# Patient Record
Sex: Male | Born: 1980 | Race: Asian | Hispanic: No | Marital: Married | Smoking: Current every day smoker
Health system: Southern US, Community
[De-identification: ages and names within clinical notes are randomized; demographics above are authoritative.]

## PROBLEM LIST (undated history)

## (undated) ENCOUNTER — Emergency Department (HOSPITAL_COMMUNITY): Admission: EM | Source: Home / Self Care

## (undated) VITALS — BP 114/66 | HR 59 | Temp 98.1°F | Resp 16

## (undated) DIAGNOSIS — F329 Major depressive disorder, single episode, unspecified: Secondary | ICD-10-CM

## (undated) DIAGNOSIS — F419 Anxiety disorder, unspecified: Secondary | ICD-10-CM

## (undated) DIAGNOSIS — F32A Depression, unspecified: Secondary | ICD-10-CM

## (undated) DIAGNOSIS — A159 Respiratory tuberculosis unspecified: Secondary | ICD-10-CM

## (undated) DIAGNOSIS — F99 Mental disorder, not otherwise specified: Secondary | ICD-10-CM

## (undated) DIAGNOSIS — Z5900 Homelessness unspecified: Secondary | ICD-10-CM

## (undated) DIAGNOSIS — F209 Schizophrenia, unspecified: Secondary | ICD-10-CM

---
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
## 2002-06-21 ENCOUNTER — Emergency Department (HOSPITAL_COMMUNITY): Admission: EM | Admit: 2002-06-21 | Discharge: 2002-06-21 | Payer: Self-pay | Admitting: Emergency Medicine

## 2010-08-20 ENCOUNTER — Emergency Department (HOSPITAL_COMMUNITY)
Admission: EM | Admit: 2010-08-20 | Discharge: 2010-08-20 | Disposition: A | Discharge status: Home / Self Care | Payer: Medicare Other | Attending: Emergency Medicine | Admitting: Emergency Medicine

## 2010-08-20 DIAGNOSIS — F329 Major depressive disorder, single episode, unspecified: Secondary | ICD-10-CM | POA: Insufficient documentation

## 2010-08-20 DIAGNOSIS — F209 Schizophrenia, unspecified: Secondary | ICD-10-CM | POA: Insufficient documentation

## 2010-08-20 DIAGNOSIS — F3289 Other specified depressive episodes: Secondary | ICD-10-CM | POA: Insufficient documentation

## 2010-08-20 DIAGNOSIS — F22 Delusional disorders: Secondary | ICD-10-CM | POA: Insufficient documentation

## 2010-08-20 DIAGNOSIS — F29 Unspecified psychosis not due to a substance or known physiological condition: Secondary | ICD-10-CM | POA: Insufficient documentation

## 2010-08-20 LAB — RAPID URINE DRUG SCREEN, HOSP PERFORMED
Amphetamines: NOT DETECTED
Benzodiazepines: NOT DETECTED
Tetrahydrocannabinol: NOT DETECTED

## 2010-08-20 LAB — COMPREHENSIVE METABOLIC PANEL
ALT: 59 U/L — ABNORMAL HIGH (ref 0–53)
AST: 34 U/L (ref 0–37)
Alkaline Phosphatase: 44 U/L (ref 39–117)
CO2: 26 mEq/L (ref 19–32)
Calcium: 9.4 mg/dL (ref 8.4–10.5)
GFR calc Af Amer: >60 mL/min (ref >60)
Glucose, Bld: 92 mg/dL (ref 70–99)
Potassium: 4 mEq/L (ref 3.5–5.1)
Sodium: 140 mEq/L (ref 135–145)
Total Protein: 7.6 g/dL (ref 6.0–8.3)

## 2010-08-20 LAB — CBC
HCT: 41.9 % (ref 39.0–52.0)
Hemoglobin: 14 g/dL (ref 13.0–17.0)
MCHC: 33.4 g/dL (ref 30.0–36.0)
RDW: 12.7 % (ref 11.5–15.5)
WBC: 5.5 10*3/uL (ref 4.0–10.5)

## 2010-08-20 LAB — DIFFERENTIAL
Basophils Absolute: 0 10*3/uL (ref 0.0–0.1)
Basophils Relative: 1 % (ref 0–1)
Lymphocytes Relative: 29 % (ref 12–46)
Monocytes Absolute: 0.5 10*3/uL (ref 0.1–1.0)
Neutro Abs: 3.2 10*3/uL (ref 1.7–7.7)

## 2012-09-11 ENCOUNTER — Encounter (HOSPITAL_COMMUNITY): Payer: Self-pay | Admitting: Emergency Medicine

## 2012-09-11 ENCOUNTER — Emergency Department (HOSPITAL_COMMUNITY)
Admission: EM | Admit: 2012-09-11 | Discharge: 2012-09-11 | Disposition: A | Discharge status: Home / Self Care | Payer: Medicare Other | Attending: Emergency Medicine | Admitting: Emergency Medicine

## 2012-09-11 DIAGNOSIS — F489 Nonpsychotic mental disorder, unspecified: Secondary | ICD-10-CM | POA: Insufficient documentation

## 2012-09-11 DIAGNOSIS — Z79899 Other long term (current) drug therapy: Secondary | ICD-10-CM | POA: Insufficient documentation

## 2012-09-11 DIAGNOSIS — N390 Urinary tract infection, site not specified: Secondary | ICD-10-CM | POA: Insufficient documentation

## 2012-09-11 DIAGNOSIS — R3915 Urgency of urination: Secondary | ICD-10-CM | POA: Insufficient documentation

## 2012-09-11 DIAGNOSIS — R300 Dysuria: Secondary | ICD-10-CM | POA: Insufficient documentation

## 2012-09-11 HISTORY — DX: Mental disorder, not otherwise specified: F99

## 2012-09-11 LAB — URINALYSIS, ROUTINE W REFLEX MICROSCOPIC
Bilirubin Urine: NEGATIVE
Ketones, ur: NEGATIVE mg/dL
Specific Gravity, Urine: 1.022 (ref 1.005–1.030)
pH: 6 (ref 5.0–8.0)

## 2012-09-11 LAB — BASIC METABOLIC PANEL
BUN: 15 mg/dL (ref 6–23)
Creatinine, Ser: 1 mg/dL (ref 0.50–1.35)
GFR calc non Af Amer: >90 mL/min (ref >90)
Glucose, Bld: 100 mg/dL — ABNORMAL HIGH (ref 70–99)
Potassium: 3.4 mEq/L — ABNORMAL LOW (ref 3.5–5.1)

## 2012-09-11 LAB — CBC WITH DIFFERENTIAL/PLATELET
Eosinophils Absolute: 0.1 10*3/uL (ref 0.0–0.7)
Eosinophils Relative: 1 % (ref 0–5)
HCT: 39.6 % (ref 39.0–52.0)
Hemoglobin: 13.1 g/dL (ref 13.0–17.0)
Lymphs Abs: 0.6 10*3/uL — ABNORMAL LOW (ref 0.7–4.0)
MCH: 26.6 pg (ref 26.0–34.0)
MCHC: 33.1 g/dL (ref 30.0–36.0)
MCV: 80.5 fL (ref 78.0–100.0)
Monocytes Absolute: 1.3 10*3/uL — ABNORMAL HIGH (ref 0.1–1.0)
Monocytes Relative: 10 % (ref 3–12)
Neutrophils Relative %: 85 % — ABNORMAL HIGH (ref 43–77)
RBC: 4.92 MIL/uL (ref 4.22–5.81)

## 2012-09-11 LAB — URINE MICROSCOPIC-ADD ON

## 2012-09-11 MED ORDER — CIPROFLOXACIN HCL 500 MG PO TABS: 500.0000 mg | ORAL_TABLET | Freq: Two times a day (BID) | ORAL | Status: DC

## 2012-09-11 MED ORDER — CIPROFLOXACIN HCL 500 MG PO TABS
500.0000 mg | ORAL_TABLET | Freq: Once | ORAL | Status: AC
  Administered 2012-09-11: 500 mg via ORAL
  Filled 2012-09-11: qty 1

## 2012-09-11 MED ORDER — PHENAZOPYRIDINE HCL 200 MG PO TABS: 200.0000 mg | ORAL_TABLET | Freq: Three times a day (TID) | ORAL | Status: DC

## 2012-09-11 NOTE — ED Provider Notes (Signed)
History     CSN: [id number]  Arrival date & time 09/11/12  0621   First MD Initiated Contact with Patient 09/11/12 [phone]      Chief Complaint  Patient presents with  . Abdominal Pain    (Consider location/radiation/quality/duration/timing/severity/associated sxs/prior treatment) Patient is a 32 y.o. male presenting with abdominal pain. The history is provided by the patient.  Abdominal Pain  patient here complaining of dysuria and suprapubic abdominal pain which has since resolved. Symptoms started 3 days ago. No flank pain or fever or vomiting. No prior history of same. Nothing makes her symptoms better. No associated back pain. Patient also notes some urinary urgency.  Past Medical History  Diagnosis Date  . Mental disorder     History reviewed. No pertinent past surgical history.  No family history on file.  History  Substance Use Topics  . Smoking status: Not on file  . Smokeless tobacco: Not on file  . Alcohol Use: No      Review of Systems  Gastrointestinal: Positive for abdominal pain.  All other systems reviewed and are negative.    Allergies  Review of patient's allergies indicates no known allergies.  Home Medications   Current Outpatient Rx  Name  Route  Sig  Dispense  Refill  . acetaminophen (TYLENOL) 500 MG tablet   Oral   Take 1,000 mg by mouth every 6 (six) hours as needed for pain.         . benztropine (COGENTIN) 2 MG tablet   Oral   Take 2 mg by mouth 2 (two) times daily.         Marland Kitchen gabapentin (NEURONTIN) 100 MG capsule   Oral   Take 100 mg by mouth 3 (three) times daily.         . ziprasidone (GEODON) 80 MG capsule   Oral   Take 80 mg by mouth 2 (two) times daily with a meal.           BP 111/57  Pulse 94  Temp(Src) 98.4 F (36.9 C) (Oral)  Resp 16  Wt 160 lb (72.576 kg)  SpO2 99%  Physical Exam  Nursing note and vitals reviewed. Constitutional: He is oriented to person, place, and time. He appears well-developed  and well-nourished.  Non-toxic appearance. No distress.  HENT:  Head: Normocephalic and atraumatic.  Eyes: Conjunctivae, EOM and lids are normal. Pupils are equal, round, and reactive to light.  Neck: Normal range of motion. Neck supple. No tracheal deviation present. No mass present.  Cardiovascular: Normal rate, regular rhythm and normal heart sounds.  Exam reveals no gallop.   No murmur heard. Pulmonary/Chest: Effort normal and breath sounds normal. No stridor. No respiratory distress. He has no decreased breath sounds. He has no wheezes. He has no rhonchi. He has no rales.  Abdominal: Soft. Normal appearance and bowel sounds are normal. He exhibits no distension. There is no tenderness. There is no rigidity, no rebound, no guarding and no CVA tenderness.  Musculoskeletal: Normal range of motion. He exhibits no edema and no tenderness.  Neurological: He is alert and oriented to person, place, and time. He has normal strength. No cranial nerve deficit or sensory deficit. GCS eye subscore is 4. GCS verbal subscore is 5. GCS motor subscore is 6.  Skin: Skin is warm and dry. No abrasion and no rash noted.  Psychiatric: His speech is normal and behavior is normal. His affect is blunt.    ED Course  Procedures (including critical care  time)  Labs Reviewed  URINALYSIS, ROUTINE W REFLEX MICROSCOPIC  CBC WITH DIFFERENTIAL  BASIC METABOLIC PANEL   No results found.   No diagnosis found.    MDM  Pt to be tx for his uti        Toy Baker, MD 09/11/12 [phone]

## 2012-09-11 NOTE — ED Notes (Signed)
Pt alert, arrives from home, c/o left lower abd, "stinky bowel movements", states pain occurs after bowel movement, onset the other day, resp even unlabored, skin pwd

## 2012-09-13 LAB — URINE CULTURE: Colony Count: >=100000

## 2012-09-15 NOTE — ED Notes (Signed)
+   Urine Patient treated with cipro-sensitive to same-chart appended per protocol MD. 

## 2013-12-29 ENCOUNTER — Emergency Department (HOSPITAL_COMMUNITY)
Admission: EM | Admit: 2013-12-29 | Discharge: 2013-12-29 | Disposition: A | Discharge status: Home / Self Care | Payer: Medicare Other

## 2013-12-29 NOTE — ED Notes (Signed)
Pt told registration that he is feeling better and is going to go home  Will return if needed

## 2014-07-30 DIAGNOSIS — F209 Schizophrenia, unspecified: Secondary | ICD-10-CM | POA: Diagnosis not present

## 2014-08-06 DIAGNOSIS — F209 Schizophrenia, unspecified: Secondary | ICD-10-CM | POA: Diagnosis not present

## 2014-10-16 DIAGNOSIS — E669 Obesity, unspecified: Secondary | ICD-10-CM | POA: Diagnosis not present

## 2014-10-16 DIAGNOSIS — E784 Other hyperlipidemia: Secondary | ICD-10-CM | POA: Diagnosis not present

## 2014-10-16 DIAGNOSIS — F209 Schizophrenia, unspecified: Secondary | ICD-10-CM | POA: Diagnosis not present

## 2014-10-16 DIAGNOSIS — E785 Hyperlipidemia, unspecified: Secondary | ICD-10-CM | POA: Diagnosis not present

## 2014-10-16 DIAGNOSIS — Z79899 Other long term (current) drug therapy: Secondary | ICD-10-CM | POA: Diagnosis not present

## 2014-10-16 DIAGNOSIS — R7302 Impaired glucose tolerance (oral): Secondary | ICD-10-CM | POA: Diagnosis not present

## 2014-11-19 DIAGNOSIS — F209 Schizophrenia, unspecified: Secondary | ICD-10-CM | POA: Diagnosis not present

## 2014-11-22 DIAGNOSIS — F209 Schizophrenia, unspecified: Secondary | ICD-10-CM | POA: Diagnosis not present

## 2014-12-14 DIAGNOSIS — F209 Schizophrenia, unspecified: Secondary | ICD-10-CM | POA: Diagnosis not present

## 2015-02-11 DIAGNOSIS — F209 Schizophrenia, unspecified: Secondary | ICD-10-CM | POA: Diagnosis not present

## 2015-02-25 DIAGNOSIS — Z79899 Other long term (current) drug therapy: Secondary | ICD-10-CM | POA: Diagnosis not present

## 2015-02-25 DIAGNOSIS — E785 Hyperlipidemia, unspecified: Secondary | ICD-10-CM | POA: Diagnosis not present

## 2015-02-25 DIAGNOSIS — F209 Schizophrenia, unspecified: Secondary | ICD-10-CM | POA: Diagnosis not present

## 2015-02-25 DIAGNOSIS — R7302 Impaired glucose tolerance (oral): Secondary | ICD-10-CM | POA: Diagnosis not present

## 2015-02-25 DIAGNOSIS — E669 Obesity, unspecified: Secondary | ICD-10-CM | POA: Diagnosis not present

## 2015-03-13 DIAGNOSIS — F209 Schizophrenia, unspecified: Secondary | ICD-10-CM | POA: Diagnosis not present

## 2015-03-26 ENCOUNTER — Encounter (HOSPITAL_COMMUNITY): Payer: Self-pay | Admitting: Emergency Medicine

## 2015-03-26 ENCOUNTER — Emergency Department (INDEPENDENT_AMBULATORY_CARE_PROVIDER_SITE_OTHER)
Admission: EM | Admit: 2015-03-26 | Discharge: 2015-03-26 | Disposition: A | Discharge status: Home / Self Care | Payer: Medicare Other | Source: Home / Self Care | Attending: Emergency Medicine | Admitting: Emergency Medicine

## 2015-03-26 DIAGNOSIS — M25561 Pain in right knee: Secondary | ICD-10-CM

## 2015-03-26 NOTE — Discharge Instructions (Signed)
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 This will likely continue to improve over the next 2 weeks. You can ice the knee or use ibuprofen as needed. If you are still having popping in your knee in 2-3 weeks, follow-up with sports medicine.

## 2015-03-26 NOTE — ED Notes (Signed)
C/o right knee pain onset 2 weeks... 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 No acute distress.

## 2015-03-26 NOTE — ED Provider Notes (Signed)
CSN: [id number]     Arrival date & time 03/26/15  1330 History   First MD Initiated Contact with Patient 03/26/15 1433     Chief Complaint  Patient presents with  . Knee Pain   (Consider location/radiation/quality/duration/timing/severity/associated sxs/prior Treatment) HPI  He is a 34 year old man here for evaluation of right knee pain. 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 He describes lifting a TV with all the pressure on his right leg. After this, he started having some discomfort in the right knee. This has gradually improved. Currently, he only describes discomfort first thing in the morning or if he has been sitting for a long time. He states in those situations when he flexes and extends his knee he will feel a pop in the front of the knee and have some brief discomfort. He denies any pain with walking. No swelling of the knee. He has been taking ibuprofen as needed with good improvement.  Past Medical History  Diagnosis Date  . Mental disorder    History reviewed. No pertinent past surgical history. No family history on file. Social History  Substance Use Topics  . Smoking status: Never Smoker   . Smokeless tobacco: None  . Alcohol Use: No    Review of Systems As in history of present illness Allergies  Review of patient's allergies indicates no known allergies.  Home Medications   Prior to Admission medications   Medication Sig Start Date End Date Taking? Authorizing Provider  benztropine (COGENTIN) 2 MG tablet Take 2 mg by mouth 2 (two) times daily.   Yes Historical Provider, MD  gabapentin (NEURONTIN) 100 MG capsule Take 100 mg by mouth 3 (three) times daily.   Yes Historical Provider, MD  ziprasidone (GEODON) 80 MG capsule Take 80 mg by mouth 2 (two) times daily with a meal.   Yes Historical Provider, MD  acetaminophen (TYLENOL) 500 MG tablet Take 1,000 mg by mouth every 6 (six) hours as needed for pain.     Historical Provider, MD   Meds Ordered and Administered this Visit  Medications - No data to display  BP 108/66 mmHg  Pulse 62  Temp(Src) 98 F (36.7 C) (Oral)  Resp 16  SpO2 97% No data found.   Physical Exam  Constitutional: He is oriented to person, place, and time. He appears well-developed and well-nourished. No distress.  Cardiovascular: Normal rate.   Pulmonary/Chest: Effort normal.  Musculoskeletal:  Right knee: No erythema or edema. No appreciable joint effusion. No tenderness of the patella, patellar tendon, quadriceps tendon, medial joint line, or lateral joint line. No joint laxity. Negative McMurray's. No pain with manipulation of the patella.  Neurological: He is alert and oriented to person, place, and time.    ED Course  Procedures (including critical care time)  Labs Review Labs Reviewed - No data to display  Imaging Review No results found.    MDM   1. Right knee pain    I suspect he either subluxed or otherwise injured the patellofemoral compartment. This has been improving. I suspect he will continue to improve over the next several weeks. Recommended ice or ibuprofen as needed. If things are not getting better in the next few weeks, he will follow-up with sports medicine.    Charm Rings, MD 03/26/15 [phone]

## 2015-04-02 ENCOUNTER — Inpatient Hospital Stay (HOSPITAL_COMMUNITY)
Admission: AD | Admit: 2015-04-02 | Discharge: 2015-04-08 | DRG: 885 | Disposition: A | Discharge status: Home / Self Care | Payer: Medicare Other | Attending: Psychiatry | Admitting: Psychiatry

## 2015-04-02 ENCOUNTER — Encounter (HOSPITAL_COMMUNITY): Payer: Self-pay | Admitting: *Deleted

## 2015-04-02 DIAGNOSIS — F1021 Alcohol dependence, in remission: Secondary | ICD-10-CM | POA: Diagnosis present

## 2015-04-02 DIAGNOSIS — F29 Unspecified psychosis not due to a substance or known physiological condition: Secondary | ICD-10-CM | POA: Diagnosis present

## 2015-04-02 DIAGNOSIS — F1221 Cannabis dependence, in remission: Secondary | ICD-10-CM | POA: Diagnosis present

## 2015-04-02 DIAGNOSIS — R45851 Suicidal ideations: Secondary | ICD-10-CM | POA: Diagnosis present

## 2015-04-02 DIAGNOSIS — F1290 Cannabis use, unspecified, uncomplicated: Secondary | ICD-10-CM | POA: Diagnosis not present

## 2015-04-02 DIAGNOSIS — F203 Undifferentiated schizophrenia: Secondary | ICD-10-CM | POA: Diagnosis not present

## 2015-04-02 DIAGNOSIS — F1099 Alcohol use, unspecified with unspecified alcohol-induced disorder: Secondary | ICD-10-CM | POA: Diagnosis not present

## 2015-04-02 HISTORY — DX: Major depressive disorder, single episode, unspecified: F32.9

## 2015-04-02 HISTORY — DX: Depression, unspecified: F32.A

## 2015-04-02 HISTORY — DX: Anxiety disorder, unspecified: F41.9

## 2015-04-02 LAB — CBC
HEMATOCRIT: 42 % (ref 39.0–52.0)
HEMOGLOBIN: 13.8 g/dL (ref 13.0–17.0)
MCH: 26.3 pg (ref 26.0–34.0)
MCHC: 32.9 g/dL (ref 30.0–36.0)
MCV: 80 fL (ref 78.0–100.0)
Platelets: 276 10*3/uL (ref 150–400)
RBC: 5.25 MIL/uL (ref 4.22–5.81)
RDW: 13.1 % (ref 11.5–15.5)
WBC: 8 10*3/uL (ref 4.0–10.5)

## 2015-04-02 LAB — COMPREHENSIVE METABOLIC PANEL
ALBUMIN: 5 g/dL (ref 3.5–5.0)
ALK PHOS: 52 U/L (ref 38–126)
ALT: 42 U/L (ref 17–63)
AST: 37 U/L (ref 15–41)
Anion gap: 9 (ref 5–15)
BILIRUBIN TOTAL: 0.6 mg/dL (ref 0.3–1.2)
BUN: 18 mg/dL (ref 6–20)
CALCIUM: 9.9 mg/dL (ref 8.9–10.3)
CO2: 28 mmol/L (ref 22–32)
Chloride: 102 mmol/L (ref 101–111)
Creatinine, Ser: 1.31 mg/dL — ABNORMAL HIGH (ref 0.61–1.24)
GFR calc Af Amer: >60 mL/min (ref >60)
GFR calc non Af Amer: >60 mL/min (ref >60)
Glucose, Bld: 88 mg/dL (ref 65–99)
Potassium: 4.4 mmol/L (ref 3.5–5.1)
Sodium: 139 mmol/L (ref 135–145)
TOTAL PROTEIN: 8.8 g/dL — AB (ref 6.5–8.1)

## 2015-04-02 LAB — RAPID URINE DRUG SCREEN, HOSP PERFORMED
Amphetamines: NOT DETECTED
BARBITURATES: NOT DETECTED
Benzodiazepines: NOT DETECTED
Cocaine: NOT DETECTED
Opiates: NOT DETECTED
Tetrahydrocannabinol: NOT DETECTED

## 2015-04-02 LAB — ETHANOL: Alcohol, Ethyl (B): <5 mg/dL (ref <5)

## 2015-04-02 MED ORDER — HYDROXYZINE HCL 25 MG PO TABS
25.0000 mg | ORAL_TABLET | Freq: Four times a day (QID) | ORAL | Status: DC | PRN
  Administered 2015-04-02 – 2015-04-03 (×2): 25 mg via ORAL
  Filled 2015-04-02 (×2): qty 1

## 2015-04-02 MED ORDER — ACETAMINOPHEN 325 MG PO TABS: 650.0000 mg | ORAL_TABLET | Freq: Four times a day (QID) | ORAL | Status: DC | PRN

## 2015-04-02 MED ORDER — INFLUENZA VAC SPLIT QUAD 0.5 ML IM SUSY
0.5000 mL | PREFILLED_SYRINGE | INTRAMUSCULAR | Status: AC
  Administered 2015-04-03: 0.5 mL via INTRAMUSCULAR
  Filled 2015-04-02: qty 0.5

## 2015-04-02 MED ORDER — OLANZAPINE 5 MG PO TBDP: 5.0000 mg | ORAL_TABLET | Freq: Three times a day (TID) | ORAL | Status: DC | PRN

## 2015-04-02 MED ORDER — MAGNESIUM HYDROXIDE 400 MG/5ML PO SUSP: 30.0000 mL | Freq: Every day | ORAL | Status: DC | PRN

## 2015-04-02 MED ORDER — TRAZODONE HCL 100 MG PO TABS
100.0000 mg | ORAL_TABLET | Freq: Every evening | ORAL | Status: DC | PRN
  Administered 2015-04-03 – 2015-04-04 (×2): 100 mg via ORAL
  Filled 2015-04-02 (×2): qty 1

## 2015-04-02 MED ORDER — ALUM & MAG HYDROXIDE-SIMETH 200-200-20 MG/5ML PO SUSP: 30.0000 mL | ORAL | Status: DC | PRN

## 2015-04-02 NOTE — Tx Team (Signed)
Initial Interdisciplinary Treatment Plan   PATIENT STRESSORS: Educational concerns Financial difficulties Marital or family conflict Medication change or noncompliance Occupational concerns Traumatic event   PATIENT STRENGTHS: Ability for insight Average or above average intelligence Capable of independent living Communication skills General fund of knowledge Motivation for treatment/growth Physical Health Religious Affiliation Supportive family/friends Work skills   PROBLEM LIST: Problem List/Patient Goals Date to be addressed Date deferred Reason deferred Estimated date of resolution  "suicidal thoughts" 04/02/2015   D/c  "panic" 04/02/2015   D/c  "depression" 04/02/2015   D/c  "anxiety" 04/02/2015   D/c                                 DISCHARGE CRITERIA:  Ability to meet basic life and health needs Improved stabilization in mood, thinking, and/or behavior Medical problems require only outpatient monitoring Motivation to continue treatment in a less acute level of care Need for constant or close observation no longer present Reduction of life-threatening or endangering symptoms to within safe limits Safe-care adequate arrangements made Verbal commitment to aftercare and medication compliance  PRELIMINARY DISCHARGE PLAN: Attend aftercare/continuing care group Attend PHP/IOP Outpatient therapy Participate in family therapy Return to previous living arrangement Return to previous work or school arrangements  PATIENT/FAMIILY INVOLVEMENT: This treatment plan has been presented to and reviewed with the patient, [name] [name].  The patient and family have been given the opportunity to ask questions and make suggestions.  Earline Mayotte 04/02/2015, 4:16 PM

## 2015-04-02 NOTE — Progress Notes (Signed)
Patient ID: [name] [name], male   DOB: [birth year], 34 y.o.   MRN: [id number] PER STATE REGULATIONS 482.30  THIS CHART WAS REVIEWED FOR MEDICAL NECESSITY WITH RESPECT TO THE PATIENT'S ADMISSION/DURATION OF STAY.  NEXT REVIEW DATE:04/06/15  Loura Halt, RN, BSN CASE MANAGER

## 2015-04-02 NOTE — Progress Notes (Signed)
Patient ID: [name] [name], male   DOB: 11-10-1980, 34 y.o.   MRN: [id number] Showering once on unit. He is anxious and states he is in a hurry and ready to get better. He states he has been feeling the way he does now for one year. He used to get help from outpatient services but hasnt had any in over one year. Reassured we were here to help him but getting better would take some time. He is pleasant and cooperative, and has an animated affect. Provided him items to shower with. He is able to contract for safety. Offered him Vistaril for anxiety and he took it with med ed.

## 2015-04-02 NOTE — Progress Notes (Addendum)
34 yr old male voluntary walk in.  Patient stated he was a patient at [facility] in 2001 and 2002.  Patient stated he drank alcohol once in awhile when he was 20-13 yrs old.  No alcohol since 2003, beer, cheap wine, liquor which was bought by friends.  Stated he was in jail a lot.  Stopped going to jail, reads his Bible, prays, started taking his medications.  Keeps to himself now.  THC use when he was 51-13 yrs old, no THC since age 54 yrs old.  Denied heroin use.  Cocaine long time ago, only taste cocaine.  Started smoking cigarettes at age [age] yrs until age of 70 yrs, no smoking for 11 yrs.  Denied all abuse.  Stated he had cholesterol problems in the past.  R knee pain occasionally, old work injury.  Works at [employer] since 2013, received disability check SSI.  Rated depression 3-4, anxiety 2, hopeless 5.  Hears voices occasionally, makes him feel so bad, think bad, wants to end his life, "wife may leave me, everybody against me, feel sad, angry, go off.  Want to take care of wife and family.  Go to church every Sunday, want people to pray for me, same thing over and over, normal things makes me feel suicidal, angry, sometimes want to hurt somebody, because I feel they are after me, sometimes hear things and that makes me mad.  Imagine kids cuss me, people laugh at me, try not to think about my depression.  Wife miscarried at 5 months pregnancy, about 5 months ago.  Wife sad and I was sad.  I did not hold child.  I walk away when angry.  Try to use coping skills.  Wanted to go to school, auto mechanics in 2011.  Had problems at school, low grades, problems studying, attention span.  Then took classes to manage my illness.  I'm not suicidal now but I do not have any money to buy a gun, talk to my friends that makes me feel better."  Denied HI during admission.  Denied A/V hallucinations during admission.  Denied pain.  Sometimes feels panicky, always thinks about past, would like to move to future, not be  stuck in past.  Sometimes voices in his head, voices are true.  Sees dreams and past.  Married no children.  Rated depression 3-4, anxiety 2, hopeless 5. Patient oriented to unit, given food and drink. Fall risk information given and discussed with patient, low fall risk. [employer] has black belt, blue case cell phone, keys, head phones, black wallet, gray cap, etc.

## 2015-04-02 NOTE — BH Assessment (Signed)
Assessment Note  [name] [name] is an 34 y.o. male that is a walk in at [facility].  Patient reports hearing and seeing things that scare him.  Patient reports that he was diagnosed with Schizophrenia and is compliant with taking his medication.  Patient reports that he receives medication management and therapy at [facility].  Patient reports having dreams that his sister broke his phone then he got mad at his sister and got into an argument with her.  Patient reports that he is not sure what he is dreaming is real     Patient reports feelings of paranoia.  Patient reports that he feels as if the police are after him,  Patient reports that since his wife miscarried 5 months ago he has begun to hear and see things more.  During the assessment the patient reports that he is afraid of what the voices are trying to tell him to do.  Patient declined to say what the voices are telling him to do.  Patient began tapping his head and stating that he just wants the voices to step.       Patient reports previous psychiatric hospitalizations at [facility], [facility] and [facility].  Patient denies HI and Substance Abuse.  Patient denies physical, sexual and emotional abuse.      Diagnosis:   Axis I: Paranoid Schizophrenia  Axis II: Deferred  Axis III Past Medical History:  Past Medical History  Diagnosis Date  . Mental disorder   Axis IV: economic problems, occupational problems, other psychosocial or environmental problems and problems with primary support group Axis V: GAF=30  No past surgical history on file.  Family History: No family history on file.  Social History:  reports that he has never smoked. He does not have any smokeless tobacco history on file. He reports that he does not drink alcohol or use illicit drugs.  Additional Social History:  Alcohol / Drug Use History of alcohol / drug use?: No history of alcohol / drug abuse  CIWA:   COWS:    Allergies: No Known Allergies  Home Medications:   (Not in a hospital admission)  OB/GYN Status:  No LMP for male patient.  General Assessment Data Location of Assessment:  (Walk In at [facility] ) TTS Assessment: In system Is this a Tele or Face-to-Face Assessment?: Face-to-Face Is this an Initial Assessment or a Re-assessment for this encounter?: Initial Assessment Marital status: Single Maiden name: NA Is patient pregnant?: No Pregnancy Status: No Living Arrangements: Spouse/significant other Can pt return to current living arrangement?: Yes Admission Status: Voluntary Is patient capable of signing voluntary admission?: Yes Referral Source: Self/Family/Friend Insurance type: Medicare  Medical Screening Exam [facility] Walk-in ONLY) Medical Exam completed: Yes  Crisis Care Plan Living Arrangements: Spouse/significant other Name of Psychiatrist: Vesta Mixer Name of Therapist: [facility]   Education Status Is patient currently in school?: No Current Grade: NA Highest grade of school patient has completed: NA Name of school: NA Contact person: NA  Risk to self with the past 6 months Suicidal Ideation: Yes-Currently Present Has patient been a risk to self within the past 6 months prior to admission? : Yes Suicidal Intent: No Has patient had any suicidal intent within the past 6 months prior to admission? : Yes Is patient at risk for suicide?: Yes Suicidal Plan?: No Has patient had any suicidal plan within the past 6 months prior to admission? : Yes Access to Means: No What has been your use of drugs/alcohol within the last 12  months?: None Reported Previous Attempts/Gestures: Yes How many times?: 3 Other Self Harm Risks: None Reported Triggers for Past Attempts: Other (Comment) (Command voices) Intentional Self Injurious Behavior: None Family Suicide History: No Recent stressful life event(s): Loss (Comment) (Wife lost his baby.) Persecutory voices/beliefs?: Yes Depression: Yes Depression Symptoms: Despondent, Tearfulness, Fatigue,  Guilt, Loss of interest in usual pleasures Substance abuse history and/or treatment for substance abuse?: No Suicide prevention information given to non-admitted patients: Yes  Risk to Others within the past 6 months Homicidal Ideation: No Does patient have any lifetime risk of violence toward others beyond the six months prior to admission? : No Thoughts of Harm to Others: No Current Homicidal Intent: No Current Homicidal Plan: No Access to Homicidal Means: No Identified Victim: NA History of harm to others?: No Assessment of Violence: None Noted Violent Behavior Description: NA Does patient have access to weapons?: No Criminal Charges Pending?: No Does patient have a court date: No Is patient on probation?: No  Psychosis Hallucinations: Auditory, Visual Delusions: Grandiose (Believes that people are following her. )  Mental Status Report Appearance/Hygiene: Disheveled Eye Contact: Fair Motor Activity: Freedom of movement Speech: Logical/coherent Level of Consciousness: Alert Mood: Depressed, Anxious, Suspicious, Fearful Affect: Frightened, Fearful, Blunted Anxiety Level: Moderate Thought Processes: Coherent, Relevant Judgement: Unimpaired Orientation: Place, Person, Time, Situation Obsessive Compulsive Thoughts/Behaviors: None  Cognitive Functioning Concentration: Decreased Memory: Remote Intact, Recent Intact IQ: Average Level of Function: NA Insight: Poor Impulse Control: Poor Appetite: Fair Weight Loss: 0 Weight Gain: 0 Sleep: Decreased Total Hours of Sleep: 4 Vegetative Symptoms: None  ADLScreening [facility] Assessment Services) Patient's cognitive ability adequate to safely complete daily activities?: Yes Patient able to express need for assistance with ADLs?: Yes Independently performs ADLs?: Yes (appropriate for developmental age)  Prior Inpatient Therapy Prior Inpatient Therapy: Yes Prior Therapy Dates: 2001, 2003, 2005 Prior Therapy  Facilty/Provider(s): [facility]; [facility]; [facility] Reason for Treatment: Psychosis; SI  Prior Outpatient Therapy Prior Outpatient Therapy: Yes Prior Therapy Dates: Ongoing  Prior Therapy Facilty/Provider(s): [facility] Reason for Treatment: Medication Management Does patient have an ACCT team?: No Does patient have Intensive In-House Services?  : No Does patient have [facility] services? : No Does patient have P4CC services?: No  ADL Screening (condition at time of admission) Patient's cognitive ability adequate to safely complete daily activities?: Yes Is the patient deaf or have difficulty hearing?: No Does the patient have difficulty seeing, even when wearing glasses/contacts?: No Does the patient have difficulty concentrating, remembering, or making decisions?: Yes Patient able to express need for assistance with ADLs?: Yes Does the patient have difficulty dressing or bathing?: No Independently performs ADLs?: Yes (appropriate for developmental age) Does the patient have difficulty walking or climbing stairs?: No Weakness of Legs: None Weakness of Arms/Hands: None  Home Assistive Devices/Equipment Home Assistive Devices/Equipment: None    Abuse/Neglect Assessment (Assessment to be complete while patient is alone) Physical Abuse: Denies Verbal Abuse: Denies Sexual Abuse: Denies Exploitation of patient/patient's resources: Denies Self-Neglect: Denies Values / Beliefs Cultural Requests During Hospitalization: None Spiritual Requests During Hospitalization: None Consults Spiritual Care Consult Needed: No Social Work Consult Needed: No Merchant navy officer (For Healthcare) Does patient have an advance directive?: No Would patient like information on creating an advanced directive?: No - patient declined information    Additional Information 1:1 In Past 12 Months?: No CIRT Risk: No Elopement Risk: No     Disposition:  Disposition Initial Assessment Completed for this  Encounter: Yes Disposition of Patient: Inpatient treatment program Type  of inpatient treatment program: Adult  On Site Evaluation by:   Reviewed with Physician:    Phillip Heal LaVerne 04/02/2015 1:40 PM

## 2015-04-02 NOTE — Progress Notes (Signed)
D   Pt is pleasant on approach and cooperative  Pt is very focused on his wife leaving him and about things in his past   He interacts minimally with others and did not attend group this evening A   Verbal support given   Medications administered and effectiveness monitored   Q 15 min checks R   Pt safe at present

## 2015-04-03 ENCOUNTER — Encounter (HOSPITAL_COMMUNITY): Payer: Self-pay | Admitting: Psychiatry

## 2015-04-03 DIAGNOSIS — F203 Undifferentiated schizophrenia: Secondary | ICD-10-CM | POA: Diagnosis not present

## 2015-04-03 DIAGNOSIS — F1221 Cannabis dependence, in remission: Secondary | ICD-10-CM | POA: Diagnosis present

## 2015-04-03 DIAGNOSIS — F1021 Alcohol dependence, in remission: Secondary | ICD-10-CM | POA: Diagnosis present

## 2015-04-03 DIAGNOSIS — F1099 Alcohol use, unspecified with unspecified alcohol-induced disorder: Secondary | ICD-10-CM

## 2015-04-03 MED ORDER — CITALOPRAM HYDROBROMIDE 10 MG PO TABS
10.0000 mg | ORAL_TABLET | Freq: Every day | ORAL | Status: DC
  Administered 2015-04-03 – 2015-04-05 (×3): 10 mg via ORAL
  Filled 2015-04-03 (×4): qty 1

## 2015-04-03 MED ORDER — BENZTROPINE MESYLATE 0.5 MG PO TABS
0.5000 mg | ORAL_TABLET | Freq: Every day | ORAL | Status: DC
  Administered 2015-04-04 – 2015-04-07 (×5): 0.5 mg via ORAL
  Filled 2015-04-03 (×7): qty 1

## 2015-04-03 MED ORDER — ARIPIPRAZOLE 5 MG PO TABS
2.5000 mg | ORAL_TABLET | Freq: Once | ORAL | Status: AC
  Administered 2015-04-03: 2.5 mg via ORAL
  Filled 2015-04-03: qty 1

## 2015-04-03 MED ORDER — ARIPIPRAZOLE 5 MG PO TABS
5.0000 mg | ORAL_TABLET | Freq: Every day | ORAL | Status: DC
  Administered 2015-04-04: 5 mg via ORAL
  Filled 2015-04-03 (×3): qty 1

## 2015-04-03 NOTE — [facility] Group Notes (Signed)
[facility] LCSW Group Therapy  04/03/2015 3:15 PM  Type of Therapy: Group Therapy  Participation Level: Active  Participation Quality: Attentive  Affect: Flat  Cognitive: Oriented  Insight: Limited  Engagement in Therapy: Engaged  Modes of Intervention: Discussion and Socialization  Summary of Progress/Problems: [name] [name] from [facility] [employer] was here to tell his story of recovery and play his guitar. Pt was alert and pleasant throughout the duration of the group.  Vito Backers. Beverely Pace 04/03/2015 3:15 PM

## 2015-04-03 NOTE — Progress Notes (Signed)
Adult Psychoeducational Group Note  Date:  04/03/2015 Time:  8:25 PM  Group Topic/Focus:  Wrap-Up Group:   The focus of this group is to help patients review their daily goal of treatment and discuss progress on daily workbooks.  Participation Level:  Active  Participation Quality:  Appropriate  Affect:  Appropriate  Cognitive:  Appropriate  Insight: Good  Engagement in Group:  Engaged  Modes of Intervention:  Discussion  Additional Comments:  Pt rated overall day a 7 out of 10. Pt reported that his family visiting him today was the highlight of his day. Pt mentioned that he did not eat very much today. Pt also reported that his goal for the day was to take his medication, which he feels that he achieved.   Cleotilde Neer 04/03/2015, 9:56 PM

## 2015-04-03 NOTE — Progress Notes (Signed)
D   Pt is pleasant on approach and cooperative  Pt is very focused on his wife leaving him and about things in his past   He interacts minimally with others and did not attend group this evening   Pt said he saw designs behind his eyelids when he closed them when he was in his room and complained of feeling a little dizzy A   Verbal support given   Medications administered and effectiveness monitored   Q 15 min checks  Provided gator aid for pt to help with dizziness  Reinforced fall precautions R   Pt safe at present   Pt verbalized understanding

## 2015-04-03 NOTE — [facility] Group Notes (Signed)
[facility] LCSW Aftercare Discharge Planning Group Note   04/03/2015 11:47 AM  Participation Quality:  Engaged  Mood/Affect:  Blunted  Depression Rating:  "high"  Anxiety Rating:  "high"  Thoughts of Suicide:  No Will you contract for safety?   NA  Current AVH:  Yes  Plan for Discharge/Comments:  Earon talked about how he was hoping to be healed at church, and as a result decided to stop taking his meds.  Rather difficult to follow.  He is married and employed at [employer].  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    Transportation Means: family  Supports: family  [place], Baldo Daub

## 2015-04-03 NOTE — H&P (Signed)
Psychiatric Admission Assessment Adult  Patient Identification: [name] [name] MRN:  [id number] Date of Evaluation:  04/03/2015 Chief Complaint: " I am thinking a lot.'   Principal Diagnosis: Undifferentiated schizophrenia Diagnosis:   Patient Active Problem List   Diagnosis Date Noted  . Undifferentiated schizophrenia [F20.3] 04/03/2015  . Cannabis use disorder, moderate, in sustained remission [F12.90] 04/03/2015  . Alcohol use disorder, moderate, in sustained remission [F10.99] 04/03/2015     History of Present Illness::[name] [name] is a 34 y.o.Asian male, married , on SSD , has a past hx of schizophrenia , presented as a walk in at [facility] for worsening sx. Patient per initial evaluation in EHR 'reported hearing and seeing things that scare him. Patient also reported that he was diagnosed with Schizophrenia and is compliant with taking his medication. Patient reported that he receives medication management and therapy at [facility]. Patient also reported feeling paranoid. Patient reported that he feels as if the police are after him. His wife miscarried few months ago and his symptoms worsened."  Patient seen and chart reviewed.Discussed patient with treatment team. Pt seen as withdrawn , but calm. Pt reports a lot of racing thoughts and that he hears these thoughts - they are not real voices , but thoughts , very hard to explain. Pt reports that he also feels more depressed, anxious as well as has sleep issues and appetite changes. His wife had a still born baby last Nov 2015. They buried the baby who had congenital defect , and ever since then his sx has been worsening.  Pt reports being diagnosed with schizophrenia in 2001. Pt had been tried on geodon, cogentin, trazodone. He does not feel his medications are working for him any more. Pt has had multiple hospitalizations - [facility], [facility] . Pt denies any suicide attempts.  Pt reports hx of abusing cannabis /alcohol - but has been sober since  2004.  Pt reports he is on SSD and also works at [employer] off and on.    Associated Signs/Symptoms: Depression Symptoms:  anhedonia, psychomotor retardation, difficulty concentrating, anxiety, disturbed sleep, decreased appetite, (Hypo) Manic Symptoms:  denies Anxiety Symptoms:  unspecified anxiety sx Psychotic Symptoms:  Hallucinations: Auditory Paranoia, PTSD Symptoms: Negative Total Time spent with patient: 45 minutes  Past Psychiatric History: Pt reports being diagnosed with schizophrenia in 2001. Pt had been tried on geodon, cogentin, trazodone. He does not feel his medications are working for him any more. Pt has had multiple hospitalizations - [facility], [facility] . Pt denies any suicide attempts.   Risk to Self: Suicidal Ideation: Yes-Currently Present Suicidal Intent: No Is patient at risk for suicide?: Yes Suicidal Plan?: No Access to Means: No What has been your use of drugs/alcohol within the last 12 months?: None Reported How many times?: 3 Other Self Harm Risks: None Reported Triggers for Past Attempts: Other (Comment) (Command voices) Intentional Self Injurious Behavior: None Risk to Others: Homicidal Ideation: No Thoughts of Harm to Others: No Current Homicidal Intent: No Current Homicidal Plan: No Access to Homicidal Means: No Identified Victim: NA History of harm to others?: No Assessment of Violence: None Noted Violent Behavior Description: NA Does patient have access to weapons?: No Criminal Charges Pending?: No Does patient have a court date: No Prior Inpatient Therapy: Prior Inpatient Therapy: Yes Prior Therapy Dates: 2001, 2003, 2005 Prior Therapy Facilty/Provider(s): [place]; Doretha Dicks; [facility] Reason for Treatment: Psychosis; SI Prior Outpatient Therapy: Prior Outpatient Therapy: Yes Prior Therapy Dates: Ongoing  Prior Therapy Facilty/Provider(s): [facility] Reason  for Treatment: Medication Management Does patient have an ACCT team?:  No Does patient have Intensive In-House Services?  : No Does patient have [facility] services? : No Does patient have [facility] services?: No  Alcohol Screening: 1. How often do you have a drink containing alcohol?: Never 9. Have you or someone else been injured as a result of your drinking?: No 10. Has a relative or friend or a doctor or another health worker been concerned about your drinking or suggested you cut down?: No Alcohol Use Disorder Identification Test Final Score (AUDIT): 0 Brief Intervention: AUDIT score less than 7 or less-screening does not suggest unhealthy drinking-brief intervention not indicated Substance Abuse History in the last 12 months:  No. Consequences of Substance Abuse: Negative Previous Psychotropic Medications: Yes - see above for details. Psychological Evaluations: No  Past Medical History:  Past Medical History  Diagnosis Date  . Mental disorder   . Anxiety   . Depression    History reviewed. No pertinent past surgical history. Family History:  Family History  Problem Relation Age of Onset  . Mental illness Neg Hx    Family Psychiatric  History: denies mental illness /drug abuse in family.  Social History:  History  Alcohol Use No    Comment: denied all drug/alcohol use at this time     History  Drug Use No    Comment: denied all drug /alcohol use at this time    Social History   Social History  . Marital Status: Single    Spouse Name: N/A  . Number of Children: N/A  . Years of Education: N/A   Social History Main Topics  . Smoking status: Never Smoker   . Smokeless tobacco: None  . Alcohol Use: No     Comment: denied all drug/alcohol use at this time  . Drug Use: No     Comment: denied all drug /alcohol use at this time  . Sexual Activity: Yes    Birth Control/ Protection: None   Other Topics Concern  . None   Social History Narrative   Additional Social History:    Pain Medications: tylenol  Prescriptions: cogentin   neurotin    geodon Over the Counter: tylenol History of alcohol / drug use?: Yes Longest period of sobriety (when/how long): 13 years Negative Consequences of Use: Financial Withdrawal Symptoms: Other (Comment) (denied withdrawals in past 13 yrs)          Patient was born in [place] . Pt came to [place] at the age of 60. Pt got a GED as well as did Estate agent course in college and did several christian courses. Pt is married. Has a good support system from family. Pt does report a hx of being in prison in the past. Denies pending charges.          Allergies:  No Known Allergies Lab Results:  Results for orders placed or performed during the hospital encounter of 04/02/15 (from the past 48 hour(s))  Urine rapid drug screen (hosp performed)not at [facility]     Status: None   Collection Time: 04/02/15  5:14 PM  Result Value Ref Range   Opiates NONE DETECTED NONE DETECTED   Cocaine NONE DETECTED NONE DETECTED   Benzodiazepines NONE DETECTED NONE DETECTED   Amphetamines NONE DETECTED NONE DETECTED   Tetrahydrocannabinol NONE DETECTED NONE DETECTED   Barbiturates NONE DETECTED NONE DETECTED    Comment:        DRUG SCREEN FOR MEDICAL PURPOSES ONLY.  IF  CONFIRMATION IS NEEDED FOR ANY PURPOSE, NOTIFY LAB WITHIN 5 DAYS.        LOWEST DETECTABLE LIMITS FOR URINE DRUG SCREEN Drug Class       Cutoff (ng/mL) Amphetamine      1000 Barbiturate      200 Benzodiazepine   213 Tricyclics       086 Opiates          300 Cocaine          300 THC              50 Performed at [facility]   CBC     Status: None   Collection Time: 04/02/15  7:42 PM  Result Value Ref Range   WBC 8.0 4.0 - 10.5 K/uL   RBC 5.25 4.22 - 5.81 MIL/uL   Hemoglobin 13.8 13.0 - 17.0 g/dL   HCT 42.0 39.0 - 52.0 %   MCV 80.0 78.0 - 100.0 fL   MCH 26.3 26.0 - 34.0 pg   MCHC 32.9 30.0 - 36.0 g/dL   RDW 13.1 11.5 - 15.5 %   Platelets 276 150 - 400 K/uL    Comment: Performed at [facility]  Comprehensive metabolic panel     Status: Abnormal   Collection Time: 04/02/15  7:42 PM  Result Value Ref Range   Sodium 139 135 - 145 mmol/L   Potassium 4.4 3.5 - 5.1 mmol/L   Chloride 102 101 - 111 mmol/L   CO2 28 22 - 32 mmol/L   Glucose, Bld 88 65 - 99 mg/dL   BUN 18 6 - 20 mg/dL   Creatinine, Ser 1.31 (H) 0.61 - 1.24 mg/dL   Calcium 9.9 8.9 - 10.3 mg/dL   Total Protein 8.8 (H) 6.5 - 8.1 g/dL   Albumin 5.0 3.5 - 5.0 g/dL   AST 37 15 - 41 U/L   ALT 42 17 - 63 U/L   Alkaline Phosphatase 52 38 - 126 U/L   Total Bilirubin 0.6 0.3 - 1.2 mg/dL   GFR calc non Af Amer >60 >60 mL/min   GFR calc Af Amer >60 >60 mL/min    Comment: (NOTE) The eGFR has been calculated using the CKD EPI equation. This calculation has not been validated in all clinical situations. eGFR's persistently <60 mL/min signify possible Chronic Kidney Disease.    Anion gap 9 5 - 15    Comment: Performed at [facility]  Ethanol     Status: None   Collection Time: 04/02/15  7:42 PM  Result Value Ref Range   Alcohol, Ethyl (B) <5 <5 mg/dL    Comment:        LOWEST DETECTABLE LIMIT FOR SERUM ALCOHOL IS 5 mg/dL FOR MEDICAL PURPOSES ONLY Performed at [facility]     Metabolic Disorder Labs:  No results found for: HGBA1C, MPG No results found for: PROLACTIN No results found for: CHOL, TRIG, HDL, CHOLHDL, VLDL, LDLCALC  Current Medications: Current Facility-Administered Medications  Medication Dose Route Frequency Provider Last Rate Last Dose  . acetaminophen (TYLENOL) tablet 650 mg  650 mg Oral Q6H PRN Niel Hummer, NP      . alum & mag hydroxide-simeth (MAALOX/MYLANTA) 200-200-20 MG/5ML suspension 30 mL  30 mL Oral Q4H PRN Niel Hummer, NP      . ARIPiprazole (ABILIFY) tablet 2.5 mg  2.5 mg Oral Once Nehan Flaum, MD      . ARIPiprazole (ABILIFY) tablet 5 mg  5 mg Oral QHS Maple Odaniel, MD      . benztropine (COGENTIN) tablet 0.5 mg  0.5 mg Oral QHS  Shahla Betsill, MD      . citalopram (CELEXA) tablet 10 mg  10 mg Oral Daily Jaythen Hamme, MD      . hydrOXYzine (ATARAX/VISTARIL) tablet 25 mg  25 mg Oral Q6H PRN Niel Hummer, NP   25 mg at 04/03/15 1049  . magnesium hydroxide (MILK OF MAGNESIA) suspension 30 mL  30 mL Oral Daily PRN Niel Hummer, NP      . OLANZapine zydis (ZYPREXA) disintegrating tablet 5 mg  5 mg Oral Q8H PRN Niel Hummer, NP      . traZODone (DESYREL) tablet 100 mg  100 mg Oral QHS PRN Niel Hummer, NP   100 mg at 04/03/15 0048   PTA Medications: Prescriptions prior to admission  Medication Sig Dispense Refill Last Dose  . benztropine (COGENTIN) 1 MG tablet Take 1 mg by mouth at bedtime.   04/01/2015  . fenofibrate (TRICOR) 145 MG tablet Take 145 mg by mouth daily.   04/01/2015  . traZODone (DESYREL) 50 MG tablet Take 50-100 mg by mouth at bedtime as needed for sleep.    04/01/2015  . ziprasidone (GEODON) 40 MG capsule Take 40-80 mg by mouth 2 (two) times daily. He takes one capsule in the morning and two capsules at bedtime.   04/01/2015  . lamoTRIgine (LAMICTAL) 25 MG tablet        Musculoskeletal: Strength & Muscle Tone: within normal limits Gait & Station: normal Patient leans: N/A  Psychiatric Specialty Exam: Physical Exam  Constitutional:  I concur with PE done in ED.    Review of Systems  Psychiatric/Behavioral: Positive for depression. The patient is nervous/anxious and has insomnia.   All other systems reviewed and are negative.   Blood pressure 114/68, pulse 68, temperature 98 F (36.7 C), temperature source Oral, resp. rate 18, height _0  (1.6 m), weight 80.74 kg (178 lb), SpO2 100 %.Body mass index is 31.54 kg/(m^2).  General Appearance: Casual  Eye Contact::  Minimal  Speech:  Normal Rate  Volume:  Decreased  Mood:  Anxious, Depressed and Dysphoric  Affect:  Flat  Thought Process:  Irrelevant  Orientation:  Full (Time, Place, and Person)  Thought Content:  Hallucinations: Auditory and  Paranoid Ideation  Suicidal Thoughts:  No  Homicidal Thoughts:  No  Memory:  Immediate;   Fair Recent;   Fair Remote;   Fair  Judgement:  Impaired  Insight:  Shallow  Psychomotor Activity:  Normal  Concentration:  Poor  Recall:  [employer] of Knowledge:Fair  Language: Fair  Akathisia:  No    AIMS (if indicated):     Assets:  Communication Skills Desire for Improvement  ADL's:  Intact  Cognition: WNL  Sleep:  Number of Hours: 4.25     Treatment Plan Summary: Daily contact with patient to assess and evaluate symptoms and progress in treatment and Medication management   Patient will benefit from inpatient treatment and stabilization.  Estimated length of stay is 5-7 days.  Reviewed past medical records,treatment plan.  Will start a trial of Abilify 5 mg po qhs for psychosis, mood lability. Will add Cogentin 0.5 mg po qhs for EPS. Will add Celexa 10 mg po daily for affective sx. Will continue Trazodone 50 mg po qhs for sleep. Will continue to monitor vitals ,medication compliance and treatment side effects while patient is here.  Will monitor for medical issues as well as call consult as needed.  Reviewed labs CBC- WNL , CMP - Creatinine slightly elevated - will repeat it. UDS - Neg , Bal<5, ,will order TSH,lipid panel, Hba1c, pl, ekg since pt is on an antipsychotic medication. CSW will start working on disposition.  Patient to participate in therapeutic milieu .         Observation Level/Precautions:  15 minute checks    Psychotherapy:  Individual and group therapy     Consultations:  Social worker  Discharge Concerns:  Stability and safety       I certify that inpatient services furnished can reasonably be expected to improve the patient's condition.   Clint Strupp MD 9/28/201611:04 AM

## 2015-04-03 NOTE — [facility] Suicide Risk Assessment (Signed)
[facility] Admission Suicide Risk Assessment   Nursing information obtained from:  Patient Demographic factors:  Male, Low socioeconomic status Current Mental Status:  Suicidal ideation indicated by patient, Self-harm thoughts Loss Factors:  Decrease in vocational status, Financial problems / change in socioeconomic status Historical Factors:  NA Risk Reduction Factors:  Sense of responsibility to family, Religious beliefs about death, Employed Total Time spent with patient: 30 minutes Principal Problem: Undifferentiated schizophrenia Diagnosis:   Patient Active Problem List   Diagnosis Date Noted  . Undifferentiated schizophrenia [F20.3] 04/03/2015  . Cannabis use disorder, moderate, in sustained remission [F12.90] 04/03/2015  . Alcohol use disorder, moderate, in sustained remission [F10.99] 04/03/2015     Continued Clinical Symptoms:  Alcohol Use Disorder Identification Test Final Score (AUDIT): 0 The "Alcohol Use Disorders Identification Test", Guidelines for Use in Primary Care, Second Edition.  World Science writer [facility]). Score between 0-7:  no or low risk or alcohol related problems. Score between 8-15:  moderate risk of alcohol related problems. Score between 16-19:  high risk of alcohol related problems. Score 20 or above:  warrants further diagnostic evaluation for alcohol dependence and treatment.   CLINICAL FACTORS:   Schizophrenia:   Less than 14 years old   Psychiatric Specialty Exam: Physical Exam  ROS  Blood pressure 114/68, pulse 68, temperature 98 F (36.7 C), temperature source Oral, resp. rate 18, height  (1.6 m), weight 80.74 kg (178 lb), SpO2 100 %.Body mass index is 31.54 kg/(m^2).    Please see H&P for MSE.  COGNITIVE FEATURES THAT CONTRIBUTE TO RISK:  Closed-mindedness, Polarized thinking and Thought constriction (tunnel vision)    SUICIDE RISK:   Mild:  Suicidal ideation of limited frequency, intensity, duration, and specificity.  There are no  identifiable plans, no associated intent, mild dysphoria and related symptoms, good self-control (both objective and subjective assessment), few other risk factors, and identifiable protective factors, including available and accessible social support.  PLAN OF CARE: Please see H&P.   Medical Decision Making:  Review of Psycho-Social Stressors (1), Review or order clinical lab tests (1), Established Problem, Worsening (2), Review of Last Therapy Session (1), Review of Medication Regimen & Side Effects (2) and Review of New Medication or Change in Dosage (2)  I certify that inpatient services furnished can reasonably be expected to improve the patient's condition.   Eappen,Saramma md 04/03/2015, 11:03 AM

## 2015-04-04 LAB — BASIC METABOLIC PANEL
Anion gap: 5 (ref 5–15)
BUN: 15 mg/dL (ref 6–20)
CHLORIDE: 104 mmol/L (ref 101–111)
CO2: 25 mmol/L (ref 22–32)
Calcium: 9.2 mg/dL (ref 8.9–10.3)
Creatinine, Ser: 1.11 mg/dL (ref 0.61–1.24)
GFR calc Af Amer: >60 mL/min (ref >60)
GFR calc non Af Amer: >60 mL/min (ref >60)
GLUCOSE: 97 mg/dL (ref 65–99)
POTASSIUM: 3.6 mmol/L (ref 3.5–5.1)
Sodium: 134 mmol/L — ABNORMAL LOW (ref 135–145)

## 2015-04-04 LAB — LIPID PANEL
CHOL/HDL RATIO: 3.6 ratio
Cholesterol: 167 mg/dL (ref 0–200)
HDL: 47 mg/dL (ref >40)
LDL CALC: 96 mg/dL (ref 0–99)
Triglycerides: 120 mg/dL (ref <150)
VLDL: 24 mg/dL (ref 0–40)

## 2015-04-04 LAB — HEMOGLOBIN A1C
Hgb A1c MFr Bld: 6.1 % — ABNORMAL HIGH (ref 4.8–5.6)
Mean Plasma Glucose: 128 mg/dL

## 2015-04-04 LAB — TSH: TSH: 0.976 u[IU]/mL (ref 0.350–4.500)

## 2015-04-04 MED ORDER — ARIPIPRAZOLE 10 MG PO TABS
10.0000 mg | ORAL_TABLET | Freq: Every day | ORAL | Status: DC
  Administered 2015-04-04: 10 mg via ORAL
  Filled 2015-04-04 (×2): qty 1

## 2015-04-04 MED ORDER — TRAZODONE HCL 100 MG PO TABS
100.0000 mg | ORAL_TABLET | Freq: Every day | ORAL | Status: DC
  Administered 2015-04-04 – 2015-04-07 (×4): 100 mg via ORAL
  Filled 2015-04-04 (×6): qty 1

## 2015-04-04 NOTE — Progress Notes (Signed)
Patient ID: [name] [name], male   DOB: 03-18-1981, 34 y.o.   MRN: [id number]  Pt currently presents with a blunted affect and cooperative behavior. Per self inventory, pt rates depression, hopelessness and anxiety at a 0. Pt's daily goal is to "go to group, learn something" and they intend to do so by "wash up, prepare to listen feedback (ask)." Pt reports good sleep, a good appetite, normal energy and good concentration. Pt reports to writer "I keep having all these thoughts." Pt reports they are negative and positive, non frightening. Pt has been writing his thoughts down in a journal as a coping skill for anxiety. Pt attends groups and has pleasant interactions with staff and peers.   Pt provided with medications per providers orders. Pt's labs and vitals were monitored throughout the day. Pt supported emotionally and encouraged to express concerns and questions. Pt educated on medications.  Pt's safety ensured with 15 minute and environmental checks. Pt currently denies SI/HI and A/V hallucinations. Pt does endorse seeing "different color patterns through my eyelids when I was trying to sleep last night." Pt verbally agrees to seek staff if SI/HI or A/VH occurs and to consult with staff before acting on these thoughts. Pt still presents with mild confusion about the reason for his admission.Will continue POC.

## 2015-04-04 NOTE — Progress Notes (Signed)
[facility] MD Progress Note  04/04/2015 11:13 AM [name] [name]  MRN:  [id number] Subjective:  Pt states " I am OK with the medications that were started. I feel better.'  Objective:[name] [name] is a 34 y.o.Asian male, married , on SSD , has a past hx of schizophrenia , presented as a walk in at [facility] for worsening sx. Patient per initial evaluation in EHR 'reported hearing and seeing things that scare him. Patient also reported that he was diagnosed with Schizophrenia and is compliant with taking his medication. Patient reported that he receives medication management and therapy at [facility]. Patient also reported feeling paranoid. Patient reported that he feels as if the police are after him. His wife miscarried few months ago and his symptoms worsened."  Patient seen and chart reviewed.Discussed patient with treatment team. Pt today with continued paranoia, delusional thinking - but reports some improvement. Pt also with racing thoughts as well as affective sx, mostly depression. Per staff pt although reports sx as improving , is observed as internally preoccupied , paranoia. Pt is tolerating his medications well, no disruptive issues noted on the unit.      Principal Problem: Undifferentiated schizophrenia Diagnosis:   Patient Active Problem List   Diagnosis Date Noted  . Undifferentiated schizophrenia [F20.3] 04/03/2015  . Cannabis use disorder, moderate, in sustained remission [F12.90] 04/03/2015  . Alcohol use disorder, moderate, in sustained remission [F10.99] 04/03/2015   Total Time spent with patient: 30 minutes  Past Psychiatric History: Pt reports being diagnosed with schizophrenia in 2001. Pt had been tried on geodon, cogentin, trazodone. He does not feel his medications are working for him any more. Pt has had multiple hospitalizations - [facility], [facility] . Pt denies any suicide attempts.   Past Medical History:  Past Medical History  Diagnosis Date  . Mental disorder   . Anxiety    . Depression    History reviewed. No pertinent past surgical history. Family History:  Family History  Problem Relation Age of Onset  . Mental illness Neg Hx    Family Psychiatric  History: Pt reports hx of abusing cannabis /alcohol - but has been sober since 2004 Social History:  History  Alcohol Use No    Comment: denied all drug/alcohol use at this time     History  Drug Use No    Comment: denied all drug /alcohol use at this time    Social History   Social History  . Marital Status: Single    Spouse Name: N/A  . Number of Children: N/A  . Years of Education: N/A   Social History Main Topics  . Smoking status: Never Smoker   . Smokeless tobacco: None  . Alcohol Use: No     Comment: denied all drug/alcohol use at this time  . Drug Use: No     Comment: denied all drug /alcohol use at this time  . Sexual Activity: Yes    Birth Control/ Protection: None   Other Topics Concern  . None   Social History Narrative   Additional Social History:    Pain Medications: tylenol  Prescriptions: cogentin   neurotin   geodon Over the Counter: tylenol History of alcohol / drug use?: Yes Longest period of sobriety (when/how long): 13 years Negative Consequences of Use: Financial Withdrawal Symptoms: Other (Comment) (denied withdrawals in past 13 yrs)               Patient was born in [place] . Pt came to [place] at the  age of 61. Pt got a GED as well as did Estate agent course in college and did several christian courses. Pt is married. Has a good support system from family. Pt does report a hx of being in prison in the past. Denies pending charges.               Sleep: Fair  Appetite:  Fair  Current Medications: Current Facility-Administered Medications  Medication Dose Route Frequency Provider Last Rate Last Dose  . acetaminophen (TYLENOL) tablet 650 mg  650 mg Oral Q6H PRN Niel Hummer, NP      . alum & mag hydroxide-simeth (MAALOX/MYLANTA) 200-200-20  MG/5ML suspension 30 mL  30 mL Oral Q4H PRN Niel Hummer, NP      . ARIPiprazole (ABILIFY) tablet 10 mg  10 mg Oral QHS Saramma Eappen, MD      . benztropine (COGENTIN) tablet 0.5 mg  0.5 mg Oral QHS Ursula Alert, MD   0.5 mg at 04/04/15 0052  . citalopram (CELEXA) tablet 10 mg  10 mg Oral Daily Ursula Alert, MD   10 mg at 04/04/15 0807  . hydrOXYzine (ATARAX/VISTARIL) tablet 25 mg  25 mg Oral Q6H PRN Niel Hummer, NP   25 mg at 04/03/15 1049  . magnesium hydroxide (MILK OF MAGNESIA) suspension 30 mL  30 mL Oral Daily PRN Niel Hummer, NP      . OLANZapine zydis (ZYPREXA) disintegrating tablet 5 mg  5 mg Oral Q8H PRN Niel Hummer, NP      . traZODone (DESYREL) tablet 100 mg  100 mg Oral QHS Ursula Alert, MD        Lab Results:  Results for orders placed or performed during the hospital encounter of 04/02/15 (from the past 48 hour(s))  Urine rapid drug screen (hosp performed)not at [facility]     Status: None   Collection Time: 04/02/15  5:14 PM  Result Value Ref Range   Opiates NONE DETECTED NONE DETECTED   Cocaine NONE DETECTED NONE DETECTED   Benzodiazepines NONE DETECTED NONE DETECTED   Amphetamines NONE DETECTED NONE DETECTED   Tetrahydrocannabinol NONE DETECTED NONE DETECTED   Barbiturates NONE DETECTED NONE DETECTED    Comment:        DRUG SCREEN FOR MEDICAL PURPOSES ONLY.  IF CONFIRMATION IS NEEDED FOR ANY PURPOSE, NOTIFY LAB WITHIN 5 DAYS.        LOWEST DETECTABLE LIMITS FOR URINE DRUG SCREEN Drug Class       Cutoff (ng/mL) Amphetamine      1000 Barbiturate      200 Benzodiazepine   696 Tricyclics       295 Opiates          300 Cocaine          300 THC              50 Performed at [facility]   CBC     Status: None   Collection Time: 04/02/15  7:42 PM  Result Value Ref Range   WBC 8.0 4.0 - 10.5 K/uL   RBC 5.25 4.22 - 5.81 MIL/uL   Hemoglobin 13.8 13.0 - 17.0 g/dL   HCT 42.0 39.0 - 52.0 %   MCV 80.0 78.0 - 100.0 fL   MCH 26.3 26.0 - 34.0  pg   MCHC 32.9 30.0 - 36.0 g/dL   RDW 13.1 11.5 - 15.5 %   Platelets 276 150 - 400 K/uL    Comment: Performed at  [facility]  Comprehensive metabolic panel     Status: Abnormal   Collection Time: 04/02/15  7:42 PM  Result Value Ref Range   Sodium 139 135 - 145 mmol/L   Potassium 4.4 3.5 - 5.1 mmol/L   Chloride 102 101 - 111 mmol/L   CO2 28 22 - 32 mmol/L   Glucose, Bld 88 65 - 99 mg/dL   BUN 18 6 - 20 mg/dL   Creatinine, Ser 1.31 (H) 0.61 - 1.24 mg/dL   Calcium 9.9 8.9 - 10.3 mg/dL   Total Protein 8.8 (H) 6.5 - 8.1 g/dL   Albumin 5.0 3.5 - 5.0 g/dL   AST 37 15 - 41 U/L   ALT 42 17 - 63 U/L   Alkaline Phosphatase 52 38 - 126 U/L   Total Bilirubin 0.6 0.3 - 1.2 mg/dL   GFR calc non Af Amer >60 >60 mL/min   GFR calc Af Amer >60 >60 mL/min    Comment: (NOTE) The eGFR has been calculated using the CKD EPI equation. This calculation has not been validated in all clinical situations. eGFR's persistently <60 mL/min signify possible Chronic Kidney Disease.    Anion gap 9 5 - 15    Comment: Performed at [facility]  Hemoglobin A1c     Status: Abnormal   Collection Time: 04/02/15  7:42 PM  Result Value Ref Range   Hgb A1c MFr Bld 6.1 (H) 4.8 - 5.6 %    Comment: (NOTE)         Pre-diabetes: 5.7 - 6.4         Diabetes: >6.4         Glycemic control for adults with diabetes: <7.0    Mean Plasma Glucose 128 mg/dL    Comment: (NOTE) Performed At: [facility] [place], [place] [id number] Lindon Romp MD AF:[id number] Performed at [facility]   Ethanol     Status: None   Collection Time: 04/02/15  7:42 PM  Result Value Ref Range   Alcohol, Ethyl (B) <5 <5 mg/dL    Comment:        LOWEST DETECTABLE LIMIT FOR SERUM ALCOHOL IS 5 mg/dL FOR MEDICAL PURPOSES ONLY Performed at [facility]   TSH     Status: None   Collection Time: 04/04/15  6:40 AM  Result Value Ref Range   TSH  0.976 0.350 - 4.500 uIU/mL    Comment: Performed at [facility]    Physical Findings: AIMS: Facial and Oral Movements Muscles of Facial Expression: None, normal Lips and Perioral Area: None, normal Jaw: None, normal Tongue: None, normal,Extremity Movements Upper (arms, wrists, hands, fingers): None, normal Lower (legs, knees, ankles, toes): None, normal, Trunk Movements Neck, shoulders, hips: None, normal, Overall Severity Severity of abnormal movements (highest score from questions above): None, normal Incapacitation due to abnormal movements: None, normal Patient's awareness of abnormal movements (rate only patient's report): No Awareness, Dental Status Current problems with teeth and/or dentures?: No Does patient usually wear dentures?: No  CIWA:  CIWA-Ar Total: 1 COWS:  COWS Total Score: 1  Musculoskeletal: Strength & Muscle Tone: within normal limits Gait & Station: normal Patient leans: N/A  Psychiatric Specialty Exam: Review of Systems  Psychiatric/Behavioral: Positive for depression and hallucinations. The patient is nervous/anxious.   All other systems reviewed and are negative.   Blood pressure 134/79, pulse 80, temperature 98 F (36.7 C), temperature source Oral, resp. rate 16, height  5' 3"  (1.6 m), weight 80.74 kg (178 lb), SpO2 100 %.Body mass index is 31.54 kg/(m^2).  General Appearance: Fairly Groomed and Guarded  Engineer, water::  Fair  Speech:  Normal Rate  Volume:  Decreased  Mood:  Anxious and Depressed  Affect:  Congruent  Thought Process:  Linear  Orientation:  Full (Time, Place, and Person)  Thought Content:  Delusions, Hallucinations: reports hearing his thoughts- unknown if true AH, Paranoid Ideation and Rumination  Suicidal Thoughts:  No  Homicidal Thoughts:  No  Memory:  Immediate;   Fair Recent;   Fair Remote;   Fair  Judgement:  Fair  Insight:  Fair  Psychomotor Activity:  Decreased  Concentration:  Poor  Recall:  [employer] of Knowledge:Fair  Language: Fair  Akathisia:  No  Handed:  Right  AIMS (if indicated):     Assets:  Communication Skills Desire for Improvement  ADL's:  Intact  Cognition: WNL  Sleep:  Number of Hours: 7   Treatment Plan Summary: Daily contact with patient to assess and evaluate symptoms and progress in treatment and Medication management   Will increase Abilify to 10 mg po qhs for psychosis, mood lability. Will continue  Cogentin 0.5 mg po qhs for EPS. Will continue Celexa 10 mg po daily for affective sx. Will continue Trazodone 50 mg po qhs for sleep. Will continue to monitor vitals ,medication compliance and treatment side effects while patient is here.  Will monitor for medical issues as well as call consult as needed.  Reviewed labs  TSH - wnl ,lipid panel- pending , Hba1c- wnl , pl- pending  ekg -pending since pt is on an antipsychotic medication. CSW will start working on disposition.  Patient to participate in therapeutic milieu .    Eappen,Saramma md 04/04/2015, 11:13 AM

## 2015-04-04 NOTE — Progress Notes (Signed)
NUTRITION NOTE  Pt seen for pt who was recently started on new medications and has HcgbA1c: 6.1%. Pt reports that prior to admission he was not eating well at home due to depression and home situation. He states appetite has been improving here and that he was able to eat lunch today. He states that MD advised him to drink water throughout the day. Pt states that if he drinks too much water he "thinks too much" and this make depression worse. If he drinks 1 soda or a cup of juice he states that this feeling does not occur.   Provided pt with General Healthy Nutrition handout from the Academy of Nutrition and Dietetics and reviewed it with him, reminding him of the importance of eating healthy foods to feel better emotionally. Pt states he also exercises to help with emotions; encouraged this behavior.  Expect fair compliance.      Trenton Gammon, RD, LDN Inpatient Clinical Dietitian Pager # [phone] After hours/weekend pager # [phone]

## 2015-04-04 NOTE — [facility] Group Notes (Signed)
[facility] LCSW Group Therapy  04/04/2015 1:39 PM  Type of Therapy: Process Group Therapy  Participation Level: Active  Participation Quality: Appropriate  Affect: Flat  Cognitive: Oriented  Insight: Improving  Engagement in Group: Engaged  Engagement in Therapy: Limited  Modes of Intervention: Activity, Clarification, Education, Problem-solving and Support  Summary of Progress/Problems: Today's group addressed the topic of transitions. Pt was alert and pleasant throughout the group."Instead of losing control, I want to gain control" Pt spoke about having a "weak mind and body" and wanting to transistion to a stronger mind and body. Pt explained he could strengthen his body with exercise and he could strengthen his mind by continuing to go to group so he can continue to learn. "I try to keep away from the negative."  Lynn B. Beverely Pace, MSW Intern 04/04/2015 1:39 PM

## 2015-04-04 NOTE — Progress Notes (Signed)
Patient ID: [name] [name], male   DOB: 1981/03/27, 34 y.o.   MRN: [id number]  Adult Psychoeducational Group Note  Date:  04/04/2015 Time:  09:35  Group Topic/Focus:  Building Self Esteem:   The Focus of this group is helping patients become aware of the effects of self-esteem on their lives, the things they and others do that enhance or undermine their self-esteem, seeing the relationship between their level of self-esteem and the choices they make and learning ways to enhance self-esteem.  Participation Level:  Active  Participation Quality:  Appropriate  Affect:  Flat  Cognitive:  Appropriate  Insight: Improving  Engagement in Group:  Engaged  Modes of Intervention:  Activity, Education and Orientation  Additional Comments:  Pt able to identify one daily goal to accomplish.    Aurora Mask 04/04/2015, 11:39 AM

## 2015-04-04 NOTE — Progress Notes (Signed)
D   Pt is pleasant on approach and cooperative   He has some concentration problems and appears to respond to internal stimuli when he is by himself in his room   He needs prompting several times to come get his medications   He interacts minimally but appropriately with others A   Verbal support and encouragement given    Medications administered and effectiveness monitored    Q 15 min checks R   Pt safe at present

## 2015-04-04 NOTE — [facility] Counselor (Signed)
Adult Comprehensive Assessment  Patient ID: [name] [name], male   DOB: Sep 29, 1980, 34 y.o.   MRN: [id number]  Information Source:    Current Stressors:  Educational / Learning stressors: None reported  Employment / Job issues: None reported  Family Relationships: None reported  Surveyor, quantity / Lack of resources (include bankruptcy): Limited income Housing / Lack of housing: None reported Physical health (include injuries & life threatening diseases): None reported Social relationships: None reported Substance abuse: Hx of marijauana use  Bereavement / Loss: Infant daughter passed away recently  Living/Environment/Situation:  Living Arrangements: Spouse/significant other (Pt lives in a home with his wife, both his parents, and several other family members from both his side of the family and his wife's side of the family) Living conditions (as described by patient or guardian): Pt enjoys living with his family How long has patient lived in current situation?: 2012 What is atmosphere in current home: Comfortable  Family History:  Marital status: Married Number of Years Married: 4 What types of issues is patient dealing with in the relationship?: Pt expresses that his mental illness has taken a toll on his marriage because he often becomes angry and irritated with his family Additional relationship information: "I only want to be married once. I want to stay with her forever". Does patient have children?: No (Pt's wife was pregnant but the baby died in the womb at 5 mo. Daughter had to be delivered by induced labor. This has been very difficult for pt. Pt states he and his wife are interested in trying again for kids.)  Childhood History:  By whom was/is the patient raised?: Both parents Additional childhood history information: Came to [place] from [place] with both of his parents when he was 65 yo Description of patient's relationship with caregiver when they were a child: "It was good,  nothing was wrong". Patient's description of current relationship with people who raised him/her: "Good now too but my dad has cancer and i'm supporting him so it's tough at times' Does patient have siblings?: Yes Number of Siblings: 4 Description of patient's current relationship with siblings: "Good" Did patient suffer any verbal/emotional/physical/sexual abuse as a child?: No Did patient suffer from severe childhood neglect?: No Has patient ever been sexually abused/assaulted/raped as an adolescent or adult?: No Was the patient ever a victim of a crime or a disaster?: No Witnessed domestic violence?: No Has patient been effected by domestic violence as an adult?: No  Education:  Highest grade of school patient has completed: GED and some college Currently a student?: No Name of school: NA Contact person: NA Learning disability?: No  Employment/Work Situation:   Employment situation: Employed 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 How long has patient been employed?: Since 2013 Patient's job has been impacted by current illness: No What is the longest time patient has a held a job?: Current job is the longest pt has ever been employed  Where was the patient employed at that time?: [place] Has patient ever been in the [employer]?: No Has patient ever served in Buyer, retail?: No  Financial Resources:   Financial resources: Income from employment, Receives SSI  Alcohol/Substance Abuse:   What has been your use of drugs/alcohol within the last 12 months?: Pt denies current history but admits that a few years ago he had a problem with abusing marijauna and ecstasy pills. pt reported that when he was using he would use every single day and would not eat. If attempted suicide, did drugs/alcohol play a role  in this?: No Alcohol/Substance Abuse Treatment Hx: Past Tx, Outpatient If yes, describe treatment: Pt atteneded AA meetings  Has alcohol/substance abuse ever caused legal problems?:  No  Social Support System:   Patient's Community Support System: Good Describe Community Support System: "Faith and love" Type of faith/religion: "I believe that [name] is the savior and I believe that we go to heaven" How does patient's faith help to cope with current illness?: "I just look at what he goes through, read the bible and pray. God is always there to protect you".  Leisure/Recreation:   Leisure and Hobbies: Go out to eat  Strengths/Needs:   What things does the patient do well?: Drawing In what areas does patient struggle / problems for patient: Anger, voices, thoughts   Discharge Plan:   Does patient have access to transportation?: Yes (Dad will pick pt up) Will patient be returning to same living situation after discharge?: Yes Currently receiving community mental health services: Yes (From Whom) [facility]) If no, would patient like referral for services when discharged?: Yes (What county?) Psychiatric nurse, interested in support groups) Does patient have financial barriers related to discharge medications?: No (Pt has insurance )  Summary/Recommendations:   Summary and Recommendations (to be completed by the evaluator): [name] [name] is a 34 yo male with a diagnosis of Undifferentiated Schizophrenia. Pt decided to come to the hospital due to angry outbursts, and unclear thinking. "I didn't want to be angry anymore, I was depressed, I just felt bad and I said I need to go to the hospital". Pt lives in the home with several family memebers and states that this can sometimes cause stress for him because he feels pressure to provide for his family. Pt is also greiving the death of his infant daughter and has a difficult time coping with it. Other than greiving, pt seems mostly concerned with decreasing his anger, irritability, and endorses AH. 'I get mad for no reason because of all of the things I'm hearing. I'm trying to decide from what is good and what is bad, what is real and what is not".Pt  has strong supports with his family and his wife. He attributes his ability to persevere to his spirituality as well.  [facility] and is agreeable to continuing services with them. Pt would benefit from crisis stabilization, medication evaluation, group therapy, and psychoeducation, in addition to, discharge planning to case management.   [name] [name]. 04/04/2015

## 2015-04-04 NOTE — Progress Notes (Signed)
The patient attended this evening's group but did not participate. He sat and wrote in his bible.

## 2015-04-04 NOTE — Tx Team (Signed)
Interdisciplinary Treatment Plan Update (Adult)  Date:  04/04/2015 Time Reviewed:  12:37 PM  Progress in Treatment: Attending groups: Yes. Participating in groups: Yes. Taking medication as prescribed:  Yes. Tolerating medication:  Yes. Family/Significant othe contact made:  No, will contact:  [name] (sister) [phone] Patient understands diagnosis:  Yes, as evidenced by seeking help with paranoia and "seeing things that scare me" Discussing patient identified problems/goals with staff:  Yes, see initial care plan. Medical problems stabilized or resolved:  Yes Denies suicidal/homicidal ideation: Yes. Issues/concerns per patient self-inventory: No. Other:   Discharge Plan or Barriers:   Pt will return home and receive services from [facility].  Reason for Continuation of Hospitalization: Depression Hallucinations  Comments: [name] [name] is a 34 y.o.Asian male, married , on SSD , has a past hx of schizophrenia , presented as a walk in at [facility] for worsening sx. Patient per initial evaluation in EHR 'reported hearing and seeing things that scare him. Patient also reported that he was diagnosed with Schizophrenia and is compliant with taking his medication. Patient reported that he receives medication management and therapy at [facility]. Patient also reported feeling paranoid. Patient reported that he feels as if the police are after him. His wife miscarried few months ago and his symptoms worsened." Abilify, Celexa, Desyrel trial  Estimated length of stay: 4-5 days  New goal(s):  Review of initial/current patient goals per problem list:  1. Goal(s): Patient will participate in aftercare plan  Met:Yes  Target date: at discharge  As evidenced by: Patient will participate within aftercare plan AEB aftercare provider and housing plan at discharge being identified. 04/04/15: Pt is agreeable to services with [facility] and signed a release.   2. Goal (s): Patient will exhibit  decreased depressive symptoms and suicidal ideations.  Met:No   Target date: at discharge  As evidenced by: Patient will utilize self rating of depression at 3 or below and demonstrate decreased signs of depression or be deemed stable for discharge by MD. 04/04/15: Pt denies SI but still endorses depressive symptoms. Will continue to monitor.   4. Goal(s): Patient will demonstrate decreased signs of psychosis.  Met: No  Target date:at discharge  As evidenced by: Patient will demonstrate decreased signs of psychosis as evidenced by a reduction in AVH, paranoia, and/or delusions.   04/04/15: Pt still endorses AH.     Attendees: Patient:  04/04/2015 12:37 PM  Family:   04/04/2015 12:37 PM  Physician:  Dr. Ursula Alert, MD 04/04/2015 12:37 PM  Nursing:  Marcella Dubs, RN 04/04/2015 12:37 PM  Case Manager:  Roque Lias, [place] 04/04/2015 12:37 PM  Counselor:  Matthew Saras, MSW Intern 04/04/2015 12:37 PM  Other:   04/04/2015 12:37 PM  Other:   04/04/2015 12:37 PM  Other:   04/04/2015 12:37 PM  Other:  04/04/2015 12:37 PM  Other:    Other:    Other:    Other:    Other:    Other:      Scribe for Treatment Team:   Georga Kaufmann, MSW Intern 04/04/2015 12:37 PM

## 2015-04-05 LAB — PROLACTIN: Prolactin: 7 ng/mL (ref 4.0–15.2)

## 2015-04-05 MED ORDER — HYDROXYZINE HCL 25 MG PO TABS: 25.0000 mg | ORAL_TABLET | Freq: Three times a day (TID) | ORAL | Status: DC | PRN

## 2015-04-05 MED ORDER — ARIPIPRAZOLE 15 MG PO TABS
15.0000 mg | ORAL_TABLET | Freq: Every day | ORAL | Status: DC
  Administered 2015-04-05 – 2015-04-07 (×3): 15 mg via ORAL
  Filled 2015-04-05 (×5): qty 1

## 2015-04-05 MED ORDER — CITALOPRAM HYDROBROMIDE 20 MG PO TABS
20.0000 mg | ORAL_TABLET | Freq: Every day | ORAL | Status: DC
  Administered 2015-04-06 – 2015-04-08 (×3): 20 mg via ORAL
  Filled 2015-04-05 (×4): qty 1

## 2015-04-05 NOTE — Progress Notes (Signed)
[facility] Group Notes:  (Nursing/MHT/Case Management/Adjunct)  Date:  04/05/2015  Time:  8:48 PM  Type of Therapy:  Psychoeducational Skills  Participation Level:  Active  Participation Quality:  Appropriate  Affect:  Appropriate  Cognitive:  Appropriate  Insight:  Appropriate  Engagement in Group:  Developing/Improving  Modes of Intervention:  Education  Summary of Progress/Problems: Patient states that he had a good day overall. Patient explained to the group that he had wanted to do more today, but felt tired due to exercising in his room. As a theme for the day, his relapse prevention will be to pray more and take better care of his hygiene.   Hazle Coca S 04/05/2015, 8:48 PM

## 2015-04-05 NOTE — [facility] Group Notes (Signed)
[facility] LCSW Aftercare Discharge Planning Group Note   04/05/2015  9:30 AM  Participation Quality: Engaged  Mood/Affect: Appropriate  Depression Rating: denies  Anxiety Rating: rates at 1  Thoughts of Suicide: No Will you contract for safety? NA  Current AVH: No  Plan for Discharge/Comments: Will follow up at [facility].  Family contact made w sister.  Transportation Means: family will pick up when ready to discharge  Supports: family members  Santa Genera, LCSW Clinical Social Worker

## 2015-04-05 NOTE — Progress Notes (Addendum)
[facility] MD Progress Note  04/05/2015 11:02 AM [name] [name]  MRN:  [id number] Subjective:  Pt states " I am very anxious , I need help with that . I also have memory problems , I have trouble memorizing stuff.'   Objective:[name] [name] is a 34 y.o.Asian male, married , on SSD , has a past hx of schizophrenia , presented as a walk in at [facility] for worsening sx. Patient per initial evaluation in EHR 'reported hearing and seeing things that scare him. Patient also reported that he was diagnosed with Schizophrenia and is compliant with taking his medication. Patient reported that he receives medication management and therapy at [facility]. Patient also reported feeling paranoid. Patient reported that he feels as if the police are after him. His wife miscarried few months ago and his symptoms worsened."  Patient seen and chart reviewed.Discussed patient with treatment team. Pt today appears anxious , he reports that he has been dealing with anxiety at home too. Pt also with trouble concentrating - reports that he easily forgets things and needs reminders to help remember things. Pt provided with reassurance, discussed keeping checklists and reminders.  Pt with continued paranoia , racing thoughts as well as depression and anxiety sx. Pt tolerating his medications well. Discussed increasing his dose today . Per nursing - pt appears paranoid , withdrawn , continues to need a lot of support and constant encouragement to take his medications at the right time. Will continue to monitor.      Principal Problem: Undifferentiated schizophrenia Diagnosis:   Patient Active Problem List   Diagnosis Date Noted  . Undifferentiated schizophrenia [F20.3] 04/03/2015  . Cannabis use disorder, moderate, in sustained remission [F12.90] 04/03/2015  . Alcohol use disorder, moderate, in sustained remission [F10.99] 04/03/2015   Total Time spent with patient: 30 minutes  Past Psychiatric History: Pt reports being diagnosed  with schizophrenia in 2001. Pt had been tried on geodon, cogentin, trazodone. He does not feel his medications are working for him any more. Pt has had multiple hospitalizations - [facility], [facility] . Pt denies any suicide attempts.   Past Medical History:  Past Medical History  Diagnosis Date  . Mental disorder   . Anxiety   . Depression    History reviewed. No pertinent past surgical history. Family History:  Family History  Problem Relation Age of Onset  . Mental illness Neg Hx    Family Psychiatric  History: Pt reports hx of abusing cannabis /alcohol - but has been sober since 2004 Social History:  History  Alcohol Use No    Comment: denied all drug/alcohol use at this time     History  Drug Use No    Comment: denied all drug /alcohol use at this time    Social History   Social History  . Marital Status: Single    Spouse Name: N/A  . Number of Children: N/A  . Years of Education: N/A   Social History Main Topics  . Smoking status: Never Smoker   . Smokeless tobacco: None  . Alcohol Use: No     Comment: denied all drug/alcohol use at this time  . Drug Use: No     Comment: denied all drug /alcohol use at this time  . Sexual Activity: Yes    Birth Control/ Protection: None   Other Topics Concern  . None   Social History Narrative   Additional Social History:    Pain Medications: tylenol  Prescriptions: cogentin   neurotin   geodon  Over the Counter: tylenol History of alcohol / drug use?: Yes Longest period of sobriety (when/how long): 13 years Negative Consequences of Use: Financial Withdrawal Symptoms: Other (Comment) (denied withdrawals in past 13 yrs)               Patient was born in [place] . Pt came to [place] at the age of 11. Pt got a GED as well as did Estate agent course in college and did several christian courses. Pt is married. Has a good support system from family. Pt does report a hx of being in prison in the past. Denies pending  charges.               Sleep: Fair  Appetite:  Fair  Current Medications: Current Facility-Administered Medications  Medication Dose Route Frequency Provider Last Rate Last Dose  . acetaminophen (TYLENOL) tablet 650 mg  650 mg Oral Q6H PRN Niel Hummer, NP      . alum & mag hydroxide-simeth (MAALOX/MYLANTA) 200-200-20 MG/5ML suspension 30 mL  30 mL Oral Q4H PRN Niel Hummer, NP      . ARIPiprazole (ABILIFY) tablet 15 mg  15 mg Oral QHS Saramma Eappen, MD      . benztropine (COGENTIN) tablet 0.5 mg  0.5 mg Oral QHS Ursula Alert, MD   0.5 mg at 04/04/15 2225  . citalopram (CELEXA) tablet 10 mg  10 mg Oral Daily Ursula Alert, MD   10 mg at 04/05/15 0805  . hydrOXYzine (ATARAX/VISTARIL) tablet 25 mg  25 mg Oral Q6H PRN Niel Hummer, NP   25 mg at 04/03/15 1049  . magnesium hydroxide (MILK OF MAGNESIA) suspension 30 mL  30 mL Oral Daily PRN Niel Hummer, NP      . OLANZapine zydis (ZYPREXA) disintegrating tablet 5 mg  5 mg Oral Q8H PRN Niel Hummer, NP      . traZODone (DESYREL) tablet 100 mg  100 mg Oral QHS Ursula Alert, MD   100 mg at 04/04/15 2225    Lab Results:  Results for orders placed or performed during the hospital encounter of 04/02/15 (from the past 48 hour(s))  Basic metabolic panel     Status: Abnormal   Collection Time: 04/04/15  6:40 AM  Result Value Ref Range   Sodium 134 (L) 135 - 145 mmol/L   Potassium 3.6 3.5 - 5.1 mmol/L    Comment: REPEATED TO VERIFY DELTA CHECK NOTED    Chloride 104 101 - 111 mmol/L   CO2 25 22 - 32 mmol/L   Glucose, Bld 97 65 - 99 mg/dL   BUN 15 6 - 20 mg/dL   Creatinine, Ser 1.11 0.61 - 1.24 mg/dL   Calcium 9.2 8.9 - 10.3 mg/dL   GFR calc non Af Amer >60 >60 mL/min   GFR calc Af Amer >60 >60 mL/min    Comment: (NOTE) The eGFR has been calculated using the CKD EPI equation. This calculation has not been validated in all clinical situations. eGFR's persistently <60 mL/min signify possible Chronic Kidney Disease.     Anion gap 5 5 - 15    Comment: Performed at [facility]  Lipid panel     Status: None   Collection Time: 04/04/15  6:40 AM  Result Value Ref Range   Cholesterol 167 0 - 200 mg/dL   Triglycerides 120 <150 mg/dL   HDL 47 >40 mg/dL   Total CHOL/HDL Ratio 3.6 RATIO   VLDL 24 0 - 40 mg/dL  LDL Cholesterol 96 0 - 99 mg/dL    Comment:        Total Cholesterol/HDL:CHD Risk Coronary Heart Disease Risk Table                     Men   Women  1/2 Average Risk   3.4   3.3  Average Risk       5.0   4.4  2 X Average Risk   9.6   7.1  3 X Average Risk  23.4   11.0        Use the calculated Patient Ratio above and the CHD Risk Table to determine the patient's CHD Risk.        ATP III CLASSIFICATION (LDL):  <100     mg/dL   Optimal  100-129  mg/dL   Near or Above                    Optimal  130-159  mg/dL   Borderline  160-189  mg/dL   High  >190     mg/dL   Very High Performed at [facility]   TSH     Status: None   Collection Time: 04/04/15  6:40 AM  Result Value Ref Range   TSH 0.976 0.350 - 4.500 uIU/mL    Comment: Performed at [facility]  Prolactin     Status: None   Collection Time: 04/04/15  6:40 AM  Result Value Ref Range   Prolactin 7.0 4.0 - 15.2 ng/mL    Comment: (NOTE) Performed At: [facility] [place], [place] [id number] Lindon Romp MD [id number] Performed at [facility]     Physical Findings: AIMS: Facial and Oral Movements Muscles of Facial Expression: None, normal Lips and Perioral Area: None, normal Jaw: None, normal Tongue: None, normal,Extremity Movements Upper (arms, wrists, hands, fingers): None, normal Lower (legs, knees, ankles, toes): None, normal, Trunk Movements Neck, shoulders, hips: None, normal, Overall Severity Severity of abnormal movements (highest score from questions above): None, normal Incapacitation due to abnormal movements: None,  normal Patient's awareness of abnormal movements (rate only patient's report): No Awareness, Dental Status Current problems with teeth and/or dentures?: No Does patient usually wear dentures?: No  CIWA:  CIWA-Ar Total: 1 COWS:  COWS Total Score: 1  Musculoskeletal: Strength & Muscle Tone: within normal limits Gait & Station: normal Patient leans: N/A  Psychiatric Specialty Exam: Review of Systems  Psychiatric/Behavioral: Positive for depression and hallucinations. The patient is nervous/anxious.   All other systems reviewed and are negative.   Blood pressure 139/70, pulse 87, temperature 98.3 F (36.8 C), temperature source Oral, resp. rate 16, height _0  (1.6 m), weight 80.74 kg (178 lb), SpO2 100 %.Body mass index is 31.54 kg/(m^2).  General Appearance: Fairly Groomed and Guarded  Engineer, water::  Fair  Speech:  Normal Rate  Volume:  Decreased  Mood:  Anxious and Depressed  Affect:  Congruent  Thought Process:  Linear  Orientation:  Full (Time, Place, and Person)  Thought Content:  Delusions, Hallucinations: Auditory, Paranoid Ideation and Rumination  Suicidal Thoughts:  No  Homicidal Thoughts:  No  Memory:  Immediate;   Fair Recent;   Fair Remote;   Fair  Judgement:  Fair  Insight:  Fair  Psychomotor Activity:  Decreased  Concentration:  Poor  Recall:  Moroni  Language: Fair  Akathisia:  No  Handed:  Right  AIMS (if indicated):     Assets:  Communication Skills Desire for Improvement  ADL's:  Intact  Cognition: WNL  Sleep:  Number of Hours: 6.75   Treatment Plan Summary: Daily contact with patient to assess and evaluate symptoms and progress in treatment and Medication management   Will increase Abilify to 15 mg po qhs for psychosis, mood lability. Will continue  Cogentin 0.5 mg po qhs for EPS. Will increase Celexa to 20 mg po daily for affective sx. Will also provide Vistaril 25 mg po tid prn for extreme anxiety sx. Also discussed  relaxation techniques , deep breathing , doing activities that he enjoy to help cope with anxiety sx. Will continue Trazodone 50 mg po qhs for sleep. Will continue to monitor vitals ,medication compliance and treatment side effects while patient is here.  Will monitor for medical issues as well as call consult as needed.  Reviewed labs  TSH - wnl ,lipid panel- pending , Hba1c- wnl , pl- 7,  ekg -pending since pt is on an antipsychotic medication. BMP - shows Na low- will repeat tomorrow AM. CSW will start working on disposition.  Patient to participate in therapeutic milieu .    Eappen,Saramma md 04/05/2015, 11:02 AM

## 2015-04-05 NOTE — [facility] Suicide Risk Assessment (Signed)
[facility] INPATIENT:  Family/Significant Other Suicide Prevention Education  Suicide Prevention Education:  Education Completed; Hoby Kawai (sister) [phone] has been identified by the patient as the family member/significant other with whom the patient will be residing, and identified as the person(s) who will aid the patient in the event of a mental health crisis (suicidal ideations/suicide attempt).  With written consent from the patient, the family member/significant other has been provided the following suicide prevention education, prior to the and/or following the discharge of the patient.  The suicide prevention education provided includes the following:  Suicide risk factors  Suicide prevention and interventions  National Suicide Hotline telephone number  [facility] assessment telephone number  [facility] Emergency Assistance 911  [facility] and/or Residential Mobile Crisis Unit telephone number  Request made of family/significant other to:  Remove weapons (e.g., guns, rifles, knives), all items previously/currently identified as safety concern.    Remove drugs/medications (over-the-counter, prescriptions, illicit drugs), all items previously/currently identified as a safety concern.  The family member/significant other verbalizes understanding of the suicide prevention education information provided.  The family member/significant other agrees to remove the items of safety concern listed above.  Jonathon Jordan 04/05/2015, 11:39 AM

## 2015-04-05 NOTE — Plan of Care (Signed)
Problem: Alteration in thought process Goal: STG-Patient does not respond to command hallucinations Outcome: Not Progressing Pt appeared to be responding to internal stimuli but tries to minimize it.

## 2015-04-05 NOTE — [facility] Group Notes (Signed)
[facility] LCSW Group Therapy   04/05/2015 1:38 PM  Type of Therapy: Group Therapy  Participation Level:  Active  Participation Quality:  Attentive  Affect:  Flat  Cognitive:  Oriented  Insight:  Limited  Engagement in Therapy:  Engaged  Modes of Intervention:  Discussion and Socialization  Summary of Progress/Problems: Chaplain was here to lead a group on themes of hope and/or love and courage.  Pt was alert and pleasant throughout the group. Pt spoke about love being something that he has struggled with throughout his life. "Life is like a book...but you can be blind and still feel love, you can lose all your senses but still show and feel love. Animals show love and they don't even have a spirit or a soul". Pt also spoke about his wife being a big part of his support system. "She loves me unconditionally, even though I lost my ring...she still loves me". Pt spoke about how the love between him and his wife helped them get through the loss of their daughter.  Jonathon Jordan 04/05/2015 1:38 PM

## 2015-04-05 NOTE — Progress Notes (Signed)
Patient ID: [name] [name], male   DOB: [birth year], 34 y.o.   MRN: [id number]  Pt currently presents with a flat affect and guarded behavior. Pt reports some confusion today, found lying in bed staring at celing. Pt states "I'm tired but I can't sleep, I think I'm just going to lie here and rest." Per check sheet, pt slept about 6 hours last night. Per self inventory, pt rates depression at a 0, hopelessness 0 and anxiety 2. Pt's daily goal is to "sleep, go to group" and they intend to do so by "rest, relax." Pt reports good sleep, a good appetite, normal energy and good concentration.   Pt provided with medications per providers orders. Pt's labs and vitals were monitored throughout the day. Pt supported emotionally and encouraged to express concerns and questions. Pt educated on medications and alternative stress management techniques like deep breathing.  Pt's safety ensured with 15 minute and environmental checks. Pt currently denies SI/HI and A/V hallucinations. Pt verbally agrees to seek staff if SI/HI or A/VH occurs and to consult with staff before acting on these thoughts. Pt still complaining only of "thoughts, negative thoughts I keep having, I tell myself they aren't right." Pt states "I've been trying to write them down, I don't know if its helping." Will continue POC.

## 2015-04-05 NOTE — Progress Notes (Signed)
D: "I need to loss about 40 lbs to feel better. Do you think I should drink only water?". Patient mood and affect is guarded. Pt reports his mother asked him to share some information with treatment team. Pt reports he is writing his thoughts in a journal to share with treatment team. Pt appeared to be minimizing symptoms. Pt denies SI/HI/AVH and pain. Cooperative with assessment.    A: Met with pt 1:1. Medications administered as prescribed. Support and encouragement provided to attend groups and engage in milieu. Pt encouraged to discuss feelings and come to staff with any question or concerns.   R: Patient remains safe and complaint with medications.

## 2015-04-06 DIAGNOSIS — F203 Undifferentiated schizophrenia: Principal | ICD-10-CM

## 2015-04-06 DIAGNOSIS — F1290 Cannabis use, unspecified, uncomplicated: Secondary | ICD-10-CM

## 2015-04-06 DIAGNOSIS — F1021 Alcohol dependence, in remission: Secondary | ICD-10-CM

## 2015-04-06 LAB — BASIC METABOLIC PANEL
Anion gap: 5 (ref 5–15)
BUN: 15 mg/dL (ref 6–20)
CALCIUM: 9.3 mg/dL (ref 8.9–10.3)
CO2: 28 mmol/L (ref 22–32)
Chloride: 105 mmol/L (ref 101–111)
Creatinine, Ser: 1.09 mg/dL (ref 0.61–1.24)
GFR calc Af Amer: >60 mL/min (ref >60)
GLUCOSE: 99 mg/dL (ref 65–99)
POTASSIUM: 3.8 mmol/L (ref 3.5–5.1)
Sodium: 138 mmol/L (ref 135–145)

## 2015-04-06 NOTE — Progress Notes (Signed)
D:Per patient self inventory form pt reports he slept good last night with the use of sleep medication. He reports a good appetite, normal energy level, good concentration. He rates depression 0/10, hopelessness 0/10, anxiety 1/10- all on 0-10 scale, 10 being the worse. He denies physical problems. He reports his goal for the day is to "plan my relapse prevention." He reports he will "talk about it" to help help meet his goal today. Pt attended nursing group this morning on the unit. Active participation.  "I want to change my negative thoughts to positive thoughts."  A:special checks q 15 mins in place for safety. Medication administered per MD order (see eMAr). Encouragement and support provided.  R:Pt compliant with medication regimen. Safety maintained. Will continue to monitor.

## 2015-04-06 NOTE — Progress Notes (Signed)
[facility] MD Progress Note  04/06/2015 9:38 AM [name] [name]  MRN:  [id number] Subjective:  Pt states " I feel tired today. I ate good today, and I normally don't eat if I am upset. I went to group this morning and I learning to stay positive and watch what comes out of my mouth. I try to think about funny things that come to mind that make me laugh. When I am at home there are things that I don't want to do but I have to do it because Im forced to do it, such as drive my wife when I am sleepy. When I am manic I rush to do things and It is ok, but I do not like disorganized things.' Denies SI/HI/AVH, and is able to contract for safety.    Objective:[name] [name] is a 34 y.o.Asian male, married , on SSD , has a past hx of schizophrenia , presented as a walk in at [facility] for worsening sx. Patient per initial evaluation in EHR 'reported hearing and seeing things that scare him. Patient also reported that he was diagnosed with Schizophrenia and is compliant with taking his medication. Patient reported that he receives medication management and therapy at [facility]. Patient also reported feeling paranoid. Patient reported that he feels as if the police are after him. His wife miscarried few months ago and his symptoms worsened."  Patient seen and chart reviewed.Discussed patient with treatment team. Pt today appears labile, he reports that he feels tired but better. He suspects that it was from the medication that he is fatigue. Marland Kitchen Pt provided with reassurance, discussed keeping checklists and reminders.  Pt with continued paranoia, rumination, racing thoughts as well as depression and anxiety sx. Pt tolerating his medications well.   Per nursing - pt appears paranoid , withdrawn , continues to need a lot of support and constant encouragement to take his medications at the right time. Will continue to monitor.      Principal Problem: Undifferentiated schizophrenia ([place]) Diagnosis:   Patient Active Problem List    Diagnosis Date Noted  . Undifferentiated schizophrenia [F20.3] 04/03/2015  . Cannabis use disorder, moderate, in sustained remission [F12.90] 04/03/2015  . Alcohol use disorder, moderate, in sustained remission [F10.21] 04/03/2015   Total Time spent with patient: 30 minutes  Past Psychiatric History: Pt reports being diagnosed with schizophrenia in 2001. Pt had been tried on geodon, cogentin, trazodone. He does not feel his medications are working for him any more. Pt has had multiple hospitalizations - [facility], [facility] . Pt denies any suicide attempts.   Past Medical History:  Past Medical History  Diagnosis Date  . Mental disorder   . Anxiety   . Depression    History reviewed. No pertinent past surgical history. Family History:  Family History  Problem Relation Age of Onset  . Mental illness Neg Hx    Family Psychiatric  History: Pt reports hx of abusing cannabis /alcohol - but has been sober since 2004 Social History:  History  Alcohol Use No    Comment: denied all drug/alcohol use at this time     History  Drug Use No    Comment: denied all drug /alcohol use at this time    Social History   Social History  . Marital Status: Single    Spouse Name: N/A  . Number of Children: N/A  . Years of Education: N/A   Social History Main Topics  . Smoking status: Never Smoker   . Smokeless tobacco:  None  . Alcohol Use: No     Comment: denied all drug/alcohol use at this time  . Drug Use: No     Comment: denied all drug /alcohol use at this time  . Sexual Activity: Yes    Birth Control/ Protection: None   Other Topics Concern  . None   Social History Narrative   Additional Social History:    Pain Medications: tylenol  Prescriptions: cogentin   neurotin   geodon Over the Counter: tylenol History of alcohol / drug use?: Yes Longest period of sobriety (when/how long): 13 years Negative Consequences of Use: Financial Withdrawal Symptoms: Other (Comment) (denied  withdrawals in past 13 yrs)     Patient was born in [place] . Pt came to [place] at the age of 54. Pt got a GED as well as did Estate agent course in college and did several christian courses. Pt is married. Has a good support system from family. Pt does report a hx of being in prison in the past. Denies pending charges.    Sleep: Good  Appetite:  Good  Current Medications: Current Facility-Administered Medications  Medication Dose Route Frequency Provider Last Rate Last Dose  . acetaminophen (TYLENOL) tablet 650 mg  650 mg Oral Q6H PRN Niel Hummer, NP      . alum & mag hydroxide-simeth (MAALOX/MYLANTA) 200-200-20 MG/5ML suspension 30 mL  30 mL Oral Q4H PRN Niel Hummer, NP      . ARIPiprazole (ABILIFY) tablet 15 mg  15 mg Oral QHS Ursula Alert, MD   15 mg at 04/05/15 2117  . benztropine (COGENTIN) tablet 0.5 mg  0.5 mg Oral QHS Ursula Alert, MD   0.5 mg at 04/05/15 2117  . citalopram (CELEXA) tablet 20 mg  20 mg Oral Daily Ursula Alert, MD   20 mg at 04/06/15 0813  . hydrOXYzine (ATARAX/VISTARIL) tablet 25 mg  25 mg Oral TID PRN Ursula Alert, MD      . magnesium hydroxide (MILK OF MAGNESIA) suspension 30 mL  30 mL Oral Daily PRN Niel Hummer, NP      . OLANZapine zydis (ZYPREXA) disintegrating tablet 5 mg  5 mg Oral Q8H PRN Niel Hummer, NP      . traZODone (DESYREL) tablet 100 mg  100 mg Oral QHS Ursula Alert, MD   100 mg at 04/05/15 2117    Lab Results:  Results for orders placed or performed during the hospital encounter of 04/02/15 (from the past 48 hour(s))  Basic metabolic panel     Status: None   Collection Time: 04/06/15  6:23 AM  Result Value Ref Range   Sodium 138 135 - 145 mmol/L   Potassium 3.8 3.5 - 5.1 mmol/L   Chloride 105 101 - 111 mmol/L   CO2 28 22 - 32 mmol/L   Glucose, Bld 99 65 - 99 mg/dL   BUN 15 6 - 20 mg/dL   Creatinine, Ser 1.09 0.61 - 1.24 mg/dL   Calcium 9.3 8.9 - 10.3 mg/dL   GFR calc non Af Amer >60 >60 mL/min   GFR calc Af Amer  >60 >60 mL/min    Comment: (NOTE) The eGFR has been calculated using the CKD EPI equation. This calculation has not been validated in all clinical situations. eGFR's persistently <60 mL/min signify possible Chronic Kidney Disease.    Anion gap 5 5 - 15    Comment: Performed at [facility]    Physical Findings: AIMS: Facial and Oral Movements  Muscles of Facial Expression: None, normal Lips and Perioral Area: None, normal Jaw: None, normal Tongue: None, normal,Extremity Movements Upper (arms, wrists, hands, fingers): None, normal Lower (legs, knees, ankles, toes): None, normal, Trunk Movements Neck, shoulders, hips: None, normal, Overall Severity Severity of abnormal movements (highest score from questions above): None, normal Incapacitation due to abnormal movements: None, normal Patient's awareness of abnormal movements (rate only patient's report): No Awareness, Dental Status Current problems with teeth and/or dentures?: No Does patient usually wear dentures?: No  CIWA:  CIWA-Ar Total: 1 COWS:  COWS Total Score: 1  Musculoskeletal: Strength & Muscle Tone: within normal limits Gait & Station: normal Patient leans: N/A  Psychiatric Specialty Exam: Review of Systems  Psychiatric/Behavioral: Positive for depression and hallucinations. The patient is nervous/anxious.   All other systems reviewed and are negative.   Blood pressure 139/70, pulse 87, temperature 98.3 F (36.8 C), temperature source Oral, resp. rate 16, height 5' 3" (1.6 m), weight 80.74 kg (178 lb), SpO2 100 %.Body mass index is 31.54 kg/(m^2).  General Appearance: Fairly Groomed and Guarded  Engineer, water::  Fair  Speech:  Normal Rate  Volume:  Decreased  Mood:  Anxious and Depressed  Affect:  Congruent  Thought Process:  Linear  Orientation:  Full (Time, Place, and Person)  Thought Content:  Delusions, Hallucinations: Auditory, Paranoid Ideation and Rumination  Suicidal Thoughts:  No   Homicidal Thoughts:  No  Memory:  Immediate;   Fair Recent;   Fair Remote;   Fair  Judgement:  Fair  Insight:  Fair  Psychomotor Activity:  Decreased  Concentration:  Poor  Recall:  [employer] of Knowledge:Fair  Language: Fair  Akathisia:  No  Handed:  Right  AIMS (if indicated):     Assets:  Communication Skills Desire for Improvement  ADL's:  Intact  Cognition: WNL  Sleep:  Number of Hours: 6.5   Treatment Plan Summary: Daily contact with patient to assess and evaluate symptoms and progress in treatment and Medication management   Will increase Abilify to 15 mg po qhs for psychosis, mood lability. Will continue  Cogentin 0.5 mg po qhs for EPS. Will increase Celexa to 20 mg po daily for affective sx. Will also provide Vistaril 25 mg po tid prn for extreme anxiety sx. Also discussed relaxation techniques , deep breathing , doing activities that he enjoy to help cope with anxiety sx. Will continue Trazodone 50 mg po qhs for sleep. Will continue to monitor vitals ,medication compliance and treatment side effects while patient is here.  Will monitor for medical issues as well as call consult as needed.  Reviewed labs  TSH - wnl ,lipid panel- pending , Hba1c- wnl , pl- 7,  ekg -pending since pt is on an antipsychotic medication. BMP - shows Na low- will repeat tomorrow AM. CSW will start working on disposition.  Patient to participate in therapeutic milieu .    Priscille Loveless S FNP-BC  04/06/2015, 9:38 AM Agree with Progress Note as above  Neita Garnet, MD

## 2015-04-06 NOTE — [facility] Group Notes (Signed)
[facility] Group Notes:  (Nursing/MHT/Case Management/Adjunct)  Date:  04/06/2015  Time:0900 Type of Therapy:  Nurse Education  Participation Level:  Active  Participation Quality:  Appropriate  Affect:  Appropriate  Cognitive:  Oriented  Insight:  Good  Engagement in Group:  Engaged  Modes of Intervention:  Discussion, Orientation, Socialization and Support  Summary of Progress/Problems:  Dara Hoyer 04/06/2015, 9:36 AM

## 2015-04-06 NOTE — Progress Notes (Signed)
Adult Psychoeducational Group Note  Date:  04/06/2015 Time:  8:49 PM  Group Topic/Focus:  Wrap-Up Group:   The focus of this group is to help patients review their daily goal of treatment and discuss progress on daily workbooks.  Participation Level:  Active  Participation Quality:  Appropriate  Affect:  Appropriate  Cognitive:  Appropriate  Insight: Appropriate  Engagement in Group:  Engaged  Modes of Intervention:  Discussion  Additional Comments:The patient expressed  That he rates his day a 9.The patient also said that he enjoyed recreation therapy.  Octavio Manns 04/06/2015, 8:49 PM

## 2015-04-06 NOTE — Progress Notes (Signed)
D:Pt seemed confused at times not sure what he needs to do. When asked to get snack he responded "do you think I need to eat'. Pt appeared preoccupied about his weight. Patient mood and affect is guarded. Pt in room witting and drawing. Pt appeared to be minimizing symptoms. Pt denies SI/HI/AVH and pain. Cooperative with assessment.    A: Met with pt 1:1. Medications administered as prescribed. Support and encouragement provided to attend groups and engage in milieu. Pt encouraged to discuss feelings and come to staff with any question or concerns.   R: Patient remains safe and complaint with medications.

## 2015-04-06 NOTE — [facility] Group Notes (Signed)
[facility] Group Notes:  (Clinical Social Work)  04/06/2015  11:15-12:00PM  Summary of Progress/Problems:   The main focus of today's process group was to discuss patients' feelings related to being hospitalized, as well as the difference between "being" and "having" a mental health diagnosis.  It was agreed in general by the group that it would be preferable to avoid future hospitalizations, and we discussed means of doing that.  As a follow-up, problems with adhering to medication recommendations were discussed.  The patient expressed their primary feeling about being hospitalized is "it's like being at home or on vacation, except you're away from your family and you don't get to do what you want."  He did say he is spending the time here to learn as much as possible about how to make a better life that will help him to get along better with others and help him be happier.  He talked several times about figuring out what kind of sacrifices he has to make  Type of Therapy:  Group Therapy - Process  Participation Level:  Active  Participation Quality:  Attentive and Sharing  Affect:  Blunted  Cognitive:  Disorganized  Insight:  Developing/Improving  Engagement in Therapy:  Developing/Improving  Modes of Intervention:  Exploration, Discussion  Ambrose Mantle, LCSW 04/06/2015, 12:01 PM

## 2015-04-06 NOTE — Plan of Care (Signed)
Problem: Diagnosis: Increased Risk For Suicide Attempt Goal: STG-Patient Will Comply With Medication Regime Outcome: Progressing Pt compliant with medication regime     

## 2015-04-07 NOTE — Plan of Care (Signed)
Problem: Diagnosis: Increased Risk For Suicide Attempt Goal: LTG-Patient Will Report Improved Mood and Deny Suicidal LTG (by discharge) Patient will report improved mood and deny suicidal ideation.  Outcome: Progressing Pt denies suicidal ideation     

## 2015-04-07 NOTE — Progress Notes (Signed)
D:Per patient self inventory form pt reports he slept good last night with the use of sleep medication. He reports a good appetite, normal energy level, good concentration. He rates depression 0/10, hopelessness 0/10, anxiety 0/10- all on 0-10 scale, 10 being the worse. He denies physical problems. Denies SI/HI. Denies AVH. He reports his goal for the day is "go to groups have a good day relax spiritual food." Pt reports he will "get involved" to help meet his goal. Pt attending nurse group on the unit. Disorganized, concrete thinking noted.  A:Special checks q 15 mins in place for safety. Medication administered per MD order(see eMAR). Encouragement and support provided.   R: Safety maintained. Compliant with medication regimen. Will continue to monitor.

## 2015-04-07 NOTE — Progress Notes (Signed)
Adult Psychoeducational Group Note  Date:  04/07/2015 Time:  10:27 PM  Group Topic/Focus:  Wrap-Up Group:   The focus of this group is to help patients review their daily goal of treatment and discuss progress on daily workbooks.  Participation Level:  Active  Participation Quality:  Appropriate  Affect:  Appropriate  Cognitive:  Alert  Insight: Appropriate  Engagement in Group:  Engaged  Modes of Intervention:  Discussion  Additional Comments:  Patient stated having a good day. Patient stated he didn't have any bad thoughts. Patient stated he had a great time with his visitors. Patient also want to thank the staff at [facility] for finding the help that he needed. Patient stated he was discharging tomorrow.   [name] [name] 04/07/2015, 10:27 PM

## 2015-04-07 NOTE — Progress Notes (Signed)
[facility] MD Progress Note  04/07/2015 12:00 PM [name] [name]  MRN:  [id number] Subjective:  Pt states " I heard a song that I like, I used to sing it all the time when I was younger. It made me feel good. I feel good today, I want some sunlight and I want to workout. ' Denies SI/HI/AVH, and is able to contract for safety. He continues to endorse paranoia " I dont like staring at people for too long but not in a bad way."    Objective:[name] [name] is a 34 y.o.Asian male, married , on SSD , has a past hx of schizophrenia , presented as a walk in at [facility] for worsening sx. Patient per initial evaluation in EHR 'reported hearing and seeing things that scare him. Patient also reported that he was diagnosed with Schizophrenia and is compliant with taking his medication. Patient reported that he receives medication management and therapy at [facility].  Patient seen and chart reviewed.Discussed patient with treatment team. Pt today appears labile, he reports that he feels tired but better. He suspects that it was from the medication that he is fatigue. Marland Kitchen Pt provided with reassurance, discussed keeping checklists and reminders. Pt with continued paranoia, rumination, racing thoughts as well as depression and anxiety sx. Pt tolerating his medications well.   Per nursing - pt appears paranoid , withdrawn , continues to need a lot of support and constant encouragement to take his medications at the right time. Will continue to monitor.  Principal Problem: Undifferentiated schizophrenia ([place]) Diagnosis:   Patient Active Problem List   Diagnosis Date Noted  . Undifferentiated schizophrenia ([place]) [F20.3] 04/03/2015  . Cannabis use disorder, moderate, in sustained remission [F12.90] 04/03/2015  . Alcohol use disorder, moderate, in sustained remission ([place]) [F10.21] 04/03/2015   Total Time spent with patient: 30 minutes  Past Psychiatric History: Pt reports being diagnosed with schizophrenia in 2001. Pt had been tried on  geodon, cogentin, trazodone. He does not feel his medications are working for him any more. Pt has had multiple hospitalizations - [facility], [facility] . Pt denies any suicide attempts.   Past Medical History:  Past Medical History  Diagnosis Date  . Mental disorder   . Anxiety   . Depression    History reviewed. No pertinent past surgical history. Family History:  Family History  Problem Relation Age of Onset  . Mental illness Neg Hx    Family Psychiatric  History: Pt reports hx of abusing cannabis /alcohol - but has been sober since 2004 Social History:  History  Alcohol Use No    Comment: denied all drug/alcohol use at this time     History  Drug Use No    Comment: denied all drug /alcohol use at this time    Social History   Social History  . Marital Status: Single    Spouse Name: N/A  . Number of Children: N/A  . Years of Education: N/A   Social History Main Topics  . Smoking status: Never Smoker   . Smokeless tobacco: None  . Alcohol Use: No     Comment: denied all drug/alcohol use at this time  . Drug Use: No     Comment: denied all drug /alcohol use at this time  . Sexual Activity: Yes    Birth Control/ Protection: None   Other Topics Concern  . None   Social History Narrative   Additional Social History:    Pain Medications: tylenol  Prescriptions: cogentin   neurotin  geodon Over the Counter: tylenol History of alcohol / drug use?: Yes Longest period of sobriety (when/how long): 13 years Negative Consequences of Use: Financial Withdrawal Symptoms: Other (Comment) (denied withdrawals in past 13 yrs)     Patient was born in [place] . Pt came to [place] at the age of 69. Pt got a GED as well as did Estate agent course in college and did several christian courses. Pt is married. Has a good support system from family. Pt does report a hx of being in prison in the past. Denies pending charges.    Sleep: Good  Appetite:  Good  Current  Medications: Current Facility-Administered Medications  Medication Dose Route Frequency Provider Last Rate Last Dose  . acetaminophen (TYLENOL) tablet 650 mg  650 mg Oral Q6H PRN Niel Hummer, NP      . alum & mag hydroxide-simeth (MAALOX/MYLANTA) 200-200-20 MG/5ML suspension 30 mL  30 mL Oral Q4H PRN Niel Hummer, NP      . ARIPiprazole (ABILIFY) tablet 15 mg  15 mg Oral QHS Ursula Alert, MD   15 mg at 04/06/15 2113  . benztropine (COGENTIN) tablet 0.5 mg  0.5 mg Oral QHS Ursula Alert, MD   0.5 mg at 04/06/15 2114  . citalopram (CELEXA) tablet 20 mg  20 mg Oral Daily Ursula Alert, MD   20 mg at 04/07/15 0737  . hydrOXYzine (ATARAX/VISTARIL) tablet 25 mg  25 mg Oral TID PRN Ursula Alert, MD      . magnesium hydroxide (MILK OF MAGNESIA) suspension 30 mL  30 mL Oral Daily PRN Niel Hummer, NP      . OLANZapine zydis (ZYPREXA) disintegrating tablet 5 mg  5 mg Oral Q8H PRN Niel Hummer, NP      . traZODone (DESYREL) tablet 100 mg  100 mg Oral QHS Ursula Alert, MD   100 mg at 04/06/15 2114    Lab Results:  Results for orders placed or performed during the hospital encounter of 04/02/15 (from the past 48 hour(s))  Basic metabolic panel     Status: None   Collection Time: 04/06/15  6:23 AM  Result Value Ref Range   Sodium 138 135 - 145 mmol/L   Potassium 3.8 3.5 - 5.1 mmol/L   Chloride 105 101 - 111 mmol/L   CO2 28 22 - 32 mmol/L   Glucose, Bld 99 65 - 99 mg/dL   BUN 15 6 - 20 mg/dL   Creatinine, Ser 1.09 0.61 - 1.24 mg/dL   Calcium 9.3 8.9 - 10.3 mg/dL   GFR calc non Af Amer >60 >60 mL/min   GFR calc Af Amer >60 >60 mL/min    Comment: (NOTE) The eGFR has been calculated using the CKD EPI equation. This calculation has not been validated in all clinical situations. eGFR's persistently <60 mL/min signify possible Chronic Kidney Disease.    Anion gap 5 5 - 15    Comment: Performed at [facility]    Physical Findings: AIMS: Facial and Oral  Movements Muscles of Facial Expression: None, normal Lips and Perioral Area: None, normal Jaw: None, normal Tongue: None, normal,Extremity Movements Upper (arms, wrists, hands, fingers): None, normal Lower (legs, knees, ankles, toes): None, normal, Trunk Movements Neck, shoulders, hips: None, normal, Overall Severity Severity of abnormal movements (highest score from questions above): None, normal Incapacitation due to abnormal movements: None, normal Patient's awareness of abnormal movements (rate only patient's report): No Awareness, Dental Status Current problems with teeth and/or dentures?: No  Does patient usually wear dentures?: No  CIWA:  CIWA-Ar Total: 1 COWS:  COWS Total Score: 1  Musculoskeletal: Strength & Muscle Tone: within normal limits Gait & Station: normal Patient leans: N/A  Psychiatric Specialty Exam: Review of Systems  Psychiatric/Behavioral: Positive for depression and hallucinations. The patient is nervous/anxious.   All other systems reviewed and are negative.   Blood pressure 129/84, pulse 75, temperature 97.9 F (36.6 C), temperature source Oral, resp. rate 20, height 5' 3"  (1.6 m), weight 80.74 kg (178 lb), SpO2 100 %.Body mass index is 31.54 kg/(m^2).  General Appearance: Fairly Groomed and Guarded  Engineer, water::  Fair  Speech:  Normal Rate  Volume:  Normal  Mood:  Anxious and Depressed  Affect:  Appropriate and Congruent  Thought Process:  Goal Directed, Intact and Linear  Orientation:  Full (Time, Place, and Person)  Thought Content:  Rumination  Suicidal Thoughts:  No  Homicidal Thoughts:  No  Memory:  Immediate;   Fair Recent;   Fair Remote;   Fair  Judgement:  Fair  Insight:  Fair  Psychomotor Activity:  Normal  Concentration:  Poor  Recall:  [employer] of Knowledge:Fair  Language: Fair  Akathisia:  No  Handed:  Right  AIMS (if indicated):     Assets:  Communication Skills Desire for Improvement  ADL's:  Intact  Cognition: WNL   Sleep:  Number of Hours: 6   Treatment Plan Summary: Daily contact with patient to assess and evaluate symptoms and progress in treatment and Medication management   Will increase Abilify to 15 mg po qhs for psychosis, mood lability. Will continue  Cogentin 0.5 mg po qhs for EPS. Will increase Celexa to 20 mg po daily for affective sx. Will also provide Vistaril 25 mg po tid prn for extreme anxiety sx. Also discussed relaxation techniques , deep breathing , doing activities that he enjoy to help cope with anxiety sx. Will continue Trazodone 50 mg po qhs for sleep. Will continue to monitor vitals ,medication compliance and treatment side effects while patient is here.  Will monitor for medical issues as well as call consult as needed.  Reviewed labs  TSH - wnl ,lipid panel- pending , Hba1c- wnl , pl- 7,  ekg -pending since pt is on an antipsychotic medication. BMP - shows Na low- will repeat tomorrow AM. CSW will start working on disposition.  Patient to participate in therapeutic milieu .    Priscille Loveless S FNP-BC  04/07/2015, 12:00 PM Agree with Progress Note as above  Neita Garnet, MD

## 2015-04-07 NOTE — B[facility] Group Notes (Signed)
B[facility] Group Notes:  (Clinical Social Work)  04/07/2015  B[facility] Group Notes:  (Clinical Social Work)  04/07/2015  11:00AM-12:00PM  Summary of Progress/Problems:  The main focus of today's process group was to listen to a variety of genres of music and to identify that different types of music provoke different responses.  The patient then was able to identify personally what was soothing for them, as well as energizing.  Handouts were used to record feelings evoked, as well as how patient can personally use this knowledge in sleep habits, with depression, and with other symptoms.  The patient expressed understanding of concepts, as well as knowledge of how each type of music affected him/her and how this can be used at home as a wellness/recovery tool.  He particularly enjoyed the [place] music and [place] music played.  Type of Therapy:  Music Therapy   Participation Level:  Active  Participation Quality:  Attentive and Sharing  Affect:  Blunted  Cognitive:  Oriented  Insight:  Engaged  Engagement in Therapy:  Engaged  Modes of Intervention:   Activity, Exploration  Ambrose Mantle, LCSW 04/07/2015

## 2015-04-07 NOTE — B[facility] Group Notes (Signed)
[facility] Group Notes:  (Nursing/MHT/Case Management/Adjunct)  Date:  04/07/2015  Time: 0900  Type of Therapy:  Nurse Education  Participation Level:  Active  Participation Quality:  Attentive  Affect:  Flat  Cognitive:  Alert and Disorganized  Insight:  Lacking  Engagement in Group:  Improving  Modes of Intervention:  Activity, Discussion, Education, Orientation, Socialization and Support  Summary of Progress/Problems:  Dara Hoyer 04/07/2015, 10:11 AM

## 2015-04-07 NOTE — Plan of Care (Signed)
Problem: Diagnosis: Increased Risk For Suicide Attempt Goal: STG-Patient Will Attend All Groups On The Unit Outcome: Progressing Pt attending groups on the unit.      

## 2015-04-08 MED ORDER — TRAZODONE HCL 100 MG PO TABS: 100.0000 mg | ORAL_TABLET | Freq: Every day | ORAL | Status: DC

## 2015-04-08 MED ORDER — ARIPIPRAZOLE 15 MG PO TABS: 15.0000 mg | ORAL_TABLET | Freq: Every day | ORAL | Status: DC

## 2015-04-08 MED ORDER — BENZTROPINE MESYLATE 0.5 MG PO TABS: 0.5000 mg | ORAL_TABLET | Freq: Every day | ORAL | Status: DC

## 2015-04-08 MED ORDER — HYDROXYZINE HCL 25 MG PO TABS: 25.0000 mg | ORAL_TABLET | Freq: Three times a day (TID) | ORAL | Status: DC | PRN

## 2015-04-08 MED ORDER — CITALOPRAM HYDROBROMIDE 20 MG PO TABS: 20.0000 mg | ORAL_TABLET | Freq: Every day | ORAL | Status: DC

## 2015-04-08 NOTE — Progress Notes (Signed)
  [facility] Adult Case Management Discharge Plan :  Will you be returning to the same living situation after discharge:  Yes,  home At discharge, do you have transportation home?: Yes,  family Do you have the ability to pay for your medications: Yes,  MCD  Release of information consent forms completed and in the chart;  Patient's signature needed at discharge.  Patient to Follow up at: Follow-up Information    Follow up with [facility].   Why:  Someone from the [facility] Team will contact you re: an appointment there.  If you have not heard from anyone by the end of the day on Tuesday, call [phone] on Wednesday to request help with your appointment   Contact information:   [address] [place]  [address] Phone:  [phone] Fax:  [phone]       Patient denies SI/HI: Yes,  yes    Safety Planning and Suicide Prevention discussed: Yes,  Yes  Have you used any form of tobacco in the last 30 days? (Cigarettes, Smokeless Tobacco, Cigars, and/or Pipes): No  Has patient been referred to the Quitline?: N/A patient is not a smoker  [place], Thereasa Distance B 04/08/2015, 10:10 AM

## 2015-04-08 NOTE — Discharge Summary (Signed)
Physician Discharge Summary Note  Patient:  [name] [name] is an 34 y.o., male MRN:  [id number] DOB:  05-15-1981 Patient phone:  [phone] (home)  Patient address:   [address] [place] [address],  Total Time spent with patient: 45 minutes  Date of Admission:  04/02/2015 Date of Discharge: 04/08/2015  Reason for Admission:  History of Present Illness::Natan Lieb is a 34 y.o.Asian male, married , on SSD , has a past hx of schizophrenia , presented as a walk in at [facility] for worsening sx. Patient per initial evaluation in EHR 'reported hearing and seeing things that scare him. Patient also reported that he was diagnosed with Schizophrenia and is compliant with taking his medication. Patient reported that he receives medication management and therapy at [facility]. Patient also reported feeling paranoid. Patient reported that he feels as if the police are after him. His wife miscarried few months ago and his symptoms worsened."  Patient seen and chart reviewed.Discussed patient with treatment team. Pt seen as withdrawn , but calm. Pt reports a lot of racing thoughts and that he hears these thoughts - they are not real voices , but thoughts , very hard to explain. Pt reports that he also feels more depressed, anxious as well as has sleep issues and appetite changes. His wife had a still born baby last Nov 2015. They buried the baby who had congenital defect , and ever since then his sx has been worsening.  Pt reports being diagnosed with schizophrenia in 2001. Pt had been tried on geodon, cogentin, trazodone. He does not feel his medications are working for him any more. Pt has had multiple hospitalizations - [facility], [facility] . Pt denies any suicide attempts. Pt reports hx of abusing cannabis /alcohol - but has been sober since 2004.Pt reports he is on SSD and also works at [employer] off and on.  Principal Problem: Undifferentiated schizophrenia [facility]) Discharge Diagnoses: Patient Active  Problem List   Diagnosis Date Noted  . Undifferentiated schizophrenia ([place]) [F20.3] 04/03/2015  . Cannabis use disorder, moderate, in sustained remission [F12.90] 04/03/2015  . Alcohol use disorder, moderate, in sustained remission ([place]) [F10.21] 04/03/2015    Musculoskeletal: Strength & Muscle Tone: within normal limits Gait & Station: normal Patient leans: N/A  Psychiatric Specialty Exam: Physical Exam  Review of Systems  Psychiatric/Behavioral: Positive for depression. Negative for suicidal ideas, hallucinations and substance abuse. The patient is nervous/anxious and has insomnia.   All other systems reviewed and are negative.   Blood pressure 149/87, pulse 89, temperature 97.5 F (36.4 C), temperature source Oral, resp. rate 18, height 5' 3"  (1.6 m), weight 80.74 kg (178 lb), SpO2 100 %.Body mass index is 31.54 kg/(m^2).  SEE PSE within the MD SRA   Have you used any form of tobacco in the last 30 days? (Cigarettes, Smokeless Tobacco, Cigars, and/or Pipes): No  Has this patient used any form of tobacco in the last 30 days? (Cigarettes, Smokeless Tobacco, Cigars, and/or Pipes) No  Past Medical History:  Past Medical History  Diagnosis Date  . Mental disorder   . Anxiety   . Depression    History reviewed. No pertinent past surgical history. Family History:  Family History  Problem Relation Age of Onset  . Mental illness Neg Hx    Social History:  History  Alcohol Use No    Comment: denied all drug/alcohol use at this time     History  Drug Use No    Comment: denied all drug /alcohol  use at this time    Social History   Social History  . Marital Status: Single    Spouse Name: N/A  . Number of Children: N/A  . Years of Education: N/A   Social History Main Topics  . Smoking status: Never Smoker   . Smokeless tobacco: None  . Alcohol Use: No     Comment: denied all drug/alcohol use at this time  . Drug Use: No     Comment: denied all drug /alcohol use at  this time  . Sexual Activity: Yes    Birth Control/ Protection: None   Other Topics Concern  . None   Social History Narrative    Risk to Self: Suicidal Ideation: Yes-Currently Present Suicidal Intent: No Is patient at risk for suicide?: Yes Suicidal Plan?: No Access to Means: No What has been your use of drugs/alcohol within the last 12 months?: Pt denies current history but admits that a few years ago he had a problem with abusing marijauna and ecstasy pills. pt reported that when he was using he would use every single day and would not eat. How many times?: 3 Other Self Harm Risks: None Reported Triggers for Past Attempts: Other (Comment) (Command voices) Intentional Self Injurious Behavior: None Risk to Others: Homicidal Ideation: No Thoughts of Harm to Others: No Current Homicidal Intent: No Current Homicidal Plan: No Access to Homicidal Means: No Identified Victim: NA History of harm to others?: No Assessment of Violence: None Noted Violent Behavior Description: NA Does patient have access to weapons?: No Criminal Charges Pending?: No Does patient have a court date: No Prior Inpatient Therapy: Prior Inpatient Therapy: Yes Prior Therapy Dates: 2001, 2003, 2005 Prior Therapy Facilty/Provider(s): [facility]; Doretha Dicks; Ernest[facility]na Reason for Treatment: Psychosis; SI Prior Outpatient Therapy: Prior Outpatient Therapy: Yes Prior Therapy Dates: Ongoing  Prior Therapy Facilty/Provider(s): [facility] Reason for Treatment: Medication Management Does patient have an ACCT team?: No Does patient have Intensive In-House Services?  : No Does patient have [facility] services? : No Does patient have P4CC services?: No  Level of Care:  OP  Hospital Course:   Silvia Markuson was admitted for Undifferentiated schizophrenia ([place]) , with psychosis and crisis management.  Pt was treated discharged with the medications listed below under Medication List.  Medical problems were identified and  treated as needed.  Home medications were restarted as appropriate.  Improvement was monitored by observation and [name] 's daily report of symptom reduction.  Emotional and mental status was monitored by daily self-inventory reports completed by [name] and clinical staff.         [name] was evaluated by the treatment team for stability and plans for continued recovery upon discharge. [name] 's motivation was an integral factor for scheduling further treatment. Employment, transportation, bed availability, health status, family support, and any pending legal issues were also considered during hospital stay. Pt was offered further treatment options upon discharge including but not limited to Residential, Intensive Outpatient, and Outpatient treatment.  [name] will follow up with the services as listed below under Follow Up Information.     Upon completion of this admission the patient was both mentally and medically stable for discharge denying suicidal/homicidal ideation, auditory/visual/tactile hallucinations, delusional thoughts and paranoia.    Consults:  None  Significant Diagnostic Studies:  UDS, negative. A1C 6.1  Discharge Vitals:   Blood pressure 149/87, pulse 89, temperature 97.5 F (36.4 C), temperature source Oral, resp. rate 18, height 5' 3"  (1.6 m),  weight 80.74 kg (178 lb), SpO2 100 %. Body mass index is 31.54 kg/(m^2). Lab Results:   Results for orders placed or performed during the hospital encounter of 04/02/15 (from the past 72 hour(s))  Basic metabolic panel     Status: None   Collection Time: 04/06/15  6:23 AM  Result Value Ref Range   Sodium 138 135 - 145 mmol/L   Potassium 3.8 3.5 - 5.1 mmol/L   Chloride 105 101 - 111 mmol/L   CO2 28 22 - 32 mmol/L   Glucose, Bld 99 65 - 99 mg/dL   BUN 15 6 - 20 mg/dL   Creatinine, Ser 1.09 0.61 - 1.24 mg/dL   Calcium 9.3 8.9 - 10.3 mg/dL   GFR calc non Af Amer >60 >60 mL/min   GFR calc Af Amer >60 >60 mL/min     Comment: (NOTE) The eGFR has been calculated using the CKD EPI equation. This calculation has not been validated in all clinical situations. eGFR's persistently <60 mL/min signify possible Chronic Kidney Disease.    Anion gap 5 5 - 15    Comment: Performed at [facility]    Physical Findings: AIMS: Facial and Oral Movements Muscles of Facial Expression: None, normal Lips and Perioral Area: None, normal Jaw: None, normal Tongue: None, normal,Extremity Movements Upper (arms, wrists, hands, fingers): None, normal Lower (legs, knees, ankles, toes): None, normal, Trunk Movements Neck, shoulders, hips: None, normal, Overall Severity Severity of abnormal movements (highest score from questions above): None, normal Incapacitation due to abnormal movements: None, normal Patient's awareness of abnormal movements (rate only patient's report): No Awareness, Dental Status Current problems with teeth and/or dentures?: No Does patient usually wear dentures?: No  CIWA:  CIWA-Ar Total: 1 COWS:  COWS Total Score: 1   See Psychiatric Specialty Exam and Suicide Risk Assessment completed by Attending Physician prior to discharge.  Discharge destination:  Home  Is patient on multiple antipsychotic therapies at discharge:  No   Has Patient had three or more failed trials of antipsychotic monotherapy by history:  No    Recommended Plan for Multiple Antipsychotic Therapies: NA     Medication List    STOP taking these medications        fenofibrate 145 MG tablet  Commonly known as:  TRICOR     lamoTRIgine 25 MG tablet  Commonly known as:  LAMICTAL     ziprasidone 40 MG capsule  Commonly known as:  GEODON      TAKE these medications      Indication   ARIPiprazole 15 MG tablet  Commonly known as:  ABILIFY  Take 1 tablet (15 mg total) by mouth at bedtime.   Indication:  psychosis     benztropine 0.5 MG tablet  Commonly known as:  COGENTIN  Take 1 tablet (0.5 mg  total) by mouth at bedtime.   Indication:  Extrapyramidal Reaction caused by Medications     citalopram 20 MG tablet  Commonly known as:  CELEXA  Take 1 tablet (20 mg total) by mouth daily.   Indication:  depression     hydrOXYzine 25 MG tablet  Commonly known as:  ATARAX/VISTARIL  Take 1 tablet (25 mg total) by mouth 3 (three) times daily as needed for anxiety.   Indication:  Anxiety Neurosis     traZODone 100 MG tablet  Commonly known as:  DESYREL  Take 1 tablet (100 mg total) by mouth at bedtime.   Indication:  Trouble Sleeping  Follow-up Information    Follow up with [facility].   Why:  Walk in clinic from 8 AM - 3 PM.  Please arrive between 8 - 9 AM. You will see several providers on your initial visit.    Contact information:   [address] [place]  [address] Phone:  [phone] Fax:  [phone]      Follow-up recommendations:  Activity:  As tolerated Diet:  Heart healthy with low sodium  Comments:   Take all medications as prescribed. Keep all follow-up appointments as scheduled.  Do not consume alcohol or use illegal drugs while on prescription medications. Report any adverse effects from your medications to your primary care provider promptly.  In the event of recurrent symptoms or worsening symptoms, call 911, a crisis hotline, or go to the nearest emergency department for evaluation.   Total Discharge Time:   Greater than 30 minutes  Signed: Benjamine Mola, FNP-BC 04/08/2015, 9:38 AM

## 2015-04-08 NOTE — BHH Suicide Risk Assessment (Signed)
[facility] Discharge Suicide Risk Assessment   Demographic Factors:  Male  Total Time spent with patient: 30 minutes  Musculoskeletal: Strength & Muscle Tone: within normal limits Gait & Station: normal Patient leans: N/A  Psychiatric Specialty Exam: Physical Exam  Review of Systems  All other systems reviewed and are negative.   Blood pressure 149/87, pulse 89, temperature 97.5 F (36.4 C), temperature source Oral, resp. rate 18, height  (1.6 m), weight 80.74 kg (178 lb), SpO2 100 %.Body mass index is 31.54 kg/(m^2).  General Appearance: Casual  Eye Contact::  Good  Speech:  Clear and Coherent409  Volume:  Normal  Mood:  Euthymic  Affect:  Appropriate  Thought Process:  Coherent  Orientation:  Full (Time, Place, and Person)  Thought Content:  WDL  Suicidal Thoughts:  No  Homicidal Thoughts:  No  Memory:  Immediate;   Fair Recent;   Fair Remote;   Fair  Judgement:  Fair  Insight:  Fair  Psychomotor Activity:  Normal  Concentration:  Fair  Recall:  [employer] of Knowledge:Fair  Language: Fair  Akathisia:  No  Handed:  Right  AIMS (if indicated):     Assets:  Communication Skills Desire for Improvement  Sleep:  Number of Hours: 6  Cognition: WNL  ADL's:  Intact   Have you used any form of tobacco in the last 30 days? (Cigarettes, Smokeless Tobacco, Cigars, and/or Pipes): No  Has this patient used any form of tobacco in the last 30 days? (Cigarettes, Smokeless Tobacco, Cigars, and/or Pipes) No  Mental Status Per Nursing Assessment::   On Admission:  Suicidal ideation indicated by patient, Self-harm thoughts  Current Mental Status by Physician: Pt denies SI/HIA/H/VH  Loss Factors: NA  Historical Factors: Impulsivity  Risk Reduction Factors:   Living with another person, especially a relative and Positive social support  Continued Clinical Symptoms:  Previous Psychiatric Diagnoses and Treatments  Cognitive Features That Contribute To Risk:  None     Suicide Risk:  Minimal: No identifiable suicidal ideation.  Patients presenting with no risk factors but with morbid ruminations; may be classified as minimal risk based on the severity of the depressive symptoms  Principal Problem: Undifferentiated schizophrenia [facility]) Discharge Diagnoses:  Patient Active Problem List   Diagnosis Date Noted  . Undifferentiated schizophrenia ([facility]) [F20.3] 04/03/2015  . Cannabis use disorder, moderate, in sustained remission [F12.90] 04/03/2015  . Alcohol use disorder, moderate, in sustained remission ([facility]) [F10.21] 04/03/2015    Follow-up Information    Follow up with [facility].   Why:  Walk in clinic from 8 AM - 3 PM.  Please arrive between 8 - 9 AM. You will see several providers on your initial visit.    Contact information:   [address] [place]  [address] Phone:  [phone] Fax:  [phone]       Plan Of Care/Follow-up recommendations:  Activity:  No restrictions Diet:  regular Tests:  as needed Other:  follow up with after care as needed  Is patient on multiple antipsychotic therapies at discharge:  No   Has Patient had three or more failed trials of antipsychotic monotherapy by history:  No  Recommended Plan for Multiple Antipsychotic Therapies: NA    Solara Goodchild MD 04/08/2015, 8:32 AM

## 2015-04-08 NOTE — Progress Notes (Signed)
Pt. D/C from the unit to lobby accompanied by family.  He was pleasant and cooperative. He voiced no SI/HI or A/V halluciations.  Pt. Denies any pain or discomfort.  D/C instructions and medications reviewed and he verbalized understanding of medications/d/c instructions.   All belongings (from locker # 40) returned to pt. Q 15 min checks maintained until discharge.  Pt. Left the unit in no apparent distress.

## 2015-04-08 NOTE — Tx Team (Signed)
Interdisciplinary Treatment Plan Update (Adult)  Date:  04/08/2015 Time Reviewed:  10:04 AM  Progress in Treatment: Attending groups: Yes. Participating in groups: Yes. Taking medication as prescribed:  Yes. Tolerating medication:  Yes. Family/Significant othe contact made:  Yes Patient understands diagnosis:  Yes, as evidenced by seeking help with paranoia and "seeing things that scare me" Discussing patient identified problems/goals with staff:  Yes, see initial care plan. Medical problems stabilized or resolved:  Yes Denies suicidal/homicidal ideation: Yes. Issues/concerns per patient self-inventory: No. Other:   Discharge Plan or Barriers:  See below  Pt will return home and receive services from [facility].  Reason for Continuation of Hospitalization:   Comments: [name] [name] is a 34 y.o.Asian male, married , on SSD , has a past hx of schizophrenia , presented as a walk in at [facility] for worsening sx. Patient per initial evaluation in EHR 'reported hearing and seeing things that scare him. Patient also reported that he was diagnosed with Schizophrenia and is compliant with taking his medication. Patient reported that he receives medication management and therapy at [facility]. Patient also reported feeling paranoid. Patient reported that he feels as if the police are after him. His wife miscarried few months ago and his symptoms worsened." Abilify, Celexa, Desyrel trial  Estimated length of stay: D/C today  New goal(s):  Review of initial/current patient goals per problem list:  1. Goal(s): Patient will participate in aftercare plan  Met:Yes  Target date: at discharge  As evidenced by: Patient will participate within aftercare plan AEB aftercare provider and housing plan at discharge being identified. 04/04/15: Pt is agreeable to services with [facility] and signed a release.   2. Goal (s): Patient will exhibit decreased depressive symptoms and suicidal  ideations.  Met:Yes  Target date: at discharge  As evidenced by: Patient will utilize self rating of depression at 3 or below and demonstrate decreased signs of depression or be deemed stable for discharge by MD. 04/04/15: Pt denies SI but still endorses depressive symptoms. Will continue to monitor.  04/08/2015 [name] [name]  Denies depression today  4. Goal(s): Patient will demonstrate decreased signs of psychosis.  Met: Yes  Target date:at discharge  As evidenced by: Patient will demonstrate decreased signs of psychosis as evidenced by a reduction in AVH, paranoia, and/or delusions.   04/04/15: Pt still endorses AH. 04/08/2015 No signs nor symptoms of psychosis    Attendees: Patient:  04/08/2015 10:04 AM  Family:   04/08/2015 10:04 AM  Physician:  Dr. Ursula Alert, MD 04/08/2015 10:04 AM  Nursing:  Manuella Ghazi, RN 04/08/2015 10:04 AM  Case Manager:  Roque Lias, LCSW 04/08/2015 10:04 AM  Counselor:  04/08/2015 10:04 AM  Other:   04/08/2015 10:04 AM  Other:   04/08/2015 10:04 AM  Other:   04/08/2015 10:04 AM  Other:  04/08/2015 10:04 AM  Other:    Other:    Other:    Other:    Other:    Other:      Scribe for Treatment Team:   Georga Kaufmann, MSW Intern 04/08/2015 10:04 AM

## 2015-04-08 NOTE — Progress Notes (Signed)
Patient ID: [name] [name], male   DOB: 1981/03/28, 34 y.o.   MRN: [id number] PER STATE REGULATIONS 482.30  THIS CHART WAS REVIEWED FOR MEDICAL NECESSITY WITH RESPECT TO THE PATIENT'S ADMISSION/ DURATION OF STAY.  NEXT REVIEW DATE: 04/10/2015  Willa Rough, RN, BSN CASE MANAGER

## 2015-04-08 NOTE — Plan of Care (Signed)
Problem: Alteration in mood; excessive anxiety as evidenced by: Goal: STG-Pt will report an absence of self-harm thoughts/actions (Patient will report an absence of self-harm thoughts or actions)  Outcome: Progressing Pt denies SI, intent or plan.

## 2015-04-08 NOTE — Progress Notes (Signed)
D: Pt had family visiting this evening. Mood and affect is appropriate to situation but seemed anxious about possible discharge tomorrow. Pt denies SI/HI/AVH and pain. Cooperative with assessment.    A: Met with pt 1:1. Medications administered as prescribed. Support and encouragement provided. Pt encouraged to continue medication management after discharge.   R: Patient remains safe and complaint with medications.

## 2015-04-16 DIAGNOSIS — F209 Schizophrenia, unspecified: Secondary | ICD-10-CM | POA: Diagnosis not present

## 2015-05-03 ENCOUNTER — Other Ambulatory Visit (INDEPENDENT_AMBULATORY_CARE_PROVIDER_SITE_OTHER): Payer: Self-pay | Admitting: Family

## 2015-06-20 DIAGNOSIS — F209 Schizophrenia, unspecified: Secondary | ICD-10-CM | POA: Diagnosis not present

## 2015-08-03 DIAGNOSIS — F209 Schizophrenia, unspecified: Secondary | ICD-10-CM | POA: Diagnosis not present

## 2015-08-13 DIAGNOSIS — F209 Schizophrenia, unspecified: Secondary | ICD-10-CM | POA: Diagnosis not present

## 2015-09-27 DIAGNOSIS — F209 Schizophrenia, unspecified: Secondary | ICD-10-CM | POA: Diagnosis not present

## 2015-10-18 DIAGNOSIS — F209 Schizophrenia, unspecified: Secondary | ICD-10-CM | POA: Diagnosis not present

## 2015-11-18 DIAGNOSIS — F209 Schizophrenia, unspecified: Secondary | ICD-10-CM | POA: Diagnosis not present

## 2015-11-23 ENCOUNTER — Emergency Department (HOSPITAL_COMMUNITY)
Admission: EM | Admit: 2015-11-23 | Discharge: 2015-11-24 | Disposition: A | Discharge status: Home / Self Care | Payer: Medicare Other | Source: Home / Self Care

## 2015-11-23 ENCOUNTER — Encounter (HOSPITAL_COMMUNITY): Payer: Self-pay | Admitting: Emergency Medicine

## 2015-11-23 DIAGNOSIS — R05 Cough: Secondary | ICD-10-CM

## 2015-11-23 DIAGNOSIS — F329 Major depressive disorder, single episode, unspecified: Secondary | ICD-10-CM

## 2015-11-23 DIAGNOSIS — F17210 Nicotine dependence, cigarettes, uncomplicated: Secondary | ICD-10-CM | POA: Insufficient documentation

## 2015-11-23 DIAGNOSIS — Z5321 Procedure and treatment not carried out due to patient leaving prior to being seen by health care provider: Secondary | ICD-10-CM

## 2015-11-23 DIAGNOSIS — Z79899 Other long term (current) drug therapy: Secondary | ICD-10-CM | POA: Diagnosis not present

## 2015-11-23 LAB — RAPID STREP SCREEN (MED CTR MEBANE ONLY): Streptococcus, Group A Screen (Direct): NEGATIVE

## 2015-11-23 NOTE — ED Notes (Signed)
Pt states he has had a cough x 2-4 days. Pt states he also started smoking again in Dec. Pt states he would like information about quitting smoking. Pt states he coughs up sputum first thing in the morning, and today it was blood tinged. Pt states that he also coughs so hard he experiences emesis.

## 2015-11-24 ENCOUNTER — Encounter (HOSPITAL_COMMUNITY): Payer: Self-pay

## 2015-11-24 ENCOUNTER — Emergency Department (HOSPITAL_COMMUNITY): Payer: Medicare Other

## 2015-11-24 ENCOUNTER — Emergency Department (HOSPITAL_COMMUNITY)
Admission: EM | Admit: 2015-11-24 | Discharge: 2015-11-24 | Disposition: A | Discharge status: Home / Self Care | Payer: Medicare Other | Attending: Emergency Medicine | Admitting: Emergency Medicine

## 2015-11-24 DIAGNOSIS — R05 Cough: Secondary | ICD-10-CM | POA: Diagnosis not present

## 2015-11-24 DIAGNOSIS — R059 Cough, unspecified: Secondary | ICD-10-CM

## 2015-11-24 MED ORDER — FLUTICASONE PROPIONATE 50 MCG/ACT NA SUSP: 2.0000 | Freq: Every day | NASAL | Status: DC

## 2015-11-24 MED ORDER — CETIRIZINE HCL 10 MG PO TABS: 10.0000 mg | ORAL_TABLET | Freq: Every day | ORAL | Status: DC

## 2015-11-24 NOTE — ED Notes (Addendum)
Patient states has been coughing and runny nose x3 days.  Patient states that cough is non-productive.  Denies Asthma, Denies SOB.  Patient advised that began smoking again this past December.  Patient states that throat is sore when he coughs.  Patient states pain is 4-10.  Breathing even and unlabored.  NAD at this time.

## 2015-11-24 NOTE — Discharge Instructions (Signed)
You were seen and evaluated today for your cough. This appears likely secondary to a runny nose and mucus going down the back of your throat. This may be from allergies. Please take the medications prescribed. Follow-up outpatient with the primary care physician in about 1 week to see if your symptoms are improving. Please attempt to quit smoking.  Cough, Adult A cough helps to clear your throat and lungs. A cough may last only 2-3 weeks (acute), or it may last longer than 8 weeks (chronic). Many different things can cause a cough. A cough may be a sign of an illness or another medical condition. HOME CARE  Pay attention to any changes in your cough.  Take medicines only as told by your doctor.  If you were prescribed an antibiotic medicine, take it as told by your doctor. Do not stop taking it even if you start to feel better.  Talk with your doctor before you try using a cough medicine.  Drink enough fluid to keep your pee (urine) clear or pale yellow.  If the air is dry, use a cold steam vaporizer or humidifier in your home.  Stay away from things that make you cough at work or at home.  If your cough is worse at night, try using extra pillows to raise your head up higher while you sleep.  Do not smoke, and try not to be around smoke. If you need help quitting, ask your doctor.  Do not have caffeine.  Do not drink alcohol.  Rest as needed. GET HELP IF:  You have new problems (symptoms).  You cough up yellow fluid (pus).  Your cough does not get better after 2-3 weeks, or your cough gets worse.  Medicine does not help your cough and you are not sleeping well.  You have pain that gets worse or pain that is not helped with medicine.  You have a fever.  You are losing weight and you do not know why.  You have night sweats. GET HELP RIGHT AWAY IF:  You cough up blood.  You have trouble breathing.  Your heartbeat is very fast.   This information is not intended to  replace advice given to you by your health care provider. Make sure you discuss any questions you have with your health care provider.   Document Released: 03/05/2011 Document Revised: 03/13/2015 Document Reviewed: 08/29/2014 Elsevier Interactive Patient Education 2016 [employer].  Smoking Cessation, Tips for Success If you are ready to quit smoking, congratulations! You have chosen to help yourself be healthier. Cigarettes bring nicotine, tar, carbon monoxide, and other irritants into your body. Your lungs, heart, and blood vessels will be able to work better without these poisons. There are many different ways to quit smoking. Nicotine gum, nicotine patches, a nicotine inhaler, or nicotine nasal spray can help with physical craving. Hypnosis, support groups, and medicines help break the habit of smoking. WHAT THINGS CAN I DO TO MAKE QUITTING EASIER?  Here are some tips to help you quit for good:  Pick a date when you will quit smoking completely. Tell all of your friends and family about your plan to quit on that date.  Do not try to slowly cut down on the number of cigarettes you are smoking. Pick a quit date and quit smoking completely starting on that day.  Throw away all cigarettes.   Clean and remove all ashtrays from your home, work, and car.  On a card, write down your reasons for quitting.  Carry the card with you and read it when you get the urge to smoke.  Cleanse your body of nicotine. Drink enough water and fluids to keep your urine clear or pale yellow. Do this after quitting to flush the nicotine from your body.  Learn to predict your moods. Do not let a bad situation be your excuse to have a cigarette. Some situations in your life might tempt you into wanting a cigarette.  Never have "just one" cigarette. It leads to wanting another and another. Remind yourself of your decision to quit.  Change habits associated with smoking. If you smoked while driving or when feeling  stressed, try other activities to replace smoking. Stand up when drinking your coffee. Brush your teeth after eating. Sit in a different chair when you read the paper. Avoid alcohol while trying to quit, and try to drink fewer caffeinated beverages. Alcohol and caffeine may urge you to smoke.  Avoid foods and drinks that can trigger a desire to smoke, such as sugary or spicy foods and alcohol.  Ask people who smoke not to smoke around you.  Have something planned to do right after eating or having a cup of coffee. For example, plan to take a walk or exercise.  Try a relaxation exercise to calm you down and decrease your stress. Remember, you may be tense and nervous for the first 2 weeks after you quit, but this will pass.  Find new activities to keep your hands busy. Play with a pen, coin, or rubber band. Doodle or draw things on paper.  Brush your teeth right after eating. This will help cut down on the craving for the taste of tobacco after meals. You can also try mouthwash.   Use oral substitutes in place of cigarettes. Try using lemon drops, carrots, cinnamon sticks, or chewing gum. Keep them handy so they are available when you have the urge to smoke.  When you have the urge to smoke, try deep breathing.  Designate your home as a nonsmoking area.  If you are a heavy smoker, ask your health care provider about a prescription for nicotine chewing gum. It can ease your withdrawal from nicotine.  Reward yourself. Set aside the cigarette money you save and buy yourself something nice.  Look for support from others. Join a support group or smoking cessation program. Ask someone at home or at work to help you with your plan to quit smoking.  Always ask yourself, "Do I need this cigarette or is this just a reflex?" Tell yourself, "Today, I choose not to smoke," or "I do not want to smoke." You are reminding yourself of your decision to quit.  Do not replace cigarette smoking with  electronic cigarettes (commonly called e-cigarettes). The safety of e-cigarettes is unknown, and some may contain harmful chemicals.  If you relapse, do not give up! Plan ahead and think about what you will do the next time you get the urge to smoke. HOW WILL I FEEL WHEN I QUIT SMOKING? You may have symptoms of withdrawal because your body is used to nicotine (the addictive substance in cigarettes). You may crave cigarettes, be irritable, feel very hungry, cough often, get headaches, or have difficulty concentrating. The withdrawal symptoms are only temporary. They are strongest when you first quit but will go away within 10-14 days. When withdrawal symptoms occur, stay in control. Think about your reasons for quitting. Remind yourself that these are signs that your body is healing and getting used to  being without cigarettes. Remember that withdrawal symptoms are easier to treat than the major diseases that smoking can cause.  Even after the withdrawal is over, expect periodic urges to smoke. However, these cravings are generally short lived and will go away whether you smoke or not. Do not smoke! WHAT RESOURCES ARE AVAILABLE TO HELP ME QUIT SMOKING? Your health care provider can direct you to community resources or hospitals for support, which may include:  Group support.  Education.  Hypnosis.  Therapy.   This information is not intended to replace advice given to you by your health care provider. Make sure you discuss any questions you have with your health care provider.   Document Released: 03/20/2004 Document Revised: 07/13/2014 Document Reviewed: 12/08/2012 Elsevier Interactive Patient Education [employer]

## 2015-11-24 NOTE — ED Provider Notes (Signed)
CSN: [id number]     Arrival date & time 11/24/15  4098 History   First MD Initiated Contact with Patient 11/24/15 [phone]     Chief Complaint  Patient presents with  . Cough     (Consider location/radiation/quality/duration/timing/severity/associated sxs/prior Treatment) HPI Comments: 35 year old male with history of schizophrenia presents for cough. The patient reports that since December when he started smoking again he has had a runny nose. This got worse over over the last few weeks. He says over the last 2-4 days he has had a cough with sputum production. He said today he had a very tiny amount of blood in the sputum when he coughed. That only happened once in his cough since without any bleeding. He said when he gets into really bad coughing fit he will sometimes vomit but that the vomit is always mucus. Denies any chest pain. No fevers or chills. He reports a history of seasonal allergies that usually occur around this time of year.   Past Medical History  Diagnosis Date  . Mental disorder   . Anxiety   . Depression    History reviewed. No pertinent past surgical history. Family History  Problem Relation Age of Onset  . Mental illness Neg Hx    Social History  Substance Use Topics  . Smoking status: Current Every Day Smoker    Types: Cigarettes  . Smokeless tobacco: None  . Alcohol Use: No     Comment: denied all drug/alcohol use at this time    Review of Systems  Constitutional: Negative for fever, chills and fatigue.  HENT: Positive for congestion, postnasal drip, rhinorrhea, sneezing and sore throat (only when coughin). Negative for trouble swallowing and voice change.   Eyes: Negative for visual disturbance.  Respiratory: Positive for cough. Negative for chest tightness, shortness of breath and wheezing.   Cardiovascular: Negative for chest pain, palpitations and leg swelling.  Gastrointestinal: Positive for vomiting (after coughing). Negative for nausea and abdominal  pain.  Genitourinary: Negative for dysuria, urgency and hematuria.  Musculoskeletal: Negative for myalgias and back pain.  Skin: Negative for rash.  Neurological: Negative for dizziness, weakness and headaches.  Hematological: Does not bruise/bleed easily.      Allergies  Review of patient's allergies indicates no known allergies.  Home Medications   Prior to Admission medications   Medication Sig Start Date End Date Taking? Authorizing Provider  benztropine (COGENTIN) 0.5 MG tablet Take 1 tablet (0.5 mg total) by mouth at bedtime. 04/08/15  Yes Beau Fanny, FNP  citalopram (CELEXA) 20 MG tablet Take 1 tablet (20 mg total) by mouth daily. 04/08/15  Yes Beau Fanny, FNP  fenofibrate (TRICOR) 145 MG tablet Take 145 mg by mouth every evening. 09/02/15  Yes Historical Provider, MD  hydrOXYzine (ATARAX/VISTARIL) 25 MG tablet Take 1 tablet (25 mg total) by mouth 3 (three) times daily as needed for anxiety. 04/08/15  Yes Beau Fanny, FNP  Multiple Vitamins-Minerals (MULTI ADULT GUMMIES PO) Take 2 tablets by mouth daily.   Yes Historical Provider, MD  traZODone (DESYREL) 100 MG tablet Take 1 tablet (100 mg total) by mouth at bedtime. 04/08/15  Yes Beau Fanny, FNP  ziprasidone (GEODON) 40 MG capsule Take 40 mg by mouth 2 (two) times daily. 11/18/15  Yes Historical Provider, MD  ARIPiprazole (ABILIFY) 15 MG tablet Take 1 tablet (15 mg total) by mouth at bedtime. Patient not taking: Reported on 11/23/2015 04/08/15   Beau Fanny, FNP  cetirizine (ZYRTEC ALLERGY) 10 MG  tablet Take 1 tablet (10 mg total) by mouth daily. 11/24/15   Leta BaptistEmily Roe Hildagarde Holleran, MD  fluticasone (FLONASE) 50 MCG/ACT nasal spray Place 2 sprays into both nostrils daily. 11/24/15   Leta BaptistEmily Roe Deisi Salonga, MD   BP 106/66 mmHg  Pulse 80  Temp(Src) 98.3 F (36.8 C) (Oral)  Resp 18  Ht 5\' 3"  (1.6 m)  Wt 180 lb (81.647 kg)  BMI 31.89 kg/m2  SpO2 97% Physical Exam  Constitutional: He is oriented to person, place, and time. He  appears well-developed and well-nourished. No distress.  HENT:  Head: Normocephalic and atraumatic.  Right Ear: Tympanic membrane and external ear normal.  Left Ear: Tympanic membrane and external ear normal.  Nose: Mucosal edema and rhinorrhea present. No septal deviation or nasal septal hematoma. No epistaxis. Right sinus exhibits no maxillary sinus tenderness and no frontal sinus tenderness. Left sinus exhibits no maxillary sinus tenderness and no frontal sinus tenderness.  Mouth/Throat: Oropharynx is clear and moist and mucous membranes are normal. No oropharyngeal exudate, posterior oropharyngeal edema, posterior oropharyngeal erythema or tonsillar abscesses.  Postnasal drip noted over the posterior pharynx.  Eyes: EOM are normal. Pupils are equal, round, and reactive to light.  Neck: Normal range of motion. Neck supple.  Cardiovascular: Normal rate, regular rhythm, normal heart sounds and intact distal pulses.   No murmur heard. Pulmonary/Chest: Effort normal. No respiratory distress. He has no wheezes. He has no rales.  Abdominal: Soft. He exhibits no distension. There is no tenderness.  Musculoskeletal: He exhibits no edema.  Neurological: He is alert and oriented to person, place, and time.  Skin: Skin is warm and dry. No rash noted. He is not diaphoretic.  Vitals reviewed.   ED Course  Procedures (including critical care time) Labs Review Labs Reviewed - No data to display  Imaging Review Dg Chest 2 View  11/24/2015  CLINICAL DATA:  Cough EXAM: CHEST  2 VIEW COMPARISON:  None. FINDINGS: Normal heart size. Normal mediastinal contour. No pneumothorax. No pleural effusion. Lungs appear clear, with no acute consolidative airspace disease and no pulmonary edema. IMPRESSION: No active cardiopulmonary disease. Electronically Signed   By: Delbert PhenixJason A Poff M.D.   On: 11/24/2015 09:14   I have personally reviewed and evaluated these images and lab results as part of my medical  decision-making.   EKG Interpretation None      MDM  Patient seen and evaluated in stable condition. Benign examination. Chest x-ray unremarkable. Stable vitals. Presentation with postnasal drip as likely cause of the cough at this time. This appears to likely be secondary to allergies. We'll trial the patient on Zyrtec and Flonase. Instructed the patient to follow up outpatient to see if he is improving. Strict return precautions given. Final diagnoses:  Cough    1. Cough     Leta BaptistEmily Roe Emelio Schneller, MD 11/24/15 [phone]

## 2015-11-26 LAB — CULTURE, GROUP A STREP (THRC)

## 2015-12-24 DIAGNOSIS — F209 Schizophrenia, unspecified: Secondary | ICD-10-CM | POA: Diagnosis not present

## 2016-02-17 DIAGNOSIS — F209 Schizophrenia, unspecified: Secondary | ICD-10-CM | POA: Diagnosis not present

## 2016-02-21 ENCOUNTER — Encounter (HOSPITAL_COMMUNITY): Payer: Self-pay | Admitting: Emergency Medicine

## 2016-02-21 ENCOUNTER — Emergency Department (HOSPITAL_COMMUNITY)
Admission: EM | Admit: 2016-02-21 | Discharge: 2016-02-24 | Disposition: A | Discharge status: Other Acute Inpatient Hospital | Payer: Medicare Other | Attending: Emergency Medicine | Admitting: Emergency Medicine

## 2016-02-21 DIAGNOSIS — Z79899 Other long term (current) drug therapy: Secondary | ICD-10-CM | POA: Diagnosis not present

## 2016-02-21 DIAGNOSIS — R440 Auditory hallucinations: Secondary | ICD-10-CM | POA: Diagnosis not present

## 2016-02-21 DIAGNOSIS — F17210 Nicotine dependence, cigarettes, uncomplicated: Secondary | ICD-10-CM | POA: Insufficient documentation

## 2016-02-21 DIAGNOSIS — F209 Schizophrenia, unspecified: Secondary | ICD-10-CM | POA: Diagnosis present

## 2016-02-21 DIAGNOSIS — Z046 Encounter for general psychiatric examination, requested by authority: Secondary | ICD-10-CM | POA: Diagnosis not present

## 2016-02-21 DIAGNOSIS — Z008 Encounter for other general examination: Secondary | ICD-10-CM

## 2016-02-21 HISTORY — DX: Schizophrenia, unspecified: F20.9

## 2016-02-21 LAB — CBC WITH DIFFERENTIAL/PLATELET
BASOS ABS: 0.1 10*3/uL (ref 0.0–0.1)
BASOS PCT: 1 %
EOS ABS: 0.2 10*3/uL (ref 0.0–0.7)
EOS PCT: 3 %
HCT: 44.8 % (ref 39.0–52.0)
HEMOGLOBIN: 14.7 g/dL (ref 13.0–17.0)
LYMPHS ABS: 1.2 10*3/uL (ref 0.7–4.0)
Lymphocytes Relative: 18 %
MCH: 26.2 pg (ref 26.0–34.0)
MCHC: 32.8 g/dL (ref 30.0–36.0)
MCV: 79.9 fL (ref 78.0–100.0)
Monocytes Absolute: 0.4 10*3/uL (ref 0.1–1.0)
Monocytes Relative: 7 %
NEUTROS PCT: 71 %
Neutro Abs: 4.8 10*3/uL (ref 1.7–7.7)
PLATELETS: 237 10*3/uL (ref 150–400)
RBC: 5.61 MIL/uL (ref 4.22–5.81)
RDW: 13.1 % (ref 11.5–15.5)
WBC: 6.6 10*3/uL (ref 4.0–10.5)

## 2016-02-21 LAB — COMPREHENSIVE METABOLIC PANEL
ALBUMIN: 4.6 g/dL (ref 3.5–5.0)
ALK PHOS: 56 U/L (ref 38–126)
ALT: 62 U/L (ref 17–63)
AST: 48 U/L — AB (ref 15–41)
Anion gap: 7 (ref 5–15)
BUN: 11 mg/dL (ref 6–20)
CALCIUM: 9.9 mg/dL (ref 8.9–10.3)
CHLORIDE: 107 mmol/L (ref 101–111)
CO2: 23 mmol/L (ref 22–32)
CREATININE: 1.17 mg/dL (ref 0.61–1.24)
GFR calc non Af Amer: >60 mL/min (ref >60)
GLUCOSE: 99 mg/dL (ref 65–99)
Potassium: 3.8 mmol/L (ref 3.5–5.1)
SODIUM: 137 mmol/L (ref 135–145)
Total Bilirubin: 0.5 mg/dL (ref 0.3–1.2)
Total Protein: 7.9 g/dL (ref 6.5–8.1)

## 2016-02-21 LAB — RAPID URINE DRUG SCREEN, HOSP PERFORMED
AMPHETAMINES: NOT DETECTED
BARBITURATES: NOT DETECTED
BENZODIAZEPINES: NOT DETECTED
Cocaine: NOT DETECTED
Opiates: NOT DETECTED
TETRAHYDROCANNABINOL: NOT DETECTED

## 2016-02-21 LAB — ETHANOL

## 2016-02-21 MED ORDER — IBUPROFEN 400 MG PO TABS
600.0000 mg | ORAL_TABLET | Freq: Three times a day (TID) | ORAL | Status: DC | PRN
  Administered 2016-02-23: 600 mg via ORAL
  Filled 2016-02-21: qty 1

## 2016-02-21 MED ORDER — THIAMINE HCL 100 MG/ML IJ SOLN: 100.0000 mg | Freq: Every day | INTRAMUSCULAR | Status: DC

## 2016-02-21 MED ORDER — ZIPRASIDONE HCL 20 MG PO CAPS
40.0000 mg | ORAL_CAPSULE | Freq: Two times a day (BID) | ORAL | Status: DC
  Administered 2016-02-22 – 2016-02-24 (×6): 40 mg via ORAL
  Filled 2016-02-21 (×6): qty 2

## 2016-02-21 MED ORDER — LORAZEPAM 1 MG PO TABS: 0.0000 mg | ORAL_TABLET | Freq: Two times a day (BID) | ORAL | Status: DC

## 2016-02-21 MED ORDER — FENOFIBRATE 160 MG PO TABS
160.0000 mg | ORAL_TABLET | Freq: Every evening | ORAL | Status: DC
  Administered 2016-02-22: 160 mg via ORAL
  Filled 2016-02-21 (×2): qty 1

## 2016-02-21 MED ORDER — CITALOPRAM HYDROBROMIDE 10 MG PO TABS
20.0000 mg | ORAL_TABLET | Freq: Every day | ORAL | Status: DC
  Administered 2016-02-22 – 2016-02-24 (×3): 20 mg via ORAL
  Filled 2016-02-21 (×3): qty 2

## 2016-02-21 MED ORDER — ARIPIPRAZOLE 5 MG PO TABS
15.0000 mg | ORAL_TABLET | Freq: Every day | ORAL | Status: DC
  Administered 2016-02-22 – 2016-02-23 (×3): 15 mg via ORAL
  Filled 2016-02-21 (×3): qty 1

## 2016-02-21 MED ORDER — LORATADINE 10 MG PO TABS
10.0000 mg | ORAL_TABLET | Freq: Every day | ORAL | Status: DC
  Administered 2016-02-22 – 2016-02-24 (×3): 10 mg via ORAL
  Filled 2016-02-21 (×3): qty 1

## 2016-02-21 MED ORDER — BENZTROPINE MESYLATE 1 MG PO TABS
0.5000 mg | ORAL_TABLET | Freq: Every day | ORAL | Status: DC
  Administered 2016-02-22 – 2016-02-23 (×3): 0.5 mg via ORAL
  Filled 2016-02-21 (×3): qty 1

## 2016-02-21 MED ORDER — FLUTICASONE PROPIONATE 50 MCG/ACT NA SUSP
2.0000 | Freq: Every evening | NASAL | Status: DC
  Administered 2016-02-22 – 2016-02-23 (×3): 2 via NASAL
  Filled 2016-02-21: qty 16

## 2016-02-21 MED ORDER — HYDROXYZINE HCL 25 MG PO TABS: 25.0000 mg | ORAL_TABLET | Freq: Three times a day (TID) | ORAL | Status: DC | PRN

## 2016-02-21 MED ORDER — ALUM & MAG HYDROXIDE-SIMETH 200-200-20 MG/5ML PO SUSP: 30.0000 mL | ORAL | Status: DC | PRN

## 2016-02-21 MED ORDER — TRAZODONE HCL 100 MG PO TABS
100.0000 mg | ORAL_TABLET | Freq: Every day | ORAL | Status: DC
  Administered 2016-02-22 – 2016-02-23 (×3): 100 mg via ORAL
  Filled 2016-02-21 (×3): qty 1

## 2016-02-21 MED ORDER — LORAZEPAM 1 MG PO TABS
1.0000 mg | ORAL_TABLET | Freq: Three times a day (TID) | ORAL | Status: DC | PRN
  Administered 2016-02-23: 1 mg via ORAL
  Filled 2016-02-21: qty 1

## 2016-02-21 MED ORDER — NICOTINE 21 MG/24HR TD PT24
21.0000 mg | MEDICATED_PATCH | Freq: Every day | TRANSDERMAL | Status: DC
  Administered 2016-02-22 – 2016-02-23 (×3): 21 mg via TRANSDERMAL
  Filled 2016-02-21 (×4): qty 1

## 2016-02-21 MED ORDER — VITAMIN B-1 100 MG PO TABS
100.0000 mg | ORAL_TABLET | Freq: Every day | ORAL | Status: DC
Start: 2016-02-22 — End: 2016-02-24
  Administered 2016-02-22 – 2016-02-24 (×3): 100 mg via ORAL
  Filled 2016-02-21 (×3): qty 1

## 2016-02-21 MED ORDER — LORAZEPAM 1 MG PO TABS
0.0000 mg | ORAL_TABLET | Freq: Four times a day (QID) | ORAL | Status: DC
  Administered 2016-02-22: 1 mg via ORAL
  Filled 2016-02-21: qty 1

## 2016-02-21 MED ORDER — ONDANSETRON HCL 4 MG PO TABS
4.0000 mg | ORAL_TABLET | Freq: Three times a day (TID) | ORAL | Status: DC | PRN
  Administered 2016-02-23: 4 mg via ORAL
  Filled 2016-02-21: qty 1

## 2016-02-21 NOTE — ED Notes (Signed)
Staffing office notified /charge nurse notified for pt.'s sitter ,  purple scrubs given to pt. , security notified to wand pt.  

## 2016-02-21 NOTE — ED Provider Notes (Signed)
[facility]-EMERGENCY DEPT Provider Note   CSN: [id number] Arrival date & time: 02/21/16  1947     History   Chief Complaint Chief Complaint  Patient presents with  . Medical Clearance  . Schizophrenia    HPI [name] [name] is a 35 y.o. male.  [name] [name] is a 35 y.o. male  with a hx of schizophrenia taking Geodon and Cogentin presents to the Emergency Department under IVC with [employer]. He reports persistent episodes of "hearing voices."  He states at times he gets angry and grabs a weapon. He thinks that his happened tonight. He denies suicidal or homicidal ideation. He is unsure if he has visual hallucinations.  He reports regularly drinking alcohol and smoking marijuana. His last drink was this morning.  Level 5 Caveat for Psychiatric disorder.  History is primarily provided by [employer] and the IVC paperwork:  Per [employer] officer who accompanied patient here, pt's sister called 911 after patient was running around his house with a knife. Upon [employer] arrival, patient had a Therapist, artbutcher cleaver in his hand. [employer] talked to him through a window - pt stated that he keeps hearing voices in his head and he is trying to not listen to them. Patient dropped the cleaver and said he was "just having a schizophrenic episode."   Per the IVC paperwork:  "[name] has been diagnosed with schizophrenia and was prescribed medications that he does not take. [name] is hostile and aggressive and excited the home today we will bring a butcher knife. [name] is extremely paranoid and believes that his 34 year old nephew is trying to take his wife. [name] states that he will kill himself. [name] is using alcohol and reports hearing voices that are telling him to hurt himself and others. [name] is threatening family members with knives and gasoline. [name] has a [facility] and is a danger to himself and others."     The history is provided by the patient, medical  records and the police. No language interpreter was used.    Past Medical History:  Diagnosis Date  . Anxiety   . Depression   . Mental disorder   . Schizophrenia [facility](HCC)     Patient Active Problem List   Diagnosis Date Noted  . Undifferentiated schizophrenia (HCC) 04/03/2015  . Cannabis use disorder, moderate, in sustained remission 04/03/2015  . Alcohol use disorder, moderate, in sustained remission (HCC) 04/03/2015    History reviewed. No pertinent surgical history.     Home Medications    Prior to Admission medications   Medication Sig Start Date End Date Taking? Authorizing Provider  ARIPiprazole (ABILIFY) 15 MG tablet Take 1 tablet (15 mg total) by mouth at bedtime. Patient not taking: Reported on 11/23/2015 04/08/15   [name], FNP  benztropine (COGENTIN) 0.5 MG tablet Take 1 tablet (0.5 mg total) by mouth at bedtime. 04/08/15   [name], FNP  cetirizine (ZYRTEC ALLERGY) 10 MG tablet Take 1 tablet (10 mg total) by mouth daily. 11/24/15   [name], MD  citalopram (CELEXA) 20 MG tablet Take 1 tablet (20 mg total) by mouth daily. 04/08/15   [name], FNP  fenofibrate (TRICOR) 145 MG tablet Take 145 mg by mouth every evening. 09/02/15   Historical Provider, MD  fluticasone (FLONASE) 50 MCG/ACT nasal spray Place 2 sprays into both nostrils daily. 11/24/15   [name], MD  hydrOXYzine (ATARAX/VISTARIL) 25 MG tablet Take 1 tablet (25 mg total)  by mouth 3 (three) times daily as needed for anxiety. 04/08/15   Beau Fanny, FNP  Multiple Vitamins-Minerals (MULTI ADULT GUMMIES PO) Take 2 tablets by mouth daily.    Historical Provider, MD  traZODone (DESYREL) 100 MG tablet Take 1 tablet (100 mg total) by mouth at bedtime. 04/08/15   Beau Fanny, FNP  ziprasidone (GEODON) 40 MG capsule Take 40 mg by mouth 2 (two) times daily. 11/18/15   Historical Provider, MD    Family History Family History  Problem Relation Age of Onset  . Mental illness Neg Hx      Social History Social History  Substance Use Topics  . Smoking status: Current Every Day Smoker    Types: Cigarettes  . Smokeless tobacco: Not on file  . Alcohol use No     Allergies   Review of patient's allergies indicates no known allergies.   Review of Systems Review of Systems  Unable to perform ROS: Psychiatric disorder  Constitutional: Negative for fever.  Respiratory: Negative for chest tightness and shortness of breath.   Cardiovascular: Negative for chest pain.  Gastrointestinal: Negative for abdominal pain, nausea and vomiting.  Genitourinary: Negative for difficulty urinating.  Musculoskeletal: Negative for back pain.  Skin: Negative for rash.  Neurological: Negative for headaches.  Psychiatric/Behavioral: Positive for agitation and hallucinations.     Physical Exam Updated Vital Signs BP 145/96 (BP Location: Left Arm)   Pulse 97   Temp 98.1 F (36.7 C) (Oral)   Resp 18   SpO2 98%   Physical Exam  Constitutional: He appears well-developed and well-nourished. No distress.  Awake, alert, nontoxic appearance  HENT:  Head: Normocephalic and atraumatic.  Mouth/Throat: Oropharynx is clear and moist. No oropharyngeal exudate.  Eyes: Conjunctivae are normal. No scleral icterus.  Neck: Normal range of motion. Neck supple.  Cardiovascular: Normal rate, regular rhythm and intact distal pulses.   Pulmonary/Chest: Effort normal and breath sounds normal. No respiratory distress. He has no wheezes.  Equal chest expansion  Abdominal: Soft. Bowel sounds are normal. He exhibits no mass. There is no tenderness. There is no rebound and no guarding.  Fully reducible umbilical hernia  Musculoskeletal: Normal range of motion. He exhibits no edema.  Neurological: He is alert.  Speech is clear and goal oriented Moves extremities without ataxia  Skin: Skin is warm and dry. He is not diaphoretic.  Psychiatric: His mood appears anxious. His speech is rapid and/or  pressured. Thought content is paranoid. He expresses no homicidal and no suicidal ideation. He expresses no suicidal plans and no homicidal plans.  Nursing note and vitals reviewed.    ED Treatments / Results  Labs (all labs ordered are listed, but only abnormal results are displayed) Labs Reviewed  COMPREHENSIVE METABOLIC PANEL - Abnormal; Notable for the following:       Result Value   AST 48 (*)    All other components within normal limits  ETHANOL  URINE RAPID DRUG SCREEN, HOSP PERFORMED  CBC WITH DIFFERENTIAL/PLATELET   Procedures Procedures (including critical care time)  Medications Ordered in ED Medications  LORazepam (ATIVAN) tablet 1 mg (not administered)  ibuprofen (ADVIL,MOTRIN) tablet 600 mg (not administered)  nicotine (NICODERM CQ - dosed in mg/24 hours) patch 21 mg (not administered)  ondansetron (ZOFRAN) tablet 4 mg (not administered)  alum & mag hydroxide-simeth (MAALOX/MYLANTA) 200-200-20 MG/5ML suspension 30 mL (not administered)  thiamine (VITAMIN B-1) tablet 100 mg (not administered)    Or  thiamine (B-1) injection 100 mg (not administered)  LORazepam (ATIVAN) tablet 0-4 mg (not administered)    Followed by  LORazepam (ATIVAN) tablet 0-4 mg (not administered)     Initial Impression / Assessment and Plan / ED Course  I have reviewed the triage vital signs and the nursing notes.  Pertinent labs & imaging results that were available during my care of the patient were reviewed by me and considered in my medical decision making (see chart for details).  Clinical Course  Value Comment By Time  Tetrahydrocannabinol: NONE DETECTED Neg Dierdre ForthHannah Lopez Dentinger, PA-C 08/18 2219  Alcohol, Ethyl (B): <5 neg Dierdre ForthHannah Arayla Kruschke, PA-C 08/18 2220  WBC: 6.6 No leukocytosis and otherwise reassuring. Dahlia ClientHannah Harel Repetto, PA-C 08/18 2220    Pt Here with IVC paperwork. He is clearly paranoid and hallucinating. Unable to give a clear history. Denies somatic symptoms.  Physical exam and lab work reassuring. Patient is medically cleared for TTS evaluation.  Final Clinical Impressions(s) / ED Diagnoses   Final diagnoses:  Medical clearance for psychiatric admission  Auditory hallucinations    New Prescriptions New Prescriptions   No medications on file     Dierdre ForthHannah Glenisha Gundry, PA-C 02/21/16 2223    Vanetta MuldersScott Zackowski, MD 02/21/16 2239

## 2016-02-21 NOTE — ED Notes (Signed)
Patient ambulated to Pod C room with steady gait.

## 2016-02-21 NOTE — BH Assessment (Addendum)
Tele Assessment Note   [name] [name] is an 35 y.o. male presenting involuntarily for assessment. Pt petitioned for IVC by sister. Per IVC:   [name] has been diagnosed with schizophrenia and was prescribed medications that he does not take. [name] is hostile and aggressive and exited the home today wielding a butcher knife. [name] is extremely paranoid and believes that his 76thirteen year old nephew is trying to take his wife. [name] states that he will kill himself. [name] is using alcohol and reports hearing voices that are telling him to hurt himself and others. [name] is threatening family members with knives and gasoline. [name] has a history of commitment to [facility] and is a danger to himself and others. ----- Pt presents as a poor historian with poor insight and judgement. Pt appeared to be expecting difficulty with concentrating.   Pt states "I got mad. I don't know what happened to me". Pt denies suicidal ideation, homicidal ideation and thoughts of harm. Pt reports history of depression. Pt reports current compliance with medications but, also references consumption of alcohol with medications.  Pt reports paranoia and auditory hallucinations. Pt denies command. Pt states "I can't control myself. I can't take care of myself". Pt reports that he has been behaving out of character lately. Pt states "I don't do that knives, and gasoline and chain smoking". Pt reports THC use "like a couple days ago".   Pt BAL <5, UDS negative for all substances   Diagnosis: F25.1 Schizoaffective disorder, depressive type  Past Medical History:  Past Medical History:  Diagnosis Date  . Anxiety   . Depression   . Mental disorder   . Schizophrenia (HCC)     History reviewed. No pertinent surgical history.  Family History:  Family History  Problem Relation Age of Onset  . Mental illness Neg Hx     Social History:  reports that he has been  smoking Cigarettes.  He does not have any smokeless tobacco history on file. He reports that he uses drugs, including Marijuana. He reports that he does not drink alcohol.  Additional Social History:  Alcohol / Drug Use Pain Medications: No abuse reported Prescriptions: No abuse reported Over the Counter: No abuse reported History of alcohol / drug use?: Yes Longest period of sobriety (when/how long): Not Reported Negative Consequences of Use: Personal relationships Withdrawal Symptoms:  (No withdrawal symptoms reported) Substance #1 Name of Substance 1: THC 1 - Age of First Use: Not Reported 1 - Amount (size/oz): Not Reported 1 - Frequency: Not Reported 1 - Duration: Ongoing 1 - Last Use / Amount: "like a couple of days ago" Substance #2 Name of Substance 2: Beer 2 - Age of First Use: Not Reported 2 - Amount (size/oz): Not Reported 2 - Frequency: "once in a while just to calm down or stop me from being angry" 2 - Duration: Ongoing 2 - Last Use / Amount: Not Reported  CIWA: CIWA-Ar BP: 131/78 Pulse Rate: 86 COWS:    PATIENT STRENGTHS: (choose at least two) Average or above average intelligence Supportive family/friends  Allergies: No Known Allergies  Home Medications:  (Not in a hospital admission)  OB/GYN Status:  No LMP for male patient.  General Assessment Data [facility] Assessment: In system Is this a Tele or Face-to-Face Assessment?: Tele Assessment Is this an Initial Assessment or a Re-assessment for this encounter?: Initial Assessment Marital status: Married BairoilMaiden name: NA Is patient pregnant?: No  Pregnancy Status: No Living Arrangements: Parent Can pt return to current living arrangement?: Yes Admission Status: Involuntary Is patient capable of signing voluntary admission?: No Referral Source: Self/Family/Friend Insurance type: Medicare     Crisis Care Plan Living Arrangements: Parent Name of Psychiatrist: None Name of  Therapist: None  Education Status Is patient currently in school?: No Highest grade of school patient has completed: Some College  Risk to self with the past 6 months Suicidal Ideation: No Has patient been a risk to self within the past 6 months prior to admission? : No Suicidal Intent: No Has patient had any suicidal intent within the past 6 months prior to admission? : No Is patient at risk for suicide?: No Suicidal Plan?: No Access to Means: No What has been your use of drugs/alcohol within the last 12 months?: Pt reports use of alcohol and THC Previous Attempts/Gestures: No Other Self Harm Risks: Non-compliance with medications, psychosis Intentional Self Injurious Behavior:  (Not Reported) Family Suicide History: Unable to assess Recent stressful life event(s):  (Pt refercences multiple stressful events but, no details) Persecutory voices/beliefs?: Yes Depression: Yes Depression Symptoms: Feeling angry/irritable, Guilt Substance abuse history and/or treatment for substance abuse?: Yes Suicide prevention information given to non-admitted patients: Not applicable  Risk to Others within the past 6 months Homicidal Ideation: No Does patient have any lifetime risk of violence toward others beyond the six months prior to admission? : No Thoughts of Harm to Others: No Current Homicidal Intent: No Current Homicidal Plan: No Access to Homicidal Means: No History of harm to others?: No Assessment of Violence: On admission Violent Behavior Description: threatenin to harm others with knvies and gasoline per IVC Does patient have access to weapons?: Yes (Comment) (access to knives) Criminal Charges Pending?: No Does patient have a court date: No Is patient on probation?: No  Psychosis Hallucinations: Auditory (pt denies command) Delusions: Persecutory  Mental Status Report Appearance/Hygiene: In scrubs Eye Contact: Good Motor Activity: Unremarkable Speech: Other (Comment)  (coherent) Level of Consciousness: Alert Mood: Anxious Affect: Anxious, Appropriate to circumstance Anxiety Level: Moderate Thought Processes: Relevant, Coherent Judgement: Impaired Orientation: Person, Place, Time, Situation Obsessive Compulsive Thoughts/Behaviors: Minimal  Cognitive Functioning Concentration: Decreased Memory: Remote Intact, Recent Intact IQ: Average Insight: Poor Impulse Control: Poor Appetite: Fair Weight Loss: 15 (since daughter's death in Nov 30, 2013) Weight Gain: 0 Sleep:  (some change in sleep patterns) Total Hours of Sleep:  (unable to quantify) Vegetative Symptoms: None  ADLScreening [facility] Assessment Services) Patient's cognitive ability adequate to safely complete daily activities?: Yes Patient able to express need for assistance with ADLs?: Yes Independently performs ADLs?: Yes (appropriate for developmental age)  Prior Inpatient Therapy Prior Inpatient Therapy: Yes Prior Therapy Dates: Multiple admissions, last admission 9/16 Prior Therapy Facilty/Provider(s): [facility] Reason for Treatment: Schizophrenia, Hallucinations  Prior Outpatient Therapy Prior Outpatient Therapy: Yes Prior Therapy Dates: last attended 2.2017 Prior Therapy Facilty/Provider(s): Not Reported Reason for Treatment: Schizophrenia Does patient have an ACCT team?: No Does patient have Intensive In-House Services?  : No Does patient have [facility] services? : Unknown Does patient have P4CC services?: No  ADL Screening (condition at time of admission) Patient's cognitive ability adequate to safely complete daily activities?: Yes Is the patient deaf or have difficulty hearing?: No Does the patient have difficulty seeing, even when wearing glasses/contacts?: No Does the patient have difficulty concentrating, remembering, or making decisions?: Yes Patient able to express need for assistance with ADLs?: Yes Does the patient have difficulty dressing or bathing?: No Independently performs  ADLs?: Yes (appropriate for developmental age) Does the patient have difficulty walking or climbing stairs?: No Weakness of Legs: None Weakness of Arms/Hands: None  Home Assistive Devices/Equipment Home Assistive Devices/Equipment: None  Therapy Consults (therapy consults require a physician order) PT Evaluation Needed: No OT Evalulation Needed: No SLP Evaluation Needed: No Abuse/Neglect Assessment (Assessment to be complete while patient is alone) Physical Abuse: Denies Verbal Abuse: Denies Sexual Abuse: Denies Exploitation of patient/patient's resources: Denies Self-Neglect: Denies Values / Beliefs Cultural Requests During Hospitalization: None Spiritual Requests During Hospitalization: None Consults Spiritual Care Consult Needed: No Social Work Consult Needed: No Merchant navy officerAdvance Directives (For Healthcare) Does patient have an advance directive?: No Would patient like information on creating an advanced directive?: No - patient declined information    Additional Information 1:1 In Past 12 Months?: Yes CIRT Risk: Yes Elopement Risk: No Does patient have medical clearance?: No     Disposition: Per Maryjean Mornharles Kober, PA pt meets criteria for inpatient admission. Clinician [facility] T[facility] to seek placement. Nica, RN informed of pt disposition. Disposition Initial Assessment Completed for this Encounter: Yes Disposition of Patient: Other dispositions Other disposition(s): Other (Comment) (Pending Psychiatric Recommendation)  Oluwatosin Higginson J [place] 02/21/2016 11:33 PM

## 2016-02-21 NOTE — ED Notes (Signed)
Patient at the nursing station to call wife. Discussed 5 minute limit. Received call from behavioral health, patient meets inpatient criteria, seeking bed placement at this time.

## 2016-02-21 NOTE — ED Notes (Signed)
TTS in process 

## 2016-02-21 NOTE — ED Triage Notes (Addendum)
Pt. arrived with [employer] officers , family reported change in pt.'s behavior this week , irritable / restless , pt. stated " having weird dreams/can't relax" , denies suicidal ideation or hallucinations . Pt. has IVC documents .

## 2016-02-21 NOTE — ED Notes (Signed)
Per [employer] officer who accompanied patient here, pt's sister called 911 after patient was running around his house with a knife. Upon [employer] arrival, patient had a Therapist, artbutcher cleaver in his hand. [employer] talked to him through a window - pt stated that he keeps hearing voices in his head and he is trying to not listen to them. Patient dropped the cleaver and said he was "just having a schizophrenic episode." Patient IVC'd, is calm and cooperative at this time.

## 2016-02-21 NOTE — ED Notes (Signed)
PA-C at bedside, talking with patient.

## 2016-02-21 NOTE — ED Notes (Signed)
Called hannah, pa-c, for night time medications.

## 2016-02-22 DIAGNOSIS — R440 Auditory hallucinations: Secondary | ICD-10-CM | POA: Diagnosis not present

## 2016-02-22 NOTE — ED Notes (Signed)
Called pharmacy to have meds verified and sent.

## 2016-02-22 NOTE — ED Notes (Signed)
Pt lying on bed watching tv.

## 2016-02-22 NOTE — ED Notes (Signed)
On phone at nurses' desk. Sitter w/pt.

## 2016-02-22 NOTE — ED Notes (Signed)
Pt states that he believes someone will come here and shoot him. He states that he does not understand why he is thinking this. Nurse reoriented pt and ensured that he was safe. Pt was able to calm down.

## 2016-02-22 NOTE — ED Notes (Signed)
1st exam and recommendation completed by Dr Estell HarpinZammit - copy of complete set of IVC papers sent to medical records, original 1st exam placed in folder for magistrate - other 3 copies on chart.

## 2016-02-22 NOTE — ED Notes (Signed)
Per Idalia NeedlePaige, [facility] - pt's information has been sent out to several psych facilities. Pt sitting on chair watching tv.

## 2016-02-22 NOTE — ED Notes (Signed)
Pt ambulating in hallway around nurses' desk w/sitter.

## 2016-02-22 NOTE — ED Notes (Signed)
Pt up to bathroom.

## 2016-02-22 NOTE — ED Notes (Signed)
Talking w/ sitter 

## 2016-02-22 NOTE — ED Notes (Signed)
Pt observed apologizing to sitter for cursing at her earlier.

## 2016-02-23 DIAGNOSIS — R440 Auditory hallucinations: Secondary | ICD-10-CM | POA: Diagnosis not present

## 2016-02-23 NOTE — ED Notes (Signed)
Pt requested to be able to take a shower. Gave him towels, washcloths, toiletries, and fresh scrubs.

## 2016-02-23 NOTE — ED Notes (Signed)
Pt ambulating in halls.  States he had a bad dream.  Pt calm and cooperative

## 2016-02-23 NOTE — ED Notes (Signed)
Pt ambulated around nurses' desk after asking if OK to do so. Pt then called his parents who advised they are coming to visit. Pt returned to room and lying on bed.

## 2016-02-23 NOTE — ED Notes (Signed)
Ambulating around nurses' desk - sitter w/pt.

## 2016-02-23 NOTE — ED Notes (Signed)
Per Fulton Reekrystal Williams, RN - [facility]. Pt [facility] - may arrive 8/21 (Mon) after 1000. Accepting Dr Lowella PettiesMazzaglia. Call report to [phone] when pt is being transported.

## 2016-02-23 NOTE — ED Notes (Signed)
Pt talking w/sitter.  

## 2016-02-23 NOTE — ED Notes (Signed)
Irving BurtonEmily, [facility], aware [facility] and may arrive tomorrow [facility](Mon - 8/21) after 1000.

## 2016-02-23 NOTE — ED Notes (Addendum)
Family visiting w/pt - brought pt 1 bag of clothing - placed at nurses' desk for inventory. Pt has glasses and Bible they brought for him at nurses' desk.

## 2016-02-23 NOTE — ED Notes (Signed)
Pt sitting on bed talking w/sitter. 

## 2016-02-23 NOTE — ED Notes (Addendum)
Pt woke up to us the restroom. Could hear a lot of coughing and retching sound at nurse's station. When asked, pt stated that he felt nauseous and thinks he "ate too much spinach".

## 2016-02-23 NOTE — ED Notes (Signed)
Pt given snack by NT.  

## 2016-02-23 NOTE — ED Notes (Signed)
Pt states his spouse is coming to visit at 1730 and will obtain his home meds at that time.

## 2016-02-23 NOTE — ED Notes (Signed)
Pt aware [facility] and may arrive tomorrow (8/21) after 1000. [facility] printed and given to pt. Pt called his parents to advise.

## 2016-02-24 DIAGNOSIS — R440 Auditory hallucinations: Secondary | ICD-10-CM | POA: Diagnosis not present

## 2016-02-24 NOTE — ED Notes (Signed)
Sheriff arrived to transport patient to [facility]. Pt ambulatory with steady gait. Belongings given to [employer] [place] Life Insurancesheriff O'Brien.

## 2016-02-24 NOTE — ED Notes (Signed)
Pt up to nurses station to use telephone.

## 2016-02-24 NOTE — ED Notes (Signed)
Pt eating breakfast. Made aware of [facility] by Kerr-McGeesheriff. Sheriff notified and is to arrive after 9 am. [facility].

## 2016-02-24 NOTE — ED Notes (Signed)
Pt requested to take morning shower.

## 2016-02-24 NOTE — ED Provider Notes (Signed)
  Physical Exam  BP 122/77 (BP Location: Left Arm)   Pulse 84   Temp 98.9 F (37.2 C) (Oral)   Resp 18   SpO2 97%   Physical Exam  ED Course  Procedures  MDM Transfer to  [facility], Dr Vivien RotaMazzagina       Evellyn Tuff, MD 02/24/16 [phone]

## 2016-02-24 NOTE — ED Notes (Signed)
Report given to RN Nicholos JohnsKathleen at [facility]. Pt to be transported upon GilmanSheriff arrival. Pt remains calm and cooperative.

## 2016-03-16 DIAGNOSIS — F209 Schizophrenia, unspecified: Secondary | ICD-10-CM | POA: Diagnosis not present

## 2016-03-17 DIAGNOSIS — H04123 Dry eye syndrome of bilateral lacrimal glands: Secondary | ICD-10-CM | POA: Diagnosis not present

## 2016-03-17 DIAGNOSIS — G44219 Episodic tension-type headache, not intractable: Secondary | ICD-10-CM | POA: Diagnosis not present

## 2016-04-08 ENCOUNTER — Ambulatory Visit (HOSPITAL_COMMUNITY)
Admission: EM | Admit: 2016-04-08 | Discharge: 2016-04-08 | Discharge status: Against Medical Advice | Payer: Medicare Other

## 2016-04-08 NOTE — ED Notes (Signed)
No answer x2 in lobby  

## 2016-04-20 DIAGNOSIS — F209 Schizophrenia, unspecified: Secondary | ICD-10-CM | POA: Diagnosis not present

## 2016-04-22 DIAGNOSIS — K625 Hemorrhage of anus and rectum: Secondary | ICD-10-CM | POA: Diagnosis not present

## 2016-04-22 DIAGNOSIS — Z13220 Encounter for screening for lipoid disorders: Secondary | ICD-10-CM | POA: Diagnosis not present

## 2016-04-22 DIAGNOSIS — F200 Paranoid schizophrenia: Secondary | ICD-10-CM | POA: Diagnosis not present

## 2016-04-22 DIAGNOSIS — F17200 Nicotine dependence, unspecified, uncomplicated: Secondary | ICD-10-CM | POA: Diagnosis not present

## 2016-04-22 DIAGNOSIS — Z79899 Other long term (current) drug therapy: Secondary | ICD-10-CM | POA: Diagnosis not present

## 2016-04-22 DIAGNOSIS — Z0000 Encounter for general adult medical examination without abnormal findings: Secondary | ICD-10-CM | POA: Diagnosis not present

## 2016-04-22 DIAGNOSIS — Z23 Encounter for immunization: Secondary | ICD-10-CM | POA: Diagnosis not present

## 2016-04-30 DIAGNOSIS — F209 Schizophrenia, unspecified: Secondary | ICD-10-CM | POA: Diagnosis not present

## 2016-05-07 ENCOUNTER — Ambulatory Visit (HOSPITAL_COMMUNITY): Admission: EM | Admit: 2016-05-07 | Payer: Medicare Other | Source: Intra-hospital | Admitting: Psychiatry

## 2016-05-07 ENCOUNTER — Emergency Department (HOSPITAL_COMMUNITY)
Admission: EM | Admit: 2016-05-07 | Discharge: 2016-05-07 | Disposition: A | Discharge status: Home / Self Care | Payer: Medicare Other | Attending: Emergency Medicine | Admitting: Emergency Medicine

## 2016-05-07 DIAGNOSIS — F332 Major depressive disorder, recurrent severe without psychotic features: Secondary | ICD-10-CM | POA: Insufficient documentation

## 2016-05-07 DIAGNOSIS — F209 Schizophrenia, unspecified: Secondary | ICD-10-CM | POA: Diagnosis not present

## 2016-05-07 DIAGNOSIS — F250 Schizoaffective disorder, bipolar type: Secondary | ICD-10-CM | POA: Diagnosis present

## 2016-05-07 DIAGNOSIS — F1410 Cocaine abuse, uncomplicated: Secondary | ICD-10-CM | POA: Diagnosis not present

## 2016-05-07 DIAGNOSIS — F1221 Cannabis dependence, in remission: Secondary | ICD-10-CM

## 2016-05-07 DIAGNOSIS — F1290 Cannabis use, unspecified, uncomplicated: Secondary | ICD-10-CM | POA: Diagnosis not present

## 2016-05-07 DIAGNOSIS — F17210 Nicotine dependence, cigarettes, uncomplicated: Secondary | ICD-10-CM | POA: Diagnosis not present

## 2016-05-07 DIAGNOSIS — F1021 Alcohol dependence, in remission: Secondary | ICD-10-CM

## 2016-05-07 DIAGNOSIS — R45851 Suicidal ideations: Secondary | ICD-10-CM | POA: Diagnosis present

## 2016-05-07 DIAGNOSIS — R45850 Homicidal ideations: Secondary | ICD-10-CM | POA: Diagnosis not present

## 2016-05-07 DIAGNOSIS — Z79899 Other long term (current) drug therapy: Secondary | ICD-10-CM | POA: Insufficient documentation

## 2016-05-07 LAB — CBC
HCT: 45.2 % (ref 39.0–52.0)
HEMOGLOBIN: 15.2 g/dL (ref 13.0–17.0)
MCH: 27 pg (ref 26.0–34.0)
MCHC: 33.6 g/dL (ref 30.0–36.0)
MCV: 80.1 fL (ref 78.0–100.0)
PLATELETS: 290 10*3/uL (ref 150–400)
RBC: 5.64 MIL/uL (ref 4.22–5.81)
RDW: 13.4 % (ref 11.5–15.5)
WBC: 10.1 10*3/uL (ref 4.0–10.5)

## 2016-05-07 LAB — RAPID URINE DRUG SCREEN, HOSP PERFORMED
AMPHETAMINES: NOT DETECTED
Barbiturates: NOT DETECTED
Benzodiazepines: NOT DETECTED
Cocaine: POSITIVE — AB
OPIATES: NOT DETECTED
TETRAHYDROCANNABINOL: NOT DETECTED

## 2016-05-07 LAB — COMPREHENSIVE METABOLIC PANEL
ALK PHOS: 54 U/L (ref 38–126)
ALT: 49 U/L (ref 17–63)
AST: 30 U/L (ref 15–41)
Albumin: 5 g/dL (ref 3.5–5.0)
Anion gap: 6 (ref 5–15)
BUN: 11 mg/dL (ref 6–20)
CALCIUM: 9.5 mg/dL (ref 8.9–10.3)
CO2: 26 mmol/L (ref 22–32)
CREATININE: 1.1 mg/dL (ref 0.61–1.24)
Chloride: 103 mmol/L (ref 101–111)
Glucose, Bld: 119 mg/dL — ABNORMAL HIGH (ref 65–99)
Potassium: 4 mmol/L (ref 3.5–5.1)
Sodium: 135 mmol/L (ref 135–145)
Total Bilirubin: 0.6 mg/dL (ref 0.3–1.2)
Total Protein: 8.8 g/dL — ABNORMAL HIGH (ref 6.5–8.1)

## 2016-05-07 LAB — ACETAMINOPHEN LEVEL: Acetaminophen (Tylenol), Serum: <10 ug/mL — ABNORMAL LOW (ref 10–30)

## 2016-05-07 LAB — ETHANOL

## 2016-05-07 LAB — SALICYLATE LEVEL

## 2016-05-07 MED ORDER — ALUM & MAG HYDROXIDE-SIMETH 200-200-20 MG/5ML PO SUSP: 30.0000 mL | ORAL | Status: DC | PRN

## 2016-05-07 MED ORDER — GABAPENTIN 100 MG PO CAPS: 200.0000 mg | ORAL_CAPSULE | Freq: Two times a day (BID) | ORAL | 0 refills | Status: DC

## 2016-05-07 MED ORDER — GABAPENTIN 100 MG PO CAPS
200.0000 mg | ORAL_CAPSULE | Freq: Two times a day (BID) | ORAL | Status: DC
  Administered 2016-05-07: 200 mg via ORAL
  Filled 2016-05-07: qty 2

## 2016-05-07 MED ORDER — ONDANSETRON HCL 4 MG PO TABS: 4.0000 mg | ORAL_TABLET | Freq: Three times a day (TID) | ORAL | Status: DC | PRN

## 2016-05-07 MED ORDER — GABAPENTIN 100 MG PO CAPS: 200.0000 mg | ORAL_CAPSULE | Freq: Two times a day (BID) | ORAL | 1 refills | Status: DC

## 2016-05-07 MED ORDER — LORAZEPAM 1 MG PO TABS
1.0000 mg | ORAL_TABLET | Freq: Three times a day (TID) | ORAL | Status: DC | PRN
  Administered 2016-05-07: 1 mg via ORAL
  Filled 2016-05-07: qty 1

## 2016-05-07 MED ORDER — ACETAMINOPHEN 325 MG PO TABS: 650.0000 mg | ORAL_TABLET | ORAL | Status: DC | PRN

## 2016-05-07 MED ORDER — IBUPROFEN 200 MG PO TABS: 600.0000 mg | ORAL_TABLET | Freq: Three times a day (TID) | ORAL | Status: DC | PRN

## 2016-05-07 MED ORDER — ZOLPIDEM TARTRATE 5 MG PO TABS: 5.0000 mg | ORAL_TABLET | Freq: Every evening | ORAL | Status: DC | PRN

## 2016-05-07 NOTE — Consult Note (Signed)
[facility] Consult   Reason for Consult:  Suicidal/homicidal ideations with cocaine abuse Referring Physician:  EDP Patient Identification: [name] [name] MRN:  [id number] Principal Diagnosis: Schizoaffective disorder, bipolar type [facility]) Diagnosis:   Patient Active Problem List   Diagnosis Date Noted  . Schizoaffective disorder, bipolar type ([place]) [F25.0] 05/07/2016    Priority: High  . Cocaine abuse [F14.10] 05/07/2016    Priority: High  . Cannabis use disorder, moderate, in sustained remission ([place]) [F12.21] 04/03/2015    Priority: High  . Alcohol use disorder, moderate, in sustained remission ([place]) [F10.21] 04/03/2015    Priority: High    Total Time spent with patient: 45 minutes  Subjective:   [name] [name] is a 35 y.o. male patient does not warrant admission.  HPI:  35 yo male who presented to the ED after using cocaine and having suicidal/homicidal ideations.  He was at a facility in [place] 2 months ago and follows up at [place], next appointment in January.  Today on assessment, he denies suicidal/homicidal ideations, hallucinations, and withdrawal symptoms.  Reports he uses drugs when he gets bored, cocaine and marijuana.  He works and he and his wife live with his parents.  Stable for discharge and follow up with [facility], substance abuse resources provided.  Mr. [name] has Rx for his psychiatric medications.  Past Psychiatric History: schizoaffective disorder  Risk to Self: Suicidal Ideation: Yes-Currently Present Suicidal Intent: No Is patient at risk for suicide?: Yes Suicidal Plan?: Yes-Currently Present Specify Current Suicidal Plan: Shoot Self Access to Means: Yes Specify Access to Suicidal Means: Access to weapons What has been your use of drugs/alcohol within the last 12 months?: Pt chart notes history of drug use Other Self Harm Risks: Paranoia Intentional Self Injurious Behavior: None (Pt denies) Risk to Others: Homicidal Ideation: No-Not  Currently/Within Last 6 Months Thoughts of Harm to Others: No-Not Currently Present/Within Last 6 Months Current Homicidal Intent: No Current Homicidal Plan: No (Pt denies) Access to Homicidal Means: Yes Describe Access to Homicidal Means: Pt reports access to weapons Identified Victim: None Identified History of harm to others?: Yes Assessment of Violence: On admission Violent Behavior Description: Pt reports h/o aggression to objects. IVC reports h/o of aggression. Does patient have access to weapons?: Yes (Comment) Criminal Charges Pending?:  (uta) Does patient have a court date:  [facility]) Prior Inpatient Therapy: Prior Inpatient Therapy: Yes Prior Therapy Dates: Multiple Prior Therapy Facilty/Provider(s): [facility], Not Reported Reason for Treatment: Schizophrenia, Substance Use Prior Outpatient Therapy: Prior Outpatient Therapy: Yes Prior Therapy Dates: Pt denies current OPT yet, states he last attended on last week Prior Therapy Facilty/Provider(s): [facility] (located off of [address]) Reason for Treatment: Schizophrenia Does patient have an ACCT team?: No Does patient have Intensive In-House Services?  : No Does patient have [facility] services? : Yes Does patient have [facility] services?: No  Past Medical History:  Past Medical History:  Diagnosis Date  . Anxiety   . Depression   . Mental disorder   . Schizophrenia ([place])    No past surgical history on file. Family History:  Family History  Problem Relation Age of Onset  . Mental illness Neg Hx    Family Psychiatric  History: none Social History:  History  Alcohol Use No     History  Drug Use  . Types: Marijuana    Social History   Social History  . Marital status: Single    Spouse name: N/A  . Number of children: N/A  . Years of  education: N/A   Social History Main Topics  . Smoking status: Current Every Day Smoker    Types: Cigarettes  . Smokeless tobacco: Not on file  . Alcohol use No  . Drug use:      Types: Marijuana  . Sexual activity: Not on file   Other Topics Concern  . Not on file   Social History Narrative  . No narrative on file   Additional Social History:    Allergies:  No Known Allergies  Labs:  Results for orders placed or performed during the hospital encounter of 05/07/16 (from the past 48 hour(s))  Comprehensive metabolic panel     Status: Abnormal   Collection Time: 05/07/16  1:28 AM  Result Value Ref Range   Sodium 135 135 - 145 mmol/L   Potassium 4.0 3.5 - 5.1 mmol/L   Chloride 103 101 - 111 mmol/L   CO2 26 22 - 32 mmol/L   Glucose, Bld 119 (H) 65 - 99 mg/dL   BUN 11 6 - 20 mg/dL   Creatinine, Ser 1.10 0.61 - 1.24 mg/dL   Calcium 9.5 8.9 - 10.3 mg/dL   Total Protein 8.8 (H) 6.5 - 8.1 g/dL   Albumin 5.0 3.5 - 5.0 g/dL   AST 30 15 - 41 U/L   ALT 49 17 - 63 U/L   Alkaline Phosphatase 54 38 - 126 U/L   Total Bilirubin 0.6 0.3 - 1.2 mg/dL   GFR calc non Af Amer >60 >60 mL/min   GFR calc Af Amer >60 >60 mL/min    Comment: (NOTE) The eGFR has been calculated using the CKD EPI equation. This calculation has not been validated in all clinical situations. eGFR's persistently <60 mL/min signify possible Chronic Kidney Disease.    Anion gap 6 5 - 15  Ethanol     Status: None   Collection Time: 05/07/16  1:28 AM  Result Value Ref Range   Alcohol, Ethyl (B) <5 <5 mg/dL    Comment:        LOWEST DETECTABLE LIMIT FOR SERUM ALCOHOL IS 5 mg/dL FOR MEDICAL PURPOSES ONLY   Salicylate level     Status: None   Collection Time: 05/07/16  1:28 AM  Result Value Ref Range   Salicylate Lvl <7.4 2.8 - 30.0 mg/dL  Acetaminophen level     Status: Abnormal   Collection Time: 05/07/16  1:28 AM  Result Value Ref Range   Acetaminophen (Tylenol), Serum <10 (L) 10 - 30 ug/mL    Comment:        THERAPEUTIC CONCENTRATIONS VARY SIGNIFICANTLY. A RANGE OF 10-30 ug/mL MAY BE AN EFFECTIVE CONCENTRATION FOR MANY PATIENTS. HOWEVER, SOME ARE BEST TREATED AT CONCENTRATIONS  OUTSIDE THIS RANGE. ACETAMINOPHEN CONCENTRATIONS >150 ug/mL AT 4 HOURS AFTER INGESTION AND >50 ug/mL AT 12 HOURS AFTER INGESTION ARE OFTEN ASSOCIATED WITH TOXIC REACTIONS.   cbc     Status: None   Collection Time: 05/07/16  1:28 AM  Result Value Ref Range   WBC 10.1 4.0 - 10.5 K/uL   RBC 5.64 4.22 - 5.81 MIL/uL   Hemoglobin 15.2 13.0 - 17.0 g/dL   HCT 45.2 39.0 - 52.0 %   MCV 80.1 78.0 - 100.0 fL   MCH 27.0 26.0 - 34.0 pg   MCHC 33.6 30.0 - 36.0 g/dL   RDW 13.4 11.5 - 15.5 %   Platelets 290 150 - 400 K/uL  Rapid urine drug screen (hospital performed)     Status: Abnormal   Collection Time:  05/07/16  2:21 AM  Result Value Ref Range   Opiates NONE DETECTED NONE DETECTED   Cocaine POSITIVE (A) NONE DETECTED   Benzodiazepines NONE DETECTED NONE DETECTED   Amphetamines NONE DETECTED NONE DETECTED   Tetrahydrocannabinol NONE DETECTED NONE DETECTED   Barbiturates NONE DETECTED NONE DETECTED    Comment:        DRUG SCREEN FOR MEDICAL PURPOSES ONLY.  IF CONFIRMATION IS NEEDED FOR ANY PURPOSE, NOTIFY LAB WITHIN 5 DAYS.        LOWEST DETECTABLE LIMITS FOR URINE DRUG SCREEN Drug Class       Cutoff (ng/mL) Amphetamine      1000 Barbiturate      200 Benzodiazepine   073 Tricyclics       710 Opiates          300 Cocaine          300 THC              50     Current Facility-Administered Medications  Medication Dose Route Frequency Provider Last Rate Last Dose  . acetaminophen (TYLENOL) tablet 650 mg  650 mg Oral Q4H PRN April Palumbo, MD      . alum & mag hydroxide-simeth (MAALOX/MYLANTA) 200-200-20 MG/5ML suspension 30 mL  30 mL Oral PRN April Palumbo, MD      . gabapentin (NEURONTIN) capsule 200 mg  200 mg Oral BID Corena Pilgrim, MD      . ibuprofen (ADVIL,MOTRIN) tablet 600 mg  600 mg Oral Q8H PRN April Palumbo, MD      . LORazepam (ATIVAN) tablet 1 mg  1 mg Oral Q8H PRN April Palumbo, MD   1 mg at 05/07/16 6269  . ondansetron (ZOFRAN) tablet 4 mg  4 mg Oral Q8H PRN April  Palumbo, MD       Current Outpatient Prescriptions  Medication Sig Dispense Refill  . divalproex (DEPAKOTE ER) 500 MG 24 hr tablet Take 1 tablet by mouth at bedtime.    . haloperidol (HALDOL) 5 MG tablet Take 5 mg by mouth daily.    . ziprasidone (GEODON) 80 MG capsule Take 1 capsule by mouth 3 (three) times daily.    . benztropine (COGENTIN) 0.5 MG tablet Take 1 tablet (0.5 mg total) by mouth at bedtime. (Patient not taking: Reported on 02/22/2016) 30 tablet 0  . cetirizine (ZYRTEC ALLERGY) 10 MG tablet Take 1 tablet (10 mg total) by mouth daily. (Patient not taking: Reported on 02/22/2016) 30 tablet 0  . citalopram (CELEXA) 20 MG tablet Take 1 tablet (20 mg total) by mouth daily. (Patient not taking: Reported on 02/22/2016) 30 tablet 0  . fluticasone (FLONASE) 50 MCG/ACT nasal spray Place 2 sprays into both nostrils daily. (Patient not taking: Reported on 05/07/2016) 15.8 g 0  . hydrOXYzine (ATARAX/VISTARIL) 25 MG tablet Take 1 tablet (25 mg total) by mouth 3 (three) times daily as needed for anxiety. (Patient not taking: Reported on 02/22/2016) 30 tablet 0  . traZODone (DESYREL) 100 MG tablet Take 1 tablet (100 mg total) by mouth at bedtime. (Patient not taking: Reported on 02/22/2016) 30 tablet 0    Musculoskeletal: Strength & Muscle Tone: within normal limits Gait & Station: normal Patient leans: N/A  Psychiatric Specialty Exam: Physical Exam  Constitutional: He is oriented to person, place, and time. He appears well-developed and well-nourished.  HENT:  Head: Normocephalic.  Neck: Normal range of motion.  Respiratory: Effort normal.  Musculoskeletal: Normal range of motion.  Neurological: He is alert and oriented  to person, place, and time.  Skin: Skin is warm and dry.  Psychiatric: He has a normal mood and affect. His speech is normal and behavior is normal. Judgment and thought content normal. Cognition and memory are normal.    Review of Systems  Constitutional: Negative.   HENT:  Negative.   Eyes: Negative.   Respiratory: Negative.   Cardiovascular: Negative.   Gastrointestinal: Negative.   Genitourinary: Negative.   Musculoskeletal: Negative.   Skin: Negative.   Neurological: Negative.   Endo/Heme/Allergies: Negative.   Psychiatric/Behavioral: Positive for substance abuse.    Blood pressure 148/93, pulse 94, temperature 98.8 F (37.1 C), temperature source Oral, resp. rate 18, SpO2 100 %.There is no height or weight on file to calculate BMI.  General Appearance: Casual  Eye Contact:  Good  Speech:  Normal Rate  Volume:  Normal  Mood:  Euthymic  Affect:  Blunt  Thought Process:  Coherent and Descriptions of Associations: Intact  Orientation:  Full (Time, Place, and Person)  Thought Content:  WDL  Suicidal Thoughts:  No  Homicidal Thoughts:  No  Memory:  Immediate;   Good Recent;   Good Remote;   Good  Judgement:  Fair  Insight:  Fair  Psychomotor Activity:  Normal  Concentration:  Concentration: Good and Attention Span: Good  Recall:  Good  Fund of Knowledge:  Fair  Language:  Good  Akathisia:  No  Handed:  Right  AIMS (if indicated):     Assets:  Housing Leisure Time Physical Health Resilience Social Support Vocational/Educational  ADL's:  Intact  Cognition:  WNL  Sleep:        Treatment Plan Summary: Daily contact with patient to assess and evaluate symptoms and progress in treatment, Medication management and Plan schizoaffective disorder, bipolar type:  -Crisis stabilization -Medication management:  Started gabapentin 200 mg BID for withdrawal from cocaine.  Did not restart home medications as he needed to clear the cocaine and was being discharged. -Individual and substance abuse counseling  Disposition: No evidence of imminent risk to self or others at present.    Waylan Boga, NP 05/07/2016 11:23 AM  Patient seen face-to-face for psychiatric evaluation, chart reviewed and case discussed with the physician extender and  developed treatment plan. Reviewed the information documented and agree with the treatment plan. Corena Pilgrim, MD

## 2016-05-07 NOTE — ED Notes (Signed)
Pt continues to sleep and is snoring but is easily aroused. During assessment pt stated that he is confused as to why he keeps getting himself in situations where he has blackout episodes. Pt stated that he does take psych meds but drinks beer with them. He also states that he uses cocaine when he is stressed. Lives with parents and wife. Has one child that passed away. Pt denies SI/HI/AVH at this time. Educated pt on the dangers of mixing alcohol and his medications. Also suggested drug counseling support systems. Pt verbalized understanding. Contracted for safety. Has not eaten but encouraged fluids and meals.

## 2016-05-07 NOTE — BH Assessment (Signed)
[facility] Assessment Progress Note  Per [name], MD, this pt does not require psychiatric hospitalization at this time.  [facility], and with [facility] or [facility] for outpatient substance abuse treatment.  This has been included in pt's discharge instructions.  Pt's nurse, [name], has been notified.  [name] Canninghomas Joylynn Defrancesco, MA Triage Specialist [phone]

## 2016-05-07 NOTE — Discharge Instructions (Signed)
For your ongoing mental health needs, you are advised to follow up with [facility].  New and returning patients are seen at their walk-in clinic.  Walk-in hours are Monday - Friday from 8:00 am - 3:00 pm.  Walk-in patients are seen on a first come, first served basis.  Try to arrive as early as possible for he best chance of being seen the same day:       [facility]      [address]      [place], [place] [address]      [phone]  To help you maintain a sober lifestyle, a substance abuse treatment program may be beneficial to you.  Contact one of the following providers at your earliest opportunity to ask about enrolling:       [facility]      [address]      [place], [place] [address]      [phone]      New patients are seen at the walk-in clinic every Tuesday from 9:00 am - 12:00 pm.       [facility]      [address]      [place], [place] [address]      [phone]

## 2016-05-07 NOTE — ED Notes (Signed)
Pt escorted by [employer], under IVC presents with complaint of feeling angry towards everyone he states, no one in particular.  Pt admits to history of Schizophrenia. Complaint of hearing voices also. A&O x 3, no distress noted, cooperative at present.  Monitoring for safety, Q 15 min checks in effect.  Pt being eval by TTS counselor Fatima at present.

## 2016-05-07 NOTE — BH Assessment (Signed)
[name]. T[facility] to seek placement.

## 2016-05-07 NOTE — ED Notes (Signed)
[place]

## 2016-05-07 NOTE — ED Notes (Signed)
Attempted to review hx with pt  Pt gave no verbal response to questions  Pt would only shake his head from side to side

## 2016-05-07 NOTE — BH Assessment (Addendum)
Tele Assessment Note   [name] ItoSimon [name] is an 35 y.o. male presenting involuntarily to ED for assessment. IVC was petitioned by pt's sister [name] [name](Hjiem Seth [phone]). Per Petition:  [name] has been diagnosed as schizophrenia, is abusing alcohol, marijuana and cocaine, was committed 2 months ago in [place] he is stating he wants to kill himself and other family members, he is hallucinating, thinking everyone is out to get him, accusing his 34 year old nephew of sleeping with his wife, pulling knives, hammers out on people to attack them and telling he is family he is going to find a gun to attack them.   ---- Pt presents as anxious and depressed with congruent affect. Pt's concentration is decreased and he is not fully oriented. Pt [facility] but, left and when home to attempt to sleep. Pt was then brought to ED by law enforcement.   Pt reports history of schizophrenia. Pt reports visual and auditory hallucinations with command to harm himself and others. Pt reports suicidal ideation and thoughts (plan) to shoot himself. When asked about access to weapons, pts responds "I had some weapons. I bought some". Pt reports recent homicidal ideation however, denies at this interview. Pt denies plan and identified victim. Pt reports jealous delusions ("jealous thoughts,") paranoia, and anger "all the time". Pt reports thinking that others wanted to kill him and he needed a gun to protect himself. Pt reports "always jealous thoughts" such as his wife is cheating on him and their child isn't his.   Pt provided contradictory answers regarding history of substance use. Pt stated that he used drugs. When asked substances of choice pt stated "just regular stuff". Pt also "I think I been doing drugs". Pt later stated he was unsure if he used substances however, reported his longest period of sobriety to be 10 years. Towards end of interview pt requested inpatient admission  stating "I need to go because I might be on some drugs now. I don't know".   Pt reports 0hrs of sleep x 1.5 days  UDS + THC  Diagnosis: F20.9 Schizophrenia F33.2 Major depressive disorder, recurrent severe  Past Medical History:  Past Medical History:  Diagnosis Date  . Anxiety   . Depression   . Mental disorder   . Schizophrenia (HCC)     No past surgical history on file.  Family History:  Family History  Problem Relation Age of Onset  . Mental illness Neg Hx     Social History:  reports that he has been smoking Cigarettes.  He does not have any smokeless tobacco history on file. He reports that he uses drugs, including Marijuana. He reports that he does not drink alcohol.  Additional Social History:  Alcohol / Drug Use Pain Medications: UTA abuse given pt responses Prescriptions: UTA abuse given pt responses Over the Counter: UTA abuse given pt responses History of alcohol / drug use?: Yes Longest period of sobriety (when/how long): 10 years Negative Consequences of Use:  (UTA) Withdrawal Symptoms:  (None endorsed)  CIWA: CIWA-Ar BP: 148/93 Pulse Rate: 94 COWS:    PATIENT STRENGTHS: (choose at least two) Average or above average intelligence Supportive family/friends  Allergies: No Known Allergies  Home Medications:  (Not in a hospital admission)  OB/GYN Status:  No LMP for male patient.  General Assessment Data Location of Assessment: [facility] TTS Assessment: In system Is this a Tele or Face-to-Face Assessment?: Face-to-Face Is this an Initial Assessment or a  Re-assessment for this encounter?: Initial Assessment Is patient pregnant?: No Pregnancy Status: No [name] Arrangements: Parent Can pt return to current [name] arrangement?:  (IVC) Admission Status: Involuntary Is patient capable of signing voluntary admission?: No Referral Source: Self/Family/Friend Insurance type: Medicare     Crisis Care Plan [name] Arrangements: Parent Name of  Psychiatrist: None Name of Therapist: None  Education Status Is patient currently in school?: No Highest grade of school patient has completed: Not Reported  Risk to self with the past 6 months Suicidal Ideation: Yes-Currently Present Has patient been a risk to self within the past 6 months prior to admission? : Yes Suicidal Intent: No Has patient had any suicidal intent within the past 6 months prior to admission? : Yes Is patient at risk for suicide?: Yes Suicidal Plan?: Yes-Currently Present Has patient had any suicidal plan within the past 6 months prior to admission? : Yes Specify Current Suicidal Plan: Shoot Self Access to Means: Yes Specify Access to Suicidal Means: Access to weapons What has been your use of drugs/alcohol within the last 12 months?: Pt chart notes history of drug use Previous Attempts/Gestures: No (Pt denies) Other Self Harm Risks: Paranoia Intentional Self Injurious Behavior: None (Pt denies) Family Suicide History: Unable to assess Recent stressful life event(s): Financial Problems (Persucutory beliefs) Persecutory voices/beliefs?: Yes Depression: Yes Depression Symptoms: Tearfulness, Guilt, Loss of interest in usual pleasures, Feeling angry/irritable, Insomnia Substance abuse history and/or treatment for substance abuse?: Yes Suicide prevention information given to non-admitted patients: Not applicable  Risk to Others within the past 6 months Homicidal Ideation: No-Not Currently/Within Last 6 Months Does patient have any lifetime risk of violence toward others beyond the six months prior to admission? : Yes (comment) Thoughts of Harm to Others: No-Not Currently Present/Within Last 6 Months Current Homicidal Intent: No Current Homicidal Plan: No (Pt denies) Access to Homicidal Means: Yes Describe Access to Homicidal Means: Pt reports access to weapons Identified Victim: None Identified History of harm to others?: Yes Assessment of Violence: On  admission Violent Behavior Description: Pt reports h/o aggression to objects. IVC reports h/o of aggression. Does patient have access to weapons?: Yes (Comment) Criminal Charges Pending?:  [place](uta) Does patient have a court date:  [place](uta) Is patient on probation?: Unknown  Psychosis Hallucinations: Auditory, With command, Visual Delusions: Persecutory, Jealous  Mental Status Report Appearance/Hygiene: In scrubs Eye Contact: Fair Motor Activity: Freedom of movement Speech: Soft Level of Consciousness: Alert Mood: Depressed, Anxious Affect: Appropriate to circumstance Anxiety Level: Moderate Thought Processes: Coherent, Flight of Ideas Judgement: Impaired Orientation: Person, Time, Situation Obsessive Compulsive Thoughts/Behaviors: None  Cognitive Functioning Concentration: Fair Memory: Remote Intact, Recent Impaired IQ: Average Insight: Fair Impulse Control: Fair Appetite: Fair Weight Loss:  (Not Reported) Weight Gain:  (Not Reported) Sleep: Decreased Total Hours of Sleep:  (0hrs x 1.5 days) Vegetative Symptoms: Unable to Assess  ADLScreening [facility] Assessment Services) Patient's cognitive ability adequate to safely complete daily activities?: Yes Patient able to express need for assistance with ADLs?: Yes Independently performs ADLs?: Yes (appropriate for developmental age)  Prior Inpatient Therapy Prior Inpatient Therapy: Yes Prior Therapy Dates: Multiple [facility], Not Reported Reason for Treatment: Schizophrenia, Substance Use  Prior Outpatient Therapy Prior Outpatient Therapy: Yes Prior Therapy Dates: Pt denies current OPT yet, states he last attended on last week Prior Therapy Facilty/Provider(s): [facility] (located off of [address]) Reason for Treatment: Schizophrenia Does patient have an ACCT team?: No Does patient have Intensive In-House Services?  : No Does  patient have [facility] services? : Yes Does patient have P4CC  services?: No  ADL Screening (condition at time of admission) Patient's cognitive ability adequate to safely complete daily activities?: Yes Is the patient deaf or have difficulty hearing?: No Does the patient have difficulty seeing, even when wearing glasses/contacts?: No Does the patient have difficulty concentrating, remembering, or making decisions?: Yes Patient able to express need for assistance with ADLs?: Yes Does the patient have difficulty dressing or bathing?: No Independently performs ADLs?: Yes (appropriate for developmental age) Does the patient have difficulty walking or climbing stairs?: No Weakness of Legs: None Weakness of Arms/Hands: None  Home Assistive Devices/Equipment Home Assistive Devices/Equipment: None  Therapy Consults (therapy consults require a physician order) PT Evaluation Needed: No OT Evalulation Needed: No SLP Evaluation Needed: No Abuse/Neglect Assessment (Assessment to be complete while patient is alone) Physical Abuse: Denies Verbal Abuse: Denies Sexual Abuse: Denies Exploitation of patient/patient's resources: Denies Self-Neglect: Denies Values / Beliefs Cultural Requests During Hospitalization: None Spiritual Requests During Hospitalization: None Consults Spiritual Care Consult Needed: No Social Work Consult Needed: No Merchant navy officer (For Healthcare) Does patient have an advance directive?: No Would patient like information on creating an advanced directive?: No - patient declined information    Additional Information 1:1 In Past 12 Months?: No CIRT Risk: Yes Elopement Risk: No Does patient have medical clearance?: Yes     Disposition: Clinician consulted with Donell Sievert, PA and pt meets criteria for inpatient admission. Pt chart currently under review by Clint Bolder, [facility] for possible [facility] placement. Joanie Coddington, RN informed of pt disposition. Disposition Initial Assessment Completed for this Encounter: Yes Disposition of  Patient: Other dispositions Other disposition(s): Other (Comment) (Pending Psychiatric Recommendation)  Aylah Yeary J [place] 05/07/2016 4:04 AM

## 2016-05-07 NOTE — [facility] Suicide Risk Assessment (Signed)
Suicide Risk Assessment  Discharge Assessment   [facility] Discharge Suicide Risk Assessment   Principal Problem: Schizoaffective disorder, bipolar type [facility](HCC) Discharge Diagnoses:  Patient Active Problem List   Diagnosis Date Noted  . Schizoaffective disorder, bipolar type (HCC) [F25.0] 05/07/2016    Priority: High  . Cocaine abuse [F14.10] 05/07/2016    Priority: High  . Cannabis use disorder, moderate, in sustained remission (HCC) [F12.21] 04/03/2015    Priority: High  . Alcohol use disorder, moderate, in sustained remission (HCC) [F10.21] 04/03/2015    Priority: High    Total Time spent with patient: 45 minutes  Musculoskeletal: Strength & Muscle Tone: within normal limits Gait & Station: normal Patient leans: N/A  Psychiatric Specialty Exam: Physical Exam  Constitutional: He is oriented to person, place, and time. He appears well-developed and well-nourished.  HENT:  Head: Normocephalic.  Neck: Normal range of motion.  Respiratory: Effort normal.  Musculoskeletal: Normal range of motion.  Neurological: He is alert and oriented to person, place, and time.  Skin: Skin is warm and dry.  Psychiatric: He has a normal mood and affect. His speech is normal and behavior is normal. Judgment and thought content normal. Cognition and memory are normal.    Review of Systems  Constitutional: Negative.   HENT: Negative.   Eyes: Negative.   Respiratory: Negative.   Cardiovascular: Negative.   Gastrointestinal: Negative.   Genitourinary: Negative.   Musculoskeletal: Negative.   Skin: Negative.   Neurological: Negative.   Endo/Heme/Allergies: Negative.   Psychiatric/Behavioral: Positive for substance abuse.    Blood pressure 148/93, pulse 94, temperature 98.8 F (37.1 C), temperature source Oral, resp. rate 18, SpO2 100 %.There is no height or weight on file to calculate BMI.  General Appearance: Casual  Eye Contact:  Good  Speech:  Normal Rate  Volume:  Normal  Mood:  Euthymic   Affect:  Blunt  Thought Process:  Coherent and Descriptions of Associations: Intact  Orientation:  Full (Time, Place, and Person)  Thought Content:  WDL  Suicidal Thoughts:  No  Homicidal Thoughts:  No  Memory:  Immediate;   Good Recent;   Good Remote;   Good  Judgement:  Fair  Insight:  Fair  Psychomotor Activity:  Normal  Concentration:  Concentration: Good and Attention Span: Good  Recall:  Good  Fund of Knowledge:  Fair  Language:  Good  Akathisia:  No  Handed:  Right  AIMS (if indicated):     Assets:  Housing Leisure Time Physical Health Resilience Social Support Vocational/Educational  ADL's:  Intact  Cognition:  WNL  Sleep:       Mental Status Per Nursing Assessment::   On Admission:   Suicidal/homicidal ideations with cocaine abuse  Demographic Factors:  Male  Loss Factors: NA  Historical Factors: NA  Risk Reduction Factors:   Sense of responsibility to family, Employed, Living with another person, especially a relative, Positive social support and Positive therapeutic relationship  Continued Clinical Symptoms:  None  Cognitive Features That Contribute To Risk:  None    Suicide Risk:  Minimal: No identifiable suicidal ideation.  Patients presenting with no risk factors but with morbid ruminations; may be classified as minimal risk based on the severity of the depressive symptoms    Plan Of Care/Follow-up recommendations:  Activity:  as tolerated Diet:  heart healthy diet  LORD, JAMISON, NP 05/07/2016, 5:02 PM

## 2016-05-07 NOTE — ED Triage Notes (Signed)
Pt brought in by [employer] under IVC  Paperwork states Pt has been diagnosed with schizophrenia, is abusing alcohol, marijuana, and cocaine  [place]  Pt is stating he wants to kill himself and other family members and threatening them

## 2016-05-07 NOTE — ED Provider Notes (Signed)
[facility]-EMERGENCY DEPT Provider Note   CSN: [id number] Arrival date & time: 05/07/16  0057 By signing my name below, I, [name], attest that this documentation has been prepared under the direction and in the presence of [name] Havens, MD. Electronically Signed: [name], ED Scribe. 05/07/16. 1:25 AM.   History   Chief Complaint Chief Complaint  Patient presents with  . Suicidal  . Homicidal  . IVC   HPI Comments: [name] [name] is a 35 y.o. male who presents to the Emergency Department via [employer] with a PMHx of schizophrenia complaining of suicidal and homicidal ideations. As per medical records pt has been stating that he wants to kill himself and others with a knife or a gun. Pt also reports auditory hallucinations. Medical records report that pt has been abusing alcohol, marijuana and cocaine and was committed 2 months ago [place]. No other complaints at this time.   The history is provided by the police, medical records and the patient (psychosis and uncooperativeness). No language interpreter was used.  Mental Health Problem  Presenting symptoms: hallucinations, homicidal ideas and suicidal thoughts   Presenting symptoms: no self-mutilation   Patient accompanied by:  Law enforcement Degree of incapacity (severity):  Mild Onset quality:  Unable to specify Timing:  Constant Progression:  Worsening Chronicity:  Recurrent Context: alcohol use and drug abuse   Treatment compliance:  Untreated Time since last dose of psychoactive medication: unknown. Relieved by:  Nothing Worsened by:  Nothing Ineffective treatments:  None tried Associated symptoms: no abdominal pain and no euphoric mood   Risk factors: hx of mental illness     Past Medical History:  Diagnosis Date  . Anxiety   . Depression   . Mental disorder   . Schizophrenia [facility]HCC)     Patient Active Problem List   Diagnosis Date Noted  . Undifferentiated schizophrenia (HCC) 04/03/2015  . Cannabis use  disorder, moderate, in sustained remission (HCC) 04/03/2015  . Alcohol use disorder, moderate, in sustained remission (HCC) 04/03/2015    No past surgical history on file.    Home Medications    Prior to Admission medications   Medication Sig Start Date End Date Taking? Authorizing Provider  benztropine (COGENTIN) 0.5 MG tablet Take 1 tablet (0.5 mg total) by mouth at bedtime. Patient not taking: Reported on 02/22/2016 04/08/15   [name], FNP  cetirizine (ZYRTEC ALLERGY) 10 MG tablet Take 1 tablet (10 mg total) by mouth daily. Patient not taking: Reported on 02/22/2016 11/24/15   [name], MD  citalopram (CELEXA) 20 MG tablet Take 1 tablet (20 mg total) by mouth daily. Patient not taking: Reported on 02/22/2016 04/08/15   [name], FNP  fluticasone (FLONASE) 50 MCG/ACT nasal spray Place 2 sprays into both nostrils daily. 11/24/15   [name], MD  hydrOXYzine (ATARAX/VISTARIL) 25 MG tablet Take 1 tablet (25 mg total) by mouth 3 (three) times daily as needed for anxiety. Patient not taking: Reported on 02/22/2016 04/08/15   [name], FNP  mirtazapine (REMERON) 15 MG tablet Take 1 tablet by mouth at bedtime. 02/17/16   Historical Provider, MD  Multiple Vitamins-Minerals (MULTI ADULT GUMMIES PO) Take 2 tablets by mouth daily.    Historical Provider, MD  traZODone (DESYREL) 100 MG tablet Take 1 tablet (100 mg total) by mouth at bedtime. Patient not taking: Reported on 02/22/2016 04/08/15   [name], FNP  ziprasidone (GEODON) 80 MG capsule Take 1 capsule by mouth 2 (two) times daily.  02/17/16   Historical Provider, MD    Family History Family History  Problem Relation Age of Onset  . Mental illness Neg Hx     Social History Social History  Substance Use Topics  . Smoking status: Current Every Day Smoker    Types: Cigarettes  . Smokeless tobacco: Not on file  . Alcohol use No     Allergies   Review of patient's allergies indicates no known  allergies.   Review of Systems Review of Systems  Unable to perform ROS: Psychiatric disorder  Gastrointestinal: Negative for abdominal pain.  Psychiatric/Behavioral: Positive for dysphoric mood, hallucinations, homicidal ideas and suicidal ideas. Negative for self-injury.    Physical Exam Updated Vital Signs BP 152/92 (BP Location: Left Arm)   Pulse 110   Temp 99.4 F (37.4 C) (Oral)   Resp 18   SpO2 100%   Physical Exam  Constitutional: He is oriented to person, place, and time. He appears well-developed and well-nourished. No distress.  HENT:  Head: Normocephalic and atraumatic.  Mouth/Throat: Oropharynx is clear and moist. No oropharyngeal exudate.  Moist mucous membranes   Eyes: Conjunctivae are normal. Pupils are equal, round, and reactive to light.  Neck: No JVD present.  Trachea midline No bruit  Cardiovascular: Normal rate, regular rhythm and normal heart sounds.   Pulmonary/Chest: Effort normal and breath sounds normal. No stridor. No respiratory distress.  Abdominal: Soft. Bowel sounds are normal. He exhibits no distension.  Neurological: He is alert and oriented to person, place, and time. He has normal reflexes.  Skin: Skin is warm and dry. Capillary refill takes less than 2 seconds.  Psychiatric: His affect is blunt. His speech is delayed. His speech is not rapid and/or pressured. He is agitated. He expresses homicidal and suicidal ideation. He expresses homicidal plans. He exhibits normal recent memory.  Nursing note and vitals reviewed.  ED Treatments / Results   Vitals:   05/07/16 0101  BP: 152/92  Pulse: 110  Resp: 18  Temp: 99.4 F (37.4 C)   Results for orders placed or performed during the hospital encounter of 05/07/16  Comprehensive metabolic panel  Result Value Ref Range   Sodium 135 135 - 145 mmol/L   Potassium 4.0 3.5 - 5.1 mmol/L   Chloride 103 101 - 111 mmol/L   CO2 26 22 - 32 mmol/L   Glucose, Bld 119 (H) 65 - 99 mg/dL   BUN 11 6 -  20 mg/dL   Creatinine, Ser 9.60 0.61 - 1.24 mg/dL   Calcium 9.5 8.9 - 45.4 mg/dL   Total Protein 8.8 (H) 6.5 - 8.1 g/dL   Albumin 5.0 3.5 - 5.0 g/dL   AST 30 15 - 41 U/L   ALT 49 17 - 63 U/L   Alkaline Phosphatase 54 38 - 126 U/L   Total Bilirubin 0.6 0.3 - 1.2 mg/dL   GFR calc non Af Amer >60 >60 mL/min   GFR calc Af Amer >60 >60 mL/min   Anion gap 6 5 - 15  Ethanol  Result Value Ref Range   Alcohol, Ethyl (B) <5 <5 mg/dL  Salicylate level  Result Value Ref Range   Salicylate Lvl <7.0 2.8 - 30.0 mg/dL  Acetaminophen level  Result Value Ref Range   Acetaminophen (Tylenol), Serum <10 (L) 10 - 30 ug/mL  cbc  Result Value Ref Range   WBC 10.1 4.0 - 10.5 K/uL   RBC 5.64 4.22 - 5.81 MIL/uL   Hemoglobin 15.2 13.0 - 17.0 g/dL  HCT 45.2 39.0 - 52.0 %   MCV 80.1 78.0 - 100.0 fL   MCH 27.0 26.0 - 34.0 pg   MCHC 33.6 30.0 - 36.0 g/dL   RDW 40.913.4 81.111.5 - 91.415.5 %   Platelets 290 150 - 400 K/uL  Rapid urine drug screen (hospital performed)  Result Value Ref Range   Opiates NONE DETECTED NONE DETECTED   Cocaine POSITIVE (A) NONE DETECTED   Benzodiazepines NONE DETECTED NONE DETECTED   Amphetamines NONE DETECTED NONE DETECTED   Tetrahydrocannabinol NONE DETECTED NONE DETECTED   Barbiturates NONE DETECTED NONE DETECTED   No results found. Medications - No data to display  Procedures Procedures (including critical care time)    EKG Interpretation  Date/Time:  Thursday May 07 2016 01:36:57 EDT Ventricular Rate:  105 PR Interval:    QRS Duration: 98 QT Interval:  359 QTC Calculation: 475 R Axis:   93 Text Interpretation:  Sinus tachycardia Borderline right axis deviation Confirmed by [facility]  MD, Louine Tenpenny ([id number]) on 05/07/2016 2:19:57 AM        Final Clinical Impressions(s) / ED Diagnoses  Under IVC for being homicidal towards family is medically clear but will need inpatient psychiatric admission   I personally performed the services described in this  documentation, which was scribed in my presence. The recorded information has been reviewed and is accurate.     Cy BlamerApril Jowell Bossi, MD 05/07/16 [phone]

## 2016-05-07 NOTE — ED Notes (Signed)
Pt ready for dc, follow up instructions provided, IVC has been rescinded per [name] CSW

## 2016-05-07 NOTE — ED Notes (Signed)
Pt has in belonging bag:  White button up shirt, blue jeans, black belt, white shoes, black socks, silver colored watch (placed in shoes), blue basketball shorts.

## 2016-07-13 DIAGNOSIS — F209 Schizophrenia, unspecified: Secondary | ICD-10-CM | POA: Diagnosis not present

## 2016-07-20 DIAGNOSIS — Z720 Tobacco use: Secondary | ICD-10-CM | POA: Diagnosis not present

## 2016-07-20 DIAGNOSIS — F17200 Nicotine dependence, unspecified, uncomplicated: Secondary | ICD-10-CM | POA: Diagnosis not present

## 2016-07-20 DIAGNOSIS — Z716 Tobacco abuse counseling: Secondary | ICD-10-CM | POA: Diagnosis not present

## 2016-09-16 ENCOUNTER — Encounter (HOSPITAL_COMMUNITY): Payer: Self-pay | Admitting: Family Medicine

## 2016-09-16 ENCOUNTER — Ambulatory Visit (HOSPITAL_COMMUNITY)
Admission: RE | Admit: 2016-09-16 | Discharge: 2016-09-16 | Disposition: A | Discharge status: Home / Self Care | Payer: Medicare Other | Attending: Psychiatry | Admitting: Psychiatry

## 2016-09-16 DIAGNOSIS — F322 Major depressive disorder, single episode, severe without psychotic features: Secondary | ICD-10-CM | POA: Insufficient documentation

## 2016-09-16 DIAGNOSIS — F1290 Cannabis use, unspecified, uncomplicated: Secondary | ICD-10-CM | POA: Diagnosis not present

## 2016-09-16 DIAGNOSIS — F17210 Nicotine dependence, cigarettes, uncomplicated: Secondary | ICD-10-CM | POA: Diagnosis not present

## 2016-09-16 DIAGNOSIS — Z79899 Other long term (current) drug therapy: Secondary | ICD-10-CM | POA: Diagnosis not present

## 2016-09-16 DIAGNOSIS — F250 Schizoaffective disorder, bipolar type: Secondary | ICD-10-CM | POA: Insufficient documentation

## 2016-09-16 DIAGNOSIS — F918 Other conduct disorders: Secondary | ICD-10-CM | POA: Diagnosis present

## 2016-09-16 DIAGNOSIS — F320 Major depressive disorder, single episode, mild: Secondary | ICD-10-CM | POA: Diagnosis not present

## 2016-09-16 DIAGNOSIS — F1590 Other stimulant use, unspecified, uncomplicated: Secondary | ICD-10-CM | POA: Diagnosis not present

## 2016-09-16 NOTE — ED Triage Notes (Signed)
[facility]. [facility], patient has been using drugs, has extreme bi-polar moments. Pt reports he becomes SI when gets in a rage. Denies HI and SI at this time. Currently, patient is calm and cooperative.

## 2016-09-16 NOTE — BH Assessment (Signed)
Tele Assessment Note   [name] [name] is an 36 y.o. male, Hispanic, Married, who presents to [facility] as a walk-in. Patient c/o Roberts Gaudy., dangerous behaviors, bipolar, depression, and AH that are getting worse. Patient wife and mother was present during assessment. Per family, pt. Has tendencies with bipolar/ schizophrenia like behaviors and has addiction which goes one extreme to other, spending money, gamble, drug use, and bouts of depression/ fits of anger. Patient states that he wants to seek help and was concerned because started having SI thoughts and AH had gotten worse. Patient resides with wife and mother,and has loss of sleep with as little as x 2 hours per night.  Patient acknowledges current SI no plan specified. Patient denies current HI. Patient acknowledges AH. Patient acknowledges S.A. With last use x 2 weeks ago for multiple substances [alcohol, meth, cannabis, cocaine]. Patient has been see inpatient last in 2017 at [facility] for similar issues. Patient is seen outpatient for med management at [facility], but states no therapy for mental helath or S.A.   Patient is dressed in normal attire, and is alert and oriented x4. Patient speech was within normal limits and motor behavior appeared normal. Patient thought process is coherent, but at times circular in nature with flight of ideas. Patient  does not appear to be responding to internal stimuli. Patient was cooperative throughout the assessment and states that he is agreeable to inpatient psychiatric treatment.   Diagnosis: Substance Induces Psychosis;  Bipolar Disorder  Past Medical History:  Past Medical History:  Diagnosis Date  . Anxiety   . Depression   . Mental disorder   . Schizophrenia (HCC)     No past surgical history on file.  Family History:  Family History  Problem Relation Age of Onset  . Mental illness Neg Hx     Social History:  reports that he has been smoking Cigarettes.  He does not have any smokeless tobacco  history on file. He reports that he uses drugs, including Marijuana. He reports that he does not drink alcohol.  Additional Social History:  Alcohol / Drug Use Pain Medications: SEE MAR Prescriptions: SEE MAR Over the Counter: SEE MAR History of alcohol / drug use?: Yes Longest period of sobriety (when/how long): none Negative Consequences of Use: Financial, Legal, Personal relationships, Work / School Withdrawal Symptoms: Patient aware of relationship between substance abuse and physical/medical complications Substance #1 Name of Substance 1: alcohol 1 - Age of First Use: unknown 1 - Amount (size/oz): varies 1 - Frequency: random 1 - Duration: years 1 - Last Use / Amount: 09/16/16 unspecified amount Substance #2 Name of Substance 2: meth 2 - Age of First Use: unspecified 2 - Amount (size/oz): unknown 2 - Frequency: random 2 - Duration: years 2 - Last Use / Amount: x 2 weeks ago unknown amount  CIWA:   COWS:    PATIENT STRENGTHS: (choose at least two) Ability for insight Active sense of humor Average or above average intelligence Capable of independent living  Allergies: No Known Allergies  Home Medications:  (Not in a hospital admission)  OB/GYN Status:  No LMP for male patient.  General Assessment Data Location of Assessment: [facility] Assessment Services TTS Assessment: In system Is this a Tele or Face-to-Face Assessment?: Face-to-Face Is this an Initial Assessment or a Re-assessment for this encounter?: Initial Assessment Marital status: Married [place] name: n/a Is patient pregnant?: No Pregnancy Status: No Living Arrangements: Spouse/significant other Can pt return to current living arrangement?: Yes Admission Status: Voluntary  Is patient capable of signing voluntary admission?: Yes Referral Source: Self/Family/Friend Insurance type: Medicare  Medical Screening Exam [facility] Walk-in ONLY) Medical Exam completed: Yes  Crisis Care Plan Living Arrangements:  Spouse/significant other Name of Psychiatrist: [facility] Name of Therapist: none  Education Status Is patient currently in school?: No Current Grade: n/a Highest grade of school patient has completed: GED Name of school: n/a Contact person: wife  Risk to self with the past 6 months Suicidal Ideation: Yes-Currently Present Has patient been a risk to self within the past 6 months prior to admission? : Yes Suicidal Intent: No Has patient had any suicidal intent within the past 6 months prior to admission? : No Is patient at risk for suicide?: Yes Suicidal Plan?: No Has patient had any suicidal plan within the past 6 months prior to admission? : No Access to Means: No What has been your use of drugs/alcohol within the last 12 months?: polysubstance abuse Previous Attempts/Gestures: No How many times?: 0 Other Self Harm Risks: drug use Triggers for Past Attempts: Unpredictable Intentional Self Injurious Behavior: None Family Suicide History: No Recent stressful life event(s): Turmoil (Comment) Persecutory voices/beliefs?: Yes Depression: Yes Depression Symptoms: Tearfulness, Isolating, Fatigue, Guilt, Loss of interest in usual pleasures, Feeling worthless/self pity Substance abuse history and/or treatment for substance abuse?: Yes Suicide prevention information given to non-admitted patients: Yes  Risk to Others within the past 6 months Homicidal Ideation: No Does patient have any lifetime risk of violence toward others beyond the six months prior to admission? : No Thoughts of Harm to Others: No Current Homicidal Intent: No Current Homicidal Plan: No Access to Homicidal Means: No Identified Victim: none History of harm to others?: No Assessment of Violence: In past 6-12 months Violent Behavior Description: when angry destroy property, no recent Does patient have access to weapons?: No Criminal Charges Pending?: No Does patient have a court date: Yes Court Date: 10/05/16  (assault/ property damage) Is patient on probation?: No  Psychosis Hallucinations: Auditory Delusions: None noted  Mental Status Report Appearance/Hygiene: Unremarkable Eye Contact: Good Motor Activity: Restlessness Speech: Logical/coherent Level of Consciousness: Alert Mood: Depressed Affect: Depressed Anxiety Level: Moderate Thought Processes: Relevant Judgement: Impaired Orientation: Person, Place, Time, Situation, Appropriate for developmental age Obsessive Compulsive Thoughts/Behaviors: Moderate  Cognitive Functioning Concentration: Decreased Memory: Recent Intact, Remote Intact IQ: Average Insight: Poor Impulse Control: Poor Appetite: Poor Weight Loss: 0 Weight Gain: 0 Sleep: Decreased Total Hours of Sleep: 2 Vegetative Symptoms: None  ADLScreening [facility] Assessment Services) Patient's cognitive ability adequate to safely complete daily activities?: Yes Patient able to express need for assistance with ADLs?: Yes Independently performs ADLs?: Yes (appropriate for developmental age)  Prior Inpatient Therapy Prior Inpatient Therapy: Yes Prior Therapy Dates: 2017 Prior Therapy Facilty/Provider(s): [facility] Reason for Treatment: [facility]. psychosis  Prior Outpatient Therapy Prior Outpatient Therapy: Yes Prior Therapy Dates: current Prior Therapy Facilty/Provider(s): [facility] Reason for Treatment: med mngmt. Does patient have an ACCT team?: No Does patient have Intensive In-House Services?  : No Does patient have [facility] services? : Yes Does patient have P4CC services?: No  ADL Screening (condition at time of admission) Patient's cognitive ability adequate to safely complete daily activities?: Yes Is the patient deaf or have difficulty hearing?: No Does the patient have difficulty seeing, even when wearing glasses/contacts?: No Does the patient have difficulty concentrating, remembering, or making decisions?: No Patient able to express need for assistance with  ADLs?: Yes Does the patient have difficulty dressing or bathing?: No Independently performs ADLs?: Yes (  appropriate for developmental age) Does the patient have difficulty walking or climbing stairs?: No Weakness of Legs: None Weakness of Arms/Hands: None       Abuse/Neglect Assessment (Assessment to be complete while patient is alone) Physical Abuse: Denies Verbal Abuse: Denies Sexual Abuse: Denies Exploitation of patient/patient's resources: Denies Values / Beliefs Cultural Requests During Hospitalization: None Spiritual Requests During Hospitalization: None Consults Spiritual Care Consult Needed: Yes (Comment) Advance Directives (For Healthcare) Does Patient Have a Medical Advance Directive?: No    Additional Information 1:1 In Past 12 Months?: No CIRT Risk: No Elopement Risk: No Does patient have medical clearance?: No (transport [facility])     Disposition: Per Donell SievertSpencer Ahmet, PA meets inpatient criteria. Will need medical clearance. Disposition Initial Assessment Completed for this Encounter: Yes Disposition of Patient: Inpatient treatment program Type of inpatient treatment program: Adult  Hipolito BayleyShean K Elray Dains 09/16/2016 10:08 PM

## 2016-09-16 NOTE — H&P (Signed)
Behavioral Health Medical Screening Exam  [name] ItoSimon [name] is an 36 y.o. male, presenting with his family seeking psychiatric evaluation due to worsening illicit drug use, paranoia and mood volatility. He is also endorsing depression with SI/plan. He is denying significant medical co-morbidities.  Total Time spent with patient: 20 minutes  Psychiatric Specialty Exam: Physical Exam  Constitutional: He is oriented to person, place, and time. He appears well-developed and well-nourished. No distress.  HENT:  Head: Normocephalic.  Eyes: Pupils are equal, round, and reactive to light.  Respiratory: Effort normal and breath sounds normal. No respiratory distress.  Neurological: He is alert and oriented to person, place, and time. No cranial nerve deficit.  Skin: Skin is warm and dry. He is not diaphoretic.  Psychiatric: His speech is normal. His mood appears anxious. His affect is angry. He is agitated, aggressive and combative. Thought content is paranoid. Cognition and memory are impaired. He expresses impulsivity. He exhibits a depressed mood. He expresses suicidal ideation.    Review of Systems  Psychiatric/Behavioral: Positive for depression, substance abuse and suicidal ideas. The patient is nervous/anxious.   All other systems reviewed and are negative.   There were no vitals taken for this visit.There is no height or weight on file to calculate BMI.  General Appearance: Disheveled  Eye Contact:  Good  Speech:  Clear and Coherent  Volume:  Normal  Mood:  Depressed and Irritable  Affect:  Full Range  Thought Process:  Goal Directed  Orientation:  Full (Time, Place, and Person)  Thought Content:  Paranoid Ideation  Suicidal Thoughts:  Yes.  with intent/plan  Homicidal Thoughts:  No  Memory:  Immediate;   Good  Judgement:  Impaired  Insight:  Lacking  Psychomotor Activity:  Negative  Concentration: Concentration: Fair  Recall:  Fair  Fund of Knowledge:Fair  Language: Negative   Akathisia:  Negative  Handed:  Right  AIMS (if indicated):     Assets:  Desire for Improvement  Sleep:       Musculoskeletal: Strength & Muscle Tone: within normal limits Gait & Station: normal Patient leans: N/A  There were no vitals taken for this visit.  Recommendations:  Based on my evaluation the patient appears to have an emergency medical condition for which I recommend the patient be transferred to the emergency department for further evaluation.  Tabor,Rajni Holsworth E, PA-C 09/16/2016, 9:48 PM

## 2016-09-17 ENCOUNTER — Emergency Department (HOSPITAL_COMMUNITY)
Admission: EM | Admit: 2016-09-17 | Discharge: 2016-09-17 | Disposition: A | Discharge status: Home / Self Care | Payer: Medicare Other | Attending: Psychiatry | Admitting: Psychiatry

## 2016-09-17 ENCOUNTER — Encounter (HOSPITAL_COMMUNITY): Payer: Self-pay | Admitting: Emergency Medicine

## 2016-09-17 DIAGNOSIS — F250 Schizoaffective disorder, bipolar type: Secondary | ICD-10-CM | POA: Diagnosis present

## 2016-09-17 DIAGNOSIS — F17210 Nicotine dependence, cigarettes, uncomplicated: Secondary | ICD-10-CM | POA: Diagnosis not present

## 2016-09-17 DIAGNOSIS — F1590 Other stimulant use, unspecified, uncomplicated: Secondary | ICD-10-CM

## 2016-09-17 DIAGNOSIS — Z79899 Other long term (current) drug therapy: Secondary | ICD-10-CM

## 2016-09-17 DIAGNOSIS — F1290 Cannabis use, unspecified, uncomplicated: Secondary | ICD-10-CM | POA: Diagnosis not present

## 2016-09-17 DIAGNOSIS — F320 Major depressive disorder, single episode, mild: Secondary | ICD-10-CM

## 2016-09-17 LAB — COMPREHENSIVE METABOLIC PANEL
ALBUMIN: 4.3 g/dL (ref 3.5–5.0)
ALT: 60 U/L (ref 17–63)
ANION GAP: 9 (ref 5–15)
AST: 40 U/L (ref 15–41)
Alkaline Phosphatase: 48 U/L (ref 38–126)
BILIRUBIN TOTAL: 0.4 mg/dL (ref 0.3–1.2)
BUN: 9 mg/dL (ref 6–20)
CHLORIDE: 106 mmol/L (ref 101–111)
CO2: 21 mmol/L — ABNORMAL LOW (ref 22–32)
Calcium: 9.1 mg/dL (ref 8.9–10.3)
Creatinine, Ser: 0.93 mg/dL (ref 0.61–1.24)
GFR calc Af Amer: >60 mL/min (ref >60)
GFR calc non Af Amer: >60 mL/min (ref >60)
GLUCOSE: 159 mg/dL — AB (ref 65–99)
POTASSIUM: 3.7 mmol/L (ref 3.5–5.1)
Sodium: 136 mmol/L (ref 135–145)
TOTAL PROTEIN: 7.6 g/dL (ref 6.5–8.1)

## 2016-09-17 LAB — CBC
HEMATOCRIT: 44.6 % (ref 39.0–52.0)
Hemoglobin: 14.6 g/dL (ref 13.0–17.0)
MCH: 26.4 pg (ref 26.0–34.0)
MCHC: 32.7 g/dL (ref 30.0–36.0)
MCV: 80.7 fL (ref 78.0–100.0)
Platelets: 224 10*3/uL (ref 150–400)
RBC: 5.53 MIL/uL (ref 4.22–5.81)
RDW: 13.7 % (ref 11.5–15.5)
WBC: 6.5 10*3/uL (ref 4.0–10.5)

## 2016-09-17 LAB — RAPID URINE DRUG SCREEN, HOSP PERFORMED
AMPHETAMINES: NOT DETECTED
BENZODIAZEPINES: NOT DETECTED
Barbiturates: NOT DETECTED
COCAINE: NOT DETECTED
OPIATES: NOT DETECTED
Tetrahydrocannabinol: NOT DETECTED

## 2016-09-17 LAB — ETHANOL: Alcohol, Ethyl (B): <5 mg/dL (ref <5)

## 2016-09-17 LAB — SALICYLATE LEVEL: Salicylate Lvl: <7 mg/dL (ref 2.8–30.0)

## 2016-09-17 LAB — ACETAMINOPHEN LEVEL

## 2016-09-17 MED ORDER — ZOLPIDEM TARTRATE 5 MG PO TABS: 5.0000 mg | ORAL_TABLET | Freq: Every evening | ORAL | Status: DC | PRN

## 2016-09-17 MED ORDER — ALUM & MAG HYDROXIDE-SIMETH 200-200-20 MG/5ML PO SUSP: 30.0000 mL | ORAL | Status: DC | PRN

## 2016-09-17 MED ORDER — ONDANSETRON HCL 4 MG PO TABS: 4.0000 mg | ORAL_TABLET | Freq: Three times a day (TID) | ORAL | Status: DC | PRN

## 2016-09-17 MED ORDER — LORAZEPAM 1 MG PO TABS: 1.0000 mg | ORAL_TABLET | Freq: Three times a day (TID) | ORAL | Status: DC | PRN

## 2016-09-17 MED ORDER — ACETAMINOPHEN 325 MG PO TABS: 650.0000 mg | ORAL_TABLET | ORAL | Status: DC | PRN

## 2016-09-17 NOTE — BH Assessment (Signed)
[facility] Assessment Progress Note  Per Mojeed Akintayo, MD, this pt does not require psychiatric hospitalization at this time.  Pt is to be discharged from [facility] with recommendation to follow up with the [facility].  This has been included in pt's discharge instructions.  Pt's nurse, Edie, has been notified.  [name] Colpitts, MA Triage Specialist [phone]     

## 2016-09-17 NOTE — Consult Note (Signed)
[facility] Psychiatry Consult   Reason for Consult:  Suicidal ideations Referring Physician:  EDP Patient Identification: [name] [name] MRN:  [id number] Principal Diagnosis: Schizoaffective disorder, bipolar type [facility] Diagnosis:   Patient Active Problem List   Diagnosis Date Noted  . Schizoaffective disorder, bipolar type ([place]) [F25.0] 05/07/2016    Priority: High  . Cocaine abuse [F14.10] 05/07/2016    Priority: High  . Cannabis use disorder, moderate, in sustained remission ([place]) [F12.21] 04/03/2015    Priority: High  . Alcohol use disorder, moderate, in sustained remission ([place]) [F10.21] 04/03/2015    Priority: High    Total Time spent with patient: 45 minutes  Subjective:   [name] [name] is a 36 y.o. male patient does not warrant admission.  HPI:  36 yo male who came to the ED after having issues with his dad and wanting to get away from cigarettes and drugs.  Today, he denies ever saying he was suicidal, no homicidal ideations, hallucinations, or withdrawal symptoms.  He wants to get assistance with his addictive behaviors like porn, smoking, drugs, etc.  Stable for discharge with outpatient resources.  Receives medications via [facility]  Past Psychiatric History: schizoaffective disorder, substance abuse  Risk to Self: Is patient at risk for suicide?: No Risk to Others:  No Prior Inpatient Therapy:  [facility] Prior Outpatient Therapy:  yes   Past Medical History:  Past Medical History:  Diagnosis Date  . Anxiety   . Depression   . Mental disorder   . Schizophrenia ([place])    History reviewed. No pertinent surgical history. Family History:  Family History  Problem Relation Age of Onset  . Mental illness Neg Hx    Family Psychiatric  History: none Social History:  History  Alcohol Use  . Yes    Comment: Once a week. "When I am around my friends"      History  Drug Use  . Types: Marijuana, Methamphetamines    Comment: Last used: 2 weeks ago    Social History    Social History  . Marital status: Single    Spouse name: N/A  . Number of children: N/A  . Years of education: N/A   Social History Main Topics  . Smoking status: Current Every Day Smoker    Packs/day: 1.00    Types: Cigarettes  . Smokeless tobacco: Former Systems developer  . Alcohol use Yes     Comment: Once a week. "When I am around my friends"   . Drug use: Yes    Types: Marijuana, Methamphetamines     Comment: Last used: 2 weeks ago  . Sexual activity: Not Asked   Other Topics Concern  . None   Social History Narrative  . None   Additional Social History:    Allergies:  No Known Allergies  Labs:  Results for orders placed or performed during the hospital encounter of 09/17/16 (from the past 48 hour(s))  Comprehensive metabolic panel     Status: Abnormal   Collection Time: 09/16/16 11:58 PM  Result Value Ref Range   Sodium 136 135 - 145 mmol/L   Potassium 3.7 3.5 - 5.1 mmol/L   Chloride 106 101 - 111 mmol/L   CO2 21 (L) 22 - 32 mmol/L   Glucose, Bld 159 (H) 65 - 99 mg/dL   BUN 9 6 - 20 mg/dL   Creatinine, Ser 0.93 0.61 - 1.24 mg/dL   Calcium 9.1 8.9 - 10.3 mg/dL   Total Protein 7.6 6.5 - 8.1 g/dL   Albumin  4.3 3.5 - 5.0 g/dL   AST 40 15 - 41 U/L   ALT 60 17 - 63 U/L   Alkaline Phosphatase 48 38 - 126 U/L   Total Bilirubin 0.4 0.3 - 1.2 mg/dL   GFR calc non Af Amer >60 >60 mL/min   GFR calc Af Amer >60 >60 mL/min    Comment: (NOTE) The eGFR has been calculated using the CKD EPI equation. This calculation has not been validated in all clinical situations. eGFR's persistently <60 mL/min signify possible Chronic Kidney Disease.    Anion gap 9 5 - 15  Ethanol     Status: None   Collection Time: 09/16/16 11:58 PM  Result Value Ref Range   Alcohol, Ethyl (B) <5 <5 mg/dL    Comment:        LOWEST DETECTABLE LIMIT FOR SERUM ALCOHOL IS 5 mg/dL FOR MEDICAL PURPOSES ONLY   Salicylate level     Status: None   Collection Time: 09/16/16 11:58 PM  Result Value Ref Range    Salicylate Lvl <9.6 2.8 - 30.0 mg/dL  Acetaminophen level     Status: Abnormal   Collection Time: 09/16/16 11:58 PM  Result Value Ref Range   Acetaminophen (Tylenol), Serum <10 (L) 10 - 30 ug/mL    Comment:        THERAPEUTIC CONCENTRATIONS VARY SIGNIFICANTLY. A RANGE OF 10-30 ug/mL MAY BE AN EFFECTIVE CONCENTRATION FOR MANY PATIENTS. HOWEVER, SOME ARE BEST TREATED AT CONCENTRATIONS OUTSIDE THIS RANGE. ACETAMINOPHEN CONCENTRATIONS >150 ug/mL AT 4 HOURS AFTER INGESTION AND >50 ug/mL AT 12 HOURS AFTER INGESTION ARE OFTEN ASSOCIATED WITH TOXIC REACTIONS.   cbc     Status: None   Collection Time: 09/16/16 11:58 PM  Result Value Ref Range   WBC 6.5 4.0 - 10.5 K/uL   RBC 5.53 4.22 - 5.81 MIL/uL   Hemoglobin 14.6 13.0 - 17.0 g/dL   HCT 44.6 39.0 - 52.0 %   MCV 80.7 78.0 - 100.0 fL   MCH 26.4 26.0 - 34.0 pg   MCHC 32.7 30.0 - 36.0 g/dL   RDW 13.7 11.5 - 15.5 %   Platelets 224 150 - 400 K/uL  Rapid urine drug screen (hospital performed)     Status: None   Collection Time: 09/17/16  1:25 AM  Result Value Ref Range   Opiates NONE DETECTED NONE DETECTED   Cocaine NONE DETECTED NONE DETECTED   Benzodiazepines NONE DETECTED NONE DETECTED   Amphetamines NONE DETECTED NONE DETECTED   Tetrahydrocannabinol NONE DETECTED NONE DETECTED   Barbiturates NONE DETECTED NONE DETECTED    Comment:        DRUG SCREEN FOR MEDICAL PURPOSES ONLY.  IF CONFIRMATION IS NEEDED FOR ANY PURPOSE, NOTIFY LAB WITHIN 5 DAYS.        LOWEST DETECTABLE LIMITS FOR URINE DRUG SCREEN Drug Class       Cutoff (ng/mL) Amphetamine      1000 Barbiturate      200 Benzodiazepine   789 Tricyclics       381 Opiates          300 Cocaine          300 THC              50     Current Facility-Administered Medications  Medication Dose Route Frequency Provider Last Rate Last Dose  . acetaminophen (TYLENOL) tablet 650 mg  650 mg Oral Q4H PRN April Palumbo, MD      . alum & mag hydroxide-simeth (MAALOX/MYLANTA)  200-200-20 MG/5ML suspension 30 mL  30 mL Oral PRN April Palumbo, MD      . LORazepam (ATIVAN) tablet 1 mg  1 mg Oral Q8H PRN April Palumbo, MD      . ondansetron [facility]) tablet 4 mg  4 mg Oral Q8H PRN April Palumbo, MD      . zolpidem [facility]) tablet 5 mg  5 mg Oral QHS PRN April Palumbo, MD       Current Outpatient Prescriptions  Medication Sig Dispense Refill  . benztropine (COGENTIN) 0.5 MG tablet Take 1 tablet (0.5 mg total) by mouth at bedtime. 30 tablet 0  . divalproex (DEPAKOTE ER) 500 MG 24 hr tablet Take 500 mg by mouth at bedtime.     . fluticasone (FLONASE) 50 MCG/ACT nasal spray Place 2 sprays into both nostrils daily as needed for rhinitis.    Marland Kitchen gabapentin (NEURONTIN) 100 MG capsule Take 2 capsules (200 mg total) by mouth 2 (two) times daily. 120 capsule 1  . haloperidol (HALDOL) 5 MG tablet Take 5 mg by mouth at bedtime.     . ziprasidone (GEODON) 80 MG capsule Take 80 mg by mouth 3 (three) times daily.       Musculoskeletal: Strength & Muscle Tone: within normal limits Gait & Station: normal Patient leans: N/A  Psychiatric Specialty Exam: Physical Exam  Constitutional: He is oriented to person, place, and time. He appears well-developed and well-nourished.  HENT:  Head: Normocephalic.  Neck: Normal range of motion.  Respiratory: Effort normal.  Musculoskeletal: Normal range of motion.  Neurological: He is alert and oriented to person, place, and time.  Psychiatric: He has a normal mood and affect. His speech is normal and behavior is normal. Judgment and thought content normal. Cognition and memory are normal.    Review of Systems  Psychiatric/Behavioral: Positive for substance abuse.  All other systems reviewed and are negative.   Blood pressure 110/57, pulse 70, temperature 98.5 F (36.9 C), temperature source Oral, resp. rate 18, SpO2 98 %.There is no height or weight on file to calculate BMI.  General Appearance: Casual  Eye Contact:  Good  Speech:  Normal  Rate  Volume:  Normal  Mood:  Euthymic  Affect:  Congruent  Thought Process:  Coherent and Descriptions of Associations: Intact  Orientation:  Full (Time, Place, and Person)  Thought Content:  WDL and Logical  Suicidal Thoughts:  No  Homicidal Thoughts:  No  Memory:  Immediate;   Good Recent;   Good Remote;   Good  Judgement:  Fair  Insight:  Fair  Psychomotor Activity:  Normal  Concentration:  Concentration: Good and Attention Span: Good  Recall:  Good  Fund of Knowledge:  Fair  Language:  Good  Akathisia:  No  Handed:  Right  AIMS (if indicated):     Assets:  Housing Intimacy Leisure Time Physical Health Resilience Social Support  ADL's:  Intact  Cognition:  WNL  Sleep:        Treatment Plan Summary: Daily contact with patient to assess and evaluate symptoms and progress in treatment, Medication management and Plan schizoaffective disorder, bipolar type:  -Crisis stabilization -Medication management:  Restarted Depakote 500 mg at bedtime for mood stabilization, Haldol 5 mg at bedtime for psychosis, Cogentin 0.5 mg daily for EPS, Gabapentin 200 mg BID for mood stabilization, Geodon 80 mg TID for psychosis -Individual counseling  Disposition: No evidence of imminent risk to self or others at present.    Waylan Boga, NP 09/17/2016  10:18 AM  Patient seen face-to-face for psychiatric evaluation, chart reviewed and case discussed with the physician extender and developed treatment plan. Reviewed the information documented and agree with the treatment plan. Corena Pilgrim, MD

## 2016-09-17 NOTE — ED Provider Notes (Signed)
[facility]-EMERGENCY DEPT Provider Note   CSN: [id number] Arrival date & time: 09/16/16  2316     History   Chief Complaint Chief Complaint  Patient presents with  . Psychiatric Evaluation    HPI [name] [name] is a 36 y.o. male.  The history is provided by the patient.  Mental Health Problem  Presenting symptoms: aggressive behavior and agitation   Presenting symptoms: no suicide attempt   Degree of incapacity (severity):  Moderate Onset quality:  Gradual Timing:  Constant Progression:  Unchanged Chronicity:  Chronic Context: drug abuse   Treatment compliance:  Most of the time Relieved by:  Nothing Worsened by:  Nothing Ineffective treatments:  None tried Associated symptoms: no hyperventilation and no insomnia   Risk factors: hx of mental illness     Past Medical History:  Diagnosis Date  . Anxiety   . Depression   . Mental disorder   . Schizophrenia [facility](HCC)     Patient Active Problem List   Diagnosis Date Noted  . Schizoaffective disorder, bipolar type (HCC) 05/07/2016  . Cocaine abuse 05/07/2016  . Cannabis use disorder, moderate, in sustained remission (HCC) 04/03/2015  . Alcohol use disorder, moderate, in sustained remission (HCC) 04/03/2015    History reviewed. No pertinent surgical history.     Home Medications    Prior to Admission medications   Medication Sig Start Date End Date Taking? Authorizing Provider  benztropine (COGENTIN) 0.5 MG tablet Take 1 tablet (0.5 mg total) by mouth at bedtime. Patient not taking: Reported on 02/22/2016 04/08/15   [name], FNP  cetirizine (ZYRTEC ALLERGY) 10 MG tablet Take 1 tablet (10 mg total) by mouth daily. Patient not taking: Reported on 02/22/2016 11/24/15   [name], MD  citalopram (CELEXA) 20 MG tablet Take 1 tablet (20 mg total) by mouth daily. Patient not taking: Reported on 02/22/2016 04/08/15   [name], FNP  divalproex (DEPAKOTE ER) 500 MG 24 hr tablet Take 1 tablet by mouth at bedtime.  04/20/16   Historical Provider, MD  fluticasone (FLONASE) 50 MCG/ACT nasal spray Place 2 sprays into both nostrils daily. Patient not taking: Reported on 05/07/2016 11/24/15   [name], MD  gabapentin (NEURONTIN) 100 MG capsule Take 2 capsules (200 mg total) by mouth 2 (two) times daily. 05/07/16   Charm RingsJamison Y Lord, NP  haloperidol (HALDOL) 5 MG tablet Take 5 mg by mouth daily.    Historical Provider, MD  hydrOXYzine (ATARAX/VISTARIL) 25 MG tablet Take 1 tablet (25 mg total) by mouth 3 (three) times daily as needed for anxiety. Patient not taking: Reported on 02/22/2016 04/08/15   [name], FNP  traZODone (DESYREL) 100 MG tablet Take 1 tablet (100 mg total) by mouth at bedtime. Patient not taking: Reported on 02/22/2016 04/08/15   [name], FNP  ziprasidone (GEODON) 80 MG capsule Take 1 capsule by mouth 3 (three) times daily. 02/17/16   Historical Provider, MD    Family History Family History  Problem Relation Age of Onset  . Mental illness Neg Hx     Social History Social History  Substance Use Topics  . Smoking status: Current Every Day Smoker    Packs/day: 1.00    Types: Cigarettes  . Smokeless tobacco: Former NeurosurgeonUser  . Alcohol use Yes     Comment: Once a week. "When I am around my friends"      Allergies   Patient has no known allergies.   Review of Systems Review of Systems  Constitutional: Negative for fever.  Psychiatric/Behavioral: Positive for agitation. Negative for confusion. The patient does not have insomnia.   All other systems reviewed and are negative.    Physical Exam Updated Vital Signs BP 165/90 (BP Location: Left Arm)   Pulse 82   Temp 97.5 F (36.4 C) (Oral)   Resp 20   SpO2 97%   Physical Exam  Constitutional: He is oriented to person, place, and time. He appears well-developed and well-nourished. No distress.  HENT:  Head: Normocephalic and atraumatic.  Mouth/Throat: No oropharyngeal exudate.  Eyes: EOM are normal. Pupils are  equal, round, and reactive to light.  Neck: Normal range of motion. Neck supple. No JVD present.  Cardiovascular: Normal rate, regular rhythm, normal heart sounds and intact distal pulses.   Pulmonary/Chest: Effort normal and breath sounds normal. No stridor. No respiratory distress. He has no wheezes. He has no rales.  Abdominal: Soft. Bowel sounds are normal. He exhibits no mass. There is no tenderness. There is no rebound and no guarding.  Musculoskeletal: Normal range of motion. He exhibits no edema or tenderness.  Neurological: He is alert and oriented to person, place, and time.  Skin: Skin is warm and dry. Capillary refill takes less than 2 seconds.  Psychiatric: Thought content normal.     ED Treatments / Results   Vitals:   09/16/16 2329  BP: 165/90  Pulse: 82  Resp: 20  Temp: 97.5 F (36.4 C)    Labs (all labs ordered are listed, but only abnormal results are displayed)  Results for orders placed or performed during the hospital encounter of 09/17/16  Comprehensive metabolic panel  Result Value Ref Range   Sodium 136 135 - 145 mmol/L   Potassium 3.7 3.5 - 5.1 mmol/L   Chloride 106 101 - 111 mmol/L   CO2 21 (L) 22 - 32 mmol/L   Glucose, Bld 159 (H) 65 - 99 mg/dL   BUN 9 6 - 20 mg/dL   Creatinine, Ser 6.57 0.61 - 1.24 mg/dL   Calcium 9.1 8.9 - 84.6 mg/dL   Total Protein 7.6 6.5 - 8.1 g/dL   Albumin 4.3 3.5 - 5.0 g/dL   AST 40 15 - 41 U/L   ALT 60 17 - 63 U/L   Alkaline Phosphatase 48 38 - 126 U/L   Total Bilirubin 0.4 0.3 - 1.2 mg/dL   GFR calc non Af Amer >60 >60 mL/min   GFR calc Af Amer >60 >60 mL/min   Anion gap 9 5 - 15  Ethanol  Result Value Ref Range   Alcohol, Ethyl (B) <5 <5 mg/dL  Salicylate level  Result Value Ref Range   Salicylate Lvl <7.0 2.8 - 30.0 mg/dL  Acetaminophen level  Result Value Ref Range   Acetaminophen (Tylenol), Serum <10 (L) 10 - 30 ug/mL  cbc  Result Value Ref Range   WBC 6.5 4.0 - 10.5 K/uL   RBC 5.53 4.22 - 5.81 MIL/uL     Hemoglobin 14.6 13.0 - 17.0 g/dL   HCT 96.2 95.2 - 84.1 %   MCV 80.7 78.0 - 100.0 fL   MCH 26.4 26.0 - 34.0 pg   MCHC 32.7 30.0 - 36.0 g/dL   RDW 32.4 40.1 - 02.7 %   Platelets 224 150 - 400 K/uL   No results found.   Procedures Procedures (including critical care time)       Final Clinical Impressions(s) / ED Diagnoses  Depression: medically cleared for psychiatry.  Disposition per them.  New Prescriptions New Prescriptions   No medications on file     Sheera Illingworth, MD 09/17/16 0126

## 2016-09-17 NOTE — [facility] Suicide Risk Assessment (Signed)
Suicide Risk Assessment  Discharge Assessment   [facility]   Principal Problem: Schizoaffective disorder, bipolar type [name]HCC) Discharge Diagnoses:  Patient Active Problem List   Diagnosis Date Noted  . Schizoaffective disorder, bipolar type (HCC) [F25.0] 05/07/2016    Priority: High  . Cocaine abuse [F14.10] 05/07/2016    Priority: High  . Cannabis use disorder, moderate, in sustained remission (HCC) [F12.21] 04/03/2015    Priority: High  . Alcohol use disorder, moderate, in sustained remission (HCC) [F10.21] 04/03/2015    Priority: High    Total Time spent with patient: 45 minutes   Musculoskeletal: Strength & Muscle Tone: within normal limits Gait & Station: normal Patient leans: N/A  Psychiatric Specialty Exam: Physical Exam  Constitutional: He is oriented to person, place, and time. He appears well-developed and well-nourished.  HENT:  Head: Normocephalic.  Neck: Normal range of motion.  Respiratory: Effort normal.  Musculoskeletal: Normal range of motion.  Neurological: He is alert and oriented to person, place, and time.  Psychiatric: He has a normal mood and affect. His speech is normal and behavior is normal. Judgment and thought content normal. Cognition and memory are normal.    Review of Systems  Psychiatric/Behavioral: Positive for substance abuse.  All other systems reviewed and are negative.   Blood pressure 110/57, pulse 70, temperature 98.5 F (36.9 C), temperature source Oral, resp. rate 18, SpO2 98 %.There is no height or weight on file to calculate BMI.  General Appearance: Casual  Eye Contact:  Good  Speech:  Normal Rate  Volume:  Normal  Mood:  Euthymic  Affect:  Congruent  Thought Process:  Coherent and Descriptions of Associations: Intact  Orientation:  Full (Time, Place, and Person)  Thought Content:  WDL and Logical  Suicidal Thoughts:  No  Homicidal Thoughts:  No  Memory:  Immediate;   Good Recent;    Good Remote;   Good  Judgement:  Fair  Insight:  Fair  Psychomotor Activity:  Normal  Concentration:  Concentration: Good and Attention Span: Good  Recall:  Good  Fund of Knowledge:  Fair  Language:  Good  Akathisia:  No  Handed:  Right  AIMS (if indicated):     Assets:  Housing Intimacy Leisure Time Physical Health Resilience Social Support  ADL's:  Intact  Cognition:  WNL  Sleep:      Mental Status Per Nursing Assessment::   On Admission:   suicidal ideations  Demographic Factors:  Male  Loss Factors: NA  Historical Factors: NA  Risk Reduction Factors:   Sense of responsibility to family, Living with another person, especially a relative, Positive social support and Positive therapeutic relationship  Continued Clinical Symptoms:  None  Cognitive Features That Contribute To Risk:  None    Suicide Risk:  Minimal: No identifiable suicidal ideation.  Patients presenting with no risk factors but with morbid ruminations; may be classified as minimal risk based on the severity of the depressive symptoms    Plan Of Care/Follow-up recommendations:  Activity:  as tolerated Diet:  heart healthy diet  Roth Ress, NP 09/17/2016, 10:25 AM

## 2016-09-17 NOTE — Discharge Instructions (Signed)
For your ongoing behavioral health needs you are advised to follow up with [facility].  Call them at your earliest opportunity to ask about scheduling an intake appointment:       [facility]      [address]      , [place] [id number]      [phone]

## 2016-09-17 NOTE — ED Notes (Signed)
Pt discharged ambulatory with resources and discharge instructions reviewed.  All belongings were returned to patient.  Pt was in no distress at discharge.

## 2016-09-21 DIAGNOSIS — F209 Schizophrenia, unspecified: Secondary | ICD-10-CM | POA: Diagnosis not present

## 2016-11-21 ENCOUNTER — Emergency Department (HOSPITAL_COMMUNITY)
Admission: EM | Admit: 2016-11-21 | Discharge: 2016-11-21 | Discharge status: Against Medical Advice | Payer: Medicare Other | Attending: Emergency Medicine | Admitting: Emergency Medicine

## 2016-11-21 ENCOUNTER — Encounter (HOSPITAL_COMMUNITY): Payer: Self-pay | Admitting: Oncology

## 2016-11-21 DIAGNOSIS — R0602 Shortness of breath: Secondary | ICD-10-CM | POA: Diagnosis not present

## 2016-11-21 DIAGNOSIS — R079 Chest pain, unspecified: Secondary | ICD-10-CM | POA: Insufficient documentation

## 2016-11-21 DIAGNOSIS — Z5321 Procedure and treatment not carried out due to patient leaving prior to being seen by health care provider: Secondary | ICD-10-CM | POA: Insufficient documentation

## 2016-11-21 DIAGNOSIS — F209 Schizophrenia, unspecified: Secondary | ICD-10-CM | POA: Insufficient documentation

## 2016-11-21 LAB — CBC
HCT: 46.1 % (ref 39.0–52.0)
Hemoglobin: 15.3 g/dL (ref 13.0–17.0)
MCH: 27.1 pg (ref 26.0–34.0)
MCHC: 33.2 g/dL (ref 30.0–36.0)
MCV: 81.6 fL (ref 78.0–100.0)
PLATELETS: 271 10*3/uL (ref 150–400)
RBC: 5.65 MIL/uL (ref 4.22–5.81)
RDW: 12.9 % (ref 11.5–15.5)
WBC: 7.8 10*3/uL (ref 4.0–10.5)

## 2016-11-21 NOTE — ED Triage Notes (Signed)
Pt states that he has schizophrenia and has been using Meth and THC, "A lot."  Pt reports hallucinating, "I don't know if it's reality."  +AVH.  Pt keeps stating he can't breath however is speaking in full sentences.  Pt reports taking his nighttime medication tonight however is non compliant w/ medications stating, "I take it when I can."  Will not elaborate as to if this is d/t financial concern.

## 2016-11-21 NOTE — ED Notes (Signed)
Per Susy FrizzleMatt, RN, pt left stating, "I'm too angry to be here."  Pt had denied SI/HI.

## 2016-11-22 LAB — COMPREHENSIVE METABOLIC PANEL
ALK PHOS: 53 U/L (ref 38–126)
ALT: 50 U/L (ref 17–63)
ANION GAP: 9 (ref 5–15)
AST: 29 U/L (ref 15–41)
Albumin: 4.2 g/dL (ref 3.5–5.0)
BILIRUBIN TOTAL: 0.2 mg/dL — AB (ref 0.3–1.2)
BUN: 13 mg/dL (ref 6–20)
CALCIUM: 9.2 mg/dL (ref 8.9–10.3)
CO2: 23 mmol/L (ref 22–32)
CREATININE: 1.04 mg/dL (ref 0.61–1.24)
Chloride: 108 mmol/L (ref 101–111)
GFR calc Af Amer: >60 mL/min (ref >60)
Glucose, Bld: 121 mg/dL — ABNORMAL HIGH (ref 65–99)
Potassium: 3.7 mmol/L (ref 3.5–5.1)
Sodium: 140 mmol/L (ref 135–145)
TOTAL PROTEIN: 7.2 g/dL (ref 6.5–8.1)

## 2016-11-22 LAB — ETHANOL

## 2016-11-22 LAB — SALICYLATE LEVEL

## 2016-11-22 LAB — ACETAMINOPHEN LEVEL: Acetaminophen (Tylenol), Serum: <10 ug/mL — ABNORMAL LOW (ref 10–30)

## 2016-12-07 ENCOUNTER — Ambulatory Visit (HOSPITAL_COMMUNITY): Payer: Self-pay | Admitting: Licensed Clinical Social Worker

## 2017-01-14 DIAGNOSIS — F209 Schizophrenia, unspecified: Secondary | ICD-10-CM | POA: Diagnosis not present

## 2017-01-15 DIAGNOSIS — F209 Schizophrenia, unspecified: Secondary | ICD-10-CM | POA: Diagnosis not present

## 2017-03-19 DIAGNOSIS — Z0000 Encounter for general adult medical examination without abnormal findings: Secondary | ICD-10-CM | POA: Diagnosis not present

## 2017-04-01 DIAGNOSIS — F209 Schizophrenia, unspecified: Secondary | ICD-10-CM | POA: Diagnosis not present

## 2017-05-07 DIAGNOSIS — H40033 Anatomical narrow angle, bilateral: Secondary | ICD-10-CM | POA: Diagnosis not present

## 2017-05-07 DIAGNOSIS — H04123 Dry eye syndrome of bilateral lacrimal glands: Secondary | ICD-10-CM | POA: Diagnosis not present

## 2017-08-09 DIAGNOSIS — F209 Schizophrenia, unspecified: Secondary | ICD-10-CM | POA: Diagnosis not present

## 2017-08-18 ENCOUNTER — Other Ambulatory Visit: Payer: Self-pay

## 2017-08-18 ENCOUNTER — Encounter (HOSPITAL_COMMUNITY): Payer: Self-pay

## 2017-08-18 ENCOUNTER — Emergency Department (HOSPITAL_COMMUNITY)
Admission: EM | Admit: 2017-08-18 | Discharge: 2017-08-20 | Disposition: A | Discharge status: Other Acute Inpatient Hospital | Payer: Medicare Other | Attending: Emergency Medicine | Admitting: Emergency Medicine

## 2017-08-18 DIAGNOSIS — Z720 Tobacco use: Secondary | ICD-10-CM

## 2017-08-18 DIAGNOSIS — F600 Paranoid personality disorder: Secondary | ICD-10-CM | POA: Diagnosis not present

## 2017-08-18 DIAGNOSIS — F250 Schizoaffective disorder, bipolar type: Secondary | ICD-10-CM | POA: Diagnosis present

## 2017-08-18 DIAGNOSIS — F419 Anxiety disorder, unspecified: Secondary | ICD-10-CM | POA: Insufficient documentation

## 2017-08-18 DIAGNOSIS — R45850 Homicidal ideations: Secondary | ICD-10-CM | POA: Diagnosis not present

## 2017-08-18 DIAGNOSIS — Z79899 Other long term (current) drug therapy: Secondary | ICD-10-CM | POA: Diagnosis not present

## 2017-08-18 DIAGNOSIS — R440 Auditory hallucinations: Secondary | ICD-10-CM | POA: Diagnosis not present

## 2017-08-18 DIAGNOSIS — R456 Violent behavior: Secondary | ICD-10-CM | POA: Diagnosis not present

## 2017-08-18 DIAGNOSIS — Z046 Encounter for general psychiatric examination, requested by authority: Secondary | ICD-10-CM | POA: Diagnosis present

## 2017-08-18 DIAGNOSIS — F22 Delusional disorders: Secondary | ICD-10-CM | POA: Diagnosis not present

## 2017-08-18 DIAGNOSIS — F17210 Nicotine dependence, cigarettes, uncomplicated: Secondary | ICD-10-CM | POA: Insufficient documentation

## 2017-08-18 DIAGNOSIS — Z008 Encounter for other general examination: Secondary | ICD-10-CM

## 2017-08-18 DIAGNOSIS — R4689 Other symptoms and signs involving appearance and behavior: Secondary | ICD-10-CM

## 2017-08-18 LAB — COMPREHENSIVE METABOLIC PANEL
ALBUMIN: 4.6 g/dL (ref 3.5–5.0)
ALK PHOS: 43 U/L (ref 38–126)
ALT: 41 U/L (ref 17–63)
AST: 29 U/L (ref 15–41)
Anion gap: 11 (ref 5–15)
BUN: 17 mg/dL (ref 6–20)
CALCIUM: 9.2 mg/dL (ref 8.9–10.3)
CO2: 23 mmol/L (ref 22–32)
CREATININE: 1.16 mg/dL (ref 0.61–1.24)
Chloride: 103 mmol/L (ref 101–111)
GFR calc Af Amer: >60 mL/min (ref >60)
GFR calc non Af Amer: >60 mL/min (ref >60)
GLUCOSE: 100 mg/dL — AB (ref 65–99)
Potassium: 3.6 mmol/L (ref 3.5–5.1)
SODIUM: 137 mmol/L (ref 135–145)
Total Bilirubin: 0.5 mg/dL (ref 0.3–1.2)
Total Protein: 8.2 g/dL — ABNORMAL HIGH (ref 6.5–8.1)

## 2017-08-18 LAB — CBC
HEMATOCRIT: 41.9 % (ref 39.0–52.0)
HEMOGLOBIN: 14.2 g/dL (ref 13.0–17.0)
MCH: 27.3 pg (ref 26.0–34.0)
MCHC: 33.9 g/dL (ref 30.0–36.0)
MCV: 80.4 fL (ref 78.0–100.0)
Platelets: 227 10*3/uL (ref 150–400)
RBC: 5.21 MIL/uL (ref 4.22–5.81)
RDW: 13.7 % (ref 11.5–15.5)
WBC: 8.4 10*3/uL (ref 4.0–10.5)

## 2017-08-18 LAB — RAPID URINE DRUG SCREEN, HOSP PERFORMED
AMPHETAMINES: NOT DETECTED
BARBITURATES: NOT DETECTED
BENZODIAZEPINES: NOT DETECTED
COCAINE: NOT DETECTED
Opiates: NOT DETECTED
TETRAHYDROCANNABINOL: NOT DETECTED

## 2017-08-18 LAB — ACETAMINOPHEN LEVEL: Acetaminophen (Tylenol), Serum: <10 ug/mL — ABNORMAL LOW (ref 10–30)

## 2017-08-18 LAB — SALICYLATE LEVEL: Salicylate Lvl: <7 mg/dL (ref 2.8–30.0)

## 2017-08-18 LAB — ETHANOL: Alcohol, Ethyl (B): <10 mg/dL (ref <10)

## 2017-08-18 MED ORDER — FLUTICASONE PROPIONATE 50 MCG/ACT NA SUSP
2.0000 | Freq: Every day | NASAL | Status: DC | PRN
  Filled 2017-08-18: qty 16

## 2017-08-18 MED ORDER — ZIPRASIDONE HCL 20 MG PO CAPS
80.0000 mg | ORAL_CAPSULE | Freq: Three times a day (TID) | ORAL | Status: DC
  Administered 2017-08-19 – 2017-08-20 (×5): 80 mg via ORAL
  Filled 2017-08-18 (×5): qty 4

## 2017-08-18 MED ORDER — DIVALPROEX SODIUM ER 500 MG PO TB24
500.0000 mg | ORAL_TABLET | Freq: Every day | ORAL | Status: DC
  Administered 2017-08-19 (×2): 500 mg via ORAL
  Filled 2017-08-18 (×2): qty 1

## 2017-08-18 MED ORDER — ZOLPIDEM TARTRATE 5 MG PO TABS: 5.0000 mg | ORAL_TABLET | Freq: Every evening | ORAL | Status: DC | PRN

## 2017-08-18 MED ORDER — ALUM & MAG HYDROXIDE-SIMETH 200-200-20 MG/5ML PO SUSP: 30.0000 mL | Freq: Four times a day (QID) | ORAL | Status: DC | PRN

## 2017-08-18 MED ORDER — HALOPERIDOL 5 MG PO TABS
5.0000 mg | ORAL_TABLET | Freq: Every day | ORAL | Status: DC
  Administered 2017-08-19 (×2): 5 mg via ORAL
  Filled 2017-08-18 (×2): qty 1

## 2017-08-18 MED ORDER — ACETAMINOPHEN 325 MG PO TABS
650.0000 mg | ORAL_TABLET | ORAL | Status: DC | PRN
Start: 2017-08-18 — End: 2017-08-20

## 2017-08-18 MED ORDER — ONDANSETRON HCL 4 MG PO TABS: 4.0000 mg | ORAL_TABLET | Freq: Three times a day (TID) | ORAL | Status: DC | PRN

## 2017-08-18 MED ORDER — NICOTINE 21 MG/24HR TD PT24: 21.0000 mg | MEDICATED_PATCH | Freq: Every day | TRANSDERMAL | Status: DC

## 2017-08-18 MED ORDER — BENZTROPINE MESYLATE 0.5 MG PO TABS
0.5000 mg | ORAL_TABLET | Freq: Every day | ORAL | Status: DC
  Administered 2017-08-19 (×2): 0.5 mg via ORAL
  Filled 2017-08-18 (×2): qty 1

## 2017-08-18 NOTE — ED Triage Notes (Signed)
Patient arrives by [employer]-awaiting IVC papers-patient states he threatened his family. Patient is currently in forensic restraints.

## 2017-08-18 NOTE — ED Provider Notes (Signed)
[facility]-EMERGENCY DEPT Provider Note   CSN: [id number] Arrival date & time: 08/18/17  2155     History   Chief Complaint Chief Complaint  Patient presents with  . IVC  . Homicidal    HPI [name] [name] is a 37 y.o. male with a PMHx of schizophrenia, anxiety, and other conditions listed below, who presents to the ED via [employer] apparently for homicidal threats.  Per [employer], sister-in-law and brother called 911 stating that the patient was threatening to kill them and had a butcher's knife in his hand.  They are in the process of getting IVC paperwork out.  [employer] states that he came out from the house willingly and he was quickly handcuffed and brought in.  Patient states that his medications are not helping and he thinks that his schizophrenia is causing to have racing thoughts, paranoid thoughts, anxiety, auditory hallucinations, and agitation stating that he gets "mad easily for no reason".  He reports that sometimes he will just get mad looking at his dad or his family members.  He reports that he is taking ziprasidone, benztropine, hydroxyzine, Haldol, and Depakote compliantly, last took his doses at 4 PM.  He cannot recall the dosages on most of these.  He denies SI, HI, visual hallucinations, drug use, or alcohol use.  He reports being a cigarette smoker however he wants to quit.  He denies any other medical complaints and is here currently voluntarily however IVC paperwork is on its way.   The history is provided by the patient, medical records and the police. No language interpreter was used.  Mental Health Problem  Presenting symptoms: aggressive behavior and hallucinations   Presenting symptoms: no homicidal ideas (pt denies) and no suicidal thoughts   Patient accompanied by:  Law enforcement Onset quality:  Unable to specify Timing:  Intermittent Progression:  Waxing and waning Chronicity:  Recurrent Treatment compliance:  All of the time Time since last  psychoactive medication taken:  7 hours Relieved by:  Nothing Worsened by:  Family interactions Ineffective treatments:  None tried Associated symptoms: anxiety   Associated symptoms: no abdominal pain and no chest pain   Risk factors: hx of mental illness     Past Medical History:  Diagnosis Date  . Anxiety   . Depression   . Mental disorder   . Schizophrenia [facility])     Patient Active Problem List   Diagnosis Date Noted  . Schizoaffective disorder, bipolar type (HCC) 05/07/2016  . Cocaine abuse (HCC) 05/07/2016  . Cannabis use disorder, moderate, in sustained remission (HCC) 04/03/2015  . Alcohol use disorder, moderate, in sustained remission (HCC) 04/03/2015    History reviewed. No pertinent surgical history.     Home Medications    Prior to Admission medications   Medication Sig Start Date End Date Taking? Authorizing Provider  benztropine (COGENTIN) 0.5 MG tablet Take 1 tablet (0.5 mg total) by mouth at bedtime. 04/08/15   Withrow, Everardo All, FNP  divalproex (DEPAKOTE ER) 500 MG 24 hr tablet Take 500 mg by mouth at bedtime.     [provider]  fluticasone (FLONASE) 50 MCG/ACT nasal spray Place 2 sprays into both nostrils daily as needed for rhinitis.    [provider]  gabapentin (NEURONTIN) 100 MG capsule Take 2 capsules (200 mg total) by mouth 2 (two) times daily. 05/07/16   Charm Rings, NP  haloperidol (HALDOL) 5 MG tablet Take 5 mg by mouth at bedtime.     [provider]  ziprasidone (GEODON) 80 MG capsule Take 80 mg by mouth 3 (three) times daily.     [provider]    Family History Family History  Problem Relation Age of Onset  . Mental illness Neg Hx     Social History Social History   Tobacco Use  . Smoking status: Current Every Day Smoker    Packs/day: 1.00    Types: Cigarettes  . Smokeless tobacco: Former Engineer, water Use Topics  . Alcohol use: Yes    Comment: Once a week. "When I am around my friends"     . Drug use: Yes    Types: Marijuana, Methamphetamines    Comment: Last used: 2 weeks ago     Allergies   Patient has no known allergies.   Review of Systems Review of Systems  Constitutional: Negative for chills and fever.  Respiratory: Negative for shortness of breath.   Cardiovascular: Negative for chest pain.  Gastrointestinal: Negative for abdominal pain, constipation, diarrhea, nausea and vomiting.  Genitourinary: Negative for dysuria and hematuria.  Musculoskeletal: Negative for arthralgias and myalgias.  Skin: Negative for color change.  Allergic/Immunologic: Negative for immunocompromised state.  Neurological: Negative for weakness and numbness.  Psychiatric/Behavioral: Positive for hallucinations. Negative for confusion, homicidal ideas (pt denies) and suicidal ideas. The patient is nervous/anxious.    All other systems reviewed and are negative for acute change except as noted in the HPI.    Physical Exam Updated Vital Signs BP 126/83 (BP Location: Right Arm)   Pulse 88   Temp 98.6 F (37 C) (Oral)   Resp 16   SpO2 97%   Physical Exam  Constitutional: He is oriented to person, place, and time. Vital signs are normal. He appears well-developed and well-nourished.  Non-toxic appearance. No distress.  Afebrile, nontoxic, NAD  HENT:  Head: Normocephalic and atraumatic.  Mouth/Throat: Oropharynx is clear and moist and mucous membranes are normal.  Eyes: Conjunctivae and EOM are normal. Right eye exhibits no discharge. Left eye exhibits no discharge.  Neck: Normal range of motion. Neck supple.  Cardiovascular: Normal rate, regular rhythm, normal heart sounds and intact distal pulses. Exam reveals no gallop and no friction rub.  No murmur heard. Pulmonary/Chest: Effort normal and breath sounds normal. No respiratory distress. He has no decreased breath sounds. He has no wheezes. He has no rhonchi. He has no rales.  Abdominal: Soft. Normal appearance and bowel sounds  are normal. He exhibits no distension. There is no tenderness. There is no rigidity, no rebound, no guarding, no CVA tenderness, no tenderness at McBurney's point and negative Murphy's sign.  Musculoskeletal: Normal range of motion.  Neurological: He is alert and oriented to person, place, and time. He has normal strength. No sensory deficit.  Skin: Skin is warm, dry and intact. No rash noted.  Psychiatric: His mood appears anxious. He is actively hallucinating. He expresses no homicidal and no suicidal ideation. He expresses no suicidal plans and no homicidal plans.  Anxious affect, but pleasant and cooperative. Denies SI or HI, reports auditory hallucinations without visual hallucinations, doesn't seem to be responding to internal stimuli.   Nursing note and vitals reviewed.    ED Treatments / Results  Labs (all labs ordered are listed, but only abnormal results are displayed) Labs Reviewed  COMPREHENSIVE METABOLIC PANEL - Abnormal; Notable for the following components:      Result Value   Glucose, Bld 100 (*)    Total Protein 8.2 (*)    All  other components within normal limits  ACETAMINOPHEN LEVEL - Abnormal; Notable for the following components:   Acetaminophen (Tylenol), Serum <10 (*)    All other components within normal limits  ETHANOL  SALICYLATE LEVEL  CBC  RAPID URINE DRUG SCREEN, HOSP PERFORMED    EKG  EKG Interpretation None       Radiology No results found.  Procedures Procedures (including critical care time)  Medications Ordered in ED Medications  benztropine (COGENTIN) tablet 0.5 mg (not administered)  divalproex (DEPAKOTE ER) 24 hr tablet 500 mg (not administered)  fluticasone (FLONASE) 50 MCG/ACT nasal spray 2 spray (not administered)  haloperidol (HALDOL) tablet 5 mg (not administered)  ziprasidone (GEODON) capsule 80 mg (not administered)  acetaminophen (TYLENOL) tablet 650 mg (not administered)  zolpidem (AMBIEN) tablet 5 mg (not administered)    ondansetron (ZOFRAN) tablet 4 mg (not administered)  alum & mag hydroxide-simeth (MAALOX/MYLANTA) 200-200-20 MG/5ML suspension 30 mL (not administered)  nicotine (NICODERM CQ - dosed in mg/24 hours) patch 21 mg (not administered)     Initial Impression / Assessment and Plan / ED Course  I have reviewed the triage vital signs and the nursing notes.  Pertinent labs & imaging results that were available during my care of the patient were reviewed by me and considered in my medical decision making (see chart for details).     37 y.o. male here after an issue at home involving his sister-in-law and brother where he apparently threatened to kill them and had a butcher's knife in his hand.  They are in the process of getting IVC paperwork out.  Patient states that he is been having racing thoughts, gets mad easily for no reason, and having paranoid thoughts.  He occasionally hears voices talking to him, and he is having anxiety.  He denies SI, HI, VH, drug use, or alcohol use.  He endorses tobacco use but states that he wants to quit, cessation advised.  He has no medical complaints at this time and is here currently voluntarily however IVC paperwork is being taken out.  On exam, patient is very anxious however otherwise is very cooperative and pleasant.  We will get psych labs and reassess.  11:12 PM CBC WNL. CMP WNL. EtOH level undetectable. Salicylate and acetaminophen levels WNL. UDS negative. Pt medically cleared at this time. Psych hold orders and home med orders placed. Please see TTS notes for further documentation of care/dispo. Pt stable at time of med clearance.     Final Clinical Impressions(s) / ED Diagnoses   Final diagnoses:  Homicidal ideation  Anxiety  Aggressive behavior  Paranoid ideation (HCC)  Involuntary commitment  Medical clearance for psychiatric admission  Tobacco user    ED Discharge Orders    [address]       Marvens Hollars, SpiveyMercedes, [place] JerseyPA-C 08/18/17 2312    Bethann BerkshireZammit,  Joseph, MD 08/19/17 1702

## 2017-08-18 NOTE — ED Notes (Signed)
613.00$ locked up in security. Patient has 1 bag belongings at the nurses desk

## 2017-08-19 NOTE — ED Notes (Signed)
Pt taking a shower 

## 2017-08-19 NOTE — ED Notes (Signed)
Message left for sheriff for transport, awaiting call back.  

## 2017-08-19 NOTE — ED Notes (Signed)
Sheriff called back, informed they can not transport pt till tomorrow morning. Will notify oncoming shift.

## 2017-08-19 NOTE — ED Notes (Signed)
Pt guarded, forwards little with this nurse,Blunted affect. Pt compliant with medication regimen. encouragement and support provided. Special checks q 15 mins in place for safety, Video monitoring in place. Will continue to monitor.

## 2017-08-19 NOTE — Progress Notes (Addendum)
CSW [facility]. CSW provided phone number for nurse caring for pt.   5:05 Sh[facility]. Informed CSW that they have a bed available for pt. [facility] [place] [address]   Accepting Doctor: Dr. Wendall StadeKohl Report number: [phone]: [phone].  [facility] can go to [facility] now. CSW notified pt's nurse.   Montine CircleKelsy Alayna Mabe, Silverio LayLCSWA [employer]  [phone]
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
## 2017-08-19 NOTE — ED Notes (Signed)
This nurse spoke with Levon HedgerKathy,[facility], informed her that sheriff will not be able to transport pt till tomorrow morning.

## 2017-08-19 NOTE — BH Assessment (Signed)
[facility] Assessment Progress Note  Per Juanetta BeetsJacqueline Norman, DO, this pt requires psychiatric hospitalization at this time.  Pt presents under IVC initiated by pt's sister, and upheld by Dr Sharma CovertNorman.  The following facilities have been contacted to seek placement for this pt, with results as noted:  Beds available, information sent, decision pending:  [facility]   At capacity:  [facility]   Montee Tallman, [place] Behavioral Health Coordinator [phone]

## 2017-08-19 NOTE — ED Notes (Signed)
SBAR Report received from previous nurse. Pt received calm and visible on unit. Pt denies current SI/ HI, A/V H, depression, anxiety, or pain at this time, and appears otherwise stable and free of distress. Pt reminded of camera surveillance, q 15 min rounds, and rules of the milieu. Will continue to assess. 

## 2017-08-20 DIAGNOSIS — Z9114 Patient's other noncompliance with medication regimen: Secondary | ICD-10-CM | POA: Diagnosis not present

## 2017-08-20 DIAGNOSIS — F22 Delusional disorders: Secondary | ICD-10-CM | POA: Diagnosis not present

## 2017-08-20 DIAGNOSIS — R45850 Homicidal ideations: Secondary | ICD-10-CM | POA: Diagnosis not present

## 2017-08-20 DIAGNOSIS — F419 Anxiety disorder, unspecified: Secondary | ICD-10-CM | POA: Diagnosis not present

## 2017-08-20 DIAGNOSIS — F17210 Nicotine dependence, cigarettes, uncomplicated: Secondary | ICD-10-CM | POA: Diagnosis present

## 2017-08-20 DIAGNOSIS — R440 Auditory hallucinations: Secondary | ICD-10-CM | POA: Diagnosis not present

## 2017-08-20 DIAGNOSIS — F250 Schizoaffective disorder, bipolar type: Secondary | ICD-10-CM | POA: Diagnosis present

## 2017-08-20 NOTE — ED Notes (Signed)
[facility]iff on unit to [facility] per MD order. Personal property given to sheriff for transport. Pt ambulatory off unit in sheriff custody.

## 2017-09-21 DIAGNOSIS — F209 Schizophrenia, unspecified: Secondary | ICD-10-CM | POA: Diagnosis not present

## 2017-10-19 DIAGNOSIS — F209 Schizophrenia, unspecified: Secondary | ICD-10-CM | POA: Diagnosis not present

## 2017-11-09 DIAGNOSIS — F209 Schizophrenia, unspecified: Secondary | ICD-10-CM | POA: Diagnosis not present

## 2017-11-16 DIAGNOSIS — F209 Schizophrenia, unspecified: Secondary | ICD-10-CM | POA: Diagnosis not present

## 2017-11-18 DIAGNOSIS — M7661 Achilles tendinitis, right leg: Secondary | ICD-10-CM | POA: Diagnosis not present

## 2017-11-18 DIAGNOSIS — F200 Paranoid schizophrenia: Secondary | ICD-10-CM | POA: Diagnosis not present

## 2017-12-16 ENCOUNTER — Other Ambulatory Visit: Payer: Self-pay

## 2017-12-16 ENCOUNTER — Emergency Department (HOSPITAL_COMMUNITY)
Admission: EM | Admit: 2017-12-16 | Discharge: 2017-12-17 | Disposition: A | Discharge status: Other Acute Inpatient Hospital | Payer: Medicare Other | Attending: Emergency Medicine | Admitting: Emergency Medicine

## 2017-12-16 ENCOUNTER — Encounter (HOSPITAL_COMMUNITY): Payer: Self-pay

## 2017-12-16 DIAGNOSIS — F250 Schizoaffective disorder, bipolar type: Secondary | ICD-10-CM | POA: Insufficient documentation

## 2017-12-16 DIAGNOSIS — R45850 Homicidal ideations: Secondary | ICD-10-CM | POA: Diagnosis not present

## 2017-12-16 DIAGNOSIS — R45851 Suicidal ideations: Secondary | ICD-10-CM | POA: Diagnosis not present

## 2017-12-16 DIAGNOSIS — F200 Paranoid schizophrenia: Secondary | ICD-10-CM | POA: Diagnosis not present

## 2017-12-16 DIAGNOSIS — F17210 Nicotine dependence, cigarettes, uncomplicated: Secondary | ICD-10-CM | POA: Insufficient documentation

## 2017-12-16 DIAGNOSIS — Z79899 Other long term (current) drug therapy: Secondary | ICD-10-CM | POA: Insufficient documentation

## 2017-12-16 DIAGNOSIS — F259 Schizoaffective disorder, unspecified: Secondary | ICD-10-CM | POA: Diagnosis not present

## 2017-12-16 LAB — CBC
HCT: 42.8 % (ref 39.0–52.0)
HEMOGLOBIN: 14.2 g/dL (ref 13.0–17.0)
MCH: 26.9 pg (ref 26.0–34.0)
MCHC: 33.2 g/dL (ref 30.0–36.0)
MCV: 81.1 fL (ref 78.0–100.0)
Platelets: 219 10*3/uL (ref 150–400)
RBC: 5.28 MIL/uL (ref 4.22–5.81)
RDW: 13.7 % (ref 11.5–15.5)
WBC: 7.6 10*3/uL (ref 4.0–10.5)

## 2017-12-16 LAB — COMPREHENSIVE METABOLIC PANEL
ALT: 51 U/L (ref 17–63)
ANION GAP: 10 (ref 5–15)
AST: 35 U/L (ref 15–41)
Albumin: 4.8 g/dL (ref 3.5–5.0)
Alkaline Phosphatase: 44 U/L (ref 38–126)
BUN: 10 mg/dL (ref 6–20)
CHLORIDE: 100 mmol/L — AB (ref 101–111)
CO2: 25 mmol/L (ref 22–32)
Calcium: 9.5 mg/dL (ref 8.9–10.3)
Creatinine, Ser: 1.03 mg/dL (ref 0.61–1.24)
GFR calc Af Amer: >60 mL/min (ref >60)
GFR calc non Af Amer: >60 mL/min (ref >60)
Glucose, Bld: 110 mg/dL — ABNORMAL HIGH (ref 65–99)
Potassium: 3.6 mmol/L (ref 3.5–5.1)
SODIUM: 135 mmol/L (ref 135–145)
Total Bilirubin: 0.7 mg/dL (ref 0.3–1.2)
Total Protein: 8.4 g/dL — ABNORMAL HIGH (ref 6.5–8.1)

## 2017-12-16 LAB — RAPID URINE DRUG SCREEN, HOSP PERFORMED
AMPHETAMINES: POSITIVE — AB
Benzodiazepines: NOT DETECTED
COCAINE: NOT DETECTED
Opiates: NOT DETECTED
TETRAHYDROCANNABINOL: NOT DETECTED

## 2017-12-16 LAB — ACETAMINOPHEN LEVEL

## 2017-12-16 LAB — SALICYLATE LEVEL: Salicylate Lvl: <7 mg/dL (ref 2.8–30.0)

## 2017-12-16 LAB — ETHANOL: Alcohol, Ethyl (B): <10 mg/dL (ref <10)

## 2017-12-16 MED ORDER — LORAZEPAM 2 MG/ML IJ SOLN: 2.0000 mg | INTRAMUSCULAR | Status: DC | PRN

## 2017-12-16 MED ORDER — DIPHENHYDRAMINE HCL 50 MG/ML IJ SOLN
50.0000 mg | Freq: Once | INTRAMUSCULAR | Status: AC
  Administered 2017-12-16: 50 mg via INTRAMUSCULAR
  Filled 2017-12-16: qty 1

## 2017-12-16 MED ORDER — TRAZODONE HCL 50 MG PO TABS: 50.0000 mg | ORAL_TABLET | Freq: Every evening | ORAL | Status: DC | PRN

## 2017-12-16 MED ORDER — LORAZEPAM 2 MG/ML IJ SOLN
2.0000 mg | Freq: Once | INTRAMUSCULAR | Status: AC
  Administered 2017-12-16: 2 mg via INTRAMUSCULAR
  Filled 2017-12-16: qty 1

## 2017-12-16 MED ORDER — IBUPROFEN 200 MG PO TABS: 600.0000 mg | ORAL_TABLET | Freq: Three times a day (TID) | ORAL | Status: DC | PRN

## 2017-12-16 MED ORDER — HALOPERIDOL LACTATE 5 MG/ML IJ SOLN
5.0000 mg | Freq: Once | INTRAMUSCULAR | Status: AC
  Administered 2017-12-16: 5 mg via INTRAMUSCULAR
  Filled 2017-12-16: qty 1

## 2017-12-16 MED ORDER — ZIPRASIDONE MESYLATE 20 MG IM SOLR: 20.0000 mg | Freq: Once | INTRAMUSCULAR | Status: DC

## 2017-12-16 MED ORDER — ONDANSETRON HCL 4 MG PO TABS: 4.0000 mg | ORAL_TABLET | Freq: Three times a day (TID) | ORAL | Status: DC | PRN

## 2017-12-16 MED ORDER — DIPHENHYDRAMINE HCL 50 MG/ML IJ SOLN: 50.0000 mg | Freq: Four times a day (QID) | INTRAMUSCULAR | Status: DC | PRN

## 2017-12-16 MED ORDER — HALOPERIDOL LACTATE 5 MG/ML IJ SOLN: 5.0000 mg | Freq: Four times a day (QID) | INTRAMUSCULAR | Status: DC | PRN

## 2017-12-16 NOTE — ED Notes (Signed)
Patient reported to unit hyper verbal, tangential and agitated. Plan of care discussed. Encouragement and support provided and safety maintain. Q 15 min safety checks in place and video monitoring.
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
## 2017-12-16 NOTE — ED Provider Notes (Addendum)
[facility]-EMERGENCY DEPT Provider Note  CSN: [id number] Arrival date & time: 12/16/17  1934  History   Chief Complaint Chief Complaint  Patient presents with  . Suicidal    IVC  . Homicidal    HPI [name] [name] is a 37 y.o. male with a psychiatric history of schizoaffective disorder bipolar type who presented to the ED under IVC for suicidal and homicidal ideation. Patient is paranoid and delusional. He talks about people being after him and others trying to take his wife and child from him. Patient's thought process is difficult to follow as he is tangential. Patient endorses AHs that instruct him to harm himself. He states that he has difficulty distinguishing reality from fiction. At one point, patient states that he wants help with AHs. He is suspicious towards law enforcement present stating they want to kill him.   Patient admits to alcohol, meth and tobacco use. He is currently not taking any psychiatric meds. Patient has no physical complaints today.  Past Medical History:  Diagnosis Date  . Anxiety   . Depression   . Mental disorder   . Schizophrenia [facility])     Patient Active Problem List   Diagnosis Date Noted  . Schizoaffective disorder, bipolar type (HCC) 05/07/2016  . Cocaine abuse (HCC) 05/07/2016  . Cannabis use disorder, moderate, in sustained remission (HCC) 04/03/2015  . Alcohol use disorder, moderate, in sustained remission (HCC) 04/03/2015    History reviewed. No pertinent surgical history.      Home Medications    Prior to Admission medications   Medication Sig Start Date End Date Taking? Authorizing Provider  divalproex (DEPAKOTE ER) 500 MG 24 hr tablet Take 1,000 mg by mouth at bedtime.    Yes [provider]  hydrOXYzine (ATARAX/VISTARIL) 25 MG tablet Take 25 mg by mouth 3 (three) times daily. 08/09/17  Yes [provider]  mirtazapine (REMERON) 15 MG tablet Take 15 mg by mouth at bedtime. 12/09/17  Yes [provider]  benztropine (COGENTIN) 1 MG tablet Take 1 mg by mouth 2 (two) times daily. 08/09/17   [provider]  fluticasone (FLONASE) 50 MCG/ACT nasal spray Place 2 sprays into both nostrils daily as needed for rhinitis.    [provider]  gabapentin (NEURONTIN) 100 MG capsule Take 2 capsules (200 mg total) by mouth 2 (two) times daily. Patient not taking: Reported on 08/19/2017 05/07/16   Charm Rings, NP  haloperidol (HALDOL) 5 MG tablet Take 5 mg by mouth at bedtime.     [provider]  ziprasidone (GEODON) 80 MG capsule Take 80 mg by mouth 3 (three) times daily.     [provider]    Family History Family History  Problem Relation Age of Onset  . Mental illness Neg Hx     Social History Social History   Tobacco Use  . Smoking status: Current Every Day Smoker    Packs/day: 1.00    Types: Cigarettes  . Smokeless tobacco: Former Engineer, water Use Topics  . Alcohol use: Yes    Comment: Once a week. "When I am around my friends"   . Drug use: Yes    Types: Marijuana, Methamphetamines    Comment: Last used: 2 weeks ago     Allergies   Patient has no known allergies.   Review of Systems Review of Systems  Constitutional: Negative.   Respiratory: Negative.   Cardiovascular: Negative.   Skin: Negative.   Neurological: Negative.  Psychiatric/Behavioral: Positive for agitation, hallucinations and suicidal ideas. The patient is nervous/anxious.      Physical Exam Updated Vital Signs BP (!) 136/110 (BP Location: Left Arm)   Pulse (!) 110   Temp 98 F (36.7 C) (Oral)   Resp 18   SpO2 97%   Physical Exam  Constitutional: He appears well-developed and well-nourished.  Eyes: Pupils are equal, round, and reactive to light. Conjunctivae, EOM and lids are normal.  Cardiovascular: Normal rate, regular rhythm and normal heart sounds.  Pulmonary/Chest: Effort normal and breath sounds normal.  Abdominal: Soft. Bowel sounds are  normal. He exhibits no distension. There is no tenderness.  Musculoskeletal: Normal range of motion.  Neurological: He is alert. He has normal strength. No cranial nerve deficit or sensory deficit. He exhibits normal muscle tone.  Skin: Skin is warm.  Psychiatric: His mood appears anxious. His affect is angry. His speech is rapid and/or pressured. He is agitated. Thought content is paranoid and delusional. He is inattentive.  Nursing note and vitals reviewed.    ED Treatments / Results  Labs (all labs ordered are listed, but only abnormal results are displayed) Labs Reviewed  COMPREHENSIVE METABOLIC PANEL - Abnormal; Notable for the following components:      Result Value   Chloride 100 (*)    Glucose, Bld 110 (*)    Total Protein 8.4 (*)    All other components within normal limits  ACETAMINOPHEN LEVEL - Abnormal; Notable for the following components:   Acetaminophen (Tylenol), Serum <10 (*)    All other components within normal limits  RAPID URINE DRUG SCREEN, HOSP PERFORMED - Abnormal; Notable for the following components:   Amphetamines POSITIVE (*)    Barbiturates   (*)    Value: Result not available. Reagent lot number recalled by manufacturer.   All other components within normal limits  ETHANOL  SALICYLATE LEVEL  CBC    EKG None  Radiology No results found.  Procedures Procedures (including critical care time)  Medications Ordered in ED Medications  LORazepam (ATIVAN) injection 2 mg (2 mg Intramuscular Given 12/16/17 2026)  diphenhydrAMINE (BENADRYL) injection 50 mg (50 mg Intramuscular Given 12/16/17 2026)  haloperidol lactate (HALDOL) injection 5 mg (5 mg Intramuscular Given 12/16/17 2024)     Initial Impression / Assessment and Plan / ED Course  Triage vital signs and the nursing notes have been reviewed.  Pertinent labs & imaging results that were available during care of the patient were reviewed and considered in medical decision making (see chart for  details).  Clinical Course as of Dec 17 2135  Thu Dec 16, 2017  2046 IM Haldol 5mg  + Benadryl 50mg  + Ativan 2mg  given for agitation.   [GM]    Clinical Course User Index [GM] Windy CarinaMortis, Gabrielle I, PA-C   Patient has no physical complaints. Patient presents acutely psychotic. While he is not responding to internal stimuli, his thought content is overtly paranoid and delusional, specifically about him and his wife's marriage and people who are out to get them. His speech is pressured and tangential and he is difficult to redirect. While in the ED, patient attempted to barracade himself in the triage room and made threats towards law enforcement. Patient received IM medications for agitation and responded appropriately. Patient meets criteria for IVC and inpatient psychiatric hospitalization. Consult has been placed to TTS for further evaluation.   Final Clinical Impressions(s) / ED Diagnoses  1. Schizoaffective Disorder, Bipolar Type. Defer treatment to psychiatric providers. Patient would benefit  from antipsychotic + mood stabilizer.  Dispo: Admit for inpatient psychiatric hospitalization. Consult placed to TTS.  Final diagnoses:  Schizoaffective disorder, bipolar type [facility])    ED Discharge Orders    None        Reva Bores 12/16/17 2135    Reva Bores 12/16/17 2138    Maia Plan, MD 12/17/17 [phone]

## 2017-12-16 NOTE — Progress Notes (Signed)
TTS attempted to assess pt who is currently sleeping and unable to be aroused. Per Rashell,RN, pt was medicated due to agitation. Pt to be assessed once he is alert and able to participate.  Princess BruinsAquicha Emmarose Klinke, MSW, LCSW Therapeutic Triage Specialist  [phone]

## 2017-12-16 NOTE — ED Notes (Signed)
Bed: [facility] Expected date:  Expected time:  Means of arrival:  Comments: 

## 2017-12-17 ENCOUNTER — Other Ambulatory Visit: Payer: Self-pay

## 2017-12-17 ENCOUNTER — Inpatient Hospital Stay (HOSPITAL_COMMUNITY)
Admission: AD | Admit: 2017-12-17 | Discharge: 2017-12-23 | DRG: 885 | Disposition: A | Discharge status: Home / Self Care | Payer: Medicare Other | Source: Intra-hospital | Attending: Psychiatry | Admitting: Psychiatry

## 2017-12-17 ENCOUNTER — Encounter (HOSPITAL_COMMUNITY): Payer: Self-pay | Admitting: *Deleted

## 2017-12-17 DIAGNOSIS — R45851 Suicidal ideations: Secondary | ICD-10-CM | POA: Diagnosis present

## 2017-12-17 DIAGNOSIS — F200 Paranoid schizophrenia: Principal | ICD-10-CM | POA: Diagnosis present

## 2017-12-17 DIAGNOSIS — Z79899 Other long term (current) drug therapy: Secondary | ICD-10-CM | POA: Diagnosis not present

## 2017-12-17 DIAGNOSIS — G4700 Insomnia, unspecified: Secondary | ICD-10-CM | POA: Diagnosis not present

## 2017-12-17 DIAGNOSIS — F1510 Other stimulant abuse, uncomplicated: Secondary | ICD-10-CM | POA: Diagnosis not present

## 2017-12-17 DIAGNOSIS — F17210 Nicotine dependence, cigarettes, uncomplicated: Secondary | ICD-10-CM | POA: Diagnosis present

## 2017-12-17 DIAGNOSIS — F15250 Other stimulant dependence with stimulant-induced psychotic disorder with delusions: Secondary | ICD-10-CM | POA: Diagnosis not present

## 2017-12-17 MED ORDER — HALOPERIDOL LACTATE 5 MG/ML IJ SOLN: 5.0000 mg | Freq: Four times a day (QID) | INTRAMUSCULAR | Status: DC | PRN

## 2017-12-17 MED ORDER — OLANZAPINE 5 MG PO TBDP
5.0000 mg | ORAL_TABLET | Freq: Two times a day (BID) | ORAL | Status: DC
  Administered 2017-12-17 – 2017-12-19 (×4): 5 mg via ORAL
  Filled 2017-12-17 (×7): qty 1

## 2017-12-17 MED ORDER — HYDROXYZINE HCL 25 MG PO TABS
25.0000 mg | ORAL_TABLET | Freq: Three times a day (TID) | ORAL | Status: DC | PRN
  Administered 2017-12-18 – 2017-12-19 (×3): 25 mg via ORAL
  Filled 2017-12-17 (×3): qty 1

## 2017-12-17 MED ORDER — OLANZAPINE 5 MG PO TBDP
5.0000 mg | ORAL_TABLET | Freq: Two times a day (BID) | ORAL | Status: DC
  Administered 2017-12-17: 5 mg via ORAL
  Filled 2017-12-17: qty 1

## 2017-12-17 MED ORDER — TRAZODONE HCL 50 MG PO TABS: 50.0000 mg | ORAL_TABLET | Freq: Every evening | ORAL | Status: DC | PRN

## 2017-12-17 MED ORDER — ACETAMINOPHEN 325 MG PO TABS
650.0000 mg | ORAL_TABLET | Freq: Four times a day (QID) | ORAL | Status: DC | PRN
  Administered 2017-12-17: 650 mg via ORAL
  Filled 2017-12-17: qty 2

## 2017-12-17 MED ORDER — MAGNESIUM HYDROXIDE 400 MG/5ML PO SUSP: 30.0000 mL | Freq: Every day | ORAL | Status: DC | PRN

## 2017-12-17 MED ORDER — DIPHENHYDRAMINE HCL 50 MG/ML IJ SOLN: 50.0000 mg | Freq: Four times a day (QID) | INTRAMUSCULAR | Status: DC | PRN

## 2017-12-17 MED ORDER — ALUM & MAG HYDROXIDE-SIMETH 200-200-20 MG/5ML PO SUSP: 30.0000 mL | ORAL | Status: DC | PRN

## 2017-12-17 MED ORDER — LORAZEPAM 2 MG/ML IJ SOLN: 2.0000 mg | INTRAMUSCULAR | Status: DC | PRN

## 2017-12-17 MED ORDER — IBUPROFEN 600 MG PO TABS
600.0000 mg | ORAL_TABLET | Freq: Three times a day (TID) | ORAL | Status: DC | PRN
  Administered 2017-12-18 – 2017-12-20 (×4): 600 mg via ORAL
  Filled 2017-12-17 (×4): qty 1

## 2017-12-17 MED ORDER — ONDANSETRON HCL 4 MG PO TABS: 4.0000 mg | ORAL_TABLET | Freq: Three times a day (TID) | ORAL | Status: DC | PRN

## 2017-12-17 MED ORDER — TRAZODONE HCL 50 MG PO TABS
50.0000 mg | ORAL_TABLET | Freq: Every evening | ORAL | Status: DC | PRN
  Administered 2017-12-17 – 2017-12-22 (×6): 50 mg via ORAL
  Filled 2017-12-17 (×6): qty 1

## 2017-12-17 NOTE — Progress Notes (Signed)
Patient ID: [name] ItoSimon [name], male   DOB: 1981-05-06, 37 y.o.   MRN: [id number] PER STATE REGULATIONS 482.30  THIS CHART WAS REVIEWED FOR MEDICAL NECESSITY WITH RESPECT TO THE PATIENT'S ADMISSION/DURATION OF STAY.  NEXT REVIEW DATE: 12/21/17  Loura HaltBARBARA Tarshia Kot, RN, BSN CASE MANAGER

## 2017-12-17 NOTE — Progress Notes (Deleted)
Patient ID: [name] ItoSimon [name], male   DOB: [birth year], 37 y.o.   MRN: [id number] PER STATE REGULATIONS 482.30  THIS CHART WAS REVIEWED FOR MEDICAL NECESSITY WITH RESPECT TO THE PATIENT'S ADMISSION/DURATION OF STAY.  NEXT REVIEW DATE: 12/23/17  Loura HaltBARBARA Tynell Winchell, RN, BSN CASE MANAGER

## 2017-12-17 NOTE — ED Notes (Signed)
Pt discharged safely with [employer].  Pt was in no distress.  All belongings were sent with patient.

## 2017-12-17 NOTE — ED Notes (Signed)
Pt is calm and cooperative today.  He is up in shower.  He states he has AVH but could not elaborate.  Pt denies S/I and H/I.  15 minute checks and video monitoring continue.
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
## 2017-12-17 NOTE — BH Assessment (Signed)
[facility] Assessment Progress Note Case was staffed with Beatrix ShipperNorman DO, Parks FNP who recommended patient be admitted inpatient as appropriate bed placement is investigated.

## 2017-12-17 NOTE — Tx Team (Signed)
Initial Treatment Plan 12/17/2017 5:44 PM [name] [name] Domagalski ZOX:[id number]    PATIENT STRESSORS: Marital or family conflict Substance abuse   PATIENT STRENGTHS: Average or above average intelligence General fund of knowledge Supportive family/friends   PATIENT IDENTIFIED PROBLEMS: Substance Abuse  " When I used Meth I get violent "  Poor Impulse control                 DISCHARGE CRITERIA:  Ability to meet basic life and health needs Adequate post-discharge living arrangements Improved stabilization in mood, thinking, and/or behavior  PRELIMINARY DISCHARGE PLAN: Attend aftercare/continuing care group Outpatient therapy  PATIENT/FAMILY INVOLVEMENT: This treatment plan has been presented to and reviewed with the patient, [name] [name] Stanwood, and/or family member,wife.  The patient and family have been given the opportunity to ask questions and make suggestions.  Jimmey RalphPerez, Enedina Pair M, RN 12/17/2017, 5:44 PM
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
## 2017-12-18 DIAGNOSIS — F15250 Other stimulant dependence with stimulant-induced psychotic disorder with delusions: Secondary | ICD-10-CM

## 2017-12-18 NOTE — BHH Group Notes (Signed)
  [facility]/BM[facility] LCSW Group Therapy Note  Date/Time:  12/18/2017 11:15AM-12:00PM  Type of Therapy and Topic:  Group Therapy:  Feelings About Hospitalization  Participation Level:  Active   Description of Group This process group involved patients discussing their feelings related to being hospitalized, as well as the benefits they see to being in the hospital.  These feelings and benefits were itemized.  The group then brainstormed specific ways in which they could seek those same benefits when they discharge and return home.  Therapeutic Goals 1. Patient will identify and describe positive and negative feelings related to hospitalization 2. Patient will verbalize benefits of hospitalization to themselves personally 3. Patients will brainstorm together ways they can obtain similar benefits in the outpatient setting, identify barriers to wellness and possible solutions  Summary of Patient Progress:  The patient expressed his primary feelings about being hospitalized are that he is glad to be here because "I had gotten so mad I was becoming destructive.  Something is wrong in my mind, I'm on drugs too much."  He talked at length about how he has used drugs to escape his problems and how it has made things worse.  He talked a great deal about his [name] faith as well.  He stated that while he was angry at the way his sisters will curse at him and say he needs to go to the hospital, he himself was not able to reach out for help, so he is glad that they cared enough about him to get him put here involuntarily.  Therapeutic Modalities Cognitive Behavioral Therapy Motivational Interviewing    Ambrose MantleMareida Grossman-Orr, LCSW 12/18/2017, 8:13 AM

## 2017-12-18 NOTE — Progress Notes (Signed)
Patient ID: [name] ItoSimon Valido, male   DOB: [birth year], 37 y.o.   MRN: [id number] DAR Note: Pt observed in room resting with eyes closed. Pt at the time of assessment endorsed moderate anxiety and depression, and AH. Pt also complained of paranoia; "they keep making me paranoid, telling me things they know I don't want to hear."-Pt denied command auditory hallucinations. Pt also complained of R. big toe pain-see MAR. Pt denied SI/HI. Pt was med compliant. All patient's questions and concerns addressed. Support, encouragement, and safe environment provided. 15-minute safety checks continue. Pt did not attend wrap-up group
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
## 2017-12-18 NOTE — H&P (Addendum)
Psychiatric Admission Assessment Adult  Patient Identification: [name] [name] MRN:  [id number] Date of Evaluation:  12/18/2017 Chief Complaint:  SCHIZOPHRENIA Principal Diagnosis: <principal problem not specified> Diagnosis:   Patient Active Problem List   Diagnosis Date Noted  . Schizophrenia, paranoid type ([place]) [F20.0] 12/17/2017  . Schizoaffective disorder, bipolar type ([place]) [F25.0] 05/07/2016  . Cocaine abuse ([place]) [F14.10] 05/07/2016  . Cannabis use disorder, moderate, in sustained remission ([place]) [F12.21] 04/03/2015  . Alcohol use disorder, moderate, in sustained remission ([place]) [F10.21] 04/03/2015   History of Present Illness: Per assessment note- [name] [name] is an 37 y.o. male that presents this date with IVC. Per IVC: "[name] diagnosed with Schizophrenia and Psychotic disorder. Currently drinking alcohol and abusing methamphetamine and at this moment has a knife in his hand and sending a threatening text to family. His wife is hiding in the home with the child from the respondent and is throwing all the clothing in the middle of the home. [name] stating he wants to kill himself and will attempt to kill police if contacted." Patient is observed to be very agitated and arguing with this writer who is attempting to conduct assessment. Patient does state he wants to harm himself but will not verbalize a plan or intent. Patient is difficult to redirect and refuses to participate in the assessment. Patient speaks in a loud threatening voice pointing at this Probation officer and is calling this Probation officer "a white cracker." Patient's behavior escalates and patient began to approach this Probation officer as patient continues to make verbal threats. This Probation officer terminated the interview and completed the assessment from admission notes and prior history. Per notes, patient has a history of  schizoaffective disorder bipolar typewho presented to the ED under IVCfor suicidal and homicidal ideation.Patient is paranoid  and delusional. He talks about people being after him and others trying to take his wife and child from him. Patient's thought processis difficult to follow as he is tangential. Patient endorses AH that instruct him to harm himself. He states that he has difficulty distinguishing reality from fiction. He is suspicious towards law enforcement present stating they want to kill him.Patient admits to alcohol, methamphetamine and tobacco use. He is currently not taking any psychiatric medication. Per record review patient has been receiving services from [facility].  Evaluation: Patient seen sitting in day room responding to internal stimuli.  Continues to present bizarre paranoid and delusional.  Ongoing reports about his wife cheating states she is going to kill everybody.  Throughout the treatment patient has lucidness patient is tends gentle however redirectable.  Reports auditory and visual hallucinations that are intermittent patient has concerns about left toe pain states he was hiding from the police and is unsure how his toe was injured.  Offered ibuprofen for pain patient was agreeable to treatment. support encouragement and reassurance was provided.    Associated Signs/Symptoms: Depression Symptoms:  feelings of worthlessness/guilt, difficulty concentrating, suicidal thoughts with specific plan, (Hypo) Manic Symptoms:  Distractibility, Irritable Mood, Labiality of Mood, Anxiety Symptoms:  Excessive Worry, Psychotic Symptoms:  Hallucinations: Auditory PTSD Symptoms: NA Total Time spent with patient: 20 minutes  Past Psychiatric History: Reports he is followed by mom received a psychiatrist and a therapist.  Reports previous inpatient admissions unable to recall the date.  Chart review prior medications listed Depakote Neurontin Remeron and Geodon  Is the patient at risk to self? Yes.    Has the patient been a risk to self in the past 6 months? Yes.    Has the  patient been a risk to self  within the distant past? No.  Is the patient a risk to others? No.  Has the patient been a risk to others in the past 6 months? No.  Has the patient been a risk to others within the distant past? No.   Prior Inpatient Therapy:   Prior Outpatient Therapy:    Alcohol Screening: 1. How often do you have a drink containing alcohol?: Never 2. How many drinks containing alcohol do you have on a typical day when you are drinking?: 1 or 2 3. How often do you have six or more drinks on one occasion?: Never AUDIT-C Score: 0 9. Have you or someone else been injured as a result of your drinking?: No 10. Has a relative or friend or a doctor or another health worker been concerned about your drinking or suggested you cut down?: No Alcohol Use Disorder Identification Test Final Score (AUDIT): 0 Intervention/Follow-up: AUDIT Score <7 follow-up not indicated Substance Abuse History in the last 12 months:  Yes.   Consequences of Substance Abuse: NA Previous Psychotropic Medications: Yes  Psychological Evaluations: Yes  Past Medical History:  Past Medical History:  Diagnosis Date  . Anxiety   . Depression   . Mental disorder   . Schizophrenia ([place])    History reviewed. No pertinent surgical history. Family History:  Family History  Problem Relation Age of Onset  . Mental illness Neg Hx    Family Psychiatric  History:  Tobacco Screening: Have you used any form of tobacco in the last 30 days? (Cigarettes, Smokeless Tobacco, Cigars, and/or Pipes): Yes Tobacco use, Select all that apply: 5 or more cigarettes per day Are you interested in Tobacco Cessation Medications?: Yes, will notify MD for an order Counseled patient on smoking cessation including recognizing danger situations, developing coping skills and basic information about quitting provided: Refused/Declined practical counseling Social History:  Social History   Substance and Sexual Activity  Alcohol Use Not Currently   Comment: haven't  drank in months"     Social History   Substance and Sexual Activity  Drug Use Yes  . Types: Marijuana, Methamphetamines   Comment: Last used: 2 weeks ago    Additional Social History:                           Allergies:  No Known Allergies Lab Results:  Results for orders placed or performed during the hospital encounter of 12/16/17 (from the past 48 hour(s))  Comprehensive metabolic panel     Status: Abnormal   Collection Time: 12/16/17  8:15 PM  Result Value Ref Range   Sodium 135 135 - 145 mmol/L   Potassium 3.6 3.5 - 5.1 mmol/L   Chloride 100 (L) 101 - 111 mmol/L   CO2 25 22 - 32 mmol/L   Glucose, Bld 110 (H) 65 - 99 mg/dL   BUN 10 6 - 20 mg/dL   Creatinine, Ser 1.03 0.61 - 1.24 mg/dL   Calcium 9.5 8.9 - 10.3 mg/dL   Total Protein 8.4 (H) 6.5 - 8.1 g/dL   Albumin 4.8 3.5 - 5.0 g/dL   AST 35 15 - 41 U/L   ALT 51 17 - 63 U/L   Alkaline Phosphatase 44 38 - 126 U/L   Total Bilirubin 0.7 0.3 - 1.2 mg/dL   GFR calc non Af Amer >60 >60 mL/min   GFR calc Af Amer >60 >60 mL/min    Comment: (NOTE)  The eGFR has been calculated using the CKD EPI equation. This calculation has not been validated in all clinical situations. eGFR's persistently <60 mL/min signify possible Chronic Kidney Disease.    Anion gap 10 5 - 15    Comment: Performed at [facility], [place] [address] [place], [place] [address]  Ethanol     Status: None   Collection Time: 12/16/17  8:15 PM  Result Value Ref Range   Alcohol, Ethyl (B) <10 <10 mg/dL    Comment: (NOTE) Lowest detectable limit for serum alcohol is 10 mg/dL. For medical purposes only. Performed at [facility], [place] [address] [place], [place] [address]   Salicylate level     Status: None   Collection Time: 12/16/17  8:15 PM  Result Value Ref Range   Salicylate Lvl <3.5 2.8 - 30.0 mg/dL    Comment: Performed at [facility], [place] [address], [place], [place] [address]   Acetaminophen level     Status: Abnormal   Collection Time: 12/16/17  8:15 PM  Result Value Ref Range   Acetaminophen (Tylenol), Serum <10 (L) 10 - 30 ug/mL    Comment: (NOTE) Therapeutic concentrations vary significantly. A range of 10-30 ug/mL  may be an effective concentration for many patients. However, some  are best treated at concentrations outside of this range. Acetaminophen concentrations >150 ug/mL at 4 hours after ingestion  and >50 ug/mL at 12 hours after ingestion are often associated with  toxic reactions. Performed at [facility], [place] [address], [place], [place] [address]   cbc     Status: None   Collection Time: 12/16/17  8:15 PM  Result Value Ref Range   WBC 7.6 4.0 - 10.5 K/uL   RBC 5.28 4.22 - 5.81 MIL/uL   Hemoglobin 14.2 13.0 - 17.0 g/dL   HCT 42.8 39.0 - 52.0 %   MCV 81.1 78.0 - 100.0 fL   MCH 26.9 26.0 - 34.0 pg   MCHC 33.2 30.0 - 36.0 g/dL   RDW 13.7 11.5 - 15.5 %   Platelets 219 150 - 400 K/uL    Comment: Performed at [facility], [place] [address] [place], [place] [address]  Rapid urine drug screen (hospital performed)     Status: Abnormal   Collection Time: 12/16/17  8:29 PM  Result Value Ref Range   Opiates NONE DETECTED NONE DETECTED   Cocaine NONE DETECTED NONE DETECTED   Benzodiazepines NONE DETECTED NONE DETECTED   Amphetamines POSITIVE (A) NONE DETECTED   Tetrahydrocannabinol NONE DETECTED NONE DETECTED   Barbiturates (A) NONE DETECTED    Result not available. Reagent lot number recalled by manufacturer.    Comment: Performed at [facility], [place] [address], [place], [place] [address]    Blood Alcohol level:  Lab Results  Component Value Date   [facility] <10 12/16/2017   ETH <10 89/[birth year]1941    Metabolic Disorder Labs:  Lab Results  Component Value Date   HGBA1C 6.1 (H) 04/02/2015   MPG 128 04/02/2015   Lab Results  Component Value Date   PROLACTIN 7.0 04/04/2015   Lab  Results  Component Value Date   CHOL 167 04/04/2015   TRIG 120 04/04/2015   HDL 47 04/04/2015   CHOLHDL 3.6 04/04/2015   VLDL 24 04/04/2015   LDLCALC 96 04/04/2015    Current Medications: Current Facility-Administered Medications  Medication Dose Route Frequency Provider Last Rate Last Dose  . acetaminophen (TYLENOL) tablet  650 mg  650 mg Oral Q6H PRN Ethelene Hal, NP   650 mg at 12/17/17 2117  . alum & mag hydroxide-simeth (MAALOX/MYLANTA) 200-200-20 MG/5ML suspension 30 mL  30 mL Oral Q4H PRN Ethelene Hal, NP      . diphenhydrAMINE (BENADRYL) injection 50 mg  50 mg Intramuscular Q6H PRN Ethelene Hal, NP      . haloperidol lactate (HALDOL) injection 5 mg  5 mg Intramuscular Q6H PRN Ethelene Hal, NP      . hydrOXYzine (ATARAX/VISTARIL) tablet 25 mg  25 mg Oral TID PRN Ethelene Hal, NP      . ibuprofen (ADVIL,MOTRIN) tablet 600 mg  600 mg Oral Q8H PRN Ethelene Hal, NP   600 mg at 12/18/17 0931  . LORazepam (ATIVAN) injection 2 mg  2 mg Intramuscular Q4H PRN Ethelene Hal, NP      . magnesium hydroxide (MILK OF MAGNESIA) suspension 30 mL  30 mL Oral Daily PRN Ethelene Hal, NP      . OLANZapine zydis (ZYPREXA) disintegrating tablet 5 mg  5 mg Oral BID Ethelene Hal, NP   5 mg at 12/18/17 0817  . ondansetron (ZOFRAN) tablet 4 mg  4 mg Oral Q8H PRN Ethelene Hal, NP      . traZODone (DESYREL) tablet 50 mg  50 mg Oral QHS PRN Ethelene Hal, NP   50 mg at 12/17/17 2117   PTA Medications: Medications Prior to Admission  Medication Sig Dispense Refill Last Dose  . divalproex (DEPAKOTE ER) 500 MG 24 hr tablet Take 1,000 mg by mouth at bedtime.    Past Week at Unknown time  . gabapentin (NEURONTIN) 100 MG capsule Take 2 capsules (200 mg total) by mouth 2 (two) times daily. (Patient not taking: Reported on 08/19/2017) 120 capsule 1 Not Taking at Unknown time  . hydrOXYzine (ATARAX/VISTARIL) 25 MG tablet  Take 25 mg by mouth 3 (three) times daily.  0 Past Week at Unknown time  . mirtazapine (REMERON) 15 MG tablet Take 15 mg by mouth at bedtime.   Past Week at Unknown time  . ziprasidone (GEODON) 80 MG capsule Take 80 mg by mouth 2 (two) times daily with a meal.    Past Week at Unknown time    Musculoskeletal: Strength & Muscle Tone: within normal limits Gait & Station: normal Patient leans: N/A  Psychiatric Specialty Exam: Physical Exam  Vitals reviewed. Constitutional: He appears well-developed.  Cardiovascular: Normal rate.  Neurological: He is alert.  Psychiatric: He has a normal mood and affect. His behavior is normal.    Review of Systems  Musculoskeletal:       Toe pain-rightside  Psychiatric/Behavioral: Positive for hallucinations.  All other systems reviewed and are negative.   Blood pressure 124/77, pulse 82, temperature 97.7 F (36.5 C), temperature source Oral, resp. rate 18, height 5' 3" (1.6 m), weight 77.1 kg (169 lb 15.6 oz), SpO2 98 %.Body mass index is 30.11 kg/m.  General Appearance: Casual and Guarded  Eye Contact:  Good  Speech:  Clear and Coherent  Volume:  Normal  Mood:  Anxious, Depressed and Dysphoric  Affect:  Congruent  Thought Process:  Disorganized and Descriptions of Associations: Tangential  Orientation:  Full (Time, Place, and Person)  Thought Content:  Hallucinations: Auditory  Suicidal Thoughts:  No  Homicidal Thoughts:  Yes.  without intent/plan  Memory:  Immediate;   Good Recent;   Fair Remote;   Fair  Judgement:  Fair  Insight:  Lacking  Psychomotor Activity:  Normal  Concentration:  Concentration: Fair  Recall:  [employer] of Knowledge:  Fair  Language:  Fair  Akathisia:  No  Handed:  Right  AIMS (if indicated):     Assets:  Communication Skills Desire for Improvement Physical Health Social Support  ADL's:  Intact  Cognition:  WNL  Sleep:  Number of Hours: 6.75    Treatment Plan Summary: Daily contact with patient to  assess and evaluate symptoms and progress in treatment and Medication management   - see sra by attending psychiatrist notes for medications adjustment   Observation Level/Precautions:  15 minute checks  Laboratory:  Chemistry Profile  Psychotherapy:  Individual and group session  Medications:  See SRA by MD  Consultations:  csw and psychiatry   Discharge Concerns:  Safety, stabilization, and risk of access to medication and medication stabilization   Estimated LOS: 5-7 Days   Other:     Physician Treatment Plan for Primary Diagnosis: <principal problem not specified> Long Term Goal(s): Improvement in symptoms so as ready for discharge  Short Term Goals: Ability to identify changes in lifestyle to reduce recurrence of condition will improve, Ability to verbalize feelings will improve and Ability to identify and develop effective coping behaviors will improve  Physician Treatment Plan for Secondary Diagnosis: Active Problems:   Schizophrenia, paranoid type ([place])  Long Term Goal(s): Improvement in symptoms so as ready for discharge  Short Term Goals: Ability to disclose and discuss suicidal ideas, Ability to demonstrate self-control will improve, Ability to identify and develop effective coping behaviors will improve and Ability to identify triggers associated with substance abuse/mental health issues will improve  I certify that inpatient services furnished can reasonably be expected to improve the patient's condition.    Derrill Center, NP 6/15/201910:32 AM   I have discussed case with NP and have met with patient  Agree with NP note and assessment  Patient is a 37 year old male, married, has 1 child, reports she is employed at [employer]. He presented to the hospital on June 13 under IVC, reporting he has history of schizophrenia, has been abusing alcohol and stimulant drugs, and was making threats via text to family, reporting suicidal ideations, presenting with disorganized  behavior. In patient presented ED agitated, argumentative, verbally aggressive, making threats,  expressing paranoid ideations of people being after him and law enforcement wanting to kill.  Today presents calmer, superficially cooperative, but still presenting guarded, suspicious. He is a fair historian, states he is here because "the police hate I am using drugs".  Endorses methamphetamine abuse but states he was using irregularly.  Today denies recent alcohol consumption.  Of note June 13 admission BAL negative for alcohol, UDS positive for amphetamines. Patient has a history of prior psychiatric admission to [facility] in 2016, for symptoms of psychosis.  At the time was discharged on Abilify and Celexa.  He has been diagnosed with schizophrenia in the past. He states that more recently he has been prescribed Geodon, Depakote, Remeron.  States he takes them often, but it is not currently clear how compliant he has been.  Denies side effects. Denies medical illnesses, not allergic to any medications, smokes a pack of cigarettes per day *He is not currently presenting with symptoms of alcohol withdrawal, vitals are stable, June 13 blood alcohol level less than 10. 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 He has been started on Zyprexa 5  mg twice daily ( on 6/14)  no side effects thus far. States he feels Depakote has been helpful, will check serum level prior to restarting. Haldol, Ativan PRN's for agitation if needed

## 2017-12-18 NOTE — Progress Notes (Signed)
Adult Psychoeducational Group Note  Date:  12/18/2017 Time:  9:13 PM  Group Topic/Focus:  Wrap-Up Group:   The focus of this group is to help patients review their daily goal of treatment and discuss progress on daily workbooks.  Participation Level:  Active  Participation Quality:  Appropriate  Affect:  Appropriate  Cognitive:  Appropriate  Insight: Appropriate  Engagement in Group:  Engaged  Modes of Intervention:  Discussion  Additional Comments:  The patient expressed that he rates today a 7.The patient also said that he attended group.  Octavio Mannshigpen, Kamdyn Colborn Lee 12/18/2017, 9:13 PM

## 2017-12-18 NOTE — Progress Notes (Addendum)
Patient ID: [name] [name] Scadden, male   DOB: 06-14-1981, 37 y.o.   MRN: [id number]    D: Pt has been very disorganized on the unit today, and and required frequent redirection. Pt has been very focused on toe pain, hole in chest, and seeing his daughter. Pt did attend group with minimal participation. Pt took all medications as prescibed by the doctor. Pt reported that his depression was a 0, his hopelessness was a 0, and her anxiety was a . Pt reported that [facility] Pt reported being negative SI/HI, no AH/VH noted. A: 15 min checks continued for patient safety. R: Pt safety maintained.

## 2017-12-18 NOTE — [facility] Suicide Risk Assessment (Signed)
[facility]   Nursing information obtained from:  Patient Demographic factors:  Male, NA Current Mental Status:  Suicidal ideation indicated by others, Self-harm behaviors, Plan to harm others Loss Factors:  NA Historical Factors:  Impulsivity Risk Reduction Factors:  Responsible for children under 36 years of age, Sense of responsibility to family, Religious beliefs about death, Living with another person, especially a relative, Positive therapeutic relationship  Total Time spent with patient: 45 minutes Principal Problem: <principal problem not specified> Diagnosis:   Patient Active Problem List   Diagnosis Date Noted  . Schizophrenia, paranoid type (HCC) [F20.0] 12/17/2017  . Schizoaffective disorder, bipolar type (HCC) [F25.0] 05/07/2016  . Cocaine abuse (HCC) [F14.10] 05/07/2016  . Cannabis use disorder, moderate, in sustained remission (HCC) [F12.21] 04/03/2015  . Alcohol use disorder, moderate, in sustained remission (HCC) [F10.21] 04/03/2015   Subjective Data:   Continued Clinical Symptoms:  Alcohol Use Disorder Identification Test Final Score (AUDIT): 0 The "Alcohol Use Disorders Identification Test", Guidelines for Use in Primary Care, Second Edition.  World Science writerHealth Organization [facility](WHO). Score between 0-7:  no or low risk or alcohol related problems. Score between 8-15:  moderate risk of alcohol related problems. Score between 16-19:  high risk of alcohol related problems. Score 20 or above:  warrants further diagnostic evaluation for alcohol dependence and treatment.   CLINICAL FACTORS:  Patient is a 37 year old male, married, has 1 child, reports she is employed at [employer]. He presented to the hospital on June 13 under IVC, reporting he has history of schizophrenia, has been abusing alcohol and stimulant drugs, and was making threats via text to family, reporting suicidal ideations, presenting with disorganized behavior. In patient presented  ED agitated, argumentative, verbally aggressive, making threats,  expressing paranoid ideations of people being after him and law enforcement wanting to kill.  Today presents calmer, superficially cooperative, but still presenting guarded, suspicious. He is a fair historian, states he is here because "the police hate I am using drugs".  Endorses methamphetamine abuse but states he was using irregularly.  Today denies recent alcohol consumption.  Of note June 13 admission BAL negative for alcohol, UDS positive for amphetamines. Patient has a [facility], for symptoms of psychosis.  At the time was discharged on Abilify and Celexa.  He has been diagnosed with schizophrenia in the past. He states that more recently he has been prescribed Geodon, Depakote, Remeron.  States he takes them often, but it is not currently clear how compliant he has been.  Denies side effects. Denies medical illnesses, not allergic to any medications, smokes a pack of cigarettes per day *He is not currently presenting with symptoms of alcohol withdrawal, vitals are stable, June 13 blood alcohol level less than 10. 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 He has been started on Zyprexa 5 mg twice daily ( on 6/14)  no side effects thus far. States he feels Depakote has been helpful, will check serum level prior to restarting. Haldol, Ativan PRN's for agitation if needed         Musculoskeletal: Strength & Muscle Tone: within normal limits Gait & Station: normal Patient leans: N/A  Psychiatric Specialty Exam: Physical Exam  ROS denies chest pain, no shortness of breath, no vomiting  Blood pressure 124/77, pulse 82, temperature 97.7 F (36.5 C), temperature source Oral, resp. rate 18, height 5\' 3"  (1.6 m), weight 77.1 kg (169 lb 15.6  oz), SpO2 98 %.Body mass index is 30.11 kg/m.  General  Appearance: Fairly Groomed  Eye Contact:  Fair  Speech:  Normal Rate  Volume:  Decreased  Mood:  states mood is " ok"  Affect:  blunted   Thought Process:  Disorganized and Descriptions of Associations: Tangential, becomes tangential  Orientation:  Other:  fully alert and attentive   Thought Content:  denies hallucinations at this time, does not express delusions at this time, appears distracted, internally preoccupied at times during session  Suicidal Thoughts:  No currently denies suicidal or self injurious ideations, denies homicidal or violent ideations  Homicidal Thoughts:  No  Memory:  recent and remote fair   Judgement:  Impaired  Insight:  Lacking  Psychomotor Activity:  Normal- no tremors, no psychometer agitation at this time  Concentration:  Concentration: Fair and Attention Span: Fair  Recall:  [employer] of Knowledge:  Fair  Language:  Fair  Akathisia:  Negative  Handed:  Right  AIMS (if indicated):     Assets:  Desire for Improvement  ADL's:  Intact  Cognition:  WNL  Sleep:  Number of Hours: 6.75      COGNITIVE FEATURES THAT CONTRIBUTE TO RISK:  Closed-mindedness and Loss of executive function    SUICIDE RISK:   Moderate:  Frequent suicidal ideation with limited intensity, and duration, some specificity in terms of plans, no associated intent, good self-control, limited dysphoria/symptomatology, some risk factors present, and identifiable protective factors, including available and accessible social support.  PLAN OF CARE: Patient will be admitted to inpatient psychiatric unit for stabilization and safety. Will provide and encourage milieu participation. Provide medication management and maked adjustments as needed.  Will follow daily.    I certify that inpatient services furnished can reasonably be expected to improve the patient's condition.   Craige Cotta, MD 12/18/2017, 2:10 PM

## 2017-12-19 DIAGNOSIS — F200 Paranoid schizophrenia: Principal | ICD-10-CM

## 2017-12-19 DIAGNOSIS — F1510 Other stimulant abuse, uncomplicated: Secondary | ICD-10-CM

## 2017-12-19 DIAGNOSIS — Z79899 Other long term (current) drug therapy: Secondary | ICD-10-CM

## 2017-12-19 DIAGNOSIS — F17210 Nicotine dependence, cigarettes, uncomplicated: Secondary | ICD-10-CM

## 2017-12-19 DIAGNOSIS — G4700 Insomnia, unspecified: Secondary | ICD-10-CM

## 2017-12-19 LAB — LIPID PANEL
CHOL/HDL RATIO: 6.1 ratio
CHOLESTEROL: 182 mg/dL (ref 0–200)
HDL: 30 mg/dL — AB (ref >40)
LDL Cholesterol: 111 mg/dL — ABNORMAL HIGH (ref 0–99)
TRIGLYCERIDES: 204 mg/dL — AB (ref <150)
VLDL: 41 mg/dL — ABNORMAL HIGH (ref 0–40)

## 2017-12-19 LAB — HEMOGLOBIN A1C
Hgb A1c MFr Bld: 5.7 % — ABNORMAL HIGH (ref 4.8–5.6)
Mean Plasma Glucose: 116.89 mg/dL

## 2017-12-19 LAB — TSH: TSH: 2.177 u[IU]/mL (ref 0.350–4.500)

## 2017-12-19 MED ORDER — OLANZAPINE 5 MG PO TBDP: 5.0000 mg | ORAL_TABLET | Freq: Every day | ORAL | Status: DC

## 2017-12-19 MED ORDER — OLANZAPINE 10 MG PO TABS
10.0000 mg | ORAL_TABLET | Freq: Every day | ORAL | Status: DC
  Administered 2017-12-19: 10 mg via ORAL
  Filled 2017-12-19 (×4): qty 1

## 2017-12-19 MED ORDER — OLANZAPINE 10 MG PO TBDP: 10.0000 mg | ORAL_TABLET | Freq: Every day | ORAL | Status: DC

## 2017-12-19 MED ORDER — OLANZAPINE 5 MG PO TABS
5.0000 mg | ORAL_TABLET | Freq: Every day | ORAL | Status: DC
  Administered 2017-12-20: 5 mg via ORAL
  Filled 2017-12-19 (×4): qty 1

## 2017-12-19 MED ORDER — NICOTINE 21 MG/24HR TD PT24
21.0000 mg | MEDICATED_PATCH | Freq: Every day | TRANSDERMAL | Status: DC
  Administered 2017-12-19 – 2017-12-21 (×2): 21 mg via TRANSDERMAL
  Filled 2017-12-19 (×7): qty 1

## 2017-12-19 NOTE — Plan of Care (Signed)
Patient's mood is still labile, but he is participating in plan of care. He expresses strong interest in substance cessation and hopes to build healthier relationships. He is interested in returning home and wants to know what can help him get discharged sooner. RN provided education on such.

## 2017-12-19 NOTE — Progress Notes (Signed)
D: Patient presents hyper-religious, with pressured speech, anxious, paranoid, irritable at times. During medication pass, patient was assessed and became agitated when asked questions about how he was feeling. He later shared in group that he is still hearing voices telling him to do things. He did not divulge what those things were. He denies SI/HI and VH. Still complaining of pain to his right greater toe, which appears bruised. He states that prior to admission he was "hearing stuff and people were honking horns" outside his home, and this upset him. He stated he later got into an altercation with the police. He reported that his sleep was good last night, and he had no difficulty falling asleep. His main concern is substance abuse, and he wants to build healthier relationships outside of here.  A: Patient checked q15 min, and checks reviewed. Reviewed medication changes with patient and educated on side effects. Educated patient on importance of attending group therapy sessions and educated on several coping skills. Encouarged participation in milieu through recreation therapy and attending meals with peers.  R: Patient receptive to education on medications, and is medication compliant. Patient attending all group therapy sessions, recreation time and cafeteria with peers. Patient contracts for safety on the unit.

## 2017-12-19 NOTE — Plan of Care (Signed)
Pt continues to progress towards goals and d/c. RN will continue to monitor.  

## 2017-12-19 NOTE — [facility] Group Notes (Signed)
[facility] Group Notes:  (Nursing/MHT/Case Management/Adjunct)  Date:  12/19/2017  Time:  3:52 PM  Type of Therapy:  Psychoeducational Skills  Participation Level:  Active  Participation Quality:  Monopolizing and Sharing  Affect:  Excited  Cognitive:  Alert and Oriented  Insight:  Improving  Engagement in Group:  Monopolizing and Off Topic  Modes of Intervention:  Discussion and Education  Summary of Progress/Problems:  In this group we discussed health boundaries. We defined what porous, rigid, and healthy boundaries looked like when applied to our lives. We also discussed the different types of boundaries. RN encouraged them to complete the worksheet at the end of the lesson on their own. Patient was off topic and monopolized discussion. He was asking questions about how to be obedient to God.   Kirstie MirzaJonathan C Deanne [name] 12/19/2017, 3:52 PM

## 2017-12-19 NOTE — Progress Notes (Signed)
Pt on unit in room.  Pt sts that he needs to leave because he does not feel right being here.  Pt asks for meds but unable to articulate why he wants them  Pt attends group, has snack and returns to room to sleep Pt did deny SI HI and AVH  Pt contracts for safety. Pt denies any needs at this time.   Pt remains safe on unit.

## 2017-12-19 NOTE — [facility] Group Notes (Signed)
[facility]  Date/Time:  12/19/2017  11:00AM-12:00PM  Type of Therapy and Topic:  Group Therapy:  Music and Mood  Participation Level:  Active   Description of Group: In this process group, members listened to a variety of genres of music and identified that different types of music evoke different responses.  Patients were encouraged to identify music that was soothing for them and music that was energizing for them.  Patients discussed how this knowledge can help with wellness and recovery in various ways including managing depression and anxiety as well as encouraging healthy sleep habits.    Therapeutic Goals: 1. Patients will explore the impact of different varieties of music on mood 2. Patients will verbalize the thoughts they have when listening to different types of music 3. Patients will identify music that is soothing to them as well as music that is energizing to them 4. Patients will discuss how to use this knowledge to assist in maintaining wellness and recovery 5. Patients will explore the use of music as a coping skill  Summary of Patient Progress:  At the beginning of group, patient expressed that he felt depressed, rating this a 6 out of 10, and anxious, rating this a 7 out of 10.  He was in and out of the room numerous times.  He stated something was telling him to get up and leave the room.  He did respond to questions about how music selections made him feel.  At the end of group, he stated he had no depression or anxiety left, rating them both at a 0.  Therapeutic Modalities: Solution Focused Brief Therapy Activity   Ambrose MantleMareida Grossman-Orr, LCSW

## 2017-12-19 NOTE — [facility] Group Notes (Signed)
Adult Psychoeducational Group Note  Date:  12/19/2017 Time:  9:30 AM  Group Topic/Focus: Orientation/Goals Group Orientation:   The focus of this group is to educate the patient on the purpose and policies of crisis stabilization and provide a format to answer questions about their admission.  The group details unit policies and expectations of patients while admitted. Goals Group:   The focus of this group is to help patients establish daily goals to achieve during treatment and discuss how the patient can incorporate goal setting into their daily lives to aide in recovery.   Participation Level:  Poor  Participation Quality:  Minimal  Affect:  Inattentive  Cognitive:  Alert and Oriented  Insight: Limited/Lacking  Engagement in Group:  Developing/Improving  Modes of Intervention:  Discussion and Education  Additional Comments:  Patient did not contribute to discussion.   Marchelle Folksmanda A Imonie Tuch 12/19/2017, 10:00 AM

## 2017-12-19 NOTE — Progress Notes (Addendum)
[facility] MD Progress Note  12/19/2017 10:32 AM [name] [name]  MRN:  [id number]   Subjective:  Siomon awake alert oriented.  Presents guarded and disorganized.  Has ongoing paranoid ideations.  Continues to express concerns about right-sided toe pain however observed throughout the milieu walking with a steady gate. Consider xray.  Patient continues to express concerns about killing the person that is sleeping with his wife however he is unable to substantiate claims.  Patient continues to ramble about substance abuse and hiding from the police yesterday.  Reports taking medication and tolerating them well.  Patient positive for amphetamines UDS.  Per nursing staff patient is medication compliant tolerating medication well denies medication side effects.states a fair appetite. Support encouragement reassurance was provided.  History:Per assessment note- [name] [name] an 37 y.o.malethat presents this date with IVC. Per IVC: "[name] diagnosed with Schizophrenia and Psychotic disorder. Currently drinking alcohol and abusing methamphetamine and at this moment has a knife in his hand and sending a threatening text to family. His wife is hiding in the home with the child from the respondent and is throwing all the clothing in the middle of the home. [name] stating he wants to kill himself and will attempt to kill police if contacted." Patient is observed to be very agitated and arguing with this writer who is attempting to conduct assessment. Patient does state he wants to harm himself but will not verbalize a plan or intent.  Principal Problem: Schizophrenia, paranoid type (HCC) Diagnosis:   Patient Active Problem List   Diagnosis Date Noted  . 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 Schizophrenia, paranoid type (HCC) [F20.0] 12/17/2017  . Schizoaffective disorder, bipolar type (HCC) [F25.0] 05/07/2016  . Cocaine abuse (HCC) [F14.10]  05/07/2016  . Cannabis use disorder, moderate, in sustained remission (HCC) [F12.21] 04/03/2015  . Alcohol use disorder, moderate, in sustained remission (HCC) [F10.21] 04/03/2015   Total Time spent with patient: 20 minutes  Past Psychiatric History:   Past Medical History:  Past Medical History:  Diagnosis Date  . Anxiety   . Depression   . Mental disorder   . Schizophrenia (HCC)    History reviewed. No pertinent surgical history. Family History:  Family History  Problem Relation Age of Onset  . Mental illness Neg Hx    Family Psychiatric  History:  Social History:  Social History   Substance and Sexual Activity  Alcohol Use Not Currently   Comment: haven't drank in months"     Social History   Substance and Sexual Activity  Drug Use Yes  . Types: Marijuana, Methamphetamines   Comment: Last used: 2 weeks ago    Social History   Socioeconomic History  . Marital status: Single    Spouse name: Not on file  . Number of children: Not on file  . Years of education: Not on file  . Highest education level: Not on file  Occupational History  . Not on file  Social Needs  . Financial resource strain: Not on file  . Food insecurity:    Worry: Not on file    Inability: Not on file  . Transportation needs:    Medical: Not on file    Non-medical: Not on file  Tobacco Use  . Smoking status: Current Every Day Smoker    Packs/day: 1.00    Types: Cigarettes  . Smokeless tobacco: Former Engineer, water and Sexual Activity  . Alcohol use: Not Currently    Comment:  haven't drank in months"  . Drug use: Yes    Types: Marijuana, Methamphetamines    Comment: Last used: 2 weeks ago  . Sexual activity: Yes    Birth control/protection: None  Lifestyle  . Physical activity:    Days per week: Not on file    Minutes per session: Not on file  . Stress: Not on file  Relationships  . Social connections:    Talks on phone: Not on file    Gets together: Not on file    Attends  religious service: Not on file    Active member of club or organization: Not on file    Attends meetings of clubs or organizations: Not on file    Relationship status: Not on file  Other Topics Concern  . Not on file  Social History Narrative  . Not on file   Additional Social History:                         Sleep: Fair  Appetite:  Fair  Current Medications: Current Facility-Administered Medications  Medication Dose Route Frequency Provider Last Rate Last Dose  . acetaminophen (TYLENOL) tablet 650 mg  650 mg Oral Q6H PRN Laveda AbbeParks, Laurie Britton, NP   650 mg at 12/17/17 2117  . alum & mag hydroxide-simeth (MAALOX/MYLANTA) 200-200-20 MG/5ML suspension 30 mL  30 mL Oral Q4H PRN Laveda AbbeParks, Laurie Britton, NP      . diphenhydrAMINE (BENADRYL) injection 50 mg  50 mg Intramuscular Q6H PRN Laveda AbbeParks, Laurie Britton, NP      . hydrOXYzine (ATARAX/VISTARIL) tablet 25 mg  25 mg Oral TID PRN Laveda AbbeParks, Laurie Britton, NP   25 mg at 12/18/17 2013  . ibuprofen (ADVIL,MOTRIN) tablet 600 mg  600 mg Oral Q8H PRN Laveda AbbeParks, Laurie Britton, NP   600 mg at 12/19/17 0929  . LORazepam (ATIVAN) injection 2 mg  2 mg Intramuscular Q4H PRN Laveda AbbeParks, Laurie Britton, NP      . magnesium hydroxide (MILK OF MAGNESIA) suspension 30 mL  30 mL Oral Daily PRN Laveda AbbeParks, Laurie Britton, NP      . OLANZapine [facility](ZYPREXA) tablet 5 mg  5 mg Oral Daily Oneta RackLewis, Tanika N, NP       And  . OLANZapine (ZYPREXA) tablet 10 mg  10 mg Oral QHS Oneta RackLewis, Tanika N, NP      . ondansetron (ZOFRAN) tablet 4 mg  4 mg Oral Q8H PRN Laveda AbbeParks, Laurie Britton, NP      . traZODone (DESYREL) tablet 50 mg  50 mg Oral QHS PRN Laveda AbbeParks, Laurie Britton, NP   50 mg at 12/18/17 2154    Lab Results:  Results for orders placed or performed during the hospital encounter of 12/17/17 (from the past 48 hour(s))  TSH     Status: None   Collection Time: 12/19/17  6:46 AM  Result Value Ref Range   TSH 2.177 0.350 - 4.500 uIU/mL    Comment: Performed by a 3rd Generation assay  with a functional sensitivity of <=0.01 uIU/mL. Performed at [facility], [address] [place], [place] [address]   Lipid panel     Status: Abnormal   Collection Time: 12/19/17  6:46 AM  Result Value Ref Range   Cholesterol 182 0 - 200 mg/dL   Triglycerides 604204 (H) <150 mg/dL   HDL 30 (L) >54>40 mg/dL   Total CHOL/HDL Ratio 6.1 RATIO   VLDL 41 (H) 0 - 40 mg/dL   LDL Cholesterol 098111 (  H) 0 - 99 mg/dL    Comment:        Total Cholesterol/HDL:CHD Risk Coronary Heart Disease Risk Table                     Men   Women  1/2 Average Risk   3.4   3.3  Average Risk       5.0   4.4  2 X Average Risk   9.6   7.1  3 X Average Risk  23.4   11.0        Use the calculated Patient Ratio above and the CHD Risk Table to determine the patient's CHD Risk.        ATP III CLASSIFICATION (LDL):  <100     mg/dL   Optimal  409-811  mg/dL   Near or Above                    Optimal  130-159  mg/dL   Borderline  914-782  mg/dL   High  >956     mg/dL   Very High Performed at [facility], [address] [place] [address], [place], [place] [address]     Blood Alcohol level:  Lab Results  Component Value Date   ETH <10 12/16/2017   ETH <10 08/18/2017    Metabolic Disorder Labs: Lab Results  Component Value Date   HGBA1C 6.1 (H) 04/02/2015   MPG 128 04/02/2015   Lab Results  Component Value Date   PROLACTIN 7.0 04/04/2015   Lab Results  Component Value Date   CHOL 182 12/19/2017   TRIG 204 (H) 12/19/2017   HDL 30 (L) 12/19/2017   CHOLHDL 6.1 12/19/2017   VLDL 41 (H) 12/19/2017   LDLCALC 111 (H) 12/19/2017   LDLCALC 96 04/04/2015    Physical Findings: AIMS: Facial and Oral Movements Muscles of Facial Expression: None, normal Lips and Perioral Area: None, normal Jaw: None, normal Tongue: None, normal,Extremity Movements Upper (arms, wrists, hands, fingers): None, normal Lower (legs, knees, ankles, toes): None, normal, Trunk Movements Neck, shoulders, hips:  None, normal, Overall Severity Severity of abnormal movements (highest score from questions above): None, normal Incapacitation due to abnormal movements: None, normal Patient's awareness of abnormal movements (rate only patient's report): No Awareness, Dental Status Current problems with teeth and/or dentures?: No Does patient usually wear dentures?: No  CIWA:  CIWA-Ar Total: 2 COWS:  COWS Total Score: 2  Musculoskeletal: Strength & Muscle Tone: within normal limits Gait & Station: normal Patient leans: N/A  Psychiatric Specialty Exam: Physical Exam  Vitals reviewed. Constitutional: He appears well-developed.  Psychiatric: He has a normal mood and affect. His behavior is normal.    Review of Systems  Psychiatric/Behavioral: Positive for depression.  All other systems reviewed and are negative.   Blood pressure 132/88, pulse 80, temperature 98.5 F (36.9 C), temperature source Oral, resp. rate 18, height 5\' 3"  (1.6 m), weight 77.1 kg (169 lb 15.6 oz), SpO2 98 %.Body mass index is 30.11 kg/m.  General Appearance: Bizarre and Casual  Eye Contact:  Fair  Speech:  Clear and Coherent  Volume:  Normal  Mood:  Anxious  Affect:  Depressed and Flat  Thought Process:  Coherent  Orientation:  Full (Time, Place, and Person)  Thought Content:  Paranoid Ideation and Rumination  Suicidal Thoughts:  No  Homicidal Thoughts:  No  Memory:  Immediate;   Fair Recent;   Fair Remote;   Fair  Judgement:  Fair  Insight:  Fair  Psychomotor Activity:  Normal  Concentration:  Concentration: Fair  Recall:  [employer] of Knowledge:  Fair  Language:  Fair  Akathisia:  No  Handed:  Right  AIMS (if indicated):     Assets:  Communication Skills Desire for Improvement Resilience Social Support  ADL's:  Intact  Cognition:  WNL  Sleep:  Number of Hours: 6.75     Treatment Plan Summary: Daily contact with patient to assess and evaluate symptoms and progress in treatment and Medication  management   Continue with current treatment plan on 12/19/2017 as listed below except where noted   Mood stabilization  Increased Zyprexa 5 mg PO BID to 5 am and 10 mg qhs   Insomnia   Continue trazodone  50 mg     Will continue to monitor vitals ,medication compliance and treatment side effects while patient is here.   CSW will continue working on disposition.  Patient to participate in therapeutic milieu   Oneta Rack, NP 12/19/2017, 10:32 AM    ..Agree with NP Progress Note

## 2017-12-19 NOTE — [facility] Group Notes (Signed)
Adult Psychoeducational Group Note  Date:  12/19/2017 Time:  10:00 AM  Group Topic/Focus: Love Language Healthy Communication:   The focus of this group is to discuss communication, barriers to communication, as well as healthy ways to communicate with others.  Participation Level:  Active  Participation Quality:  Appropriate and Attentive  Affect:  Appropriate  Cognitive:  Alert and Oriented  Insight: Improving  Engagement in Group:  Developing/Improving  Modes of Intervention:  Discussion and Education  Additional Comments:  Patient was more open during the second group. Patient read aloud the Love Language Acts of Service and reported he identified with Physical Touch. Patient reports his family is supportive and he misses his daughter.  Marchelle Folksmanda A Cordae Mccarey 12/19/2017, 10:30 AM

## 2017-12-19 NOTE — [facility] Group Notes (Signed)
**  Late Entry**  LCSW Group Therapy Note  12/18/2017   10:00--11:00am   Type of Therapy and Topic:  Group Therapy: Anger Cues and Responses  Participation Level:  Active   Description of Group:   In this group, patients learned how to recognize the physical, cognitive, emotional, and behavioral responses they have to anger-provoking situations.  They identified a recent time they became angry and how they reacted.  They analyzed how their reaction was possibly beneficial and how it was possibly unhelpful.  The group discussed a variety of healthier coping skills that could help with such a situation in the future.  Deep breathing was practiced briefly.  Therapeutic Goals: 1. Patients will remember their last incident of anger and how they felt emotionally and physically, what their thoughts were at the time, and how they behaved. 2. Patients will identify how their behavior at that time worked for them, as well as how it worked against them. 3. Patients will explore possible new behaviors to use in future anger situations. 4. Patients will learn that anger itself is normal and cannot be eliminated, and that healthier reactions can assist with resolving conflict rather than worsening situations.  Summary of Patient Progress:  The patient shared that his most recent time of anger was associated with the death of a child and said he used drugs to self-medicate. Patient attended anger management group. Material was introduced included recognition of emotional and physical cues associated with anger. Contrasting past behavior versus utilizing self-awareness and implementing coping strategies going forward.  Therapeutic Modalities:   Cognitive Behavioral Therapy  Henrene Dodgeonnie Analyce Tavares, LCSW  Evorn Gongonnie D Britanee Vanblarcom

## 2017-12-20 LAB — PROLACTIN: PROLACTIN: 45.8 ng/mL — AB (ref 4.0–15.2)

## 2017-12-20 MED ORDER — HYDROXYZINE HCL 50 MG PO TABS
50.0000 mg | ORAL_TABLET | Freq: Four times a day (QID) | ORAL | Status: DC | PRN
  Administered 2017-12-20 – 2017-12-21 (×2): 50 mg via ORAL
  Filled 2017-12-20 (×2): qty 1

## 2017-12-20 MED ORDER — OLANZAPINE 7.5 MG PO TABS
15.0000 mg | ORAL_TABLET | Freq: Every day | ORAL | Status: DC
  Administered 2017-12-20 – 2017-12-22 (×3): 15 mg via ORAL
  Filled 2017-12-20 (×5): qty 2

## 2017-12-20 MED ORDER — OLANZAPINE 5 MG PO TABS
5.0000 mg | ORAL_TABLET | Freq: Every day | ORAL | Status: DC
Start: 2017-12-21 — End: 2017-12-23
  Administered 2017-12-21 – 2017-12-23 (×3): 5 mg via ORAL
  Filled 2017-12-20 (×5): qty 1

## 2017-12-20 NOTE — [facility] Group Notes (Signed)
LCSW Group Therapy Note   12/20/2017 1:15pm   Type of Therapy and Topic:  Group Therapy:  Overcoming Obstacles   Participation Level:  Minimal   Description of Group:    In this group patients will be encouraged to explore what they see as obstacles to their own wellness and recovery. They will be guided to discuss their thoughts, feelings, and behaviors related to these obstacles. The group will process together ways to cope with barriers, with attention given to specific choices patients can make. Each patient will be challenged to identify changes they are motivated to make in order to overcome their obstacles. This group will be process-oriented, with patients participating in exploration of their own experiences as well as giving and receiving support and challenge from other group members.   Therapeutic Goals: 1. Patient will identify personal and current obstacles as they relate to admission. 2. Patient will identify barriers that currently interfere with their wellness or overcoming obstacles.  3. Patient will identify feelings, thought process and behaviors related to these barriers. 4. Patient will identify two changes they are willing to make to overcome these obstacles:      Summary of Patient Progress   In and out several times. Engaged when present.  "I need to quit using meth.  I do it to be more social, but it is causing me problems with my family.  States he will stay away from it "by focusing on my daughter." Denied the need for rehab.  "I want to do everything outpt-and then if that doesn't work, I would go to rehab.   Therapeutic Modalities:   Cognitive Behavioral Therapy Solution Focused Therapy Motivational Interviewing Relapse Prevention Therapy  [name] RogueRodney B Ein Rijo, LCSW 12/20/2017 3:10 PM

## 2017-12-20 NOTE — Plan of Care (Signed)
Pt continues to progress towards goals and d/c. RN will continue to monitor.  

## 2017-12-20 NOTE — Plan of Care (Signed)
Problem: Safety: Goal: Periods of time without injury will increase Intervention: Patient contracts for safety on the unit. Low fall risk precautions in place. Safety monitored with q15 minute checks. Outcome: Patient remains safe on the unit at this time. 12/20/2017 2:37 PM - Progressing by Ferrel Loganollazo, Messiyah Waterson A, RN

## 2017-12-20 NOTE — Progress Notes (Addendum)
Patient ID: [name] [name], male   DOB: Feb 18, 1981, 37 y.o.   MRN: [id number]  Nursing Progress Note [id number]  Data: Patient presents with anxious mood and preoccupied thinking. Patient was very focused on discharge this morning. Patient demonstrates poor insight into his hospitalization. Patient complaint with scheduled medications. Patient denies pain/physical complaints. Patient provided but declined to complete their self-inventory sheet. Patient is seen attending groups. Patient can be intrusive at times with pressured/tangetial speech. Patient reports having "a really good phone call" with his dad today. Patient currently denies SI/HI/AVH.   Action: Patient educated about and provided medication per provider's orders. Patient safety maintained with q15 min safety checks and frequent rounding. Low fall risk precautions in place. Emotional support given. 1:1 interaction and active listening provided. Patient encouraged to attend meals and groups. Patient encouraged to work on treatment plan and goals. Labs, vital signs and patient behavior monitored throughout shift.   Response: Patient agrees to come to staff if any thoughts of SI/HI develop or if patient develops intention of acting on thoughts. Patient remains safe on the unit at this time. Patient is interacting with peers appropriately on the unit. Will continue to support and monitor.

## 2017-12-20 NOTE — [facility] Suicide Risk Assessment (Signed)
[facility] INPATIENT:  Family/Significant Other Suicide Prevention Education  Suicide Prevention Education:  Education Completed; [name] [name], sister, [phone] has been identified by the patient as the family member/significant other with whom the patient will be residing, and identified as the person(s) who will aid the patient in the event of a mental health crisis (suicidal ideations/suicide attempt).  With written consent from the patient, the family member/significant other has been provided the following suicide prevention education, prior to the and/or following the discharge of the patient.  The suicide prevention education provided includes the following:  Suicide risk factors  Suicide prevention and interventions  National Suicide Hotline telephone number  [facility] assessment telephone number  [facility] Emergency Assistance 911  [facility] and/or Residential Mobile Crisis Unit telephone number  Request made of family/significant other to:  Remove weapons (e.g., guns, rifles, knives), all items previously/currently identified as safety concern.    Remove drugs/medications (over-the-counter, prescriptions, illicit drugs), all items previously/currently identified as a safety concern.  The family member/significant other verbalizes understanding of the suicide prevention education information provided.  The family member/significant other agrees to remove the items of safety concern listed above. Sister states the entire family was fearful based on behavior prior to admission.  They are aware of both mental illness and meth use, and are not OK with his return home until he gets more help.  [facility].  [name] DaubRodney B [name] 12/20/2017, 4:22 PM

## 2017-12-20 NOTE — Tx Team (Signed)
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 acetaminophen (TYLENOL) tablet 650 mg  650 mg Oral Q6H PRN Ethelene Hal, NP   650 mg at 12/17/17 2117  . alum & mag hydroxide-simeth (MAALOX/MYLANTA) 200-200-20 MG/5ML suspension 30 mL  30 mL Oral Q4H PRN Ethelene Hal, NP      . diphenhydrAMINE (BENADRYL) injection 50 mg  50 mg Intramuscular Q6H PRN Ethelene Hal, NP      . hydrOXYzine (ATARAX/VISTARIL) tablet 25 mg  25 mg Oral TID PRN Ethelene Hal, NP   25 mg at 12/19/17 2054  . ibuprofen (ADVIL,MOTRIN) tablet 600 mg  600 mg Oral Q8H PRN Ethelene Hal, NP   600 mg at 12/19/17 2054  . LORazepam (ATIVAN) injection 2 mg  2 mg Intramuscular Q4H PRN Ethelene Hal, NP      . magnesium hydroxide (MILK OF MAGNESIA) suspension 30 mL  30 mL Oral Daily PRN Ethelene Hal, NP      . nicotine (NICODERM CQ - dosed in mg/24 hours) patch 21 mg  21 mg Transdermal Daily Derrill Center, NP   21 mg at 12/19/17 1137  . OLANZapine (ZYPREXA) tablet 5 mg  5 mg Oral Daily Derrill Center, NP   5 mg at 12/20/17 1275   And  . OLANZapine (ZYPREXA) tablet 10 mg  10 mg Oral QHS Derrill Center, NP   10 mg at 12/19/17 2055  . ondansetron (ZOFRAN) tablet 4 mg  4 mg Oral Q8H PRN Ethelene Hal, NP      . traZODone (DESYREL) tablet 50 mg  50 mg Oral QHS PRN Ethelene Hal, NP   50 mg at 12/19/17 2054    PTA Medications: Medications Prior to Admission  Medication Sig Dispense Refill Last Dose  . divalproex  (DEPAKOTE ER) 500 MG 24 hr tablet Take 1,000 mg by mouth at bedtime.    Past Week at Unknown time  . gabapentin (NEURONTIN) 100 MG capsule Take 2 capsules (200 mg total) by mouth 2 (two) times daily. (Patient not taking: Reported on 08/19/2017) 120 capsule 1 Not Taking at Unknown time  . hydrOXYzine (ATARAX/VISTARIL) 25 MG tablet Take 25 mg by mouth 3 (three) times daily.  0 Past Week at Unknown time  . mirtazapine (REMERON) 15 MG tablet Take 15 mg by mouth at bedtime.   Past Week at Unknown time  . ziprasidone (GEODON) 80 MG capsule Take 80 mg by mouth 2 (two) times daily with a meal.    Past Week at Unknown time    Patient Stressors: Marital or family conflict Substance abuse  Patient Strengths: Average or above average intelligence General fund of knowledge Supportive family/friends  Treatment Modalities: Medication Management, Group therapy, Case management,  1 to 1 session with clinician, Psychoeducation, Recreational therapy.   Physician Treatment Plan for Primary Diagnosis: Schizophrenia, paranoid type ([place]) Long Term Goal(s): Improvement in symptoms so as ready for discharge  Short Term Goals: Ability to identify changes in lifestyle to reduce recurrence of condition will improve Ability to verbalize feelings will improve Ability to identify and develop effective coping behaviors will  improve Ability to disclose and discuss suicidal ideas Ability to demonstrate self-control will improve Ability to identify and develop effective coping behaviors will improve Ability to identify triggers associated with substance abuse/mental health issues will improve  Medication Management: Evaluate patient's response, side effects, and tolerance of medication regimen.  Therapeutic Interventions: 1 to 1 sessions, Unit Group sessions and Medication administration.  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[place])   Long Term Goal(s): Improvement in symptoms so as ready for discharge  Short Term Goals: Ability to identify changes in lifestyle to reduce recurrence of condition will improve Ability to verbalize feelings will improve Ability to identify and develop effective coping behaviors will improve Ability to disclose and discuss suicidal ideas Ability to demonstrate self-control will improve Ability to identify and develop effective coping behaviors will improve Ability to identify triggers associated with substance abuse/mental health issues will improve  Medication Management: Evaluate patient's response, side effects, and tolerance of medication regimen.  Therapeutic Interventions: 1 to 1 sessions, Unit Group sessions and Medication administration.  Evaluation of Outcomes: Progressing   RN Treatment Plan for Primary Diagnosis: Schizophrenia, paranoid type ([place]) Long Term Goal(s): Knowledge of disease and therapeutic regimen to maintain health will improve  Short Term Goals: Ability to identify and develop effective coping behaviors will improve and Compliance with prescribed medications will improve  Medication Management: RN will administer medications as ordered by provider, will assess and evaluate patient's response and provide education to patient for prescribed medication. RN will report any adverse and/or side effects to prescribing provider.  Therapeutic Interventions: 1 on 1 counseling sessions, Psychoeducation, Medication administration, Evaluate responses to treatment, Monitor vital signs and CBGs as ordered, Perform/monitor CIWA, COWS, AIMS and Fall Risk screenings as ordered, Perform wound care treatments as ordered.  Evaluation of Outcomes: Progressing   LCSW Treatment Plan for Primary Diagnosis: Schizophrenia, paranoid type ([place]) Long Term  Goal(s): Safe transition to appropriate next level of care at discharge, Engage patient in therapeutic group addressing interpersonal concerns.  Short Term Goals: Engage patient in aftercare planning with referrals and resources  Therapeutic Interventions: Assess for all discharge needs, 1 to 1 time with Social worker, Explore available resources and support systems, Assess for adequacy in community support network, Educate family and significant other(s) on suicide prevention, Complete Psychosocial Assessment, Interpersonal group therapy.  Evaluation of Outcomes: Met  Return home, follow up [facility]   Progress in Treatment: Attending groups: Yes Participating in groups: Yes Taking medication as prescribed: Yes Toleration medication: Yes, no side effects reported at this time Family/Significant other contact made: No Patient understands diagnosis: Yes AEB asking for help with confusion and substance use Discussing patient identified problems/goals with staff: Yes Medical problems stabilized or resolved: Yes Denies suicidal/homicidal ideation: Yes Issues/concerns per patient self-inventory: None Other: N/A  New problem(s) identified: None identified at this time.   New Short Term/Long Term Goal(s): "I want to get on the right meds, get better sleep, get out ASAP and get my addiction under control."   Discharge Plan or Barriers:   Reason for Continuation of Hospitalization: Insomnia Hallucinations Medication stabilization   Estimated Length of Stay: 6/21  Attendees: Patient: Massimiliano Rohleder 12/20/2017  10:04 AM  Physician: Maris Berger, MD 12/20/2017  10:04 AM  Nursing: Baldo Daub, RN 12/20/2017  10:04 AM  RN Care Manager: Lars Pinks, RN 12/20/2017  10:04 AM  Social Worker: Ripley Fraise 12/20/2017  10:04 AM  Recreational Therapist: Winfield Cunas 12/20/2017  10:04 AM  Other: Norberto Sorenson 12/20/2017  10:04 AM  Other:  12/20/2017  10:04 AM    Scribe for Treatment  Team:  Roque Lias LCSW 12/20/2017 10:04 AM

## 2017-12-20 NOTE — Progress Notes (Signed)
Recreation Therapy Notes  Date: 6.17.19 Time: 1000 Location: [address] Dayroom  Group Topic: Triggers  Goal Area(s) Addresses:  Patient will be able to identify triggers.  Patient will identify problems caused by triggers. Patient will identify three coping skills to deal with triggers.  Behavioral Response: Engaged  Intervention: Worksheet  Activity: Triggers.  LRT gave patients a worksheet in which patients were to identify their triggers, the problems their triggers contribute to and come up with a trigger for each category (emotional state, people, places, things, thoughts and activities/situations) presented.  Education:Communication, Discharge Planning  Education Outcome: Acknowledges understanding/In group clarification offered/Needs additional education.   Clinical Observations/Feedback:  Pt was rambling and kept repeating himself.  Pt stated the problem his triggers caused were "bad thoughts that seem real, people bothering me, cigarettes and health deteriorating".  Pt identified her triggers as follows: emotional state - very angry, uncontrollable; people- friends; thoughts- wife cheating/people are after me and activities/situations- pending court charges and court date.  Pt stated his coping skills were talking to a friend, attend groups, see outpatient therapist and learn to read english better.    [name], LRT/CTRS    Caroll RancherLindsay, [name] 12/20/2017 12:16 PM

## 2017-12-20 NOTE — Progress Notes (Signed)
[facility] MD Progress Note  12/20/2017 4:42 PM [name] [name]  MRN:  [id number] Subjective:    [name] [name] is a 37 y/o M with history of schizophrenia who was admitted on IVC initiated by his sister with worsening agitation, disorganized behaviors, sending threatening texts to his family, and worsening use of methamphetamines. Pt was started on trial of zyprexa, and dose has been titrated up during his stay. He has been reporting incremental improvement of his presenting symptoms.  Today upon evaluation, pt is somewhat disorganized and tangential, but generally he is cooperative with interview. He was asked to share about his reasons for coming to the hospital, and he states, "I keep doing drugs. I get short of breath sometimes when I go to sleep. I keep thinking too much. I keep saying things that come out of nowhere, and then I get mad and it comes out as yelling. My thoughts say 'that's not my child.' My thoughts say that my father is trying to molest my daughter, but I know that's not true." Pt reports that overall his symptoms and intrusive thoughts have been improving during his stay. He denies other specific concerns today. He reports some pain in his R great toe, but it is not impeding his ability to ambulate, and he agrees to have follow up with his PCP. He reports some AH of "hearing Bible verses," and he denies SI/HI/VH. Discussed with patient about increasing dose of zyprexa at bedtime, and he was in agreement. He had no further questions, comments, or concerns.  Principal Problem: Schizophrenia, paranoid type (HCC) Diagnosis:   Patient Active Problem List   Diagnosis Date Noted  . 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 Schizophrenia, paranoid type (HCC) [F20.0] 12/17/2017  . Schizoaffective disorder, bipolar type (HCC) [F25.0] 05/07/2016  . Cocaine abuse (HCC) [F14.10] 05/07/2016  . Cannabis use disorder, moderate, in sustained  remission (HCC) [F12.21] 04/03/2015  . Alcohol use disorder, moderate, in sustained remission (HCC) [F10.21] 04/03/2015   Total Time spent with patient: 30 minutes  Past Psychiatric History: see H&P  Past Medical History:  Past Medical History:  Diagnosis Date  . Anxiety   . Depression   . Mental disorder   . Schizophrenia (HCC)    History reviewed. No pertinent surgical history. Family History:  Family History  Problem Relation Age of Onset  . Mental illness Neg Hx    Family Psychiatric  History: see H&P Social History:  Social History   Substance and Sexual Activity  Alcohol Use Not Currently   Comment: haven't drank in months"     Social History   Substance and Sexual Activity  Drug Use Yes  . Types: Marijuana, Methamphetamines   Comment: Last used: 2 weeks ago    Social History   Socioeconomic History  . Marital status: Single    Spouse name: Not on file  . Number of children: Not on file  . Years of education: Not on file  . Highest education level: Not on file  Occupational History  . Not on file  Social Needs  . Financial resource strain: Not on file  . Food insecurity:    Worry: Not on file    Inability: Not on file  . Transportation needs:    Medical: Not on file    Non-medical: Not on file  Tobacco Use  . Smoking status: Current Every Day Smoker    Packs/day: 1.00    Types: Cigarettes  . Smokeless tobacco:  Former NeurosurgeonUser  Substance and Sexual Activity  . Alcohol use: Not Currently    Comment: haven't drank in months"  . Drug use: Yes    Types: Marijuana, Methamphetamines    Comment: Last used: 2 weeks ago  . Sexual activity: Yes    Birth control/protection: None  Lifestyle  . Physical activity:    Days per week: Not on file    Minutes per session: Not on file  . Stress: Not on file  Relationships  . Social connections:    Talks on phone: Not on file    Gets together: Not on file    Attends religious service: Not on file    Active  member of club or organization: Not on file    Attends meetings of clubs or organizations: Not on file    Relationship status: Not on file  Other Topics Concern  . Not on file  Social History Narrative  . Not on file   Additional Social History:                         Sleep: Good  Appetite:  Good  Current Medications: Current Facility-Administered Medications  Medication Dose Route Frequency Provider Last Rate Last Dose  . acetaminophen (TYLENOL) tablet 650 mg  650 mg Oral Q6H PRN Laveda AbbeParks, Laurie Britton, NP   650 mg at 12/17/17 2117  . alum & mag hydroxide-simeth (MAALOX/MYLANTA) 200-200-20 MG/5ML suspension 30 mL  30 mL Oral Q4H PRN Laveda AbbeParks, Laurie Britton, NP      . diphenhydrAMINE (BENADRYL) injection 50 mg  50 mg Intramuscular Q6H PRN Laveda AbbeParks, Laurie Britton, NP      . hydrOXYzine (ATARAX/VISTARIL) tablet 25 mg  25 mg Oral TID PRN Laveda AbbeParks, Laurie Britton, NP   25 mg at 12/19/17 2054  . ibuprofen (ADVIL,MOTRIN) tablet 600 mg  600 mg Oral Q8H PRN Laveda AbbeParks, Laurie Britton, NP   600 mg at 12/20/17 1509  . LORazepam (ATIVAN) injection 2 mg  2 mg Intramuscular Q4H PRN Laveda AbbeParks, Laurie Britton, NP      . magnesium hydroxide (MILK OF MAGNESIA) suspension 30 mL  30 mL Oral Daily PRN Laveda AbbeParks, Laurie Britton, NP      . nicotine (NICODERM CQ - dosed in mg/24 hours) patch 21 mg  21 mg Transdermal Daily Oneta RackLewis, Tanika N, NP   21 mg at 12/19/17 1137  . OLANZapine (ZYPREXA) tablet 15 mg  15 mg Oral QHS Micheal Likensainville, Dmari Schubring T, MD       And  . Melene Muller[START ON 12/21/2017] OLANZapine (ZYPREXA) tablet 5 mg  5 mg Oral Daily Micheal Likensainville, Hana Trippett T, MD      . ondansetron [facility](ZOFRAN) tablet 4 mg  4 mg Oral Q8H PRN Laveda AbbeParks, Laurie Britton, NP      . traZODone (DESYREL) tablet 50 mg  50 mg Oral QHS PRN Laveda AbbeParks, Laurie Britton, NP   50 mg at 12/19/17 2054    Lab Results:  Results for orders placed or performed during the hospital encounter of 12/17/17 (from the past 48 hour(s))  TSH     Status: None   Collection Time:  12/19/17  6:46 AM  Result Value Ref Range   TSH 2.177 0.350 - 4.500 uIU/mL    Comment: Performed by a 3rd Generation assay with a functional sensitivity of <=0.01 uIU/mL. Performed at [facility], [address] [place], [place] [address]   Lipid panel     Status: Abnormal   Collection Time: 12/19/17  6:46  AM  Result Value Ref Range   Cholesterol 182 0 - 200 mg/dL   Triglycerides 454 (H) <150 mg/dL   HDL 30 (L) >09 mg/dL   Total CHOL/HDL Ratio 6.1 RATIO   VLDL 41 (H) 0 - 40 mg/dL   LDL Cholesterol 811 (H) 0 - 99 mg/dL    Comment:        Total Cholesterol/HDL:CHD Risk Coronary Heart Disease Risk Table                     Men   Women  1/2 Average Risk   3.4   3.3  Average Risk       5.0   4.4  2 X Average Risk   9.6   7.1  3 X Average Risk  23.4   11.0        Use the calculated Patient Ratio above and the CHD Risk Table to determine the patient's CHD Risk.        ATP III CLASSIFICATION (LDL):  <100     mg/dL   Optimal  914-782  mg/dL   Near or Above                    Optimal  130-159  mg/dL   Borderline  956-213  mg/dL   High  >086     mg/dL   Very High Performed at [facility], [address] [place], [place] [address]   Prolactin     Status: Abnormal   Collection Time: 12/19/17  6:46 AM  Result Value Ref Range   Prolactin 45.8 (H) 4.0 - 15.2 ng/mL    Comment: (NOTE) Performed At: [facility] [address] [place], [place] [id number] Jolene Schimke MD LK:[id number] Performed at [facility], [address] [place], [place] [address]   Hemoglobin A1c     Status: Abnormal   Collection Time: 12/19/17  6:46 AM  Result Value Ref Range   Hgb A1c MFr Bld 5.7 (H) 4.8 - 5.6 %    Comment: (NOTE) Pre diabetes:          5.7%-6.4% Diabetes:              >6.4% Glycemic control for   <7.0% adults with diabetes    Mean Plasma Glucose 116.89 mg/dL    Comment: Performed at [facility], [address] [place], [place] [address]    Blood Alcohol level:  Lab Results  Component Value Date   ETH <10 12/16/2017   ETH <10 08/18/2017    Metabolic Disorder Labs: Lab Results  Component Value Date   HGBA1C 5.7 (H) 12/19/2017   MPG 116.89 12/19/2017   MPG 128 04/02/2015   Lab Results  Component Value Date   PROLACTIN 45.8 (H) 12/19/2017   PROLACTIN 7.0 04/04/2015   Lab Results  Component Value Date   CHOL 182 12/19/2017   TRIG 204 (H) 12/19/2017   HDL 30 (L) 12/19/2017   CHOLHDL 6.1 12/19/2017   VLDL 41 (H) 12/19/2017   LDLCALC 111 (H) 12/19/2017   LDLCALC 96 04/04/2015    Physical Findings: AIMS: Facial and Oral Movements Muscles of Facial Expression: None, normal Lips and Perioral Area: None, normal Jaw: None, normal Tongue: None, normal,Extremity Movements Upper (arms, wrists, hands, fingers): None, normal Lower (legs, knees, ankles, toes): None, normal, Trunk Movements Neck, shoulders, hips: None, normal, Overall Severity Severity of abnormal movements (highest score from questions above): None, normal Incapacitation  due to abnormal movements: None, normal Patient's awareness of abnormal movements (rate only patient's report): No Awareness, Dental Status Current problems with teeth and/or dentures?: No Does patient usually wear dentures?: No  CIWA:  CIWA-Ar Total: 2 COWS:  COWS Total Score: 2  Musculoskeletal: Strength & Muscle Tone: within normal limits Gait & Station: normal Patient leans: N/A  Psychiatric Specialty Exam: Physical Exam  Nursing note and vitals reviewed.   Review of Systems  Constitutional: Negative for chills and fever.  Respiratory: Negative for cough and shortness of breath.   Cardiovascular: Negative for chest pain.  Gastrointestinal: Negative for abdominal pain, heartburn, nausea and vomiting.  Musculoskeletal: Positive for joint pain.  Psychiatric/Behavioral: Positive for hallucinations. Negative for depression and suicidal  ideas. The patient is not nervous/anxious and does not have insomnia.     Blood pressure (!) 143/89, pulse 77, temperature (!) 97.5 F (36.4 C), temperature source Oral, resp. rate 20, height 5\' 3"  (1.6 m), weight 77.1 kg (169 lb 15.6 oz), SpO2 98 %.Body mass index is 30.11 kg/m.  General Appearance: Casual and Fairly Groomed  Eye Contact:  Good  Speech:  Clear and Coherent and Normal Rate  Volume:  Normal  Mood:  Euthymic  Affect:  Appropriate, Congruent and Flat  Thought Process:  Coherent, Disorganized and Descriptions of Associations: Tangential  Orientation:  Full (Time, Place, and Person)  Thought Content:  Hallucinations: Auditory  Suicidal Thoughts:  No  Homicidal Thoughts:  No  Memory:  Immediate;   Fair Recent;   Fair Remote;   Fair  Judgement:  Poor  Insight:  Lacking  Psychomotor Activity:  Normal  Concentration:  Concentration: Fair  Recall:  Poor  Fund of Knowledge:  Fair  Language:  Fair  Akathisia:  No  Handed:    AIMS (if indicated):     Assets:  Communication Skills Resilience Social Support  ADL's:  Intact  Cognition:  WNL  Sleep:  Number of Hours: 6.75   Treatment Plan Summary: Daily contact with patient to assess and evaluate symptoms and progress in treatment and Medication management   -Continue inpatient hospitalization  -Schizophrenia  -Change zyprexa 5mg  qAM+10mg  po qhs to zyprexa 5mg  po qAM+15mg  po qhs  -Agitation    -continue benadryl 50mg  IM q6h prn agitation    -Continue ativan 2mg  IM q4h prn agitation  -Anxiety    -Continue vistaril 50mg  po q6h prn anxiety  -Insomnia  -Continue trazodone 50mg  po qhs prn insomnia  -Encourage participation in groups and therapeutic milieu  -Disposition planning will be ongoing  Micheal Likens, MD 12/20/2017, 4:42 PM
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
## 2017-12-21 NOTE — Progress Notes (Signed)
Recreation Therapy Notes  Date: 6.18.19 Time: 1000 Location: 500 Hall Dayroom   Group Topic: Communication, Team Building, Problem Solving  Goal Area(s) Addresses:  Patient will effectively work with peer towards shared goal.  Patient will identify skills used to make activity successful.  Patient will identify how skills used during activity can be used to reach post d/c goals.   Behavioral Response: Engaged  Intervention: STEM Activity  Activity: Stage managerLanding Pad. In teams patients were given 12 plastic drinking straws and a length of masking tape. Using the materials provided patients were asked to build a landing pad to catch a golf ball dropped from approximately 6 feet in the air.   Education: Pharmacist, communityocial Skills, Discharge Planning   Education Outcome: Acknowledges education/In group clarification offered/Needs additional education.   Clinical Observations/Feedback: Pt took the leadership role with his group in completing the activity.  Pt was engaged and active throughout the activity.  Pt wasn't as hyper verbal and able to focus.  Pt stated he needed his team to "help keep the tape straight".  Pt also explained they had to "have faith" to complete task.  Pt explained the skills from this activity could be used in "church and dinner".      Caroll RancherMarjette Ebelyn Bohnet, LRT/CTRS     Caroll RancherLindsay, Jamis Kryder A 12/21/2017 11:33 AM

## 2017-12-21 NOTE — BHH Group Notes (Signed)
LCSW Group Therapy Note   12/21/2017 1:15pm   Type of Therapy and Topic:  Group Therapy:  Positive Affirmations   Participation Level:  Minimal  Description of Group: This group addressed positive affirmation toward self and others. Patients went around the room and identified two positive things about themselves and two positive things about a peer in the room. Patients reflected on how it felt to share something positive with others, to identify positive things about themselves, and to hear positive things from others. Patients were encouraged to have a daily reflection of positive characteristics or circumstances.  Therapeutic Goals 1. Patient will verbalize two of their positive qualities 2. Patient will demonstrate empathy for others by stating two positive qualities about a peer in the group 3. Patient will verbalize their feelings when voicing positive self affirmations and when voicing positive affirmations of others 4. Patients will discuss the potential positive impact on their wellness/recovery of focusing on positive traits of self and others. Summary of Patient Progress:  In and out a couple of times.  Engaged when present. "I am a turtle.  I live a long time and deal with a lot of challenges.  But I keep trying and never give up."  He felt the analogy was perfect while no one else understood it.  Therapeutic Modalities Cognitive Behavioral Therapy Motivational Interviewing  Ida RogueRodney B Ceira Hoeschen, [place] 12/21/2017 2:37 PM

## 2017-12-21 NOTE — Progress Notes (Signed)
Patient denies SI, HI and AVH.  Patient has attended groups, been compliant with medications and engaged in unit activities. Patient has had no incidents of behavioral dyscontrol this shift.   Assess patient for safety, offer medications as prescribed, engage patient in 1:1 staff talks.   Patient able to contract for safety. Continue to monitor as planned.

## 2017-12-21 NOTE — Progress Notes (Signed)
Adult Psychoeducational Group Note  Date:  12/21/2017 Time:  8:39 PM  Group Topic/Focus:  Wrap-Up Group:   The focus of this group is to help patients review their daily goal of treatment and discuss progress on daily workbooks.  Participation Level:  Active  Participation Quality:  Appropriate  Affect:  Appropriate  Cognitive:  Appropriate  Insight: Appropriate  Engagement in Group:  Engaged  Modes of Intervention:  Discussion  Additional Comments: The patient expressed that he rates today a 9.The patient also said that he attended groups and reach his goal to make small goals.  Octavio Mannshigpen, Kaleth Koy Lee 12/21/2017, 8:39 PM

## 2017-12-21 NOTE — Plan of Care (Signed)
  Problem: Education: Goal: Knowledge of [facility] Education information/materials will improve Outcome: Progressing Goal: Emotional status will improve Outcome: Progressing Goal: Mental status will improve Outcome: Progressing Goal: Verbalization of understanding the information provided will improve Outcome: Progressing   Problem: Safety: Goal: Periods of time without injury will increase Outcome: Adequate for Discharge   Problem: Activity: Goal: Interest or engagement in activities will improve Note:  Patient attending groups and participating.

## 2017-12-21 NOTE — Progress Notes (Signed)
D: Pt was in the hallway upon initial approach.  He presents with anxious affect and mood.  He describes his day as "good, I got to see my child."  His goal is to "get my laundry."  His speech is tangential.  Pt denies SI/HI and states "I have the best feeling in my life."  He is restless and paces the hallway periodically.  Pt denies hallucinations and pain.  He has been visible in milieu and he attended evening group.  A: Introduced self to pt.  Actively listened to pt and provided support and encouragement.  Medication administered per order.  PRN medication administered for anxiety and sleep.  Staff gave pt's visitor pt's car key from locker per pt's request.  Q15 minute safety checks maintained.  R: Pt is compliant with medications.  He verbally contracts for safety and reports he will inform staff of needs and concerns.  Will continue to monitor and assess.

## 2017-12-21 NOTE — Progress Notes (Signed)
Nursing note 7p-7a  Pt observed interacting with peers on unit this shift. Displayed a flat affect and anxious/ paranoid mood upon interaction with this Clinical research associatewriter. Pt denies pain in his great R toe. Pt is able to ambulate without issue. Pt denies SI/HI, but does endorse both audio and visual hallucinations at this time. Audio hallucinations: pt stated are telling him that the staff is trying to hurt him, his visual hallucinations: are flashes of lights or visions from past memories. Pt was redirected by this RN ensuring that he was safe here on the unit and staff is not here to harm him in any way. That we are here to care for him during his stay and to keep him safe. Pt was able to verbally contract for safety and verbalized understanding. Pt also complained of anxiety and insomnia, see MAR for PRN medication administration. Goal: to take a shower, wash his clothes, and brush his teeth. Pt is now resting in bed with eyes closed with no signs or symptoms of pain or distress noted. Pt continues to remain safe on the unit and is observed by rounding every 15 min. RN will continue to monitor.

## 2017-12-21 NOTE — Progress Notes (Signed)
Adult Psychoeducational Group Note  Date:  12/21/2017 Time:  12:28 AM  Group Topic/Focus:  Wrap-Up Group:   The focus of this group is to help patients review their daily goal of treatment and discuss progress on daily workbooks.  Participation Level:  Active  Participation Quality:  Appropriate  Affect:  Appropriate  Cognitive:  Appropriate  Insight: Appropriate  Engagement in Group:  Engaged  Modes of Intervention:  Discussion  Additional Comments: Pt stated his goal for today was to talk with his doctor about his discharge plan and work on his hygiene. Pt stated he was able to accomplished both of his goals. Pt rated his over day a 10. Pt stated that he attended all groups held today. Pt stated talking with his wife and father today help improve his day.  Felipa FurnaceChristopher  Jejuan Scala 12/21/2017, 12:28 AM

## 2017-12-21 NOTE — Progress Notes (Signed)
Patient ID: [name] ItoSimon Bramlett, male   DOB: [birth year], 37 y.o.   MRN: [id number] PER STATE REGULATIONS 482.30  THIS CHART WAS REVIEWED FOR MEDICAL NECESSITY WITH RESPECT TO THE PATIENT'S ADMISSION/ DURATION OF STAY.  NEXT REVIEW DATE:  12/25/2017 Willa RoughJENNIFER JONES Soundra Lampley, RN, BSN CASE MANAGER

## 2017-12-21 NOTE — Progress Notes (Signed)
[facility]  12/21/2017 2:15 PM [name] [name]  MRN:  [id number] Subjective:   [name] [name] is a 37 y/o M with history of schizophrenia who was admitted on IVC initiated by his sister with worsening agitation, disorganized behaviors, sending threatening texts to his family, and worsening use of methamphetamines. Pt was started on trial of zyprexa, and dose has been titrated up during his stay. He has been reporting incremental improvement of his presenting symptoms.  Today upon evaluation, pt shares, "It's going good." He remains slightly disorganized with his speech and he is mildly tangential, but he is able to be redirected during interview. He denies any specific concerns. He is sleeping well. His appetite is good. He reports some slight pain in his R great toe, but he is ambulating without difficulty. He denies other physical complaints. He reports AH which have improved overall, stating, "They are just slightly there and I pray about them which makes them better." He denies SI/HI/VH. Pt reports he is tolerating his medications well, and he is in agreement to continue his current regimen without changes. SW team obtained collateral from pt's family that they will not welcome him back at home without his completion of residential substance use treatment. Reviewed this with the patient, and while he was hesitant at first, he was in agreement after reviewing the potential benefits including ability to begin healing relationships with his wife and family. Pt is in agreement to continue his current regimen without changes and SW team will pursue referral to substance use treatment. Pt was in agreement with the above plan, and he had no further questions, comments, or concerns.   Principal Problem: Schizophrenia, paranoid type (HCC) Diagnosis:   Patient Active Problem List   Diagnosis Date Noted  . 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  Schizophrenia, paranoid type (HCC) [F20.0] 12/17/2017  . Schizoaffective disorder, bipolar type (HCC) [F25.0] 05/07/2016  . Cocaine abuse (HCC) [F14.10] 05/07/2016  . Cannabis use disorder, moderate, in sustained remission (HCC) [F12.21] 04/03/2015  . Alcohol use disorder, moderate, in sustained remission (HCC) [F10.21] 04/03/2015   Total Time spent with patient: 30 minutes  Past Psychiatric History: see H&P  Past Medical History:  Past Medical History:  Diagnosis Date  . Anxiety   . Depression   . Mental disorder   . Schizophrenia (HCC)    History reviewed. No pertinent surgical history. Family History:  Family History  Problem Relation Age of Onset  . Mental illness Neg Hx    Family Psychiatric  History: see H&P Social History:  Social History   Substance and Sexual Activity  Alcohol Use Not Currently   Comment: haven't drank in months"     Social History   Substance and Sexual Activity  Drug Use Yes  . Types: Marijuana, Methamphetamines   Comment: Last used: 2 weeks ago    Social History   Socioeconomic History  . Marital status: Single    Spouse name: Not on file  . Number of children: Not on file  . Years of education: Not on file  . Highest education level: Not on file  Occupational History  . Not on file  Social Needs  . Financial resource strain: Not on file  . Food insecurity:    Worry: Not on file    Inability: Not on file  . Transportation needs:    Medical: Not on file    Non-medical: Not on file  Tobacco Use  .  Smoking status: Current Every Day Smoker    Packs/day: 1.00    Types: Cigarettes  . Smokeless tobacco: Former Engineer, water and Sexual Activity  . Alcohol use: Not Currently    Comment: haven't drank in months"  . Drug use: Yes    Types: Marijuana, Methamphetamines    Comment: Last used: 2 weeks ago  . Sexual activity: Yes    Birth control/protection: None  Lifestyle  . Physical activity:    Days per week: Not on file     Minutes per session: Not on file  . Stress: Not on file  Relationships  . Social connections:    Talks on phone: Not on file    Gets together: Not on file    Attends religious service: Not on file    Active member of club or organization: Not on file    Attends meetings of clubs or organizations: Not on file    Relationship status: Not on file  Other Topics Concern  . Not on file  Social History Narrative  . Not on file   Additional Social History:                         Sleep: Good  Appetite:  Good  Current Medications: Current Facility-Administered Medications  Medication Dose Route Frequency Provider Last Rate Last Dose  . acetaminophen (TYLENOL) tablet 650 mg  650 mg Oral Q6H PRN Laveda Abbe, NP   650 mg at 12/17/17 2117  . alum & mag hydroxide-simeth (MAALOX/MYLANTA) 200-200-20 MG/5ML suspension 30 mL  30 mL Oral Q4H PRN Laveda Abbe, NP      . diphenhydrAMINE (BENADRYL) injection 50 mg  50 mg Intramuscular Q6H PRN Laveda Abbe, NP      . hydrOXYzine (ATARAX/VISTARIL) tablet 50 mg  50 mg Oral Q6H PRN Micheal Likens, MD   50 mg at 12/20/17 2101  . ibuprofen (ADVIL,MOTRIN) tablet 600 mg  600 mg Oral Q8H PRN Laveda Abbe, NP   600 mg at 12/20/17 1509  . LORazepam (ATIVAN) injection 2 mg  2 mg Intramuscular Q4H PRN Laveda Abbe, NP      . magnesium hydroxide (MILK OF MAGNESIA) suspension 30 mL  30 mL Oral Daily PRN Laveda Abbe, NP      . nicotine (NICODERM CQ - dosed in mg/24 hours) patch 21 mg  21 mg Transdermal Daily Oneta Rack, NP   21 mg at 12/21/17 0913  . OLANZapine (ZYPREXA) tablet 15 mg  15 mg Oral QHS Micheal Likens, MD   15 mg at 12/20/17 2100   And  . OLANZapine (ZYPREXA) tablet 5 mg  5 mg Oral Daily Micheal Likens, MD   5 mg at 12/21/17 0913  . ondansetron (ZOFRAN) tablet 4 mg  4 mg Oral Q8H PRN Laveda Abbe, NP      . traZODone (DESYREL) tablet 50 mg  50 mg  Oral QHS PRN Laveda Abbe, NP   50 mg at 12/20/17 2101    Lab Results: No results found for this or any previous visit (from the past 48 hour(s)).  Blood Alcohol level:  Lab Results  Component Value Date   ETH <10 12/16/2017   ETH <10 08/18/2017    Metabolic Disorder Labs: Lab Results  Component Value Date   HGBA1C 5.7 (H) 12/19/2017   MPG 116.89 12/19/2017   MPG 128 04/02/2015   Lab Results  Component Value Date  PROLACTIN 45.8 (H) 12/19/2017   PROLACTIN 7.0 04/04/2015   Lab Results  Component Value Date   CHOL 182 12/19/2017   TRIG 204 (H) 12/19/2017   HDL 30 (L) 12/19/2017   CHOLHDL 6.1 12/19/2017   VLDL 41 (H) 12/19/2017   LDLCALC 111 (H) 12/19/2017   LDLCALC 96 04/04/2015    Physical Findings: AIMS: Facial and Oral Movements Muscles of Facial Expression: None, normal Lips and Perioral Area: None, normal Jaw: None, normal Tongue: None, normal,Extremity Movements Upper (arms, wrists, hands, fingers): None, normal Lower (legs, knees, ankles, toes): None, normal, Trunk Movements Neck, shoulders, hips: None, normal, Overall Severity Severity of abnormal movements (highest score from questions above): None, normal Incapacitation due to abnormal movements: None, normal Patient's awareness of abnormal movements (rate only patient's report): No Awareness, Dental Status Current problems with teeth and/or dentures?: No Does patient usually wear dentures?: No  CIWA:  CIWA-Ar Total: 2 COWS:  COWS Total Score: 2  Musculoskeletal: Strength & Muscle Tone: within normal limits Gait & Station: normal Patient leans: N/A  Psychiatric Specialty Exam: Physical Exam  Nursing note and vitals reviewed.   Review of Systems  Constitutional: Negative for chills and fever.  Respiratory: Negative for cough and shortness of breath.   Cardiovascular: Negative for chest pain.  Gastrointestinal: Negative for abdominal pain, heartburn, nausea and vomiting.   Psychiatric/Behavioral: Positive for hallucinations. Negative for depression and suicidal ideas. The patient is not nervous/anxious and does not have insomnia.     Blood pressure 127/83, pulse 71, temperature (!) 97.4 F (36.3 C), temperature source Oral, resp. rate 16, height 5\' 3"  (1.6 m), weight 77.1 kg (169 lb 15.6 oz), SpO2 98 %.Body mass index is 30.11 kg/m.  General Appearance: Casual and Fairly Groomed  Eye Contact:  Good  Speech:  Clear and Coherent and Normal Rate  Volume:  Normal  Mood:  Euthymic  Affect:  Appropriate and Congruent  Thought Process:  Coherent and Goal Directed  Orientation:  Full (Time, Place, and Person)  Thought Content:  Hallucinations: Auditory  Suicidal Thoughts:  No  Homicidal Thoughts:  No  Memory:  Immediate;   Fair Recent;   Fair Remote;   Fair  Judgement:  Fair  Insight:  Lacking  Psychomotor Activity:  Normal  Concentration:  Concentration: Fair  Recall:  [employer] of Knowledge:  Fair  Language:  Fair  Akathisia:  No  Handed:    AIMS (if indicated):     Assets:  Resilience Social Support  ADL's:  Intact  Cognition:  WNL  Sleep:  Number of Hours: 6.75    Treatment Plan Summary: Daily contact with patient to assess and evaluate symptoms and progress in treatment and Medication management   -Continue inpatient hospitalization  -Schizophrenia             -Continue zyprexa 5mg  po qAM+15mg  po qhs  -Agitation                      -continue benadryl 50mg  IM q6h prn agitation                        -Continue ativan 2mg  IM q4h prn agitation  -Anxiety                        -Continue vistaril 50mg  po q6h prn anxiety  -Insomnia             -Continue trazodone 50mg   po qhs prn insomnia  -Encourage participation in groups and therapeutic milieu  -Disposition planning will be ongoing  Micheal Likens, MD 12/21/2017, 2:15 PM

## 2017-12-22 NOTE — Progress Notes (Signed)
[facility] MD Progress Note  12/22/2017 12:48 PM [name] [name]  MRN:  [id number]  Subjective: [name] [name] reports, "I have been here since Saturday. I was having suicidal/homicidal thoughts, hitting stuff, threatening people & was very angry. I was also using street drugs. Today, I'm feeling very good, I feel normal like myself again, relaxed. I'm no longer angry or ashamed. I saw my 27 months old daughter yesterday. My parents brought her to seem me. It was a good visit. I'm sleeping well. Still hearing voices telling me now to listen to what you got to tell me because it will help me. This is not a bad voice I'm currently hearing. It is positive. I have been attending group sessions. No side effects from the medicines".   [name] [name] is a 37 y/o M with history of schizophrenia who was admitted on IVC initiated by his sister with worsening agitation, disorganized behaviors, sending threatening texts to his family, and worsening use of methamphetamines. Pt was started on trial of zyprexa, and dose has been titrated up during his stay. He has been reporting incremental improvement of his presenting symptoms.  [name] [name] is seen, chart reviewed. The chart findings discussed with the treatment team. Today upon evaluation, pt shares, "I'm feeling, good, normal, relaxed not angry or ashamed." He presents with a good affect, good eye contact & verbally responsive. He says he had a good visit with his parents yesterday evening. They brought his 64 months old daughter to see him. He denies any specific concerns today. He is sleeping well. His appetite is good. He is visible on the unit. He denies any physical complaints. He reports AH which he says is saying something positive. He denies SI/HI/VH. Pt reports he is tolerating his medications well, and he is in agreement to continue his current regimen without changes. SW team had previously obtained collateral information from pt's family that they will not welcome him back at home without  his completion of residential substance use treatment. This has been reviewed with the patient, while he was hesitant at first, he was in agreement after reviewing the potential benefits including ability to begin healing relationships with his wife and family. Pt is in agreement to continue his current regimen without changes and SW team will pursue referral to substance use treatment. Pt was in agreement with the above plan, and he had no further questions, comments, or concerns.   Principal Problem: Schizophrenia, paranoid type (HCC) Diagnosis:   Patient Active Problem List   Diagnosis Date Noted  . 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 Schizophrenia, paranoid type (HCC) [F20.0] 12/17/2017  . Schizoaffective disorder, bipolar type (HCC) [F25.0] 05/07/2016  . Cocaine abuse (HCC) [F14.10] 05/07/2016  . Cannabis use disorder, moderate, in sustained remission (HCC) [F12.21] 04/03/2015  . Alcohol use disorder, moderate, in sustained remission (HCC) [F10.21] 04/03/2015   Total Time spent with patient: 15 minutes  Past Psychiatric History: See H&P  Past Medical History:  Past Medical History:  Diagnosis Date  . Anxiety   . Depression   . Mental disorder   . Schizophrenia (HCC)    History reviewed. No pertinent surgical history.  Family History:  Family History  Problem Relation Age of Onset  . Mental illness Neg Hx    Family Psychiatric  History: See H&P  Social History:  Social History   Substance and Sexual Activity  Alcohol Use Not Currently   Comment: haven't drank in months"  Social History   Substance and Sexual Activity  Drug Use Yes  . Types: Marijuana, Methamphetamines   Comment: Last used: 2 weeks ago    Social History   Socioeconomic History  . Marital status: Single    Spouse name: Not on file  . Number of children: Not on file  . Years of education: Not on file  . Highest education  level: Not on file  Occupational History  . Not on file  Social Needs  . Financial resource strain: Not on file  . Food insecurity:    Worry: Not on file    Inability: Not on file  . Transportation needs:    Medical: Not on file    Non-medical: Not on file  Tobacco Use  . Smoking status: Current Every Day Smoker    Packs/day: 1.00    Types: Cigarettes  . Smokeless tobacco: Former Engineer, water and Sexual Activity  . Alcohol use: Not Currently    Comment: haven't drank in months"  . Drug use: Yes    Types: Marijuana, Methamphetamines    Comment: Last used: 2 weeks ago  . Sexual activity: Yes    Birth control/protection: None  Lifestyle  . Physical activity:    Days per week: Not on file    Minutes per session: Not on file  . Stress: Not on file  Relationships  . Social connections:    Talks on phone: Not on file    Gets together: Not on file    Attends religious service: Not on file    Active member of club or organization: Not on file    Attends meetings of clubs or organizations: Not on file    Relationship status: Not on file  Other Topics Concern  . Not on file  Social History Narrative  . Not on file   Additional Social History:   Sleep: Good  Appetite:  Good  Current Medications: Current Facility-Administered Medications  Medication Dose Route Frequency Provider Last Rate Last Dose  . acetaminophen (TYLENOL) tablet 650 mg  650 mg Oral Q6H PRN Laveda Abbe, NP   650 mg at 12/17/17 2117  . alum & mag hydroxide-simeth (MAALOX/MYLANTA) 200-200-20 MG/5ML suspension 30 mL  30 mL Oral Q4H PRN Laveda Abbe, NP      . diphenhydrAMINE (BENADRYL) injection 50 mg  50 mg Intramuscular Q6H PRN Laveda Abbe, NP      . hydrOXYzine (ATARAX/VISTARIL) tablet 50 mg  50 mg Oral Q6H PRN Micheal Likens, MD   50 mg at 12/21/17 2109  . ibuprofen (ADVIL,MOTRIN) tablet 600 mg  600 mg Oral Q8H PRN Laveda Abbe, NP   600 mg at 12/20/17  1509  . LORazepam (ATIVAN) injection 2 mg  2 mg Intramuscular Q4H PRN Laveda Abbe, NP      . magnesium hydroxide (MILK OF MAGNESIA) suspension 30 mL  30 mL Oral Daily PRN Laveda Abbe, NP      . nicotine (NICODERM CQ - dosed in mg/24 hours) patch 21 mg  21 mg Transdermal Daily Oneta Rack, NP   21 mg at 12/21/17 0913  . OLANZapine (ZYPREXA) tablet 15 mg  15 mg Oral QHS Micheal Likens, MD   15 mg at 12/21/17 2109   And  . OLANZapine (ZYPREXA) tablet 5 mg  5 mg Oral Daily Micheal Likens, MD   5 mg at 12/22/17 0725  . ondansetron (ZOFRAN) tablet 4 mg  4 mg Oral Q8H  PRN Laveda AbbeParks, Laurie Britton, NP      . traZODone (DESYREL) tablet 50 mg  50 mg Oral QHS PRN Laveda AbbeParks, Laurie Britton, NP   50 mg at 12/21/17 2109   Lab Results: No results found for this or any previous visit (from the past 48 hour(s)).  Blood Alcohol level:  Lab Results  Component Value Date   ETH <10 12/16/2017   ETH <10 08/18/2017   Metabolic Disorder Labs: Lab Results  Component Value Date   HGBA1C 5.7 (H) 12/19/2017   MPG 116.89 12/19/2017   MPG 128 04/02/2015   Lab Results  Component Value Date   PROLACTIN 45.8 (H) 12/19/2017   PROLACTIN 7.0 04/04/2015   Lab Results  Component Value Date   CHOL 182 12/19/2017   TRIG 204 (H) 12/19/2017   HDL 30 (L) 12/19/2017   CHOLHDL 6.1 12/19/2017   VLDL 41 (H) 12/19/2017   LDLCALC 111 (H) 12/19/2017   LDLCALC 96 04/04/2015   Physical Findings: AIMS: Facial and Oral Movements Muscles of Facial Expression: None, normal Lips and Perioral Area: None, normal Jaw: None, normal Tongue: None, normal,Extremity Movements Upper (arms, wrists, hands, fingers): None, normal Lower (legs, knees, ankles, toes): None, normal, Trunk Movements Neck, shoulders, hips: None, normal, Overall Severity Severity of abnormal movements (highest score from questions above): None, normal Incapacitation due to abnormal movements: None, normal Patient's  awareness of abnormal movements (rate only patient's report): No Awareness, Dental Status Current problems with teeth and/or dentures?: No Does patient usually wear dentures?: No  CIWA:  CIWA-Ar Total: 2 COWS:  COWS Total Score: 2  Musculoskeletal: Strength & Muscle Tone: within normal limits Gait & Station: normal Patient leans: N/A  Psychiatric Specialty Exam: Physical Exam  Nursing note and vitals reviewed.   Review of Systems  Constitutional: Negative for chills and fever.  Respiratory: Negative for cough and shortness of breath.   Cardiovascular: Negative for chest pain.  Gastrointestinal: Negative for abdominal pain, heartburn, nausea and vomiting.  Psychiatric/Behavioral: Positive for hallucinations. Negative for depression and suicidal ideas. The patient is not nervous/anxious and does not have insomnia.     Blood pressure 131/87, pulse 79, temperature 98 F (36.7 C), temperature source Oral, resp. rate 18, height 5\' 3"  (1.6 m), weight 77.1 kg (169 lb 15.6 oz), SpO2 98 %.Body mass index is 30.11 kg/m.  General Appearance: Casual and Fairly Groomed  Eye Contact:  Good  Speech:  Clear and Coherent and Normal Rate  Volume:  Normal  Mood:  Euthymic  Affect:  Appropriate and Congruent  Thought Process:  Coherent and Goal Directed  Orientation:  Full (Time, Place, and Person)  Thought Content:  Hallucinations: Auditory  Suicidal Thoughts:  No  Homicidal Thoughts:  No  Memory:  Immediate;   Fair Recent;   Fair Remote;   Fair  Judgement:  Fair  Insight:  Lacking  Psychomotor Activity:  Normal  Concentration:  Concentration: Fair  Recall:  [employer]  Fund of Knowledge:  Fair  Language:  Fair  Akathisia:  No  Handed:    AIMS (if indicated):     Assets:  Resilience Social Support  ADL's:  Intact  Cognition:  WNL  Sleep:  Number of Hours: 6   Treatment Plan Summary: Daily contact with patient to assess and evaluate symptoms and progress in treatment and Medication  management   -Continue inpatient hospitalization.  -Will continue today 12/22/2017 plan as below except where it is noted.  -Schizophrenia             -  Continue Zyprexa 5 mg po Q AM.             - Continue Zyprexa 15mg  po qhs  -Agitation                      -continue benadryl 50mg  IM q6h prn agitation                        -Continue ativan 2mg  IM q4h prn agitation  -Anxiety                        -Continue Vistaril 50mg  po q6h prn anxiety  -Insomnia             -Continue trazodone 50mg  po qhs prn insomnia  -Encourage participation in groups and therapeutic milieu  -Disposition planning will be ongoing  [name] Stammer, NP, PMHNP, FNP-BC. 12/22/2017, 12:48 PMPatient ID: [name] [name], male   DOB: 1981/01/03, 37 y.o.   MRN: [id number]

## 2017-12-22 NOTE — Progress Notes (Signed)
Recreation Therapy Notes  Date: 6.19.19 Time: 1000 Location: 500 [address] Dayroom  Group Topic: Leisure Education, Goal Setting  Goal Area(s) Addresses:  Patient will be able to identify at least 3 life goals.  Patient will be able to identify benefit of investing in life goals.  Patient will be able to identify benefit of setting life goals.   Behavioral Response: Engaged  Intervention: Worksheet  Activity: Setting Life Goals.  Patients were to identify what they were doing well, what they needed to improve and set a goal in the areas of family, friends, spirituality, work/school, mental health and body.  Patients would then share their top 4 categories with the group.  Education: Discharge Planning, PharmacologistCoping Skills, Leisure Education  Education Outcome: Acknowledges Education/In Group Clarification Provided/Needs Additional Education  Clinical Observations: Pt arrived a little late to group.  Pt wasn't as hyper verbal.  Pt was engaged throughout group.  Pt identified his top 4 categories as follows: family- does well at protecting, providing, and keeping them safe by helping himself- stop complaining, using drugs and being jealous- set goal of making things better by creating healthy, touching moments in 28 days; spirituality- focus on God, ask for help and pray- improve on not giving up and letting go- goal of getting on a higher level with spirituality, position in church, talk to people lovingly day to day; body- doing well at eating right and exercise- improve not using negative things like drugs- goal of taking better care of self in 28 days; and mental health- does well at going to groups and solve problems at home- improve negative thoughts and being judgmental- goal of healing from past feelings of guilt everyday.     Caroll RancherMarjette Irina Okelly, LRT/CTRS      Caroll RancherLindsay, Renesmae Donahey A 12/22/2017 12:53 PM

## 2017-12-22 NOTE — Progress Notes (Signed)
The patient described his day as having been "alright" but was now complaining of pain in his toes. His goal for tomorrow is to get discharged since he misses his family a great deal.

## 2017-12-22 NOTE — Plan of Care (Signed)
  Problem: Education: Goal: Knowledge of [facility] [facility] Education information/materials will improve Outcome: Progressing   Problem: Education: Goal: Emotional status will improve Outcome: Progressing   Problem: Education: Goal: Mental status will improve Outcome: Progressing   Problem: Education: Goal: Verbalization of understanding the information provided will improve Outcome: Progressing   Problem: Activity: Goal: Interest or engagement in activities will improve Outcome: Progressing   Problem: Activity: Goal: Sleeping patterns will improve Outcome: Progressing   Problem: Coping: Goal: Ability to verbalize frustrations and anger appropriately will improve Outcome: Progressing

## 2017-12-22 NOTE — Progress Notes (Signed)
Patient self inventory- Patient slept well last night, sleep medication was requested and it did help. Appetite has been good, and energy level has been normal. Depression, hopelessness, and anxiety all rated 2 out of 10 with 10 being the highest. Patient endorses withdrawal symptoms such as cravings. Denies SI/HI/AVH. Endorses physical pain in the foot, heel, and toe; pain medication has been helpful. Patient said his goal is "healing in toe and heel."  Safety maintained with 15 minute checks as well as environmental checks. Patient compliant with medication administration as well as environmental checks. Support and encouragement given.

## 2017-12-22 NOTE — [facility] Group Notes (Signed)
LCSW Group Therapy Note  12/22/2017 1:15pm  Type of Therapy/Topic:  Group Therapy:  Balance in Life  Participation Level:  Active  Description of Group:    This group will address the concept of balance and how it feels and looks when one is unbalanced. Patients will be encouraged to process areas in their lives that are out of balance and identify reasons for remaining unbalanced. Facilitators will guide patients in utilizing problem-solving interventions to address and correct the stressor making their life unbalanced. Understanding and applying boundaries will be explored and addressed for obtaining and maintaining a balanced life. Patients will be encouraged to explore ways to assertively make their unbalanced needs known to significant others in their lives, using other group members and facilitator for support and feedback.  Therapeutic Goals: 1. Patient will identify two or more emotions or situations they have that consume much of in their lives. 2. Patient will identify signs/triggers that life has become out of balance:  3. Patient will identify two ways to set boundaries in order to achieve balance in their lives:  4. Patient will demonstrate ability to communicate their needs through discussion and/or role plays  Summary of Patient Progress:  Stayed the entire time, engaged throoughout.  Got confused about the difference between pain and anger, focusing on pain and how it makes him feel.  But did admit that his anger gets out of control, "and sometimes the police have to be called."  Was encouraged by another patient to come up with a daily routine when it became clear he has a lot of unoccupied time on his hands. Limited insight, though he did say that he thinks drug use makes it worse.    Therapeutic Modalities:   Cognitive Behavioral Therapy Solution-Focused Therapy Assertiveness Training  Ida RogueRodney B Jkwon [place], [place] 12/22/2017 3:16 PM

## 2017-12-23 MED ORDER — HYDROXYZINE HCL 50 MG PO TABS: 50.0000 mg | ORAL_TABLET | Freq: Four times a day (QID) | ORAL | 0 refills | Status: DC | PRN

## 2017-12-23 MED ORDER — NICOTINE 21 MG/24HR TD PT24: 21.0000 mg | MEDICATED_PATCH | Freq: Every day | TRANSDERMAL | 0 refills | Status: DC

## 2017-12-23 MED ORDER — TRAZODONE HCL 50 MG PO TABS: 50.0000 mg | ORAL_TABLET | Freq: Every evening | ORAL | 0 refills | Status: DC | PRN

## 2017-12-23 MED ORDER — OLANZAPINE 15 MG PO TABS: 15.0000 mg | ORAL_TABLET | Freq: Every day | ORAL | 0 refills | Status: DC

## 2017-12-23 MED ORDER — OLANZAPINE 5 MG PO TABS: 5.0000 mg | ORAL_TABLET | Freq: Every day | ORAL | 0 refills | Status: DC

## 2017-12-23 NOTE — Progress Notes (Signed)
  [facility]  Will you be returning to the same living situation after discharge:  Yes,  home At discharge, do you have transportation home?: Yes,  family Do you have the ability to pay for your medications: Yes,  MCD  Release of information consent forms completed and in the chart;  Patient's signature needed at discharge.  Patient to Follow up at: Follow-up Information    [facility] Follow up on 01/03/2018.   Why:  Monday at 1:00 with Dr Sande RivesMentistu for your hospital follow up appointment.  Please bring hospital d/c paperwork.  Also, you have accepted services with [facility], who can help with transportation or rescheduling appointments, [phone] Contact information: [address] [place] [address] [phone]        Services, [facility] Follow up on 12/29/2017.   Why:  Wednesday at 7:45AM for your screening for admission appointment.  Make sure to bring your MCD card and your filled prescriptions.  [phone] Contact information: [place] Jenkins[address] [place] [address] [phone]           Next level of care provider [facility] Link:no  Safety Planning and Suicide Prevention discussed: Yes,  yes  Have you used any form of tobacco in the last 30 days? (Cigarettes, Smokeless Tobacco, Cigars, and/or Pipes): Yes  Has patient been referred to the Quitline?: Patient refused referral  Patient has been referred for addiction treatment: Yes  Ida RogueRodney B Xia Stohr, LCSW 12/23/2017, 9:04 AM

## 2017-12-23 NOTE — Plan of Care (Signed)
  Problem: Education: Goal: Knowledge of  General Education information/materials will improve Outcome: Adequate for Discharge Goal: Emotional status will improve Outcome: Adequate for Discharge Goal: Mental status will improve Outcome: Adequate for Discharge Goal: Verbalization of understanding the information provided will improve Outcome: Adequate for Discharge   Problem: Activity: Goal: Interest or engagement in activities will improve Outcome: Adequate for Discharge Goal: Sleeping patterns will improve Outcome: Adequate for Discharge   Problem: Coping: Goal: Ability to verbalize frustrations and anger appropriately will improve Outcome: Adequate for Discharge Goal: Ability to demonstrate self-control will improve Outcome: Adequate for Discharge   Problem: Health Behavior/Discharge Planning: Goal: Identification of resources available to assist in meeting health care needs will improve Outcome: Adequate for Discharge Goal: Compliance with treatment plan for underlying cause of condition will improve Outcome: Adequate for Discharge   Problem: Physical Regulation: Goal: Ability to maintain clinical measurements within normal limits will improve Outcome: Adequate for Discharge   Problem: Safety: Goal: Periods of time without injury will increase Outcome: Adequate for Discharge   Problem: Activity: Goal: Will verbalize the importance of balancing activity with adequate rest periods Outcome: Adequate for Discharge   Problem: Education: Goal: Will be free of psychotic symptoms Outcome: Adequate for Discharge Goal: Knowledge of the prescribed therapeutic regimen will improve Outcome: Adequate for Discharge   Problem: Coping: Goal: Coping ability will improve Outcome: Adequate for Discharge Goal: Will verbalize feelings Outcome: Adequate for Discharge   Problem: Health Behavior/Discharge Planning: Goal: Compliance with prescribed medication regimen will  improve Outcome: Adequate for Discharge   Problem: Nutritional: Goal: Ability to achieve adequate nutritional intake will improve Outcome: Adequate for Discharge   Problem: Role Relationship: Goal: Ability to communicate needs accurately will improve Outcome: Adequate for Discharge Goal: Ability to interact with others will improve Outcome: Adequate for Discharge   Problem: Safety: Goal: Ability to redirect hostility and anger into socially appropriate behaviors will improve Outcome: Adequate for Discharge Goal: Ability to remain free from injury will improve Outcome: Adequate for Discharge   Problem: Self-Care: Goal: Ability to participate in self-care as condition permits will improve Outcome: Adequate for Discharge   Problem: Self-Concept: Goal: Will verbalize positive feelings about self Outcome: Adequate for Discharge   Problem: Activity: Goal: Will identify at least one activity in which they can participate Outcome: Adequate for Discharge   Problem: Coping: Goal: Ability to identify and develop effective coping behavior will improve Outcome: Adequate for Discharge Goal: Ability to interact with others will improve Outcome: Adequate for Discharge Goal: Demonstration of participation in decision-making regarding own care will improve Outcome: Adequate for Discharge Goal: Ability to use eye contact when communicating with others will improve Outcome: Adequate for Discharge   Problem: Health Behavior/Discharge Planning: Goal: Identification of resources available to assist in meeting health care needs will improve Outcome: Adequate for Discharge   Problem: Self-Concept: Goal: Will verbalize positive feelings about self Outcome: Adequate for Discharge

## 2017-12-23 NOTE — Discharge Summary (Addendum)
Physician Discharge Summary Note  Patient:  [name] [name] is an 37 y.o., male MRN:  [id number] DOB:  1981-03-12 Patient phone:  [phone] (home)  Patient address:   [address] [place] [address],   Total Time spent with patient: Greater than 30 minutes  Date of Admission:  12/17/2017  Date of Discharge: 12-23-17  Reason for Admission: Worsening agitation, disorganized behaviors, sending threatening texts to his family, and worsening use of methamphetamines.   Principal Problem: Schizophrenia, paranoid type [facility])  Discharge Diagnoses: Patient Active Problem List   Diagnosis Date Noted  . 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 Schizophrenia, paranoid type (HCC) [F20.0] 12/17/2017  . Schizoaffective disorder, bipolar type (HCC) [F25.0] 05/07/2016  . Cocaine abuse (HCC) [F14.10] 05/07/2016  . Cannabis use disorder, moderate, in sustained remission (HCC) [F12.21] 04/03/2015  . Alcohol use disorder, moderate, in sustained remission (HCC) [F10.21] 04/03/2015   Past Psychiatric History: Hx. Amphetamine use disorder, Schizophrenia, Cocaine use disorder, Cannabis & alcohol use disorder.  Past Medical History:  Past Medical History:  Diagnosis Date  . Anxiety   . Depression   . Mental disorder   . Schizophrenia (HCC)    History reviewed. No pertinent surgical history. Family History:  Family History  Problem Relation Age of Onset  . Mental illness Neg Hx    Family Psychiatric  History: See H&P.  Social History:  Social History   Substance and Sexual Activity  Alcohol Use Not Currently   Comment: haven't drank in months"     Social History   Substance and Sexual Activity  Drug Use Yes  . Types: Marijuana, Methamphetamines   Comment: Last used: 2 weeks ago    Social History   Socioeconomic History  . Marital status: Single    Spouse name: Not on file  . Number of children: Not on file  .  Years of education: Not on file  . Highest education level: Not on file  Occupational History  . Not on file  Social Needs  . Financial resource strain: Not on file  . Food insecurity:    Worry: Not on file    Inability: Not on file  . Transportation needs:    Medical: Not on file    Non-medical: Not on file  Tobacco Use  . Smoking status: Current Every Day Smoker    Packs/day: 1.00    Types: Cigarettes  . Smokeless tobacco: Former Engineer, water and Sexual Activity  . Alcohol use: Not Currently    Comment: haven't drank in months"  . Drug use: Yes    Types: Marijuana, Methamphetamines    Comment: Last used: 2 weeks ago  . Sexual activity: Yes    Birth control/protection: None  Lifestyle  . Physical activity:    Days per week: Not on file    Minutes per session: Not on file  . Stress: Not on file  Relationships  . Social connections:    Talks on phone: Not on file    Gets together: Not on file    Attends religious service: Not on file    Active member of club or organization: Not on file    Attends meetings of clubs or organizations: Not on file    Relationship status: Not on file  Other Topics Concern  . Not on file  Social History Narrative  . Not on file   Hospital Course: (Per Md's discharge SRA): Donte Lenzo is a 37 y/o M with  history of schizophrenia who was admitted on IVC initiated by his sister with worsening agitation, disorganized behaviors, sending threatening texts to his family, and worsening use of methamphetamines. He was known on this unit from previous hospitalization in this hospital with similar presentation. On this current hospitalization, Daymen was started on a trial of Zyprexa for his presenting symptoms & the dose has been titrated up during his stay to meet his needs. He had improvement of his presenting symptoms on daily basis throughout his hospital stay.  Besides the use of Zyprexa 5 mg daily, Zyprexa 15 mg Q hs both for mood control, Saahir  was also medicated & discharged on; Vistaril 50 mg prn for anxiety, Nicderm patch 21 mg for smoking cessation & Trazodone 50 mg prn for insomnia. He was also enrolled & participated in the group counseling sessions being offered & held on this unit. He learned coping skills that should help him to cope better to maintain mood stability & sobriety. He presented no other significant pre-existing issues that required treatment & or monitoring. He tolerated his treatment regimen without any adverse effects or reactions reported.  Today upon evaluation, ptshares, "I'm good - I'm getting a lot of sleep." He denies any specific concerns. He denies any physical complaints. He denies SI/HI/AH/VH. He is tolerating his medications well, and he is in agreement to continue his current regimen without changes. He plans to have outpatient follow up at [facility]. He will follow up at [facility] next week for substance use treatment. He is in agreement to attend NA meetings daily until he starts at [facility]. He was able to engage in safety planning including plan to return to [facility] or contact emergency services if he feels unable to maintain his own safety or the safety of others. Pt had no further questions, comments, or concerns.     Physical Findings: AIMS: Facial and Oral Movements Muscles of Facial Expression: None, normal Lips and Perioral Area: None, normal Jaw: None, normal Tongue: None, normal,Extremity Movements Upper (arms, wrists, hands, fingers): None, normal Lower (legs, knees, ankles, toes): None, normal, Trunk Movements Neck, shoulders, hips: None, normal, Overall Severity Severity of abnormal movements (highest score from questions above): None, normal Incapacitation due to abnormal movements: None, normal Patient's awareness of abnormal movements (rate only patient's report): No Awareness, Dental Status Current problems with teeth and/or dentures?: No Does patient usually wear dentures?: No  CIWA:   CIWA-Ar Total: 2 COWS:  COWS Total Score: 1  Musculoskeletal: Strength & Muscle Tone: within normal limits Gait & Station: normal Patient leans: N/A  Psychiatric Specialty Exam: Physical Exam  Constitutional: He appears well-developed.  HENT:  Head: Normocephalic.  Eyes: Pupils are equal, round, and reactive to light.  Neck: Normal range of motion.  Cardiovascular: Normal rate.  Respiratory: Effort normal.  GI: Soft.  Genitourinary:  Genitourinary Comments: Deferred  Musculoskeletal: Normal range of motion.  Neurological: He is alert.  Skin: Skin is warm.    Review of Systems  Constitutional: Negative.   HENT: Negative.   Eyes: Negative.   Respiratory: Negative.   Cardiovascular: Negative.   Gastrointestinal: Negative.   Genitourinary: Negative.   Musculoskeletal: Negative.   Skin: Negative.   Neurological: Negative.   Endo/Heme/Allergies: Negative.   Psychiatric/Behavioral: Positive for depression (Stabilized with medication prior to discharge), hallucinations (Hx. Psychosis (Stabilized with medication prior to discharge)) and substance abuse (Hx. Amphetamine use disorder). Negative for memory loss and suicidal ideas. The patient has insomnia (Stabilized with medication prior to discharge)). The patient  is not nervous/anxious.     Blood pressure 137/88, pulse 69, temperature 98.6 F (37 C), temperature source Oral, resp. rate 20, height 5\' 3"  (1.6 m), weight 77.1 kg (169 lb 15.6 oz), SpO2 98 %.Body mass index is 30.11 kg/m.  See Md's SRA   Have you used any form of tobacco in the last 30 days? (Cigarettes, Smokeless Tobacco, Cigars, and/or Pipes): Yes  Has this patient used any form of tobacco in the last 30 days? (Cigarettes, Smokeless Tobacco, Cigars, and/or Pipes):Yes, an FDA-approved tobacco cessation medication was offered at discharge.  Blood Alcohol level:  Lab Results  Component Value Date   ETH <10 12/16/2017   ETH <10 08/18/2017   Metabolic Disorder  Labs:  Lab Results  Component Value Date   HGBA1C 5.7 (H) 12/19/2017   MPG 116.89 12/19/2017   MPG 128 04/02/2015   Lab Results  Component Value Date   PROLACTIN 45.8 (H) 12/19/2017   PROLACTIN 7.0 04/04/2015   Lab Results  Component Value Date   CHOL 182 12/19/2017   TRIG 204 (H) 12/19/2017   HDL 30 (L) 12/19/2017   CHOLHDL 6.1 12/19/2017   VLDL 41 (H) 12/19/2017   LDLCALC 111 (H) 12/19/2017   LDLCALC 96 04/04/2015   See Psychiatric Specialty Exam and Suicide Risk Assessment completed by Attending Physician prior to discharge.  Discharge destination:  Home  Is patient on multiple antipsychotic therapies at discharge:  No   Has Patient had three or more failed trials of antipsychotic monotherapy by history:  No  Recommended Plan for Multiple Antipsychotic Therapies: NA  Allergies as of 12/23/2017   No Known Allergies     Medication List    STOP taking these medications   divalproex 500 MG 24 hr tablet Commonly known as:  DEPAKOTE ER   gabapentin 100 MG capsule Commonly known as:  NEURONTIN   mirtazapine 15 MG tablet Commonly known as:  REMERON   ziprasidone 80 MG capsule Commonly known as:  GEODON     TAKE these medications     Indication  hydrOXYzine 50 MG tablet Commonly known as:  ATARAX/VISTARIL Take 1 tablet (50 mg total) by mouth every 6 (six) hours as needed for anxiety. What changed:    medication strength  how much to take  when to take this  reasons to take this  Indication:  Feeling Anxious   nicotine 21 mg/24hr patch Commonly known as:  NICODERM CQ - dosed in mg/24 hours Place 1 patch (21 mg total) onto the skin daily. (May buy from over the counter): For smoking cessation Start taking on:  12/24/2017  Indication:  Nicotine Addiction   OLANZapine 15 MG tablet Commonly known as:  ZYPREXA Take 1 tablet (15 mg total) by mouth at bedtime. For mood control  Indication:  Mood control   OLANZapine 5 MG tablet Commonly known as:   ZYPREXA Take 1 tablet (5 mg total) by mouth daily. For mood control Start taking on:  12/24/2017  Indication:  Mood control   traZODone 50 MG tablet Commonly known as:  DESYREL Take 1 tablet (50 mg total) by mouth at bedtime as needed for sleep.  Indication:  Trouble Sleeping      Follow-up Information    [facility] Follow up on 01/03/2018.   Why:  Monday at 1:00 with Dr Sande Rives for your hospital follow up appointment.  Please bring hospital d/c paperwork.  Also, you have accepted services with [facility], who can help with transportation or rescheduling appointments,  [phone] Contact information: [address] [place] [address] [phone]        [facility] Follow up on 12/29/2017.   Why:  Wednesday at 7:45AM for your screening for admission appointment.  Make sure to bring your MCD card and your filled prescriptions.  [facility] [phone] Contact information: [place] [place] [address] [phone]          Follow-up recommendations: Activity:  As tolerated Diet: As recommended by your primary care doctor. Keep all scheduled follow-up appointments as recommended.  Comments: Patient is instructed prior to discharge to: Take all medications as prescribed by his/her mental healthcare provider. Report any adverse effects and or reactions from the medicines to his/her outpatient provider promptly. Patient has been instructed & cautioned: To not engage in alcohol and or illegal drug use while on prescription medicines. In the event of worsening symptoms, patient is instructed to call the crisis hotline, 911 and or go to the nearest ED for appropriate evaluation and treatment of symptoms. To follow-up with his/her primary care provider for your other medical issues, concerns and or health care needs.   Signed: Armandina StammerAgnes Nwoko, NP, PMHNP, FNP-BC 12/23/2017, 9:35 AM   Patient seen, Suicide  Assessment Completed.  Disposition Plan Reviewed

## 2017-12-23 NOTE — Progress Notes (Signed)
Recreation Therapy Notes  INPATIENT RECREATION TR PLAN  Patient Details Name: Eligh Rybacki MRN: [id number] DOB: 26-Sep-1980 Today's Date: 12/23/2017  Rec Therapy Plan Is patient appropriate for Therapeutic Recreation?: Yes Treatment times per week: about 3 days Estimated Length of Stay: 5-7 days TR Treatment/Interventions: Group participation (Comment)  Discharge Criteria Pt will be discharged from therapy if:: Discharged Treatment plan/goals/alternatives discussed and agreed upon by:: Patient/family  Discharge Summary Short term goals set: See patient care plan Short term goals met: Complete Progress toward goals comments: Groups attended Which groups?: Communication, Stress management, Goal setting, Other (Comment)(Triggers) Reason goals not met: None Therapeutic equipment acquired: N/A Reason patient discharged from therapy: Discharge from hospital Pt/family agrees with progress & goals achieved: Yes Date patient discharged from therapy: 12/23/17    Victorino Sparrow, LRT/CTRS  Ria Comment, Joao Mccurdy A 12/23/2017, 10:37 AM

## 2017-12-23 NOTE — Tx Team (Signed)
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 acetaminophen (TYLENOL) tablet 650 mg  650 mg Oral Q6H PRN Ethelene Hal, NP   650 mg at 12/17/17 2117  . alum & mag hydroxide-simeth (MAALOX/MYLANTA) 200-200-20 MG/5ML suspension 30 mL  30 mL Oral Q4H PRN Ethelene Hal, NP      . diphenhydrAMINE (BENADRYL) injection 50 mg  50 mg Intramuscular Q6H PRN Ethelene Hal, NP      . hydrOXYzine (ATARAX/VISTARIL) tablet 50 mg  50 mg Oral Q6H PRN Pennelope Bracken, MD   50 mg at 12/21/17 2109  . ibuprofen (ADVIL,MOTRIN) tablet 600 mg  600 mg Oral Q8H PRN Ethelene Hal, NP   600 mg at 12/20/17 1509  . LORazepam (ATIVAN) injection 2 mg  2 mg Intramuscular Q4H PRN Ethelene Hal, NP      . magnesium hydroxide (MILK OF MAGNESIA) suspension 30 mL  30 mL Oral Daily PRN Ethelene Hal, NP      . nicotine (NICODERM CQ - dosed in mg/24 hours) patch 21 mg  21 mg Transdermal Daily Derrill Center, NP   21 mg at 12/21/17 0913  . OLANZapine (ZYPREXA) tablet 15 mg  15 mg Oral QHS Pennelope Bracken, MD   15 mg at 12/22/17 2105   And  . OLANZapine (ZYPREXA) tablet 5 mg  5 mg Oral Daily Pennelope Bracken, MD   5 mg at 12/23/17 0817  . ondansetron (ZOFRAN) tablet 4 mg  4 mg Oral Q8H PRN Ethelene Hal, NP      . traZODone (DESYREL) tablet 50 mg  50 mg Oral QHS PRN Ethelene Hal, NP   50 mg at 12/22/17 2105    PTA Medications: Medications Prior to Admission  Medication Sig Dispense Refill Last  Dose  . divalproex (DEPAKOTE ER) 500 MG 24 hr tablet Take 1,000 mg by mouth at bedtime.    Past Week at Unknown time  . gabapentin (NEURONTIN) 100 MG capsule Take 2 capsules (200 mg total) by mouth 2 (two) times daily. (Patient not taking: Reported on 08/19/2017) 120 capsule 1 Not Taking at Unknown time  . hydrOXYzine (ATARAX/VISTARIL) 25 MG tablet Take 25 mg by mouth 3 (three) times daily.  0 Past Week at Unknown time  . mirtazapine (REMERON) 15 MG tablet Take 15 mg by mouth at bedtime.   Past Week at Unknown time  . ziprasidone (GEODON) 80 MG capsule Take 80 mg by mouth 2 (two) times daily with a meal.    Past Week at Unknown time    Patient Stressors: Marital or family conflict Substance abuse  Patient Strengths: Average or above average intelligence General fund of knowledge Supportive family/friends  Treatment Modalities: Medication Management, Group therapy, Case management,  1 to 1 session with clinician, Psychoeducation, Recreational therapy.   Physician Treatment Plan for Primary Diagnosis: Schizophrenia, paranoid type ([place]) Long Term Goal(s): Improvement in symptoms so as ready for discharge  Short Term Goals: Ability to identify changes in lifestyle to reduce recurrence of condition will improve Ability to verbalize feelings will improve Ability to identify and develop effective coping behaviors will  improve Ability to disclose and discuss suicidal ideas Ability to demonstrate self-control will improve Ability to identify and develop effective coping behaviors will improve Ability to identify triggers associated with substance abuse/mental health issues will improve  Medication Management: Evaluate patient's response, side effects, and tolerance of medication regimen.  Therapeutic Interventions: 1 to 1 sessions, Unit Group sessions and Medication administration.  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 ([place])   Long Term Goal(s): Improvement in symptoms so as ready for discharge  Short Term Goals: Ability to identify changes in lifestyle to reduce recurrence of condition will improve Ability to verbalize feelings will improve Ability to identify and develop effective coping behaviors will improve Ability to disclose and discuss suicidal ideas Ability to demonstrate self-control will improve Ability to identify and develop effective coping behaviors will improve Ability to identify triggers associated with substance abuse/mental health issues will improve  Medication Management: Evaluate patient's response, side effects, and tolerance of medication regimen.  Therapeutic Interventions: 1 to 1 sessions, Unit Group sessions and Medication administration.  Evaluation of Outcomes: Adequate for Discharge   RN Treatment Plan for Primary Diagnosis: Schizophrenia, paranoid type ([place]) Long Term Goal(s): Knowledge of disease and therapeutic regimen to maintain health will improve  Short Term Goals: Ability to identify and develop effective coping behaviors will improve and Compliance with prescribed medications will improve  Medication Management: RN will administer medications as ordered by provider, will assess and evaluate patient's response and provide education to patient for prescribed medication. RN will report any adverse and/or side effects to prescribing provider.  Therapeutic Interventions: 1 on 1 counseling sessions, Psychoeducation, Medication administration, Evaluate responses to treatment, Monitor vital signs and CBGs as ordered, Perform/monitor CIWA, COWS, AIMS and Fall Risk screenings as ordered, Perform wound care treatments as ordered.  Evaluation of Outcomes: Adequate for Discharge   LCSW Treatment Plan for Primary Diagnosis:  Schizophrenia, paranoid type ([place]) Long Term Goal(s): Safe transition to appropriate next level of care at discharge, Engage patient in therapeutic group addressing interpersonal concerns.  Short Term Goals: Engage patient in aftercare planning with referrals and resources  Therapeutic Interventions: Assess for all discharge needs, 1 to 1 time with Social worker, Explore available resources and support systems, Assess for adequacy in community support network, Educate family and significant other(s) on suicide prevention, Complete Psychosocial Assessment, Interpersonal group therapy.  Evaluation of Outcomes: Met  Return home, follow up [facility], [facility]   Progress in Treatment: Attending groups: Yes Participating in groups: Yes Taking medication as prescribed: Yes Toleration medication: Yes, no side effects reported at this time Family/Significant other contact made: No Patient understands diagnosis: Yes AEB asking for help with confusion and substance use Discussing patient identified problems/goals with staff: Yes Medical problems stabilized or resolved: Yes Denies suicidal/homicidal ideation: Yes Issues/concerns per patient self-inventory: None Other: N/A  New problem(s) identified: None identified at this time.   New Short Term/Long Term Goal(s): "I want to get on the right meds, get better sleep, get out ASAP and get my addiction under control."   Discharge Plan or Barriers:   Reason for Continuation of Hospitalization:    Estimated Length of Stay: D/C today  Attendees: Patient:  12/23/2017  9:18 AM  Physician: Maris Berger, MD 12/23/2017  9:18 AM  Nursing: Megan Mans, RN 12/23/2017  9:18 AM  RN Care  Manager: Lars Pinks, RN 12/23/2017  9:18 AM  Social Worker: Ripley Fraise 12/23/2017  9:18 AM  Recreational Therapist: Winfield Cunas 12/23/2017  9:18 AM  Other: Norberto Sorenson 12/23/2017  9:18 AM  Other:  12/23/2017  9:18 AM    Scribe for Treatment  Team:  Roque Lias LCSW 12/23/2017 9:18 AM

## 2017-12-23 NOTE — [facility] Suicide Risk Assessment (Signed)
[facility] Discharge Suicide Risk Assessment   Principal Problem: Schizophrenia, paranoid type [facility]) Discharge Diagnoses:  Patient Active Problem List   Diagnosis Date Noted  . 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 Schizophrenia, paranoid type (HCC) [F20.0] 12/17/2017  . Schizoaffective disorder, bipolar type (HCC) [F25.0] 05/07/2016  . Cocaine abuse (HCC) [F14.10] 05/07/2016  . Cannabis use disorder, moderate, in sustained remission (HCC) [F12.21] 04/03/2015  . Alcohol use disorder, moderate, in sustained remission (HCC) [F10.21] 04/03/2015    Total Time spent with patient: 30 minutes  Musculoskeletal: Strength & Muscle Tone: within normal limits Gait & Station: normal Patient leans: N/A  Psychiatric Specialty Exam: Review of Systems  Constitutional: Negative for chills and fever.  Respiratory: Negative for cough and shortness of breath.   Cardiovascular: Negative for chest pain.  Gastrointestinal: Negative for abdominal pain, heartburn, nausea and vomiting.  Psychiatric/Behavioral: Negative for depression, hallucinations and suicidal ideas. The patient is not nervous/anxious and does not have insomnia.     Blood pressure 137/88, pulse 69, temperature 98.6 F (37 C), temperature source Oral, resp. rate 20, height 5\' 3"  (1.6 m), weight 77.1 kg (169 lb 15.6 oz), SpO2 98 %.Body mass index is 30.11 kg/m.  General Appearance: Casual and Fairly Groomed  Patent attorney::  Good  Speech:  Clear and Coherent and Normal Rate  Volume:  Normal  Mood:  Euthymic  Affect:  Appropriate and Congruent  Thought Process:  Coherent and Goal Directed  Orientation:  Full (Time, Place, and Person)  Thought Content:  Logical  Suicidal Thoughts:  No  Homicidal Thoughts:  No  Memory:  Immediate;   Fair Recent;   Fair Remote;   Fair  Judgement:  Fair  Insight:  Fair  Psychomotor Activity:  Normal  Concentration:  Fair  Recall:  [employer] of Knowledge:Fair  Language: Fair  Akathisia:  No  Handed:    AIMS (if indicated):     Assets:  Communication Skills Desire for Improvement Housing Physical Health Resilience Social Support  Sleep:  Number of Hours: 6.75  Cognition: WNL  ADL's:  Intact   Mental Status Per Nursing Assessment::   On Admission:  Suicidal ideation indicated by others, Self-harm behaviors, Plan to harm others  Demographic Factors:  Male, Low socioeconomic status and Unemployed  Loss Factors: Financial problems/change in socioeconomic status  Historical Factors: Impulsivity  Risk Reduction Factors:   Responsible for children under 4 years of age, Sense of responsibility to family, Positive social support, Positive therapeutic relationship and Positive coping skills or problem solving skills  Continued Clinical Symptoms:  Severe Anxiety and/or Agitation Alcohol/Substance Abuse/Dependencies Schizophrenia:   Paranoid or undifferentiated type  Cognitive Features That Contribute To Risk:  Closed-mindedness    Suicide Risk:  Minimal: No identifiable suicidal ideation.  Patients presenting with no risk factors but with morbid ruminations; may be classified as minimal risk based on the severity of the depressive symptoms  Follow-up Information    [facility] Follow up on 01/03/2018.   Why:  Monday at 1:00 with Dr Sande Rives for your hospital follow up appointment.  Please bring hospital d/c paperwork.  Also, you have accepted services with [facility], who can help with transportation or rescheduling appointments, [phone] Contact information: [address] [place] [address] [phone]        Services, [facility] Follow up on 12/29/2017.   Why:  Wednesday at 7:45AM for your screening for admission appointment.  Make sure  to bring your MCD card and your filled prescriptions.  [facility] a resource when [facility] [phone] Contact information: [address] [place] [address] [phone]         Subjective Data: [name] [name] is a 37 y/o M with history of schizophrenia who was admitted on IVC initiated by his sister with worsening agitation, disorganized behaviors, sending threatening texts to his family, and worsening use of methamphetamines. Pt was started on trial of zyprexa, and dose has been titrated up during his stay. He had improvement of his presenting symptoms.  Today upon evaluation, pt shares, "I'm good - I'm getting a lot of sleep." He denies any specific concerns. He denies any physical complaints. He denies SI/HI/AH/VH. He is tolerating his medications well, and he is in agreement to continue his current regimen without changes. He [facility] for substance use treatment. He is in agreement to attend NA meetings daily until [facility]. He was able to engage in safety planning including plan [facility] or contact emergency services if he feels unable to maintain his own safety or the safety of others. Pt had no further questions, comments, or concerns.   Plan Of Care/Follow-up recommendations:   -Discharge to outpatient level of care  -Schizophrenia -Continue Zyprexa 5 mg po Q AM.             - Continue Zyprexa 15mg  po qhs  -Anxiety -Continue Vistaril 50mg  po q6h prn anxiety  -Insomnia -Continue trazodone 50mg  po qhs prn insomnia  Activity:  as tolerated Diet:  normal Tests:  NA Other:  see above for DC plan  Micheal Likenshristopher T Meosha Castanon, MD 12/23/2017, 9:53 AM

## 2017-12-23 NOTE — Progress Notes (Signed)
Patient self inventory- Patient slept well last night. Sleep medication was requested and it helped. Appetite has been fair, energy level normal, concentration good. Depression, anxiety, and hopelessness all rated 0 out of 10. Denies withdrawal symptoms, denies SI/HI/AVH. Endorses physical pain in his toe, but could not rate. Patient's goal is to work on a "release plan." Patient compliant with medication administration. Safety maintained with 15 minute checks as well as environmental checks. Will continue to monitor.

## 2017-12-23 NOTE — Plan of Care (Signed)
Pt was able to identify healthy alternatives to unhealthy habits at completion of recreation therapy group sessions.   Caroll RancherMarjette Sanad Fearnow, LRT/CTRS

## 2017-12-23 NOTE — Progress Notes (Signed)
Pt in dayroom interacting.  [facility]"  Pt seems happy and cooperative.   Pt is med compliant.  Pt attends group and participates.  Pt denies SI, HI and AVH.  Pt contracts for safety at this time.

## 2017-12-23 NOTE — Progress Notes (Signed)
Recreation Therapy Notes  INPATIENT RECREATION THERAPY ASSESSMENT  Patient Details Name: [name] [name] MRN: [id number] DOB: [birth year] Today's Date: 12/23/2017       Information Obtained From: Patient  Able to Participate in Assessment/Interview: Yes  Patient Presentation: Alert, Oriented, Hyperverbal  Reason for Admission (Per Patient): Other (Comments)(Afraid, angry and withdrawing from drugs)  Patient Stressors: Friends(Hanging around them)  Coping Skills:   Isolation, Write, Sports, TV, Music, Exercise, Meditate, Deep Breathing, Substance Abuse, Talk, Art, Prayer, Avoidance, Hot Bath/Shower  Leisure Interests (2+):  Social - Family  Frequency of Recreation/Participation: Other (Comment)(Daily)  Awareness of Community Resources:  Yes  Community Resources:  Restaurants, Park, Engineering geologistLibrary  Current Use: Yes  If no, Barriers?:    Expressed Interest in [employer]Community Resource Information: Yes  [place] of Residence:  [place]  Patient Main Form of Transportation: Car  Patient Strengths:  Stay positive; Loves family  Patient Identified Areas of Improvement:  Control thoughts; Do good  Patient Goal for Hospitalization:  "Get out of here as soon as possible"  Current SI (including self-harm):  No  Current HI:  No  Current AVH: No  Staff Intervention Plan: Group Attendance, Collaborate with Interdisciplinary Treatment Team  Consent to Intern Participation: N/A   Caroll RancherMarjette Shirell Struthers, LRT/CTRS   Caroll RancherLindsay, Manda Holstad A 12/23/2017, 10:33 AM

## 2017-12-23 NOTE — Progress Notes (Signed)
Recreation Therapy Notes  Date: 6.20.19 Time: 1000 Location: 500 Hall Dayroom  Group Topic: Stress Management  Goal Area(s) Addresses:  Patient will verbalize importance of using healthy stress management.  Patient will identify positive emotions associated with healthy stress management.   Behavioral Response: Engaged  Intervention: Stress Management  Activity :  LRT introduced the stress management technique of meditation to patients.  LRT spoke with patient about how meditation in conjunction with deep breathing can help to relieve stress and bring on a sense of calm.  LRT then played a meditation that allowed patients to focus on positive thoughts and let their breathing guide them.  Education:  Stress Management, Discharge Planning.   Education Outcome: Acknowledges edcuation/In group clarification offered/Needs additional education  Clinical Observations/Feedback: Pt stated meditation helps you relax and sleep.  Pt also stated that deep breathing helps you relax as well.  Pt went on to demonstrate for the group how to do deep breathing.  After the meditation, pt stated he felt "positive" and his thoughts were positive.    Caroll RancherMarjette Alain Deschene, LRT/CTRS    Lillia AbedLindsay, Lynessa Almanzar A 12/23/2017 10:38 AM

## 2018-07-16 ENCOUNTER — Emergency Department (HOSPITAL_COMMUNITY)
Admission: EM | Admit: 2018-07-16 | Discharge: 2018-07-18 | Disposition: A | Discharge status: Other Acute Inpatient Hospital | Payer: Medicare Other | Attending: Emergency Medicine | Admitting: Emergency Medicine

## 2018-07-16 ENCOUNTER — Encounter (HOSPITAL_COMMUNITY): Payer: Self-pay | Admitting: Emergency Medicine

## 2018-07-16 DIAGNOSIS — F1410 Cocaine abuse, uncomplicated: Secondary | ICD-10-CM

## 2018-07-16 DIAGNOSIS — R45851 Suicidal ideations: Secondary | ICD-10-CM | POA: Insufficient documentation

## 2018-07-16 DIAGNOSIS — Z79899 Other long term (current) drug therapy: Secondary | ICD-10-CM | POA: Insufficient documentation

## 2018-07-16 DIAGNOSIS — F250 Schizoaffective disorder, bipolar type: Secondary | ICD-10-CM

## 2018-07-16 DIAGNOSIS — F17210 Nicotine dependence, cigarettes, uncomplicated: Secondary | ICD-10-CM | POA: Insufficient documentation

## 2018-07-16 DIAGNOSIS — F209 Schizophrenia, unspecified: Secondary | ICD-10-CM | POA: Insufficient documentation

## 2018-07-16 LAB — COMPREHENSIVE METABOLIC PANEL
ALK PHOS: 64 U/L (ref 38–126)
ALT: 67 U/L — AB (ref 0–44)
AST: 31 U/L (ref 15–41)
Albumin: 5.2 g/dL — ABNORMAL HIGH (ref 3.5–5.0)
Anion gap: 10 (ref 5–15)
BILIRUBIN TOTAL: 0.9 mg/dL (ref 0.3–1.2)
BUN: 14 mg/dL (ref 6–20)
CALCIUM: 9.4 mg/dL (ref 8.9–10.3)
CO2: 23 mmol/L (ref 22–32)
CREATININE: 0.97 mg/dL (ref 0.61–1.24)
Chloride: 104 mmol/L (ref 98–111)
GFR calc Af Amer: >60 mL/min (ref >60)
GFR calc non Af Amer: >60 mL/min (ref >60)
Glucose, Bld: 114 mg/dL — ABNORMAL HIGH (ref 70–99)
Potassium: 3.8 mmol/L (ref 3.5–5.1)
Sodium: 137 mmol/L (ref 135–145)
TOTAL PROTEIN: 8.8 g/dL — AB (ref 6.5–8.1)

## 2018-07-16 LAB — CBC
HEMATOCRIT: 48 % (ref 39.0–52.0)
Hemoglobin: 15.2 g/dL (ref 13.0–17.0)
MCH: 25.6 pg — ABNORMAL LOW (ref 26.0–34.0)
MCHC: 31.7 g/dL (ref 30.0–36.0)
MCV: 80.8 fL (ref 80.0–100.0)
NRBC: 0 % (ref 0.0–0.2)
Platelets: 290 10*3/uL (ref 150–400)
RBC: 5.94 MIL/uL — AB (ref 4.22–5.81)
RDW: 13 % (ref 11.5–15.5)
WBC: 8.3 10*3/uL (ref 4.0–10.5)

## 2018-07-16 LAB — SALICYLATE LEVEL: Salicylate Lvl: <7 mg/dL (ref 2.8–30.0)

## 2018-07-16 LAB — ETHANOL: Alcohol, Ethyl (B): <10 mg/dL (ref <10)

## 2018-07-16 LAB — ACETAMINOPHEN LEVEL: Acetaminophen (Tylenol), Serum: <10 ug/mL — ABNORMAL LOW (ref 10–30)

## 2018-07-16 MED ORDER — HALOPERIDOL 5 MG PO TABS
5.0000 mg | ORAL_TABLET | Freq: Two times a day (BID) | ORAL | Status: DC
  Administered 2018-07-16 – 2018-07-18 (×5): 5 mg via ORAL
  Filled 2018-07-16 (×5): qty 1

## 2018-07-16 MED ORDER — ZIPRASIDONE MESYLATE 20 MG IM SOLR
10.0000 mg | Freq: Once | INTRAMUSCULAR | Status: AC
  Administered 2018-07-16: 10 mg via INTRAMUSCULAR
  Filled 2018-07-16: qty 20

## 2018-07-16 MED ORDER — STERILE WATER FOR INJECTION IJ SOLN
INTRAMUSCULAR | Status: AC
  Administered 2018-07-16: 1 mL
  Filled 2018-07-16: qty 10

## 2018-07-16 NOTE — ED Notes (Signed)
Patient noted with two one1 dollar bills and nineteen twenty dollar bills. Pt also noted with a black wallet with a mastercard in it. Pt items locked up with security.

## 2018-07-16 NOTE — ED Notes (Signed)
[employer] in room. Patient has had cuffs removed.

## 2018-07-16 NOTE — ED Notes (Signed)
Patient has been changed out in [place] by security.

## 2018-07-16 NOTE — ED Notes (Signed)
Patient voluntarily allowed RN to give Geodon Shot without resisting or Warden/ranger.

## 2018-07-16 NOTE — BH Assessment (Signed)
[facility] Assessment Progress Note Case was staffed with Lord DNP who recommended a inpatient admission to assist with stabilization.       

## 2018-07-16 NOTE — ED Triage Notes (Signed)
Patient here via [employer] IVC'd by sister with complaints of auditory hallucinations. Delusions that ghost are in the home and believes that medications are going to kill him. Threatening family. Using meth.

## 2018-07-16 NOTE — ED Notes (Signed)
Patient voluntarily allowed RN to attain Blood for analysis and also changed into [place] scrubs voluntarily.

## 2018-07-16 NOTE — ED Provider Notes (Signed)
[facility]-EMERGENCY DEPT Provider Note   CSN: [id number] Arrival date & time: 07/16/18  1413     History   Chief Complaint Chief Complaint  Patient presents with  . Aggressive Behavior  . IVC    HPI [name] [name] is a 38 y.o. male.  Patient with hx schizoaffective disorder, presents via gpd with ivc papers. Per report, pt abusing meth, not compliant w meds, with paranoid and delusional thoughts, hallucinating, and having suicidal thoughts. Pt indicates ghosts and devils are trying to kill him. Pt very poor historian - level 5 caveat - psychosis.   The history is provided by the patient and the police. The history is limited by the condition of the patient.    Past Medical History:  Diagnosis Date  . Anxiety   . Depression   . Mental disorder   . Schizophrenia [facility])     Patient Active Problem List   Diagnosis Date Noted  . 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 Schizophrenia, paranoid type (HCC) 12/17/2017  . Schizoaffective disorder, bipolar type (HCC) 05/07/2016  . Cocaine abuse (HCC) 05/07/2016  . Cannabis use disorder, moderate, in sustained remission (HCC) 04/03/2015  . Alcohol use disorder, moderate, in sustained remission (HCC) 04/03/2015    History reviewed. No pertinent surgical history.      Home Medications    Prior to Admission medications   Medication Sig Start Date End Date Taking? Authorizing Provider  hydrOXYzine (ATARAX/VISTARIL) 50 MG tablet Take 1 tablet (50 mg total) by mouth every 6 (six) hours as needed for anxiety. 12/23/17   Armandina Stammer I, NP  nicotine (NICODERM CQ - DOSED IN MG/24 HOURS) 21 mg/24hr patch Place 1 patch (21 mg total) onto the skin daily. (May buy from over the counter): For smoking cessation 12/24/17   Armandina Stammer I, NP  OLANZapine (ZYPREXA) 15 MG tablet Take 1 tablet (15 mg total) by mouth at bedtime. For mood control 12/23/17   Nwoko, Nicole Kindred I, NP    OLANZapine (ZYPREXA) 5 MG tablet Take 1 tablet (5 mg total) by mouth daily. For mood control 12/24/17   Armandina Stammer I, NP  traZODone (DESYREL) 50 MG tablet Take 1 tablet (50 mg total) by mouth at bedtime as needed for sleep. 12/23/17   Sanjuana Kava, NP    Family History Family History  Problem Relation Age of Onset  . Mental illness Neg Hx     Social History Social History   Tobacco Use  . Smoking status: Current Every Day Smoker    Packs/day: 1.00    Types: Cigarettes  . Smokeless tobacco: Former Engineer, water Use Topics  . Alcohol use: Not Currently    Comment: haven't drank in months"  . Drug use: Yes    Types: Marijuana, Methamphetamines    Comment: Last used: 2 weeks ago     Allergies   Patient has no known allergies.   Review of Systems Review of Systems  Unable to perform ROS: Psychiatric disorder  level 5 caveat - acute psychosis.    Physical Exam Updated Vital Signs There were no vitals taken for this visit.  Physical Exam Vitals signs and nursing note reviewed.  Constitutional:      Appearance: Normal appearance. He is well-developed.  HENT:     Head: Atraumatic.     Nose: Nose normal.     Mouth/Throat:     Mouth: Mucous membranes are moist.     Pharynx: Oropharynx  is clear.  Eyes:     General: No scleral icterus.    Conjunctiva/sclera: Conjunctivae normal.     Pupils: Pupils are equal, round, and reactive to light.  Neck:     Musculoskeletal: Normal range of motion and neck supple. No neck rigidity.     Trachea: No tracheal deviation.  Cardiovascular:     Rate and Rhythm: Normal rate and regular rhythm.     Pulses: Normal pulses.     Heart sounds: Normal heart sounds. No murmur. No friction rub. No gallop.   Pulmonary:     Effort: Pulmonary effort is normal. No accessory muscle usage or respiratory distress.     Breath sounds: Normal breath sounds.  Abdominal:     General: Bowel sounds are normal. There is no distension.      Palpations: Abdomen is soft.     Tenderness: There is no abdominal tenderness. There is no guarding.  Genitourinary:    Comments: No cva tenderness. Musculoskeletal:        General: No swelling.  Skin:    General: Skin is warm and dry.     Findings: No rash.  Neurological:     Mental Status: He is alert.     Comments: Alert, speech clear. Steady gait.   Psychiatric:     Comments: Patient paranoid, delusional, hearing voices. +SI.       ED Treatments / Results  Labs (all labs ordered are listed, but only abnormal results are displayed) Results for orders placed or performed during the hospital encounter of 07/16/18  CBC  Result Value Ref Range   WBC 8.3 4.0 - 10.5 K/uL   RBC 5.94 (H) 4.22 - 5.81 MIL/uL   Hemoglobin 15.2 13.0 - 17.0 g/dL   HCT 10.2 58.5 - 27.7 %   MCV 80.8 80.0 - 100.0 fL   MCH 25.6 (L) 26.0 - 34.0 pg   MCHC 31.7 30.0 - 36.0 g/dL   RDW 82.4 23.5 - 36.1 %   Platelets 290 150 - 400 K/uL   nRBC 0.0 0.0 - 0.2 %  Comprehensive metabolic panel  Result Value Ref Range   Sodium 137 135 - 145 mmol/L   Potassium 3.8 3.5 - 5.1 mmol/L   Chloride 104 98 - 111 mmol/L   CO2 23 22 - 32 mmol/L   Glucose, Bld 114 (H) 70 - 99 mg/dL   BUN 14 6 - 20 mg/dL   Creatinine, Ser 4.43 0.61 - 1.24 mg/dL   Calcium 9.4 8.9 - 15.4 mg/dL   Total Protein 8.8 (H) 6.5 - 8.1 g/dL   Albumin 5.2 (H) 3.5 - 5.0 g/dL   AST 31 15 - 41 U/L   ALT 67 (H) 0 - 44 U/L   Alkaline Phosphatase 64 38 - 126 U/L   Total Bilirubin 0.9 0.3 - 1.2 mg/dL   GFR calc non Af Amer >60 >60 mL/min   GFR calc Af Amer >60 >60 mL/min   Anion gap 10 5 - 15    EKG None  Radiology No results found.  Procedures Procedures (including critical care time)  Medications Ordered in ED Medications - No data to display   Initial Impression / Assessment and Plan / ED Course  I have reviewed the triage vital signs and the nursing notes.  Pertinent labs & imaging results that were available during my care of the  patient were reviewed by me and considered in my medical decision making (see chart for details).  Labs sent.  BH team consulted.  Patient with acute psychosis - anticipate will need inpatient psych treatment.   Reviewed nursing notes and prior charts for additional history.   Labs reviewed - chem normal.  BH evaluation pending.   [facility]     Final Clinical Impressions(s) / ED Diagnoses   Final diagnoses:  None    ED Discharge Orders    None       Cathren LaineSteinl, Altonio Schwertner, MD 07/16/18 1544

## 2018-07-16 NOTE — ED Notes (Signed)
Pt belonings reviewed. Pt belongings placed in locker 42.

## 2018-07-16 NOTE — ED Notes (Signed)
Bed: VQ00 Expected date:  Expected time:  Means of arrival:  Comments: Held for Psych

## 2018-07-16 NOTE — ED Notes (Signed)
Patient belongings have been bagged and will be given to Psych RN.

## 2018-07-16 NOTE — ED Notes (Signed)
Pt alert and cooperative. Pt denies any pain or discomfort at this time. Pt denies any si, hi or avh at this time. Pt resting calmly in bed. Pt does not appear to be in any distress. Pt safe, will continue to monitor.

## 2018-07-16 NOTE — ED Notes (Signed)
Bed: WBH42 Expected date:  Expected time:  Means of arrival:  Comments: 

## 2018-07-16 NOTE — ED Notes (Signed)
Pt noted with eye glasses at the bedside.

## 2018-07-16 NOTE — BH Assessment (Signed)
Assessment Note  [name] [name] is an 38 y.o. male that presents this date with IVC. Per IVC: "[name] has been diagnosed with Schizophrenia and feels that people are out to harm him. [name] is running outside and threatens to stab people. He believes that there are ghosts in his home and his medications are going to kill him. [name] has threatened to kill his father with a hammer. He has also made threats of self harm although has not identified a plan. [name] has a history of using Methamphetamines." Patient is observed to be drowsy and difficult to rouse. Patient renders limited history and speaks in a slow soft voice that is inaudible at times. When informed that he was at [facility] he stated "that is probably because I am gone." Patient is unable to elaborate on content of statement and speaks incoherently using word salad. Information to complete assessment was obtained from admission notes and history. Per notes, patient does not know why he is here this date. Patient has a history of schizoaffective disorder, presenting with GPD. Per report, patient has been using methamphetamines and not compliant with his current medication regimen. Per history patient has been receiving outpatient services from [facility]. Patient per family has been paranoid, delusional, hallucinating, and having suicidal thoughts. Patient indicates ghosts and devils are trying to kill him. Patient is very poor historian. UDS is pending this date.  Per chart patient was last hospitalized in February 2019 for psychosis and multiple times in 2018 due to psychosis. Case was staffed with Shaune Pollack DNP who recommended a inpatient admission to assist with stabilization.     Diagnosis: F20.9 Schizophrenia, Methamphetamine abuse   Past Medical History:  Past Medical History:  Diagnosis Date  . Anxiety   . Depression   . Mental disorder   . Schizophrenia (HCC)     History reviewed. No pertinent surgical history.  Family History:   Family History  Problem Relation Age of Onset  . Mental illness Neg Hx     Social History:  reports that he has been smoking cigarettes. He has been smoking about 1.00 pack per day. He has quit using smokeless tobacco. He reports previous alcohol use. He reports current drug use. Drugs: Marijuana and Methamphetamines.  Additional Social History:  Alcohol / Drug Use Pain Medications: See MAR Prescriptions: See MAR Over the Counter: See MAR History of alcohol / drug use?: Yes Longest period of sobriety (when/how long): UTA Negative Consequences of Use: (UTA) Withdrawal Symptoms: (UTA) Substance #1 Name of Substance 1: Methamphetamine per hx 1 - Age of First Use: UTA 1 - Amount (size/oz): UTA 1 - Frequency: UTA 1 - Duration: UTA 1 - Last Use / Amount: UTA UDS pending this date  CIWA: CIWA-Ar BP: (S) (!) 153/108 Pulse Rate: (!) 127 COWS:    Allergies: No Known Allergies  Home Medications: (Not in a hospital admission)   OB/GYN Status:  No LMP for male patient.  General Assessment Data Location of Assessment: [facility] ED TTS Assessment: In system Is this a Tele or Face-to-Face Assessment?: Face-to-Face Is this an Initial Assessment or a Re-assessment for this encounter?: Initial Assessment Patient Accompanied by:: N/A Language Other than English: No Living Arrangements: Other (Comment)(Partner) What gender do you identify as?: Male Marital status: Married Living Arrangements: Spouse/significant other Can pt return to current living arrangement?: Yes Admission Status: Involuntary Petitioner: Family member Is patient capable of signing voluntary admission?: Yes Referral Source: Self/Family/Friend Insurance type: Medicare  Medical Screening Exam [facility] Walk-in ONLY) Medical Exam  completed: Yes  Crisis Care Plan Living Arrangements: Spouse/significant other Legal Guardian: (NA) Name of Psychiatrist: [facility] Name of Therapist: [facility]  Education Status Is patient  currently in school?: No Is the patient employed, unemployed or receiving disability?: Unemployed  Risk to self with the past 6 months Suicidal Ideation: Yes-Currently Present(Per IVC) Has patient been a risk to self within the past 6 months prior to admission? : No Suicidal Intent: Yes-Currently Present(Per IVC) Has patient had any suicidal intent within the past 6 months prior to admission? : No Is patient at risk for suicide?: Yes Suicidal Plan?: No Has patient had any suicidal plan within the past 6 months prior to admission? : No Access to Means: No What has been your use of drugs/alcohol within the last 12 months?: Current use Previous Attempts/Gestures: No How many times?: 0(Per chart) Other Self Harm Risks: (Excessive SA use) Triggers for Past Attempts: Unknown Intentional Self Injurious Behavior: None Family Suicide History: No Recent stressful life event(s): Other (Comment)(Unknown) Persecutory voices/beliefs?: No Depression: (UTA) Depression Symptoms: (UTA) Substance abuse history and/or treatment for substance abuse?: No Suicide prevention information given to non-admitted patients: Not applicable  Risk to Others within the past 6 months Homicidal Ideation: Yes-Currently Present(Per IVC) Does patient have any lifetime risk of violence toward others beyond the six months prior to admission? : Yes (comment)(Threats to family) Thoughts of Harm to Others: Yes-Currently Present(Per IVC) Comment - Thoughts of Harm to Others: Per IVC threats to family Current Homicidal Intent: Yes-Currently Present Current Homicidal Plan: Yes-Currently Present(Threats to family) Describe Current Homicidal Plan: Threats to family Access to Homicidal Means: No Identified Victim: Family members History of harm to others?: Yes Assessment of Violence: On admission Violent Behavior Description: Threats to family Does patient have access to weapons?: No Criminal Charges Pending?: No Does  patient have a court date: No Is patient on probation?: No  Psychosis Hallucinations: Auditory, Visual Delusions: None noted  Mental Status Report Appearance/Hygiene: Unremarkable Eye Contact: Poor Motor Activity: Freedom of movement Speech: Slow, Slurred Level of Consciousness: Drowsy Mood: Suspicious Affect: Flat Anxiety Level: Minimal Thought Processes: Unable to Assess Judgement: Unable to Assess Orientation: Unable to assess Obsessive Compulsive Thoughts/Behaviors: Unable to Assess  Cognitive Functioning Concentration: Unable to Assess Memory: Unable to Assess Is patient IDD: No Insight: Unable to Assess Impulse Control: Unable to Assess Appetite: (UTA) Have you had any weight changes? : (UTA) Sleep: (UTA) Total Hours of Sleep: (UTA) Vegetative Symptoms: (UTA)  ADLScreening [facility]) Patient's cognitive ability adequate to safely complete daily activities?: Yes Patient able to express need for assistance with ADLs?: Yes Independently performs ADLs?: Yes (appropriate for developmental age)  Prior Inpatient Therapy Prior Inpatient Therapy: Yes Prior Therapy Dates: 2019 [facility] Reason for Treatment: MH issues  Prior Outpatient Therapy Prior Outpatient Therapy: Yes Prior Therapy Dates: Ongoing Prior Therapy Facilty/Provider(s): [facility] Reason for Treatment: Med mang Does patient have an ACCT team?: No Does patient have Intensive In-House Services?  : No Does patient have [facility] services? : Yes Does patient have P4CC services?: No  ADL Screening (condition at time of admission) Patient's cognitive ability adequate to safely complete daily activities?: Yes Is the patient deaf or have difficulty hearing?: No Does the patient have difficulty seeing, even when wearing glasses/contacts?: No Does the patient have difficulty concentrating, remembering, or making decisions?: No Patient able to express need for  assistance with ADLs?: Yes Does the patient have difficulty dressing or bathing?: No Independently performs ADLs?: Yes (appropriate  for developmental age) Does the patient have difficulty walking or climbing stairs?: No Weakness of Legs: None Weakness of Arms/Hands: None  Home Assistive Devices/Equipment Home Assistive Devices/Equipment: None  Therapy Consults (therapy consults require a physician order) PT Evaluation Needed: No OT Evalulation Needed: No SLP Evaluation Needed: No Abuse/Neglect Assessment (Assessment to be complete while patient is alone) Physical Abuse: Denies Verbal Abuse: Denies Sexual Abuse: Denies Exploitation of patient/patient's resources: Denies Self-Neglect: Denies Values / Beliefs Cultural Requests During Hospitalization: None Spiritual Requests During Hospitalization: None Consults Spiritual Care Consult Needed: No Social Work Consult Needed: No Merchant navy officer (For Healthcare) Does Patient Have a Medical Advance Directive?: No Would patient like information on creating a medical advance directive?: No - Patient declined          Disposition: Case was staffed with Shaune Pollack DNP who recommended a inpatient admission to assist with stabilization.    Disposition Initial Assessment Completed for this Encounter: Yes Disposition of Patient: Admit Type of inpatient treatment program: Adult Patient refused recommended treatment: No Mode of transportation if patient is discharged/movement?: (Unk)  On Site Evaluation by:   Reviewed with Physician:    Alfredia Ferguson 07/16/2018 4:28 PM

## 2018-07-17 DIAGNOSIS — F250 Schizoaffective disorder, bipolar type: Secondary | ICD-10-CM | POA: Diagnosis not present

## 2018-07-17 DIAGNOSIS — F200 Paranoid schizophrenia: Secondary | ICD-10-CM | POA: Diagnosis not present

## 2018-07-17 LAB — RAPID URINE DRUG SCREEN, HOSP PERFORMED
AMPHETAMINES: POSITIVE — AB
Barbiturates: NOT DETECTED
Benzodiazepines: NOT DETECTED
Cocaine: NOT DETECTED
OPIATES: NOT DETECTED
TETRAHYDROCANNABINOL: NOT DETECTED

## 2018-07-17 MED ORDER — HALOPERIDOL 5 MG PO TABS: 5.0000 mg | ORAL_TABLET | Freq: Two times a day (BID) | ORAL | Status: DC

## 2018-07-17 MED ORDER — HYDROXYZINE HCL 25 MG PO TABS
25.0000 mg | ORAL_TABLET | Freq: Four times a day (QID) | ORAL | Status: DC | PRN
  Administered 2018-07-17 – 2018-07-18 (×2): 25 mg via ORAL
  Filled 2018-07-17 (×2): qty 1

## 2018-07-17 MED ORDER — GABAPENTIN 300 MG PO CAPS
300.0000 mg | ORAL_CAPSULE | Freq: Three times a day (TID) | ORAL | Status: DC
  Administered 2018-07-17 – 2018-07-18 (×4): 300 mg via ORAL
  Filled 2018-07-17 (×4): qty 1

## 2018-07-17 MED ORDER — AMANTADINE HCL 100 MG PO CAPS
100.0000 mg | ORAL_CAPSULE | Freq: Two times a day (BID) | ORAL | Status: DC
  Administered 2018-07-17 – 2018-07-18 (×3): 100 mg via ORAL
  Filled 2018-07-17 (×3): qty 1

## 2018-07-17 NOTE — Consult Note (Signed)
[facility] Face-to-Face Psychiatry Consult   Reason for Consult:  Psychosis, delusions, non-compliant with medication Referring Physician:  EDP Patient Identification: [name] [name] MRN:  [id number] Principal Diagnosis: Schizophrenia, paranoid type (HCC) Diagnosis:  Principal Problem:   Schizophrenia, paranoid type (HCC)   Total Time spent with patient: 45 minutes  Subjective:   [name] [name] is a 38 y.o. male patient admitted with paranoia, psychosis and making threat.  HPI:  Patient with hx Paranoid Schizophrenia who was IVC'd by his sister due to increased agitation, psychosis, delusions, suicidal/homicidal thoughts after he stopped his medications and self medicating with Methamphetamine. Patient reports that he has been seeing ghosts in his room and he believes the ''devils'' are trying to kill him. Today, he is calm, cooperative and admits to smoking cigarette and Methamphetamine.  Past Psychiatric History: as above  Risk to Self: Suicidal Ideation: Yes-Currently Present(Per IVC) Suicidal Intent: Yes-Currently Present(Per IVC) Is patient at risk for suicide?: Yes Suicidal Plan?: No Access to Means: No What has been your use of drugs/alcohol within the last 12 months?: Current use How many times?: 0(Per chart) Other Self Harm Risks: (Excessive SA use) Triggers for Past Attempts: Unknown Intentional Self Injurious Behavior: None Risk to Others: Homicidal Ideation: Yes-Currently Present(Per IVC) Thoughts of Harm to Others: Yes-Currently Present(Per IVC) Comment - Thoughts of Harm to Others: Per IVC threats to family Current Homicidal Intent: Yes-Currently Present Current Homicidal Plan: Yes-Currently Present(Threats to family) Describe Current Homicidal Plan: Threats to family Access to Homicidal Means: No Identified Victim: Family members History of harm to others?: Yes Assessment of Violence: On admission Violent Behavior Description: Threats to family Does patient have access to  weapons?: No Criminal Charges Pending?: No Does patient have a court date: No Prior Inpatient Therapy: Prior Inpatient Therapy: Yes Prior Therapy Dates: 2019 Prior Therapy Facilty/Provider(s): [facility], [facility] Reason for Treatment: MH issues Prior Outpatient Therapy: Prior Outpatient Therapy: Yes Prior Therapy Dates: Ongoing Prior Therapy Facilty/Provider(s): [facility] Reason for Treatment: Med mang Does patient have an ACCT team?: No Does patient have Intensive In-House Services?  : No Does patient have [facility] services? : Yes Does patient have P4CC services?: No  Past Medical History:  Past Medical History:  Diagnosis Date  . Anxiety   . Depression   . Mental disorder   . Schizophrenia (HCC)    History reviewed. No pertinent surgical history. Family History:  Family History  Problem Relation Age of Onset  . Mental illness Neg Hx    Family Psychiatric  History: Social History:  Social History   Substance and Sexual Activity  Alcohol Use Not Currently   Comment: haven't drank in months"     Social History   Substance and Sexual Activity  Drug Use Yes  . Types: Marijuana, Methamphetamines   Comment: Last used: 2 weeks ago    Social History   Socioeconomic History  . Marital status: Single    Spouse name: Not on file  . Number of children: Not on file  . Years of education: Not on file  . Highest education level: Not on file  Occupational History  . Not on file  Social Needs  . Financial resource strain: Not on file  . Food insecurity:    Worry: Not on file    Inability: Not on file  . Transportation needs:    Medical: Not on file    Non-medical: Not on file  Tobacco Use  . Smoking status: Current Every Day Smoker    Packs/day: 1.00  Types: Cigarettes  . Smokeless tobacco: Former Engineer, water and Sexual Activity  . Alcohol use: Not Currently    Comment: haven't drank in months"  . Drug use: Yes    Types: Marijuana, Methamphetamines    Comment: Last  used: 2 weeks ago  . Sexual activity: Yes    Birth control/protection: None  Lifestyle  . Physical activity:    Days per week: Not on file    Minutes per session: Not on file  . Stress: Not on file  Relationships  . Social connections:    Talks on phone: Not on file    Gets together: Not on file    Attends religious service: Not on file    Active member of club or organization: Not on file    Attends meetings of clubs or organizations: Not on file    Relationship status: Not on file  Other Topics Concern  . Not on file  Social History Narrative  . Not on file   Additional Social History:    Allergies:  No Known Allergies  Labs:  Results for orders placed or performed during the hospital encounter of 07/16/18 (from the past 48 hour(s))  CBC     Status: Abnormal   Collection Time: 07/16/18  2:29 PM  Result Value Ref Range   WBC 8.3 4.0 - 10.5 K/uL   RBC 5.94 (H) 4.22 - 5.81 MIL/uL   Hemoglobin 15.2 13.0 - 17.0 g/dL   HCT 16.1 09.6 - 04.5 %   MCV 80.8 80.0 - 100.0 fL   MCH 25.6 (L) 26.0 - 34.0 pg   MCHC 31.7 30.0 - 36.0 g/dL   RDW 40.9 81.1 - 91.4 %   Platelets 290 150 - 400 K/uL   nRBC 0.0 0.0 - 0.2 %    Comment: Performed at [facility], [address] [place], [place] [address]  Comprehensive metabolic panel     Status: Abnormal   Collection Time: 07/16/18  2:29 PM  Result Value Ref Range   Sodium 137 135 - 145 mmol/L   Potassium 3.8 3.5 - 5.1 mmol/L   Chloride 104 98 - 111 mmol/L   CO2 23 22 - 32 mmol/L   Glucose, Bld 114 (H) 70 - 99 mg/dL   BUN 14 6 - 20 mg/dL   Creatinine, Ser 6.21 0.61 - 1.24 mg/dL   Calcium 9.4 8.9 - 30.8 mg/dL   Total Protein 8.8 (H) 6.5 - 8.1 g/dL   Albumin 5.2 (H) 3.5 - 5.0 g/dL   AST 31 15 - 41 U/L   ALT 67 (H) 0 - 44 U/L   Alkaline Phosphatase 64 38 - 126 U/L   Total Bilirubin 0.9 0.3 - 1.2 mg/dL   GFR calc non Af Amer >60 >60 mL/min   GFR calc Af Amer >60 >60 mL/min   Anion gap 10 5 - 15    Comment:  Performed at [facility], [address], [place], [place] [address]  Ethanol     Status: None   Collection Time: 07/16/18  2:29 PM  Result Value Ref Range   Alcohol, Ethyl (B) <10 <10 mg/dL    Comment: (NOTE) Lowest detectable limit for serum alcohol is 10 mg/dL. For medical purposes only. Performed at [facility], [address], [place], [place] [address]   Acetaminophen level     Status: Abnormal   Collection Time: 07/16/18  2:29 PM  Result Value Ref Range   Acetaminophen (Tylenol),  Serum <10 (L) 10 - 30 ug/mL    Comment: (NOTE) Therapeutic concentrations vary significantly. A range of 10-30 ug/mL  may be an effective concentration for many patients. However, some  are best treated at concentrations outside of this range. Acetaminophen concentrations >150 ug/mL at 4 hours after ingestion  and >50 ug/mL at 12 hours after ingestion are often associated with  toxic reactions. Performed at [facility], [address] [place], [place] [address]   Salicylate level     Status: None   Collection Time: 07/16/18  2:29 PM  Result Value Ref Range   Salicylate Lvl <7.0 2.8 - 30.0 mg/dL    Comment: Performed at [facility], [address] [place], [place] [address]  Rapid urine drug screen (hospital performed)     Status: Abnormal   Collection Time: 07/17/18  5:21 AM  Result Value Ref Range   Opiates NONE DETECTED NONE DETECTED   Cocaine NONE DETECTED NONE DETECTED   Benzodiazepines NONE DETECTED NONE DETECTED   Amphetamines POSITIVE (A) NONE DETECTED   Tetrahydrocannabinol NONE DETECTED NONE DETECTED   Barbiturates NONE DETECTED NONE DETECTED    Comment: (NOTE) DRUG SCREEN FOR MEDICAL PURPOSES ONLY.  IF CONFIRMATION IS NEEDED FOR ANY PURPOSE, NOTIFY LAB WITHIN 5 DAYS. LOWEST DETECTABLE LIMITS FOR URINE DRUG SCREEN Drug Class                     Cutoff (ng/mL) Amphetamine and metabolites     1000 Barbiturate and metabolites    200 Benzodiazepine                 200 Tricyclics and metabolites     300 Opiates and metabolites        300 Cocaine and metabolites        300 THC                            50 Performed at [facility], [address] [place], [place] [address]     Current Facility-Administered Medications  Medication Dose Route Frequency Provider Last Rate Last Dose  . amantadine (SYMMETREL) capsule 100 mg  100 mg Oral BID Raife Lizer, MD      . gabapentin (NEURONTIN) capsule 300 mg  300 mg Oral TID Charm RingsLord, Jamison Y, NP   300 mg at 07/17/18 0959  . haloperidol (HALDOL) tablet 5 mg  5 mg Oral BID Charm RingsLord, Jamison Y, NP   5 mg at 07/17/18 [id number]  . hydrOXYzine (ATARAX/VISTARIL) tablet 25 mg  25 mg Oral Q6H PRN Charm RingsLord, Jamison Y, NP   25 mg at 07/17/18 [id number]   Current Outpatient Medications  Medication Sig Dispense Refill  . hydrOXYzine (ATARAX/VISTARIL) 50 MG tablet Take 1 tablet (50 mg total) by mouth every 6 (six) hours as needed for anxiety. (Patient not taking: Reported on 07/16/2018) 60 tablet 0  . nicotine (NICODERM CQ - DOSED IN MG/24 HOURS) 21 mg/24hr patch Place 1 patch (21 mg total) onto the skin daily. (May buy from over the counter): For smoking cessation (Patient not taking: Reported on 07/16/2018) 28 patch 0  . OLANZapine (ZYPREXA) 15 MG tablet Take 1 tablet (15 mg total) by mouth at bedtime. For mood control (Patient not taking: Reported on 07/16/2018) 30 tablet 0  . OLANZapine (ZYPREXA) 5 MG tablet Take 1 tablet (5 mg total) by mouth daily. For mood control (Patient not taking: Reported on 07/16/2018)  30 tablet 0  . traZODone (DESYREL) 50 MG tablet Take 1 tablet (50 mg total) by mouth at bedtime as needed for sleep. (Patient not taking: Reported on 07/16/2018) 30 tablet 0    Musculoskeletal: Strength & Muscle Tone: within normal limits Gait & Station: normal Patient leans: N/A  Psychiatric Specialty Exam: Physical Exam  Psychiatric:  His affect is blunt. His speech is delayed. He is actively hallucinating. Thought content is paranoid. Cognition and memory are normal. He expresses impulsivity.    Review of Systems  Constitutional: Negative.   HENT: Negative.   Eyes: Negative.   Respiratory: Negative.   Cardiovascular: Negative.   Gastrointestinal: Negative.   Genitourinary: Negative.   Musculoskeletal: Negative.   Skin: Negative.   Neurological: Negative.   Endo/Heme/Allergies: Negative.   Psychiatric/Behavioral: Positive for hallucinations.    Blood pressure 128/82, pulse 88, temperature 97.7 F (36.5 C), temperature source Oral, resp. rate 16, SpO2 98 %.There is no height or weight on file to calculate BMI.  General Appearance: Casual  Eye Contact:  Fair  Speech:  Clear and Coherent  Volume:  Normal  Mood:  Dysphoric  Affect:  Constricted  Thought Process:  Coherent  Orientation:  Full (Time, Place, and Person)  Thought Content:  Hallucinations: Auditory and Paranoid Ideation  Suicidal Thoughts:  Yes.  with intent/plan  Homicidal Thoughts:  Yes.  with intent/plan  Memory:  Immediate;   Fair Recent;   Fair Remote;   Fair  Judgement:  Poor  Insight:  Shallow  Psychomotor Activity:  Psychomotor Retardation  Concentration:  Concentration: Fair and Attention Span: Fair  Recall:  [employer]  Fund of Knowledge:  Fair  Language:  Good  Akathisia:  No  Handed:  Right  AIMS (if indicated):     Assets:  Communication Skills Desire for Improvement  ADL's:  Intact  Cognition:  WNL  Sleep:   poor     Treatment Plan Summary: Daily contact with patient to assess and evaluate symptoms and progress in treatment and Medication management  -Haldol 5 mg bid for psychosis/delusions -Gabapentin 300 mg bid for mood -Amantadine 100 mg bid for EPS Prevention.  Disposition: Recommend psychiatric Inpatient admission when medically cleared.  Thedore MinsMojeed Najib Colmenares, MD 07/17/2018 10:45 AM

## 2018-07-17 NOTE — ED Notes (Signed)
Bed: WA27 Expected date:  Expected time:  Means of arrival:  Comments: Hold room 42

## 2018-07-17 NOTE — ED Notes (Signed)
Bed: WBH34 Expected date:  Expected time:  Means of arrival:  Comments: 

## 2018-07-17 NOTE — ED Notes (Signed)
Up to the bathroom to shower, smiling, pleasent

## 2018-07-17 NOTE — ED Notes (Signed)
Pt sedated at present, awake, alert & responsive, no distress noted, calm at present.  Monitoring for safety, Sitter at bedside.

## 2018-07-17 NOTE — ED Notes (Signed)
Resting quietly nad.

## 2018-07-17 NOTE — ED Notes (Signed)
Up to the desk, concerned about taking care of his family, wanting to go home, is aware that he will talk to the dr this morning. Redirectable, support given.

## 2018-07-17 NOTE — ED Notes (Signed)
On the phone 

## 2018-07-17 NOTE — ED Notes (Signed)
Dr Akintyo and Jamison DNP into see 

## 2018-07-17 NOTE — ED Notes (Signed)
Up walking in the hall 

## 2018-07-17 NOTE — ED Notes (Signed)
Pt sitting quielty, nad.  PT denies si/hi at this time, but reports that he does see shadows.  When asked about AH, pt reports that when he is alone he "thinks a lot..."  Pt anxious, concerned about his family.  Pt does acknowlede that he had a knife in his pocket, but reports that he was no threatening anybody.

## 2018-07-17 NOTE — ED Notes (Signed)
Resting quielty, feeling better.  Pt reports that he is still hearing voices, but he is not "as fearful."

## 2018-07-17 NOTE — ED Notes (Signed)
PT ambulatory w/o difficulty to room 42, oriented to room, belongings placed in locker 27

## 2018-07-18 ENCOUNTER — Inpatient Hospital Stay (HOSPITAL_COMMUNITY)
Admission: AD | Admit: 2018-07-18 | Discharge: 2018-07-22 | DRG: 885 | Disposition: A | Discharge status: Home / Self Care | Payer: Medicare Other | Attending: Psychiatry | Admitting: Psychiatry

## 2018-07-18 ENCOUNTER — Encounter (HOSPITAL_COMMUNITY): Payer: Self-pay | Admitting: *Deleted

## 2018-07-18 ENCOUNTER — Other Ambulatory Visit: Payer: Self-pay

## 2018-07-18 DIAGNOSIS — F17210 Nicotine dependence, cigarettes, uncomplicated: Secondary | ICD-10-CM | POA: Diagnosis present

## 2018-07-18 DIAGNOSIS — Z9114 Patient's other noncompliance with medication regimen: Secondary | ICD-10-CM | POA: Diagnosis not present

## 2018-07-18 DIAGNOSIS — F250 Schizoaffective disorder, bipolar type: Secondary | ICD-10-CM | POA: Diagnosis not present

## 2018-07-18 DIAGNOSIS — F15159 Other stimulant abuse with stimulant-induced psychotic disorder, unspecified: Secondary | ICD-10-CM | POA: Diagnosis present

## 2018-07-18 DIAGNOSIS — F419 Anxiety disorder, unspecified: Secondary | ICD-10-CM | POA: Diagnosis present

## 2018-07-18 DIAGNOSIS — Z79899 Other long term (current) drug therapy: Secondary | ICD-10-CM

## 2018-07-18 DIAGNOSIS — R45851 Suicidal ideations: Secondary | ICD-10-CM | POA: Diagnosis present

## 2018-07-18 DIAGNOSIS — F15959 Other stimulant use, unspecified with stimulant-induced psychotic disorder, unspecified: Secondary | ICD-10-CM | POA: Diagnosis not present

## 2018-07-18 DIAGNOSIS — F1410 Cocaine abuse, uncomplicated: Secondary | ICD-10-CM | POA: Diagnosis present

## 2018-07-18 DIAGNOSIS — F1010 Alcohol abuse, uncomplicated: Secondary | ICD-10-CM | POA: Diagnosis present

## 2018-07-18 DIAGNOSIS — F1210 Cannabis abuse, uncomplicated: Secondary | ICD-10-CM | POA: Diagnosis present

## 2018-07-18 MED ORDER — HYDROXYZINE HCL 25 MG PO TABS
25.0000 mg | ORAL_TABLET | Freq: Four times a day (QID) | ORAL | Status: DC | PRN
  Administered 2018-07-19 – 2018-07-21 (×2): 25 mg via ORAL
  Filled 2018-07-18 (×3): qty 1

## 2018-07-18 MED ORDER — AMANTADINE HCL 100 MG PO CAPS
100.0000 mg | ORAL_CAPSULE | Freq: Two times a day (BID) | ORAL | Status: DC
  Administered 2018-07-18 – 2018-07-22 (×8): 100 mg via ORAL
  Filled 2018-07-18 (×11): qty 1

## 2018-07-18 MED ORDER — MAGNESIUM HYDROXIDE 400 MG/5ML PO SUSP: 30.0000 mL | Freq: Every day | ORAL | Status: DC | PRN

## 2018-07-18 MED ORDER — OLANZAPINE 10 MG PO TABS
10.0000 mg | ORAL_TABLET | Freq: Two times a day (BID) | ORAL | Status: DC
  Administered 2018-07-18 – 2018-07-19 (×2): 10 mg via ORAL
  Filled 2018-07-18 (×5): qty 1

## 2018-07-18 MED ORDER — GABAPENTIN 300 MG PO CAPS
300.0000 mg | ORAL_CAPSULE | Freq: Three times a day (TID) | ORAL | Status: DC
  Administered 2018-07-19 – 2018-07-22 (×10): 300 mg via ORAL
  Filled 2018-07-18 (×13): qty 1

## 2018-07-18 MED ORDER — OLANZAPINE 10 MG PO TABS
10.0000 mg | ORAL_TABLET | Freq: Two times a day (BID) | ORAL | Status: DC
  Administered 2018-07-18: 10 mg via ORAL
  Filled 2018-07-18: qty 1

## 2018-07-18 MED ORDER — ALUM & MAG HYDROXIDE-SIMETH 200-200-20 MG/5ML PO SUSP: 30.0000 mL | ORAL | Status: DC | PRN

## 2018-07-18 MED ORDER — ACETAMINOPHEN 325 MG PO TABS: 650.0000 mg | ORAL_TABLET | Freq: Four times a day (QID) | ORAL | Status: DC | PRN

## 2018-07-18 MED ORDER — TRAZODONE HCL 50 MG PO TABS
50.0000 mg | ORAL_TABLET | Freq: Every evening | ORAL | Status: DC | PRN
  Administered 2018-07-18 – 2018-07-21 (×3): 50 mg via ORAL
  Filled 2018-07-18 (×4): qty 1

## 2018-07-18 NOTE — Progress Notes (Signed)
[name] [name] is a 38 year old male pt admitted on involuntary basis. On admission, [name] [name] appears fidgety but cooperative. He does acknowledge that he has not been taking his medications and does endorse drug use. He also endorses depression, suicidal thoughts and auditory hallucinations recently but is able to contract for safety while in the hospital. He reports that he would like to get back on his medications while he is here. He reports that he lives with his wife and parents and reports that he will return there once he is discharged. [name] [name] was escorted to the unit, oriented to the milieu and safety maintained.

## 2018-07-18 NOTE — Progress Notes (Signed)
Patient ID: [name] [name], male   DOB: [birth year], 38 y.o.   MRN: [id number] Per State regulations 482.30 this chart was reviewed for medical necessity with respect to the patient's admission/duration of stay.    Next review date: 07/22/2018  Thurman Coyer, BSN, RN-BC  Case Manager

## 2018-07-18 NOTE — ED Notes (Signed)
Pt discharged safely with [employer].  Pt was in no distress and all belongings were sent with pt.

## 2018-07-18 NOTE — Consult Note (Addendum)
[facility] Face-to-Face Psychiatry Consult   Reason for Consult:  Psychosis, delusions, non-compliant with medication Referring Physician:  EDP Patient Identification: [name] [name] MRN:  [id number] Principal Diagnosis: Schizoaffective disorder, bipolar type (HCC) Diagnosis:  Principal Problem:   Schizoaffective disorder, bipolar type (HCC)   Total Time spent with patient: 45 minutes  Subjective:   [name] [name] is a 38 y.o. male patient admitted with paranoia, psychosis and making threat.  HPI:  Patient with hx Paranoid Schizophrenia who was IVC'd by his sister due to increased agitation, psychosis, delusions, suicidal/homicidal thoughts after he stopped his medications and self medicating with methamphetamine. Patient continues to yell at things in his room and paranoid someone is "messing me up."  Disorganized, cooperative, calm.  Past Psychiatric History: as above  Risk to Self: Suicidal Ideation: Yes-Currently Present(Per IVC) Suicidal Intent: Yes-Currently Present(Per IVC) Is patient at risk for suicide?: Yes Suicidal Plan?: No Access to Means: No What has been your use of drugs/alcohol within the last 12 months?: Current use How many times?: 0(Per chart) Other Self Harm Risks: (Excessive SA use) Triggers for Past Attempts: Unknown Intentional Self Injurious Behavior: None Risk to Others: Homicidal Ideation: Yes-Currently Present(Per IVC) Thoughts of Harm to Others: Yes-Currently Present(Per IVC) Comment - Thoughts of Harm to Others: Per IVC threats to family Current Homicidal Intent: Yes-Currently Present Current Homicidal Plan: Yes-Currently Present(Threats to family) Describe Current Homicidal Plan: Threats to family Access to Homicidal Means: No Identified Victim: Family members History of harm to others?: Yes Assessment of Violence: On admission Violent Behavior Description: Threats to family Does patient have access to weapons?: No Criminal Charges Pending?: No Does patient  have a court date: No Prior Inpatient Therapy: Prior Inpatient Therapy: Yes Prior Therapy Dates: 2019 Prior [facility]ilty[facility]Provider[facility], [facility] Reason for Treatment: MH issues Prior Outpatient Therapy: Prior Outpatient Therapy: Yes Prior Therapy Dates: Ongoing Prior Therapy Facilty/Provider(s): [facility] Reason for Treatment: Med mang Does patient have an ACCT team?: No Does patient have Intensive In-House Services?  : No Does patient have [facility] services? : Yes Does patient have P4CC services?: No  Past Medical History:  Past Medical History:  Diagnosis Date  . Anxiety   . Depression   . Mental disorder   . Schizophrenia (HCC)    History reviewed. No pertinent surgical history. Family History:  Family History  Problem Relation Age of Onset  . Mental illness Neg Hx    Family Psychiatric  History: None  Social History:  Social History   Substance and Sexual Activity  Alcohol Use Not Currently   Comment: haven't drank in months"     Social History   Substance and Sexual Activity  Drug Use Yes  . Types: Marijuana, Methamphetamines   Comment: Last used: 2 weeks ago    Social History   Socioeconomic History  . Marital status: Single    Spouse name: Not on file  . Number of children: Not on file  . Years of education: Not on file  . Highest education level: Not on file  Occupational History  . Not on file  Social Needs  . Financial resource strain: Not on file  . Food insecurity:    Worry: Not on file    Inability: Not on file  . Transportation needs:    Medical: Not on file    Non-medical: Not on file  Tobacco Use  . Smoking status: Current Every Day Smoker    Packs/day: 1.00    Types: Cigarettes  . Smokeless tobacco: Former Neurosurgeon  Substance and Sexual Activity  . Alcohol use: Not Currently    Comment: haven't drank in months"  . Drug use: Yes    Types: Marijuana, Methamphetamines    Comment: Last used: 2 weeks ago  . Sexual activity: Yes    Birth  control/protection: None  Lifestyle  . Physical activity:    Days per week: Not on file    Minutes per session: Not on file  . Stress: Not on file  Relationships  . Social connections:    Talks on phone: Not on file    Gets together: Not on file    Attends religious service: Not on file    Active member of club or organization: Not on file    Attends meetings of clubs or organizations: Not on file    Relationship status: Not on file  Other Topics Concern  . Not on file  Social History Narrative  . Not on file   Additional Social History: N/A    Allergies:  No Known Allergies  Labs:  Results for orders placed or performed during the hospital encounter of 07/16/18 (from the past 48 hour(s))  CBC     Status: Abnormal   Collection Time: 07/16/18  2:29 PM  Result Value Ref Range   WBC 8.3 4.0 - 10.5 K/uL   RBC 5.94 (H) 4.22 - 5.81 MIL/uL   Hemoglobin 15.2 13.0 - 17.0 g/dL   HCT 16.1 09.6 - 04.5 %   MCV 80.8 80.0 - 100.0 fL   MCH 25.6 (L) 26.0 - 34.0 pg   MCHC 31.7 30.0 - 36.0 g/dL   RDW 40.9 81.1 - 91.4 %   Platelets 290 150 - 400 K/uL   nRBC 0.0 0.0 - 0.2 %    Comment: Performed at [facility], [address] [place], [place] [address]  Comprehensive metabolic panel     Status: Abnormal   Collection Time: 07/16/18  2:29 PM  Result Value Ref Range   Sodium 137 135 - 145 mmol/L   Potassium 3.8 3.5 - 5.1 mmol/L   Chloride 104 98 - 111 mmol/L   CO2 23 22 - 32 mmol/L   Glucose, Bld 114 (H) 70 - 99 mg/dL   BUN 14 6 - 20 mg/dL   Creatinine, Ser 6.21 0.61 - 1.24 mg/dL   Calcium 9.4 8.9 - 30.8 mg/dL   Total Protein 8.8 (H) 6.5 - 8.1 g/dL   Albumin 5.2 (H) 3.5 - 5.0 g/dL   AST 31 15 - 41 U/L   ALT 67 (H) 0 - 44 U/L   Alkaline Phosphatase 64 38 - 126 U/L   Total Bilirubin 0.9 0.3 - 1.2 mg/dL   GFR calc non Af Amer >60 >60 mL/min   GFR calc Af Amer >60 >60 mL/min   Anion gap 10 5 - 15    Comment: Performed at [facility], [address] [place], [place] [address]  Ethanol     Status: None   Collection Time: 07/16/18  2:29 PM  Result Value Ref Range   Alcohol, Ethyl (B) <10 <10 mg/dL    Comment: (NOTE) Lowest detectable limit for serum alcohol is 10 mg/dL. For medical purposes only. Performed at [facility], [address], [place], [place] [address]   Acetaminophen level     Status: Abnormal   Collection Time: 07/16/18  2:29 PM  Result Value Ref Range   Acetaminophen (Tylenol), Serum <10 (L) 10 - 30 ug/mL  Comment: (NOTE) Therapeutic concentrations vary significantly. A range of 10-30 ug/mL  may be an effective concentration for many patients. However, some  are best treated at concentrations outside of this range. Acetaminophen concentrations >150 ug/mL at 4 hours after ingestion  and >50 ug/mL at 12 hours after ingestion are often associated with  toxic reactions. Performed at [facility], [address], [place], [place] [address]   Salicylate level     Status: None   Collection Time: 07/16/18  2:29 PM  Result Value Ref Range   Salicylate Lvl <7.0 2.8 - 30.0 mg/dL    Comment: Performed at [facility], [address] [place], [place] [address]  Rapid urine drug screen (hospital performed)     Status: Abnormal   Collection Time: 07/17/18  5:21 AM  Result Value Ref Range   Opiates NONE DETECTED NONE DETECTED   Cocaine NONE DETECTED NONE DETECTED   Benzodiazepines NONE DETECTED NONE DETECTED   Amphetamines POSITIVE (A) NONE DETECTED   Tetrahydrocannabinol NONE DETECTED NONE DETECTED   Barbiturates NONE DETECTED NONE DETECTED    Comment: (NOTE) DRUG SCREEN FOR MEDICAL PURPOSES ONLY.  IF CONFIRMATION IS NEEDED FOR ANY PURPOSE, NOTIFY LAB WITHIN 5 DAYS. LOWEST DETECTABLE LIMITS FOR URINE DRUG SCREEN Drug Class                     Cutoff (ng/mL) Amphetamine and metabolites    1000 Barbiturate and metabolites    200 Benzodiazepine                  200 Tricyclics and metabolites     300 Opiates and metabolites        300 Cocaine and metabolites        300 THC                            50 Performed at [facility], [address], [place], [place] [address]     Current Facility-Administered Medications  Medication Dose Route Frequency Provider Last Rate Last Dose  . amantadine (SYMMETREL) capsule 100 mg  100 mg Oral BID Thedore Mins, MD   100 mg at 07/18/18 0905  . gabapentin (NEURONTIN) capsule 300 mg  300 mg Oral TID Charm Rings, NP   300 mg at 07/18/18 8295  . haloperidol (HALDOL) tablet 5 mg  5 mg Oral BID Charm Rings, NP   5 mg at 07/18/18 0905  . hydrOXYzine (ATARAX/VISTARIL) tablet 25 mg  25 mg Oral Q6H PRN Charm Rings, NP   25 mg at 07/18/18 6213   Current Outpatient Medications  Medication Sig Dispense Refill  . hydrOXYzine (ATARAX/VISTARIL) 50 MG tablet Take 1 tablet (50 mg total) by mouth every 6 (six) hours as needed for anxiety. (Patient not taking: Reported on 07/16/2018) 60 tablet 0  . nicotine (NICODERM CQ - DOSED IN MG/24 HOURS) 21 mg/24hr patch Place 1 patch (21 mg total) onto the skin daily. (May buy from over the counter): For smoking cessation (Patient not taking: Reported on 07/16/2018) 28 patch 0  . OLANZapine (ZYPREXA) 15 MG tablet Take 1 tablet (15 mg total) by mouth at bedtime. For mood control (Patient not taking: Reported on 07/16/2018) 30 tablet 0  . OLANZapine (ZYPREXA) 5 MG tablet Take 1 tablet (5 mg total) by mouth daily. For mood control (Patient not taking: Reported on 07/16/2018) 30 tablet 0  . traZODone (DESYREL)  50 MG tablet Take 1 tablet (50 mg total) by mouth at bedtime as needed for sleep. (Patient not taking: Reported on 07/16/2018) 30 tablet 0    Musculoskeletal: Strength & Muscle Tone: within normal limits Gait & Station: normal Patient leans: N/A  Psychiatric Specialty Exam: Physical Exam  Nursing note and vitals reviewed. Constitutional: He  is oriented to person, place, and time. He appears well-developed and well-nourished.  HENT:  Head: Normocephalic.  Neck: Normal range of motion.  Cardiovascular: Normal rate.  Respiratory: Effort normal.  Musculoskeletal: Normal range of motion.  Neurological: He is alert and oriented to person, place, and time.  Psychiatric: His affect is blunt. His speech is delayed. He is actively hallucinating. Thought content is paranoid. Cognition and memory are normal. He expresses impulsivity.    Review of Systems  Constitutional: Negative.   HENT: Negative.   Eyes: Negative.   Respiratory: Negative.   Cardiovascular: Negative.   Gastrointestinal: Negative.   Genitourinary: Negative.   Musculoskeletal: Negative.   Skin: Negative.   Neurological: Negative.   Endo/Heme/Allergies: Negative.   Psychiatric/Behavioral: Positive for hallucinations and substance abuse. The patient is nervous/anxious.     Blood pressure 95/63, pulse 89, temperature 98.5 F (36.9 C), temperature source Oral, resp. rate 20, SpO2 97 %.There is no height or weight on file to calculate BMI.  General Appearance: Casual  Eye Contact:  Fair  Speech:  Clear and Coherent  Volume:  Normal  Mood:  Dysphoric  Affect:  Constricted  Thought Process:  Coherent  Orientation:  Full (Time, Place, and Person)  Thought Content:  Hallucinations: Auditory and Paranoid Ideation  Suicidal Thoughts:  Yes.  with intent/plan  Homicidal Thoughts:  Yes.  with intent/plan  Memory:  Immediate;   Fair Recent;   Fair Remote;   Fair  Judgement:  Poor  Insight:  Shallow  Psychomotor Activity:  Psychomotor Retardation  Concentration:  Concentration: Fair and Attention Span: Fair  Recall:  [employer]  Fund of Knowledge:  Fair  Language:  Good  Akathisia:  No  Handed:  Right  AIMS (if indicated):   N/A  Assets:  Communication Skills Desire for Improvement Physical Health  ADL's:  Intact  Cognition:  WNL  Sleep:   Poor     Treatment  Plan Summary: Daily contact with patient to assess and evaluate symptoms and progress in treatment and Medication management  -Discontinued Haldol 5 mg BID for psychosis/delusions -Started Zyprexa 10 mg BID for psychosis -Gabapentin 300 mg TID for mood -Amantadine 100 mg bid for EPS prevention. -Continued Hydroxyzine 25 mg every six hours PRN anxiety  Disposition: Recommend psychiatric Inpatient admission when medically cleared.  Nanine MeansLORD, JAMISON, NP 07/18/2018 11:07 AM   Patient seen face-to-face for psychiatric evaluation, chart reviewed and case discussed with the physician extender and developed treatment plan. Reviewed the information documented and agree with the treatment plan.  Juanetta BeetsJacqueline Locke Barrell, DO 07/18/18 12:48 PM

## 2018-07-18 NOTE — Tx Team (Signed)
Initial Treatment Plan 07/18/2018 8:16 PM [name] ZJI:[id number]    PATIENT STRESSORS: Medication change or noncompliance Substance abuse   PATIENT STRENGTHS: Ability for insight Average or above average intelligence Capable of independent living General fund of knowledge Motivation for treatment/growth Supportive family/friends   PATIENT IDENTIFIED PROBLEMS: Depression Suicidal thoughts Substance abuse Hallucinations "Take my medicine and try to cope"                     DISCHARGE CRITERIA:  Ability to meet basic life and health needs Improved stabilization in mood, thinking, and/or behavior Reduction of life-threatening or endangering symptoms to within safe limits Verbal commitment to aftercare and medication compliance  PRELIMINARY DISCHARGE PLAN: Attend aftercare/continuing care group Return to previous living arrangement  PATIENT/FAMILY INVOLVEMENT: This treatment plan has been presented to and reviewed with the patient, [name] [name], and/or family member, .  The patient and family have been given the opportunity to ask questions and make suggestions.  [name], [place], [place] 07/18/2018, 8:16 PM

## 2018-07-18 NOTE — BH Assessment (Signed)
[facility] Assessment Progress Note  Per Juanetta Beets, DO, this pt requires psychiatric hospitalization.  Berneice Heinrich, RN, [facility] has assigned pt to [facility] Rm 507-2.  Pt presents under IVC initiated by pt's sister, and upheld by Thedore Mins, MD, and IVC documents have been faxed to [facility].  Pt's nurse, Kendal Hymen, has been notified, and agrees to call report to [phone].  Pt is to be transported via [employer].   Doylene Canning, [place] Behavioral Health Coordinator [phone]

## 2018-07-18 NOTE — Progress Notes (Signed)
D: Pt denies SI/HI/AVH. Pt is pleasant and cooperative. Pt kept to himself much of this evening, pt appears to be getting used to the unit.  A: Pt was offered support and encouragement.. Pt was encourage to attend groups. Q 15 minute checks were done for safety.  R: safety maintained on unit.  Problem: Education: Goal: Mental status will improve Outcome: Progressing   Problem: Activity: Goal: Sleeping patterns will improve Outcome: Progressing

## 2018-07-19 DIAGNOSIS — F250 Schizoaffective disorder, bipolar type: Principal | ICD-10-CM

## 2018-07-19 MED ORDER — PERPHENAZINE 4 MG PO TABS
4.0000 mg | ORAL_TABLET | Freq: Three times a day (TID) | ORAL | Status: DC
  Administered 2018-07-19 – 2018-07-22 (×8): 4 mg via ORAL
  Filled 2018-07-19 (×12): qty 1
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
## 2018-07-19 NOTE — Progress Notes (Signed)
Recreation Therapy Notes  Date: 1.14.20 Time: 1000 Location: 500 Hall Dayroom  Group Topic: Wellness  Goal Area(s) Addresses:  Patient will define components of whole wellness. Patient will verbalize benefit of whole wellness.  Behavioral Response: Engaged  Intervention:  Music  Activity:  Exercise.  LRT lead the group in series of stretches to get them loosened up to complete exercises.  Each person was then the chance to lead the group in an exercise of their choice.  Patients were allowed to take breaks and get water as they needed.  Patients were to complete at least 30 minutes of exercise.    Education: Wellness, Building control surveyor.   Education Outcome: Acknowledges education/In group clarification offered/Needs additional education.   Clinical Observations/Feedback: Pt was active and engaged during group session.  Pt was appropriate and followed along with the activities.  Pt also lead a few exercises as well.    Caroll Rancher, LRT/CTRS         Caroll Rancher A 07/19/2018 11:44 AM

## 2018-07-19 NOTE — B[facility] Suicide Risk Assessment (Signed)
[facility] Admission Suicide Risk Assessment   Nursing information obtained from:  Patient Demographic factors:  Male Current Mental Status:  NA Loss Factors:  NA Historical Factors:  Family history of mental illness or substance abuse Risk Reduction Factors:  Responsible for children under 37 years of age, Living with another person, especially a relative, Positive coping skills or problem solving skills  Total Time spent with patient: 30 minutes Principal Problem: Psychosis in the context of underlying disorder and substance abuse Diagnosis:  Active Problems:   Schizoaffective disorder, bipolar type (HCC)  Subjective Data: Patient continues to deny minimize symptoms denies current thoughts of harming self or others contracting Continued Clinical Symptoms:  Alcohol Use Disorder Identification Test Final Score (AUDIT): 0 The "Alcohol Use Disorders Identification Test", Guidelines for Use in Primary Care, Second Edition.  World Science writer [facility]). Score between 0-7:  no or low risk or alcohol related problems. Score between 8-15:  moderate risk of alcohol related problems. Score between 16-19:  high risk of alcohol related problems. Score 20 or above:  warrants further diagnostic evaluation for alcohol dependence and treatment.   CLINICAL FACTORS:   Schizophrenia:   Less than 42 years old     COGNITIVE FEATURES THAT CONTRIBUTE TO RISK:  Thought constriction (tunnel vision)    SUICIDE RISK:   Minimal: No identifiable suicidal ideation.  Patients presenting with no risk factors but with morbid ruminations; may be classified as minimal risk based on the severity of the depressive symptoms  PLAN OF CARE: meds- detox  I certify that inpatient services furnished can reasonably be expected to improve the patient's condition.   Malvin Johns, MD 07/19/2018, 10:18 AM

## 2018-07-19 NOTE — BHH Group Notes (Signed)
BHH LCSW Group Therapy Note  Date/Time: 07/19/18, 1100  Type of Therapy/Topic:  Group Therapy:  Feelings about Diagnosis  Participation Level:  None   Mood:asleep   Description of Group:    This group will allow patients to explore their thoughts and feelings about diagnoses they have received. Patients will be guided to explore their level of understanding and acceptance of these diagnoses. Facilitator will encourage patients to process their thoughts and feelings about the reactions of others to their diagnosis, and will guide patients in identifying ways to discuss their diagnosis with significant others in their lives. This group will be process-oriented, with patients participating in exploration of their own experiences as well as giving and receiving support and challenge from other group members.   Therapeutic Goals: 1. Patient will demonstrate understanding of diagnosis as evidence by identifying two or more symptoms of the disorder:  2. Patient will be able to express two feelings regarding the diagnosis 3. Patient will demonstrate ability to communicate their needs through discussion and/or role plays  Summary of Patient Progress:Pt feel asleep early on in group and did not participate.        Therapeutic Modalities:   Cognitive Behavioral Therapy Brief Therapy Feelings Identification   Daleen Squibb, LCSW
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
## 2018-07-20 NOTE — Plan of Care (Signed)
Progress note  D: pt found in the hallway; compliant with medication administration. Pt states he slept well. Pt rates his depression/hopelessness/anxiety a 4/3/1 out of 10 respectively. Pt has complaints of cravings but no physical pain, rating his pain a 0/10. Pt states his goals for today is to focus on visitation, release, and wrap up group plan. Pt will achieve this by going to groups and resting. Pt denies any si/hi/ah/vh and verbally agrees to approach staff if these become apparent or before harming himself or others while at [facility]. Pt has complaints of just feeling sad and not being able to get over it today. A: pt provided support and encouragement. Pt given medication per protocol and standing orders. Q68m safety checks implemented and continued.  R: pt safe on the unit. Will continue to monitor.   Pt progressing in the following metrics  Problem: Education: Goal: Knowledge of [name] General Education information/materials will improve Outcome: Progressing Goal: Verbalization of understanding the information provided will improve Outcome: Progressing   Problem: Coping: Goal: Ability to verbalize frustrations and anger appropriately will improve Outcome: Progressing Goal: Ability to demonstrate self-control will improve Outcome: Progressing

## 2018-07-20 NOTE — Therapy (Signed)
Occupational Therapy Group Note  Date:  07/20/2018 Time:  3:35 PM  Group Topic/Focus:  Stress Management  Participation Level:  Active  Participation Quality:  Appropriate  Affect:  Blunted  Cognitive:  Appropriate  Insight: Limited  Engagement in Group:  Engaged  Modes of Intervention:  Activity, Discussion, Education and Socialization  Additional Comments:    S: "Cooking and cleaning are good stress management activities"  O: Education given on stress management and healthy coping mechanisms. Pt encouraged to brainstorm with other peers and discuss what has worked in the past vs what has not. Pts further encouraged to discuss new coping stress management strategies to implement this date. Art activity made to display preferred coping mechanisms, along with incorporating the stress management outlet of coloring/art.   A: Pt presents to group with blunted affect, engaged and participatory throughout entirety of session. Pt presented with flat affect guarded, improved when engaged in activity. Pt sharing appropriate stress management strategies. Art activity completed with min VC's, pt very engaged and proud of final product. Noted significant affect increase, eager to show to others.  P: Pt provided with education on stress management activities to implement into daily routine. Handouts given to facilitate carryover when reintegrating into community   Dalphine Handing, MSOT, OTR/L [employer] OT/ Acute Relief OT PHP Office: [phone]  Dalphine Handing 07/20/2018, 3:35 PM

## 2018-07-20 NOTE — Progress Notes (Signed)
Recreation Therapy Notes  Date: 1.15.20 Time: 1000 Location: 500 Hall Dayroom  Group Topic: Communication  Goal Area(s) Addresses:  Patient will effectively communicate with peers in group.  Patient will verbalize benefit of healthy communication. Patient will verbalize positive effect of healthy communication on post d/c goals.  Patient will identify communication techniques that made activity effective for group.   Behavioral Response: Minimal  Intervention:  Worksheet, pencils  Activity: Back to Back Drawings.  Patients were placed in groups of 2 and seated back to back.  One person was given a geometrical drawing that they had to describe to their partner.  Their partner was to draw the picture as it was described them however, they could not ask any questions.    Education: Communication, Discharge Planning  Education Outcome: Acknowledges understanding/In group clarification offered/Needs additional education.   Clinical Observations/Feedback: Pt worked well with his peers.  Pt left early and did not return.    Caroll Rancher, LRT/CTRS         Caroll Rancher A 07/20/2018 11:50 AM

## 2018-07-20 NOTE — Progress Notes (Signed)
Transition care team staff met with pt to discuss working with that program at discharge. Rosea Dory, MSW, LCSW Clinical Social Worker 07/20/2018 10:50 AM 

## 2018-07-20 NOTE — [facility] Suicide Risk Assessment (Signed)
[facility] INPATIENT:  Family/Significant Other Suicide Prevention Education  Suicide Prevention Education:  Contact Attempts: Ricard Dillon, father, [phone], has been identified by the patient as the family member/significant other with whom the patient will be residing, and identified as the person(s) who will aid the patient in the event of a mental health crisis.  With written consent from the patient, two attempts were made to provide suicide prevention education, prior to and/or following the patient's discharge.  We were unsuccessful in providing suicide prevention education.  A suicide education pamphlet was given to the patient to share with family/significant other.  Date and time of first attempt:07/20/18, 1058 Date and time of second attempt:  Lorri Frederick, LCSW 07/20/2018, 10:59 AM

## 2018-07-20 NOTE — Progress Notes (Signed)
[facility] MD Progress Note  07/20/2018 11:05 AM [name] [name]  MRN:  [id number] Subjective:    Patient continues to deny and minimize all symptoms on the petition continues to seek discharge and again blames family members and then blames clinicians for listening to family members for keeping him here again very focused on discharge and minimizing symptoms.  Understands that methamphetamines cannot be used in the future but has shown no tendency toward sobriety previously. Principal Problem: Methamphetamine induced psychosis Diagnosis: Active Problems:   Schizoaffective disorder, bipolar type (HCC)  Total Time spent with patient: 20 minutes  Past Medical History:  Past Medical History:  Diagnosis Date  . Anxiety   . Depression   . Mental disorder   . Schizophrenia (HCC)    History reviewed. No pertinent surgical history. Family History:  Family History  Problem Relation Age of Onset  . Mental illness Neg Hx    Social History:  Social History   Substance and Sexual Activity  Alcohol Use Not Currently   Comment: haven't drank in months"     Social History   Substance and Sexual Activity  Drug Use Yes  . Types: Marijuana, Methamphetamines   Comment: Last used: 2 weeks ago    Social History   Socioeconomic History  . Marital status: Single    Spouse name: Not on file  . Number of children: Not on file  . Years of education: Not on file  . Highest education level: Not on file  Occupational History  . Not on file  Social Needs  . Financial resource strain: Not on file  . Food insecurity:    Worry: Not on file    Inability: Not on file  . Transportation needs:    Medical: Not on file    Non-medical: Not on file  Tobacco Use  . Smoking status: Current Every Day Smoker    Packs/day: 1.00    Types: Cigarettes  . Smokeless tobacco: Former Engineer, water and Sexual Activity  . Alcohol use: Not Currently    Comment: haven't drank in months"  . Drug use: Yes    Types:  Marijuana, Methamphetamines    Comment: Last used: 2 weeks ago  . Sexual activity: Yes    Birth control/protection: None  Lifestyle  . Physical activity:    Days per week: Not on file    Minutes per session: Not on file  . Stress: Not on file  Relationships  . Social connections:    Talks on phone: Not on file    Gets together: Not on file    Attends religious service: Not on file    Active member of club or organization: Not on file    Attends meetings of clubs or organizations: Not on file    Relationship status: Not on file  Other Topics Concern  . Not on file  Social History Narrative  . Not on file   Additional Social History:                         Sleep: Good  Appetite:  Good  Current Medications: Current Facility-Administered Medications  Medication Dose Route Frequency Provider Last Rate Last Dose  . acetaminophen (TYLENOL) tablet 650 mg  650 mg Oral Q6H PRN Charm Rings, NP      . alum & mag hydroxide-simeth (MAALOX/MYLANTA) 200-200-20 MG/5ML suspension 30 mL  30 mL Oral Q4H PRN Charm Rings, NP      .  amantadine (SYMMETREL) capsule 100 mg  100 mg Oral BID Charm Rings, NP   100 mg at 07/20/18 0800  . gabapentin (NEURONTIN) capsule 300 mg  300 mg Oral TID Charm Rings, NP   300 mg at 07/20/18 0800  . hydrOXYzine (ATARAX/VISTARIL) tablet 25 mg  25 mg Oral Q6H PRN Charm Rings, NP   25 mg at 07/19/18 2117  . magnesium hydroxide (MILK OF MAGNESIA) suspension 30 mL  30 mL Oral Daily PRN Charm Rings, NP      . perphenazine (TRILAFON) tablet 4 mg  4 mg Oral TID Malvin Johns, MD   4 mg at 07/20/18 0800  . traZODone (DESYREL) tablet 50 mg  50 mg Oral QHS PRN Charm Rings, NP   50 mg at 07/19/18 2117    Lab Results: No results found for this or any previous visit (from the past 48 hour(s)).  Blood Alcohol level:  Lab Results  Component Value Date   ETH <10 07/16/2018   ETH <10 12/16/2017    Metabolic Disorder Labs: Lab Results   Component Value Date   HGBA1C 5.7 (H) 12/19/2017   MPG 116.89 12/19/2017   MPG 128 04/02/2015   Lab Results  Component Value Date   PROLACTIN 45.8 (H) 12/19/2017   PROLACTIN 7.0 04/04/2015   Lab Results  Component Value Date   CHOL 182 12/19/2017   TRIG 204 (H) 12/19/2017   HDL 30 (L) 12/19/2017   CHOLHDL 6.1 12/19/2017   VLDL 41 (H) 12/19/2017   LDLCALC 111 (H) 12/19/2017   LDLCALC 96 04/04/2015    Physical Findings: AIMS: Facial and Oral Movements Muscles of Facial Expression: None, normal Lips and Perioral Area: None, normal Jaw: None, normal Tongue: None, normal,Extremity Movements Upper (arms, wrists, hands, fingers): None, normal Lower (legs, knees, ankles, toes): None, normal, Trunk Movements Neck, shoulders, hips: None, normal, Overall Severity Severity of abnormal movements (highest score from questions above): None, normal Incapacitation due to abnormal movements: None, normal Patient's awareness of abnormal movements (rate only patient's report): No Awareness, Dental Status Current problems with teeth and/or dentures?: No Does patient usually wear dentures?: No  CIWA:    COWS:     Musculoskeletal: Strength & Muscle Tone: within normal limits Gait & Station: normal Patient leans: N/A  Psychiatric Specialty Exam: Physical Exam  ROS  Blood pressure 128/76, pulse 73, temperature 98 F (36.7 C), temperature source Oral, resp. rate 18, height 5\' 3"  (1.6 m), weight 78.5 kg.Body mass index is 30.65 kg/m.  General Appearance: Casual  Eye Contact:  Good  Speech:  Clear and Coherent  Volume:  Normal  Mood:  Euthymic  Affect:  Appropriate  Thought Process:  Coherent  Orientation:  Full (Time, Place, and Person)  Thought Content:  Tangential  Suicidal Thoughts:  No  Homicidal Thoughts:  No  Memory:  NA  Judgement:  Good  Insight:  Fair  Psychomotor Activity:  Normal  Concentration:  Concentration: Good  Recall:  Good  Fund of Knowledge:  nl   Language:  Good  Akathisia:  Negative  Handed:  Right  AIMS (if indicated):     Assets:  Physical Health Resilience  ADL's:  Intact  Cognition:  WNL  Sleep:  Number of Hours: 6.25     Treatment Plan Summary: Daily contact with patient to assess and evaluate symptoms and progress in treatment, Medication management and Plan Continue current measures for psychosis and drug-induced psychosis again continue to engage in cognitive and rehab  based therapies patient continues to be in denial  Malvin JohnsFARAH,Brodi Kari, MD 07/20/2018, 11:05 AM

## 2018-07-20 NOTE — Progress Notes (Signed)
D: Pt denies SI/HI/AVH. Pt is pleasant and cooperative. Pt stated he was doing ok, pt visible in dayroom a little while this evening.   A: Pt was offered support and encouragement. Pt was given scheduled medications. Pt was encourage to attend groups. Q 15 minute checks were done for safety.   R:Pt attends groups and interacts well with peers and staff. Pt is taking medication. Pt has no complaints.Pt receptive to treatment and safety maintained on unit.   Problem: Education: Goal: Emotional status will improve Outcome: Progressing   Problem: Activity: Goal: Interest or engagement in activities will improve Outcome: Progressing   Problem: Activity: Goal: Sleeping patterns will improve Outcome: Progressing

## 2018-07-20 NOTE — Progress Notes (Signed)
Adult Psychoeducational Group Note  Date:  07/20/2018 Time:  9:08 PM  Group Topic/Focus:  Wrap-Up Group:   The focus of this group is to help patients review their daily goal of treatment and discuss progress on daily workbooks.  Participation Level:  Active  Participation Quality:  Appropriate  Affect:  Appropriate  Cognitive:  Appropriate  Insight: Appropriate  Engagement in Group:  Engaged  Modes of Intervention:  Discussion  Additional Comments: The patient expressed that he rates today a 8.  Octavio Manns 07/20/2018, 9:08 PM

## 2018-07-20 NOTE — Tx Team (Signed)
Interdisciplinary Treatment and Diagnostic Plan Update  07/20/2018 Time of Session: 0858 [name] MRN: [id number]  Principal Diagnosis: <principal problem not specified>  Secondary Diagnoses: Active Problems:   Schizoaffective disorder, bipolar type (HCC)   Current Medications:  Current Facility-Administered Medications  Medication Dose Route Frequency Provider Last Rate Last Dose  . acetaminophen (TYLENOL) tablet 650 mg  650 mg Oral Q6H PRN Charm Rings, NP      . alum & mag hydroxide-simeth (MAALOX/MYLANTA) 200-200-20 MG/5ML suspension 30 mL  30 mL Oral Q4H PRN Charm Rings, NP      . amantadine (SYMMETREL) capsule 100 mg  100 mg Oral BID Charm Rings, NP   100 mg at 07/20/18 0800  . gabapentin (NEURONTIN) capsule 300 mg  300 mg Oral TID Charm Rings, NP   300 mg at 07/20/18 1138  . hydrOXYzine (ATARAX/VISTARIL) tablet 25 mg  25 mg Oral Q6H PRN Charm Rings, NP   25 mg at 07/19/18 2117  . magnesium hydroxide (MILK OF MAGNESIA) suspension 30 mL  30 mL Oral Daily PRN Charm Rings, NP      . perphenazine (TRILAFON) tablet 4 mg  4 mg Oral TID Malvin Johns, MD   4 mg at 07/20/18 1138  . traZODone (DESYREL) tablet 50 mg  50 mg Oral QHS PRN Charm Rings, NP   50 mg at 07/19/18 2117   PTA Medications: Medications Prior to Admission  Medication Sig Dispense Refill Last Dose  . hydrOXYzine (ATARAX/VISTARIL) 50 MG tablet Take 1 tablet (50 mg total) by mouth every 6 (six) hours as needed for anxiety. (Patient not taking: Reported on 07/16/2018) 60 tablet 0 Not Taking at Unknown time  . nicotine (NICODERM CQ - DOSED IN MG/24 HOURS) 21 mg/24hr patch Place 1 patch (21 mg total) onto the skin daily. (May buy from over the counter): For smoking cessation (Patient not taking: Reported on 07/16/2018) 28 patch 0 Not Taking at Unknown time  . OLANZapine (ZYPREXA) 15 MG tablet Take 1 tablet (15 mg total) by mouth at bedtime. For mood control (Patient not taking: Reported on 07/16/2018) 30  tablet 0 Not Taking at Unknown time  . OLANZapine (ZYPREXA) 5 MG tablet Take 1 tablet (5 mg total) by mouth daily. For mood control (Patient not taking: Reported on 07/16/2018) 30 tablet 0 Not Taking at Unknown time  . traZODone (DESYREL) 50 MG tablet Take 1 tablet (50 mg total) by mouth at bedtime as needed for sleep. (Patient not taking: Reported on 07/16/2018) 30 tablet 0 Not Taking at Unknown time    Patient Stressors: Medication change or noncompliance Substance abuse  Patient Strengths: Ability for insight Average or above average intelligence Capable of independent living General fund of knowledge Motivation for treatment/growth Supportive family/friends  Treatment Modalities: Medication Management, Group therapy, Case management,  1 to 1 session with clinician, Psychoeducation, Recreational therapy.   Physician Treatment Plan for Primary Diagnosis: <principal problem not specified> Long Term Goal(s): Improvement in symptoms so as ready for discharge Improvement in symptoms so as ready for discharge   Short Term Goals: Ability to identify changes in lifestyle to reduce recurrence of condition will improve Ability to verbalize feelings will improve Ability to demonstrate self-control will improve Ability to maintain clinical measurements within normal limits will improve Compliance with prescribed medications will improve Ability to identify triggers associated with substance abuse/mental health issues will improve  Medication Management: Evaluate patient's response, side effects, and tolerance of medication regimen.  Therapeutic Interventions:  1 to 1 sessions, Unit Group sessions and Medication administration.  Evaluation of Outcomes: Progressing  Physician Treatment Plan for Secondary Diagnosis: Active Problems:   Schizoaffective disorder, bipolar type (HCC)  Long Term Goal(s): Improvement in symptoms so as ready for discharge Improvement in symptoms so as ready for  discharge   Short Term Goals: Ability to identify changes in lifestyle to reduce recurrence of condition will improve Ability to verbalize feelings will improve Ability to demonstrate self-control will improve Ability to maintain clinical measurements within normal limits will improve Compliance with prescribed medications will improve Ability to identify triggers associated with substance abuse/mental health issues will improve     Medication Management: Evaluate patient's response, side effects, and tolerance of medication regimen.  Therapeutic Interventions: 1 to 1 sessions, Unit Group sessions and Medication administration.  Evaluation of Outcomes: Progressing   RN Treatment Plan for Primary Diagnosis: <principal problem not specified> Long Term Goal(s): Knowledge of disease and therapeutic regimen to maintain health will improve  Short Term Goals: Ability to identify and develop effective coping behaviors will improve and Compliance with prescribed medications will improve  Medication Management: RN will administer medications as ordered by provider, will assess and evaluate patient's response and provide education to patient for prescribed medication. RN will report any adverse and/or side effects to prescribing provider.  Therapeutic Interventions: 1 on 1 counseling sessions, Psychoeducation, Medication administration, Evaluate responses to treatment, Monitor vital signs and CBGs as ordered, Perform/monitor CIWA, COWS, AIMS and Fall Risk screenings as ordered, Perform wound care treatments as ordered.  Evaluation of Outcomes: Progressing   LCSW Treatment Plan for Primary Diagnosis: <principal problem not specified> Long Term Goal(s): Safe transition to appropriate next level of care at discharge, Engage patient in therapeutic group addressing interpersonal concerns.  Short Term Goals: Engage patient in aftercare planning with referrals and resources, Increase social support and  Increase skills for wellness and recovery  Therapeutic Interventions: Assess for all discharge needs, 1 to 1 time with Social worker, Explore available resources and support systems, Assess for adequacy in community support network, Educate family and significant other(s) on suicide prevention, Complete Psychosocial Assessment, Interpersonal group therapy.  Evaluation of Outcomes: Progressing   Progress in Treatment: Attending groups: Yes. Participating in groups: No. Taking medication as prescribed: Yes. Toleration medication: Yes. Family/Significant other contact made: No, will contact:  father Patient understands diagnosis: No. Discussing patient identified problems/goals with staff: Yes. Medical problems stabilized or resolved: Yes. Denies suicidal/homicidal ideation: Yes. Issues/concerns per patient self-inventory: No. Other: none  New problem(s) identified: No, Describe:  none  New Short Term/Long Term Goal(s):  Patient Goals:  Stop smoking, stop using drugs  Discharge Plan or Barriers:   Reason for Continuation of Hospitalization: Aggression Delusions  Medication stabilization  Estimated Length of Stay: 2-4 days  Attendees: Patient:[name] [name] 07/20/2018   Physician: Dr Jeannine KittenFarah, MD 07/20/2018   Nursing: Sherryl Mangeslivette Wesseh, RN 07/20/2018   RN Care Manager: 07/20/2018   Social Worker: Daleen SquibbGreg Saranda Legrande, LCSW 07/20/2018   Recreational Therapist:  07/20/2018   Other:  07/20/2018   Other:  07/20/2018   Other: 07/20/2018        Scribe for Treatment Team: Lorri FrederickWierda, Gatlin Kittell Jon, LCSW 07/20/2018 2:38 PM

## 2018-07-21 MED ORDER — PALIPERIDONE PALMITATE ER 234 MG/1.5ML IM SUSY
234.0000 mg | PREFILLED_SYRINGE | Freq: Once | INTRAMUSCULAR | Status: AC
  Administered 2018-07-21: 234 mg via INTRAMUSCULAR
  Filled 2018-07-21: qty 1.5

## 2018-07-21 NOTE — [facility] Group Notes (Signed)
[facility] LCSW Group Therapy Note  Date/Time: 07/21/18, 1300  Type of Therapy/Topic:  Group Therapy:  Balance in Life  Participation Level:  active  Description of Group:    This group will address the concept of balance and how it feels and looks when one is unbalanced. Patients will be encouraged to process areas in their lives that are out of balance, and identify reasons for remaining unbalanced. Facilitators will guide patients utilizing problem- solving interventions to address and correct the stressor making their life unbalanced. Understanding and applying boundaries will be explored and addressed for obtaining  and maintaining a balanced life. Patients will be encouraged to explore ways to assertively make their unbalanced needs known to significant others in their lives, using other group members and facilitator for support and feedback.  Therapeutic Goals: 1. Patient will identify two or more emotions or situations they have that consume much of in their lives. 2. Patient will identify signs/triggers that life has become out of balance:  3. Patient will identify two ways to set boundaries in order to achieve balance in their lives:  4. Patient will demonstrate ability to communicate their needs through discussion and/or role plays  Summary of Patient Progress:Pt shared that physical, drugs, and family are all areas of stress for him.  Active during group discussion.             Therapeutic Modalities:   Cognitive Behavioral Therapy Solution-Focused Therapy Assertiveness Training  Daleen Squibb, [place]

## 2018-07-21 NOTE — Progress Notes (Signed)
D: Pt denies SI/HI/AV. Pt is pleasant and cooperative. Pt visible on the unit some this evening, pt kept to himself a lot . Pt continued to endorse depression.  A: Pt was offered support and encouragement.  Pt was encourage to attend groups. Q 15 minute checks were done for safety.  R:Pt attends groups and interacts well with peers and staff. Pt is taking medication. Pt has no complaints.Pt receptive to treatment and safety maintained on unit.  Problem: Safety: Goal: Ability to redirect hostility and anger into socially appropriate behaviors will improve Outcome: Progressing   Problem: Safety: Goal: Ability to remain free from injury will improve Outcome: Progressing   Problem: Activity: Goal: Interest or engagement in leisure activities will improve Outcome: Progressing

## 2018-07-21 NOTE — Progress Notes (Signed)
[facility]  07/21/2018 9:09 AM [name] ItoSimon [name]  MRN:  [id number] Subjective:    Generally stable in mood and affect argues that he should be discharged, family is fearful that he will relapse and again become volatile, however patient of course is clear as far as mental status exam-alert and oriented no thoughts of harming self or others no acute psychosis no paranoia, no thoughts of harming self or others as mentioned, no EPS or TD no involuntary movements  Principal Problem: Psychosis-drug-induced Diagnosis: Active Problems:   Schizoaffective disorder, bipolar type (HCC)  Total Time spent with patient: 20 minutes  Past Medical History:  Past Medical History:  Diagnosis Date  . Anxiety   . Depression   . Mental disorder   . Schizophrenia (HCC)    History reviewed. No pertinent surgical history. Family History:  Family History  Problem Relation Age of Onset  . Mental illness Neg Hx     Social History:  Social History   Substance and Sexual Activity  Alcohol Use Not Currently   Comment: haven't drank in months"     Social History   Substance and Sexual Activity  Drug Use Yes  . Types: Marijuana, Methamphetamines   Comment: Last used: 2 weeks ago    Social History   Socioeconomic History  . Marital status: Single    Spouse name: Not on file  . Number of children: Not on file  . Years of education: Not on file  . Highest education level: Not on file  Occupational History  . Not on file  Social Needs  . Financial resource strain: Not on file  . Food insecurity:    Worry: Not on file    Inability: Not on file  . Transportation needs:    Medical: Not on file    Non-medical: Not on file  Tobacco Use  . Smoking status: Current Every Day Smoker    Packs/day: 1.00    Types: Cigarettes  . Smokeless tobacco: Former Engineer, waterUser  Substance and Sexual Activity  . Alcohol use: Not Currently    Comment: haven't drank in months"  . Drug use: Yes    Types: Marijuana,  Methamphetamines    Comment: Last used: 2 weeks ago  . Sexual activity: Yes    Birth control/protection: None  Lifestyle  . Physical activity:    Days per week: Not on file    Minutes per session: Not on file  . Stress: Not on file  Relationships  . Social connections:    Talks on phone: Not on file    Gets together: Not on file    Attends religious service: Not on file    Active member of club or organization: Not on file    Attends meetings of clubs or organizations: Not on file    Relationship status: Not on file  Other Topics Concern  . Not on file  Social History Narrative  . Not on file   Additional Social History:                         Sleep: Fair  Appetite:  Good  Current Medications: Current Facility-Administered Medications  Medication Dose Route Frequency Provider Last Rate Last Dose  . acetaminophen (TYLENOL) tablet 650 mg  650 mg Oral Q6H PRN Charm RingsLord, Jamison Y, NP      . alum & mag hydroxide-simeth (MAALOX/MYLANTA) 200-200-20 MG/5ML suspension 30 mL  30 mL Oral Q4H PRN Charm RingsLord, Jamison Y, NP      .  amantadine (SYMMETREL) capsule 100 mg  100 mg Oral BID Charm Rings, NP   100 mg at 07/21/18 0801  . gabapentin (NEURONTIN) capsule 300 mg  300 mg Oral TID Charm Rings, NP   300 mg at 07/21/18 0801  . hydrOXYzine (ATARAX/VISTARIL) tablet 25 mg  25 mg Oral Q6H PRN Charm Rings, NP   25 mg at 07/19/18 2117  . magnesium hydroxide (MILK OF MAGNESIA) suspension 30 mL  30 mL Oral Daily PRN Charm Rings, NP      . perphenazine (TRILAFON) tablet 4 mg  4 mg Oral TID Malvin Johns, MD   4 mg at 07/21/18 0801  . traZODone (DESYREL) tablet 50 mg  50 mg Oral QHS PRN Charm Rings, NP   50 mg at 07/19/18 2117    Lab Results: No results found for this or any previous visit (from the past 48 hour(s)).  Blood Alcohol level:  Lab Results  Component Value Date   ETH <10 07/16/2018   ETH <10 12/16/2017    Metabolic Disorder Labs: Lab Results  Component  Value Date   HGBA1C 5.7 (H) 12/19/2017   MPG 116.89 12/19/2017   MPG 128 04/02/2015   Lab Results  Component Value Date   PROLACTIN 45.8 (H) 12/19/2017   PROLACTIN 7.0 04/04/2015   Lab Results  Component Value Date   CHOL 182 12/19/2017   TRIG 204 (H) 12/19/2017   HDL 30 (L) 12/19/2017   CHOLHDL 6.1 12/19/2017   VLDL 41 (H) 12/19/2017   LDLCALC 111 (H) 12/19/2017   LDLCALC 96 04/04/2015    Physical Findings: AIMS: Facial and Oral Movements Muscles of Facial Expression: None, normal Lips and Perioral Area: None, normal Jaw: None, normal Tongue: None, normal,Extremity Movements Upper (arms, wrists, hands, fingers): None, normal Lower (legs, knees, ankles, toes): None, normal, Trunk Movements Neck, shoulders, hips: None, normal, Overall Severity Severity of abnormal movements (highest score from questions above): None, normal Incapacitation due to abnormal movements: None, normal Patient's awareness of abnormal movements (rate only patient's report): No Awareness, Dental Status Current problems with teeth and/or dentures?: No Does patient usually wear dentures?: No  CIWA:    COWS:     Musculoskeletal: Strength & Muscle Tone: within normal limits Gait & Station: normal Patient leans: N/A  Psychiatric Specialty Exam: Physical Exam  Eyes: Right eye exhibits no discharge.    ROS  Blood pressure 122/86, pulse 76, temperature 98 F (36.7 C), temperature source Oral, resp. rate 18, height 5\' 3"  (1.6 m), weight 78.5 kg.Body mass index is 30.65 kg/m.  General Appearance: Casual  Eye Contact:  Good  Speech: Normal rate and tone  Volume:  Normal  Mood: Stable  mood  Affect:  Appropriate  Thought Process:  Goal Directed  Orientation:  Full (Time, Place, and Person)  Thought Content:  Tangential  Suicidal Thoughts:  No  Homicidal Thoughts:  No  Memory:  Immediate;   Fair  Judgement:  Fair  Insight:  Fair  Psychomotor Activity:  Normal  Concentration:  Concentration:  Good  Recall:  Good  Fund of Knowledge:  Good  Language:  Good  Akathisia:  Negative  Handed:  Right  AIMS (if indicated):     Assets:  Communication Skills Desire for Improvement  ADL's:  Intact  Cognition:  WNL  Sleep:  Number of Hours: 7     Treatment Plan Summary: Daily contact with patient to assess and evaluate symptoms and progress in treatment, Medication management and Plan  Generally stable but we will monitor 1 more day again family is insistent upon rehab and patient is resistant to this continue current antipsychotic therapy current rehab measures  Malvin JohnsFARAH,Kathy Wares, MD 07/21/2018, 9:09 AM

## 2018-07-21 NOTE — Plan of Care (Signed)
  Problem: Education: Goal: Emotional status will improve Outcome: Progressing Goal: Mental status will improve Outcome: Progressing   Problem: Education: Goal: Mental status will improve Outcome: Progressing  DAR NOTE: Patient presents with calm affect and pleasant mood.  Denies suicidal thoughts,pain, auditory and visual hallucinations.  Rates depression at 1, hopelessness at 1, and anxiety at 1.  Maintained on routine safety checks.  Medications given as prescribed.  Gean Birchwood given IM.  No adverse effect noted.  Support and encouragement offered as needed.  Attended group and participated.  States goal for today is "release, meds, appts when home."  Patient observed socializing with peers in the dayroom.  Offered no complaint.

## 2018-07-21 NOTE — Progress Notes (Signed)
Recreation Therapy Notes  Date: 1.16.20 Time: 1000 Location: 500 Hall Dayroom   Group Topic: Communication, Team Building, Problem Solving  Goal Area(s) Addresses:  Patient will effectively work with peer towards shared goal.  Patient will identify skill used to make activity successful.  Patient will identify how skills used during activity can be used to reach post d/c goals.   Behavioral Response: Engaged  Intervention: STEM Activity   Activity: [employer]. Patients were provided the following materials: 5 drinking straws, 5 rubber bands, 5 paper clips, 2 index cards, and 2 drinking cups. Using the provided materials patients were asked to build a launching mechanisms to launch a ping pong ball approximately 12 feet. Patients were divided into teams of 3-5.   Education: Pharmacist, community, Building control surveyor.   Education Outcome: Acknowledges education/In group clarification offered/Needs additional education.   Clinical Observations/Feedback: Pt was very engaged and active during group session.  Pt came up with the overall concept for his group.  Pt did listen to suggestions from his peers.  Pt was excited and proud of his accomplishment.    Caroll Rancher, LRT/CTRS     Caroll Rancher A 07/21/2018 12:09 PM

## 2018-07-21 NOTE — [facility] Suicide Risk Assessment (Signed)
[facility] INPATIENT:  Family/Significant Other Suicide Prevention Education  Suicide Prevention Education:  Education Completed; Donn PieriniSarah [name], sister, [phone],has been identified by the patient as the family member/significant other with whom the patient will be residing, and identified as the person(s) who will aid the patient in the event of a mental health crisis (suicidal ideations/suicide attempt).  With written consent from the patient, the family member/significant other has been provided the following suicide prevention education, prior to the and/or following the discharge of the patient.  The suicide prevention education provided includes the following:  Suicide risk factors  Suicide prevention and interventions  National Suicide Hotline telephone number  [facility] assessment telephone number  [facility] Assistance 911  [facility] and/or [facility]idential [facility] Crisis Unit telephone number  Request made of family/significant other to:  Remove weapons (e.g., guns, rifles, knives), all items previously/currently identified as safety concern.    Remove drugs/medications (over-the-counter, prescriptions, illicit drugs), all items previously/currently identified as a safety concern.  The family member/significant other verbalizes understanding of the suicide prevention education information provided.  The family member/significant other agrees to remove the items of safety concern listed above.  CSW discussed plan with pt sister.  She has spoken to him on the phone and he seems better but the family is concerned that once discharged he will stop taking meds and return to substance use.  Discussed TCT as discharge plan and sister liked this as an option to help support pt in the community.  Father will be the one picking him up tomorrow.   Lorri FrederickWierda, Kolbi Altadonna Jon, LCSW 07/21/2018, 2:24 PM

## 2018-07-22 MED ORDER — HYDROXYZINE HCL 25 MG PO TABS: 25.0000 mg | ORAL_TABLET | Freq: Four times a day (QID) | ORAL | 0 refills | Status: DC | PRN

## 2018-07-22 MED ORDER — PALIPERIDONE PALMITATE ER 234 MG/1.5ML IM SUSY: 156.0000 mg | PREFILLED_SYRINGE | Freq: Once | INTRAMUSCULAR | Status: DC

## 2018-07-22 MED ORDER — PALIPERIDONE ER 6 MG PO TB24: 6.0000 mg | ORAL_TABLET | Freq: Every day | ORAL | 0 refills | Status: DC

## 2018-07-22 MED ORDER — PALIPERIDONE PALMITATE ER 117 MG/0.75ML IM SUSY: 117.0000 mg | PREFILLED_SYRINGE | INTRAMUSCULAR | 0 refills | Status: DC

## 2018-07-22 MED ORDER — AMANTADINE HCL 100 MG PO CAPS: 100.0000 mg | ORAL_CAPSULE | Freq: Two times a day (BID) | ORAL | 0 refills | Status: DC

## 2018-07-22 MED ORDER — NICOTINE 21 MG/24HR TD PT24: 21.0000 mg | MEDICATED_PATCH | Freq: Every day | TRANSDERMAL | 0 refills | Status: DC

## 2018-07-22 MED ORDER — PALIPERIDONE PALMITATE ER 234 MG/1.5ML IM SUSY: 156.0000 mg | PREFILLED_SYRINGE | Freq: Once | INTRAMUSCULAR | 0 refills | Status: DC

## 2018-07-22 MED ORDER — TRAZODONE HCL 50 MG PO TABS: 50.0000 mg | ORAL_TABLET | Freq: Every evening | ORAL | 0 refills | Status: DC | PRN

## 2018-07-22 MED ORDER — PALIPERIDONE PALMITATE ER 117 MG/0.75ML IM SUSY: 117.0000 mg | PREFILLED_SYRINGE | INTRAMUSCULAR | Status: DC

## 2018-07-22 MED ORDER — GABAPENTIN 300 MG PO CAPS: 300.0000 mg | ORAL_CAPSULE | Freq: Three times a day (TID) | ORAL | 0 refills | Status: DC

## 2018-07-22 MED ORDER — PALIPERIDONE ER 6 MG PO TB24
6.0000 mg | ORAL_TABLET | Freq: Every day | ORAL | Status: DC
  Administered 2018-07-22: 6 mg via ORAL
  Filled 2018-07-22 (×2): qty 1

## 2018-07-22 NOTE — Plan of Care (Signed)
Discharge note  Patient verbalizes readiness for discharge. Follow up plan explained, AVS, Transition record and SRA given. Prescriptions and teaching provided. Belongings returned and signed for. Suicide safety plan completed and signed. Patient verbalizes understanding. Patient denies SI/HI and assures this Clinical research associatewriter he will seek assistance should that change. Patient discharged to lobby where father was waiting.  Problem: Education: Goal: Knowledge of [facility] Education information/materials will improve Outcome: Adequate for Discharge Goal: Emotional status will improve Outcome: Adequate for Discharge Goal: Mental status will improve Outcome: Adequate for Discharge Goal: Verbalization of understanding the information provided will improve Outcome: Adequate for Discharge   Problem: Activity: Goal: Interest or engagement in activities will improve Outcome: Adequate for Discharge Goal: Sleeping patterns will improve Outcome: Adequate for Discharge   Problem: Coping: Goal: Ability to verbalize frustrations and anger appropriately will improve Outcome: Adequate for Discharge Goal: Ability to demonstrate self-control will improve Outcome: Adequate for Discharge   Problem: Health Behavior/Discharge Planning: Goal: Identification of resources available to assist in meeting health care needs will improve Outcome: Adequate for Discharge Goal: Compliance with treatment plan for underlying cause of condition will improve Outcome: Adequate for Discharge   Problem: Physical Regulation: Goal: Ability to maintain clinical measurements within normal limits will improve Outcome: Adequate for Discharge   Problem: Safety: Goal: Periods of time without injury will increase Outcome: Adequate for Discharge   Problem: Activity: Goal: Will verbalize the importance of balancing activity with adequate rest periods Outcome: Adequate for Discharge   Problem: Education: Goal: Will be free  of psychotic symptoms Outcome: Adequate for Discharge Goal: Knowledge of the prescribed therapeutic regimen will improve Outcome: Adequate for Discharge   Problem: Coping: Goal: Coping ability will improve Outcome: Adequate for Discharge Goal: Will verbalize feelings Outcome: Adequate for Discharge   Problem: Health Behavior/Discharge Planning: Goal: Compliance with prescribed medication regimen will improve Outcome: Adequate for Discharge   Problem: Nutritional: Goal: Ability to achieve adequate nutritional intake will improve Outcome: Adequate for Discharge   Problem: Role Relationship: Goal: Ability to communicate needs accurately will improve Outcome: Adequate for Discharge Goal: Ability to interact with others will improve Outcome: Adequate for Discharge   Problem: Safety: Goal: Ability to redirect hostility and anger into socially appropriate behaviors will improve Outcome: Adequate for Discharge Goal: Ability to remain free from injury will improve Outcome: Adequate for Discharge   Problem: Self-Care: Goal: Ability to participate in self-care as condition permits will improve Outcome: Adequate for Discharge   Problem: Self-Concept: Goal: Will verbalize positive feelings about self Outcome: Adequate for Discharge   Problem: Education: Goal: Utilization of techniques to improve thought processes will improve Outcome: Adequate for Discharge Goal: Knowledge of the prescribed therapeutic regimen will improve Outcome: Adequate for Discharge   Problem: Activity: Goal: Interest or engagement in leisure activities will improve Outcome: Adequate for Discharge Goal: Imbalance in normal sleep/wake cycle will improve Outcome: Adequate for Discharge   Problem: Coping: Goal: Coping ability will improve Outcome: Adequate for Discharge Goal: Will verbalize feelings Outcome: Adequate for Discharge   Problem: Health Behavior/Discharge Planning: Goal: Ability to make  decisions will improve Outcome: Adequate for Discharge Goal: Compliance with therapeutic regimen will improve Outcome: Adequate for Discharge   Problem: Role Relationship: Goal: Will demonstrate positive changes in social behaviors and relationships Outcome: Adequate for Discharge   Problem: Safety: Goal: Ability to disclose and discuss suicidal ideas will improve Outcome: Adequate for Discharge Goal: Ability to identify and utilize support systems that promote safety will improve Outcome: Adequate  for Discharge   Problem: Self-Concept: Goal: Will verbalize positive feelings about self Outcome: Adequate for Discharge Goal: Level of anxiety will decrease Outcome: Adequate for Discharge   Problem: Education: Goal: Ability to make informed decisions regarding treatment will improve Outcome: Adequate for Discharge   Problem: Coping: Goal: Coping ability will improve Outcome: Adequate for Discharge   Problem: Health Behavior/Discharge Planning: Goal: Identification of resources available to assist in meeting health care needs will improve Outcome: Adequate for Discharge   Problem: Medication: Goal: Compliance with prescribed medication regimen will improve Outcome: Adequate for Discharge   Problem: Self-Concept: Goal: Ability to disclose and discuss suicidal ideas will improve Outcome: Adequate for Discharge Goal: Will verbalize positive feelings about self Outcome: Adequate for Discharge

## 2018-07-22 NOTE — [facility] Suicide Risk Assessment (Signed)
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  In fact without the influence of illicit compounds he would not present with the symptomatology but there was obvious dangerousness.  The patient was always in denial regarding the seriousness of his condition, family always requested that he go for long-term rehab and he always refused.  By the point of discharge he had been alert oriented cooperative throughout his stay no thoughts of harming self or others and psychosis had resolved.  Again no acute dangerousness and cannot be forced into rehab. He did however request long-acting injectable paliperidone and this was administered on 1/16    Mental Status Per Nursing Assessment::   On Admission:  NA  Demographic Factors:  Male  Loss Factors: NA  Historical Factors: Impulsivity  Risk Reduction Factors:   Religious beliefs about death  Continued Clinical Symptoms:  Alcohol/Substance Abuse/Dependencies  Cognitive Features That Contribute To Risk:  Thought constriction (tunnel vision)    Suicide Risk:  Minimal: No identifiable suicidal ideation.  Patients presenting with no risk factors but with morbid ruminations; may be classified as minimal risk based on the severity of the depressive symptoms  Follow-up Information    [facility] on 07/27/2018.   Why:  Your have been referred to the Transition Care Team.  Please attend your appointment on 01/22 at 8:00am.  Afterwards, please contact Estelle June, team lead, at [phone] to begin team services.  Contact information: [address] [place] [address] [phone]           Plan Of Care/Follow-up recommendations:  Activity:  full  Sameerah Nachtigal, MD 07/22/2018, 7:59 AM
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
## 2018-07-22 NOTE — Progress Notes (Signed)
Recreation Therapy Notes  INPATIENT RECREATION TR PLAN  Patient Details Name: [name] [name] MRN: [id number] DOB: 10/13/1980 Today's Date: 07/22/2018  Rec Therapy Plan Is patient appropriate for Therapeutic Recreation?: Yes Treatment times per week: about 3 days Estimated Length of Stay: 5-7 days TR Treatment/Interventions: Group participation (Comment)  Discharge Criteria Pt will be discharged from therapy if:: Discharged Treatment plan/goals/alternatives discussed and agreed upon by:: Patient/family  Discharge Summary Short term goals set: See patient care plan. Short term goals met: Complete Progress toward goals comments: Groups attended Which groups?: Coping skills, Communication, Wellness, Other (Comment)(Team building) Reason goals not met: None Therapeutic equipment acquired: N/A Reason patient discharged from therapy: Discharge from hospital Pt/family agrees with progress & goals achieved: Yes Date patient discharged from therapy: 07/22/18     Victorino Sparrow, LRT/CTRS  Ria Comment, Isadore Palecek A 07/22/2018, 11:13 AM

## 2018-07-22 NOTE — Plan of Care (Signed)
Pt was able to identify benefits of using appropriate anger management techniques.    Lori Popowski, LRT/CTRS 

## 2018-07-22 NOTE — Progress Notes (Signed)
  [facility] Adult Case Management Discharge Plan :  Will you be returning to the same living situation after discharge:  Yes,  with family At discharge, do you have transportation home?: Yes,  father Do you have the ability to pay for your medications: Yes,  medicare/medicaid  Release of information consent forms completed and in the chart;  Patient's signature needed at discharge.  Patient to Follow up at: Follow-up Information    [facility]. Go on 07/27/2018.   Why:  Your have been referred to the Transition Care Team.  Please attend your appointment on 01/22 at 8:00am.  Afterwards, please contact Estelle June, team lead, at [phone] to begin team services.  Contact information: [address] [place] [address] [phone]           Next level of care provider has access to [facility] Link:no  Safety Planning and Suicide Prevention discussed: Yes,  with sister  Have you used any form of tobacco in the last 30 days? (Cigarettes, Smokeless Tobacco, Cigars, and/or Pipes): Yes  Has patient been referred to the Quitline?: Patient refused referral  Patient has been referred for addiction treatment: Yes  Lorri Frederick, LCSW 07/22/2018, 9:46 AM

## 2018-07-22 NOTE — Progress Notes (Addendum)
Recreation Therapy Notes  Date: 1.17.20 Time: 1000 Location: 500 Hall Dayroom  Group Topic: Coping Skills  Goal Area(s) Addresses:  Pt will identify positive coping skills. Pt will identify benefits of coping skills. Pt will identify benefit of using coping skills post d/c.  Behavioral Response: Engaged  Intervention: Worksheet  Activity: Coping Skills A - Z.  Patient was given a worksheet with the alphabet.  Patient was to identify a coping skill that could be used for each letter of the alphabet.    Education: Pharmacologist, Building control surveyor.   Education Outcome: Acknowledges understanding/In group clarification offered/Needs additional education.   Clinical Observations/Feedback: Pt was bright and upbeat during group session.  Pt was social with peers.  Pt identified some of his coping skills as helping others, appetite, crushing cans, being on Iphone, and eating quaker oats. Pt expressed using bad coping skills cause destruction, problems and makes situations worse.      Caroll Rancher, LRT/CTRS      Lillia Abed, Eluzer Howdeshell A 07/22/2018 10:50 AM

## 2018-07-28 ENCOUNTER — Other Ambulatory Visit: Payer: Self-pay

## 2018-07-28 ENCOUNTER — Emergency Department (HOSPITAL_COMMUNITY)
Admission: EM | Admit: 2018-07-28 | Discharge: 2018-07-29 | Disposition: A | Discharge status: Other Acute Inpatient Hospital | Payer: Medicare Other | Attending: Emergency Medicine | Admitting: Emergency Medicine

## 2018-07-28 ENCOUNTER — Encounter (HOSPITAL_COMMUNITY): Payer: Self-pay | Admitting: Emergency Medicine

## 2018-07-28 DIAGNOSIS — Z79899 Other long term (current) drug therapy: Secondary | ICD-10-CM | POA: Insufficient documentation

## 2018-07-28 DIAGNOSIS — F1020 Alcohol dependence, uncomplicated: Secondary | ICD-10-CM | POA: Insufficient documentation

## 2018-07-28 DIAGNOSIS — F1221 Cannabis dependence, in remission: Secondary | ICD-10-CM | POA: Insufficient documentation

## 2018-07-28 DIAGNOSIS — F17210 Nicotine dependence, cigarettes, uncomplicated: Secondary | ICD-10-CM | POA: Insufficient documentation

## 2018-07-28 DIAGNOSIS — F15159 Other stimulant abuse with stimulant-induced psychotic disorder, unspecified: Secondary | ICD-10-CM | POA: Diagnosis not present

## 2018-07-28 DIAGNOSIS — F209 Schizophrenia, unspecified: Secondary | ICD-10-CM | POA: Insufficient documentation

## 2018-07-28 DIAGNOSIS — R45850 Homicidal ideations: Secondary | ICD-10-CM | POA: Diagnosis not present

## 2018-07-28 DIAGNOSIS — F22 Delusional disorders: Secondary | ICD-10-CM | POA: Diagnosis not present

## 2018-07-28 DIAGNOSIS — F419 Anxiety disorder, unspecified: Secondary | ICD-10-CM | POA: Insufficient documentation

## 2018-07-28 DIAGNOSIS — R45851 Suicidal ideations: Secondary | ICD-10-CM | POA: Diagnosis not present

## 2018-07-28 DIAGNOSIS — F329 Major depressive disorder, single episode, unspecified: Secondary | ICD-10-CM | POA: Insufficient documentation

## 2018-07-28 DIAGNOSIS — F1410 Cocaine abuse, uncomplicated: Secondary | ICD-10-CM | POA: Insufficient documentation

## 2018-07-28 DIAGNOSIS — F250 Schizoaffective disorder, bipolar type: Secondary | ICD-10-CM | POA: Insufficient documentation
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
## 2018-07-29 ENCOUNTER — Inpatient Hospital Stay (HOSPITAL_COMMUNITY)
Admission: AD | Admit: 2018-07-29 | Discharge: 2018-08-04 | DRG: 897 | Disposition: A | Discharge status: Home / Self Care | Payer: Medicare Other | Source: Intra-hospital | Attending: Psychiatry | Admitting: Psychiatry

## 2018-07-29 ENCOUNTER — Encounter (HOSPITAL_COMMUNITY): Payer: Self-pay

## 2018-07-29 DIAGNOSIS — Z9114 Patient's other noncompliance with medication regimen: Secondary | ICD-10-CM | POA: Diagnosis not present

## 2018-07-29 DIAGNOSIS — G4700 Insomnia, unspecified: Secondary | ICD-10-CM | POA: Diagnosis present

## 2018-07-29 DIAGNOSIS — F15159 Other stimulant abuse with stimulant-induced psychotic disorder, unspecified: Principal | ICD-10-CM | POA: Diagnosis present

## 2018-07-29 DIAGNOSIS — F329 Major depressive disorder, single episode, unspecified: Secondary | ICD-10-CM | POA: Diagnosis present

## 2018-07-29 DIAGNOSIS — F17210 Nicotine dependence, cigarettes, uncomplicated: Secondary | ICD-10-CM | POA: Diagnosis present

## 2018-07-29 DIAGNOSIS — F15959 Other stimulant use, unspecified with stimulant-induced psychotic disorder, unspecified: Secondary | ICD-10-CM | POA: Diagnosis present

## 2018-07-29 DIAGNOSIS — F1210 Cannabis abuse, uncomplicated: Secondary | ICD-10-CM | POA: Diagnosis present

## 2018-07-29 LAB — CBC
HEMATOCRIT: 43.5 % (ref 39.0–52.0)
Hemoglobin: 13.6 g/dL (ref 13.0–17.0)
MCH: 25.4 pg — ABNORMAL LOW (ref 26.0–34.0)
MCHC: 31.3 g/dL (ref 30.0–36.0)
MCV: 81.2 fL (ref 80.0–100.0)
Platelets: 262 10*3/uL (ref 150–400)
RBC: 5.36 MIL/uL (ref 4.22–5.81)
RDW: 13.1 % (ref 11.5–15.5)
WBC: 6.1 10*3/uL (ref 4.0–10.5)
nRBC: 0 % (ref 0.0–0.2)

## 2018-07-29 LAB — COMPREHENSIVE METABOLIC PANEL
ALBUMIN: 4.4 g/dL (ref 3.5–5.0)
ALT: 56 U/L — ABNORMAL HIGH (ref 0–44)
AST: 23 U/L (ref 15–41)
Alkaline Phosphatase: 57 U/L (ref 38–126)
Anion gap: 9 (ref 5–15)
BUN: 15 mg/dL (ref 6–20)
CO2: 24 mmol/L (ref 22–32)
Calcium: 9.5 mg/dL (ref 8.9–10.3)
Chloride: 105 mmol/L (ref 98–111)
Creatinine, Ser: 0.97 mg/dL (ref 0.61–1.24)
GFR calc Af Amer: >60 mL/min (ref >60)
GFR calc non Af Amer: >60 mL/min (ref >60)
Glucose, Bld: 97 mg/dL (ref 70–99)
Potassium: 3.6 mmol/L (ref 3.5–5.1)
Sodium: 138 mmol/L (ref 135–145)
Total Bilirubin: 0.6 mg/dL (ref 0.3–1.2)
Total Protein: 7.5 g/dL (ref 6.5–8.1)

## 2018-07-29 LAB — RAPID URINE DRUG SCREEN, HOSP PERFORMED
Amphetamines: POSITIVE — AB
BARBITURATES: NOT DETECTED
Benzodiazepines: NOT DETECTED
Cocaine: NOT DETECTED
Opiates: NOT DETECTED
Tetrahydrocannabinol: NOT DETECTED

## 2018-07-29 LAB — ETHANOL: Alcohol, Ethyl (B): <10 mg/dL (ref <10)

## 2018-07-29 MED ORDER — CLONAZEPAM 1 MG PO TABS
2.0000 mg | ORAL_TABLET | Freq: Once | ORAL | Status: AC
  Administered 2018-07-29: 2 mg via ORAL
  Filled 2018-07-29: qty 2

## 2018-07-29 MED ORDER — PALIPERIDONE ER 6 MG PO TB24
6.0000 mg | ORAL_TABLET | Freq: Every day | ORAL | Status: DC
  Administered 2018-07-29: 6 mg via ORAL
  Filled 2018-07-29: qty 1

## 2018-07-29 MED ORDER — HYDROXYZINE HCL 25 MG PO TABS: 25.0000 mg | ORAL_TABLET | Freq: Four times a day (QID) | ORAL | Status: DC | PRN

## 2018-07-29 MED ORDER — RISPERIDONE 3 MG PO TABS
3.0000 mg | ORAL_TABLET | Freq: Two times a day (BID) | ORAL | Status: DC
  Administered 2018-07-29 – 2018-07-31 (×4): 3 mg via ORAL
  Filled 2018-07-29 (×4): qty 1
  Filled 2018-07-29: qty 3
  Filled 2018-07-29 (×4): qty 1

## 2018-07-29 MED ORDER — TRAZODONE HCL 50 MG PO TABS: 50.0000 mg | ORAL_TABLET | Freq: Every evening | ORAL | Status: DC | PRN

## 2018-07-29 MED ORDER — PALIPERIDONE PALMITATE ER 234 MG/1.5ML IM SUSY
156.0000 mg | PREFILLED_SYRINGE | Freq: Once | INTRAMUSCULAR | Status: DC
  Filled 2018-07-29: qty 1.5

## 2018-07-29 MED ORDER — GABAPENTIN 300 MG PO CAPS
300.0000 mg | ORAL_CAPSULE | Freq: Three times a day (TID) | ORAL | Status: DC
  Administered 2018-07-29: 300 mg via ORAL
  Filled 2018-07-29: qty 1

## 2018-07-29 MED ORDER — ALUM & MAG HYDROXIDE-SIMETH 200-200-20 MG/5ML PO SUSP: 30.0000 mL | ORAL | Status: DC | PRN

## 2018-07-29 MED ORDER — AMANTADINE HCL 100 MG PO CAPS
100.0000 mg | ORAL_CAPSULE | Freq: Two times a day (BID) | ORAL | Status: DC
  Administered 2018-07-29: 100 mg via ORAL
  Filled 2018-07-29: qty 1

## 2018-07-29 MED ORDER — ACETAMINOPHEN 325 MG PO TABS
650.0000 mg | ORAL_TABLET | Freq: Four times a day (QID) | ORAL | Status: DC | PRN
  Administered 2018-07-30 – 2018-07-31 (×2): 650 mg via ORAL
  Filled 2018-07-29 (×2): qty 2

## 2018-07-29 MED ORDER — NICOTINE 21 MG/24HR TD PT24
21.0000 mg | MEDICATED_PATCH | Freq: Every day | TRANSDERMAL | Status: DC
  Administered 2018-07-29: 21 mg via TRANSDERMAL
  Filled 2018-07-29: qty 1

## 2018-07-29 MED ORDER — BENZTROPINE MESYLATE 0.5 MG PO TABS
0.5000 mg | ORAL_TABLET | Freq: Two times a day (BID) | ORAL | Status: DC
  Administered 2018-07-29 – 2018-08-01 (×6): 0.5 mg via ORAL
  Filled 2018-07-29 (×12): qty 1

## 2018-07-29 MED ORDER — HYDROXYZINE HCL 50 MG PO TABS
50.0000 mg | ORAL_TABLET | Freq: Three times a day (TID) | ORAL | Status: DC | PRN
  Administered 2018-07-31 – 2018-08-03 (×3): 50 mg via ORAL
  Filled 2018-07-29 (×3): qty 1

## 2018-07-29 MED ORDER — MAGNESIUM HYDROXIDE 400 MG/5ML PO SUSP: 30.0000 mL | Freq: Every day | ORAL | Status: DC | PRN

## 2018-07-29 MED ORDER — TRAZODONE HCL 150 MG PO TABS
300.0000 mg | ORAL_TABLET | Freq: Every day | ORAL | Status: DC
  Administered 2018-07-29 – 2018-08-03 (×6): 300 mg via ORAL
  Filled 2018-07-29 (×9): qty 2

## 2018-07-29 NOTE — Progress Notes (Signed)
07/29/2018  0950  Called [facility] [phone]. Report given to Tinika.

## 2018-07-29 NOTE — Progress Notes (Signed)
Patient ID: [name] ItoSimon [name], male   DOB: August 20, 1980, 38 y.o.   MRN: [id number] Per State regulations 482.30 this chart was reviewed for medical necessity with respect to the patient's admission/duration of stay.    Next review date: 08/02/2018  Thurman CoyerEric Kritika Stukes, BSN, RN-BC  Case Manager

## 2018-07-29 NOTE — Progress Notes (Signed)
24 hour facility exam for IVC placed on patient chart.

## 2018-07-29 NOTE — [facility] Suicide Risk Assessment (Signed)
[facility] Admission Suicide Risk Assessment   Nursing information obtained from:    Demographic factors:    Current Mental Status:    Loss Factors:    Historical Factors:    Risk Reduction Factors:     Total Time spent with patient: 30 minutes Principal Problem: Psychosis in the context of relapse with regards to methamphetamines Diagnosis:  Active Problems:   Methamphetamine-induced psychotic disorder (HCC)  Subjective Data:  Patient well-known to the service he had stabilized very well and was here last week family had insisted he needed longer-term rehab but he always refused he once again relapsed and became quite psychotic disorganized to clear danger to self, he denies suicidal and homicidal thoughts his greatest danger of overdose is an unintentional 1 however Continued Clinical Symptoms:    The "Alcohol Use Disorders Identification Test", Guidelines for Use in Primary Care, Second Edition.  World Science writer [facility]). Score between 0-7:  no or low risk or alcohol related problems. Score between 8-15:  moderate risk of alcohol related problems. Score between 16-19:  high risk of alcohol related problems. Score 20 or above:  warrants further diagnostic evaluation for alcohol dependence and treatment.   CLINICAL FACTORS: drug-induced psychosis    COGNITIVE FEATURES THAT CONTRIBUTE TO RISK:  Thought constriction (tunnel vision)    SUICIDE RISK:   Minimal: No identifiable suicidal ideation.  Patients presenting with no risk factors but with morbid ruminations; may be classified as minimal risk based on the severity of the depressive symptoms  PLAN OF CARE: needs inpt care  I certify that inpatient services furnished can reasonably be expected to improve the patient's condition.   Malvin Johns, MD 07/29/2018, 11:20 AM

## 2018-07-29 NOTE — Progress Notes (Signed)
[facility]. Attending provider will be Dr. Jeannine KittenFarah, MD. Call to report [phone]. Kathryne HitchLondon, Hedy JacobLatricia L, RN has been advised.  Princess BruinsAquicha Yarexi Pawlicki, MSW, LCSW Therapeutic Triage Specialist  [phone]

## 2018-07-29 NOTE — Progress Notes (Signed)
07/29/2018  0956 Called Police [phone] [facility]
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
## 2018-07-29 NOTE — [facility] Notes (Signed)
IVC paperwork faxed to [facility] 

## 2018-07-29 NOTE — ED Provider Notes (Signed)
[facility]-EMERGENCY DEPT Provider Note  CSN: [id number] Arrival date & time: 07/28/18 2342  Chief Complaint(s) IVC/SI  HPI [name] [name] is a 38 y.o. male with a history of schizoaffective disorder and polysubstance abuse who presents after being IVC by family for suicidal and homicidal threats as well as delusions, stating that "ghost and the devil is squeezing his head."  Patient was recently admitted and discharged from behavioral health for similar presentation.  Patient is a poor historian with tangential speech.  Level 5 caveat due to psychosis.  HPI  Past Medical History Past Medical History:  Diagnosis Date  . Anxiety   . Depression   . Mental disorder   . Schizophrenia [facility](HCC)    Patient Active Problem List   Diagnosis Date Noted  . 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 Schizophrenia, paranoid type (HCC) 12/17/2017  . Schizoaffective disorder, bipolar type (HCC) 05/07/2016  . Cocaine abuse (HCC) 05/07/2016  . Cannabis use disorder, moderate, in sustained remission (HCC) 04/03/2015  . Alcohol use disorder, moderate, in sustained remission (HCC) 04/03/2015   Home Medication(s) Prior to Admission medications   Medication Sig Start Date End Date Taking? Authorizing Provider  amantadine (SYMMETREL) 100 MG capsule Take 1 capsule (100 mg total) by mouth 2 (two) times daily. For prevention of drug induced tremors 07/22/18  Yes Nwoko, Nicole KindredAgnes I, NP  gabapentin (NEURONTIN) 300 MG capsule Take 1 capsule (300 mg total) by mouth 3 (three) times daily. For agitation 07/22/18  Yes Armandina StammerNwoko, Agnes I, NP  hydrOXYzine (ATARAX/VISTARIL) 25 MG tablet Take 1 tablet (25 mg total) by mouth every 6 (six) hours as needed for anxiety. 07/22/18  Yes Armandina StammerNwoko, Agnes I, NP  paliperidone (INVEGA) 6 MG 24 hr tablet Take 1 tablet (6 mg total) by mouth daily. For mood control 07/22/18  Yes Armandina StammerNwoko, Agnes I, NP  traZODone (DESYREL) 50 MG tablet  Take 1 tablet (50 mg total) by mouth at bedtime as needed for sleep. 07/22/18  Yes Nwoko, Nicole KindredAgnes I, NP  nicotine (NICODERM CQ - DOSED IN MG/24 HOURS) 21 mg/24hr patch Place 1 patch (21 mg total) onto the skin daily. (May buy from over the counter): For smoking cessation Patient not taking: Reported on 07/29/2018 07/22/18   Armandina StammerNwoko, Agnes I, NP  paliperidone (INVEGA SUSTENNA) 234 MG/1.5ML SUSY injection Inject 156 mg into the muscle once for 1 dose. (Due on 07-29-18): For mood control Patient not taking: Reported on 07/29/2018 07/29/18 07/29/18  Armandina StammerNwoko, Agnes I, NP  Paliperidone ER (INVEGA SUSTENNA) injection Inject 117 mg into the muscle every 28 (twenty-eight) days. (Due on 08-22-18): For mood control Patient not taking: Reported on 07/29/2018 08/22/18   Sanjuana KavaNwoko, Agnes I, NP                                                                                                                                    Past Surgical  History History reviewed. No pertinent surgical history. Family History Family History  Problem Relation Age of Onset  . Mental illness Neg Hx     Social History Social History   Tobacco Use  . Smoking status: Current Every Day Smoker    Packs/day: 1.00    Types: Cigarettes  . Smokeless tobacco: Former Engineer, waterUser  Substance Use Topics  . Alcohol use: Not Currently    Comment: haven't drank in months"  . Drug use: Yes    Types: Marijuana, Methamphetamines    Comment: Last used: 2 weeks ago   Allergies Patient has no known allergies.  Review of Systems Review of Systems  Unable to perform ROS: Psychiatric disorder    Physical Exam Vital Signs  I have reviewed the triage vital signs BP 133/87 (BP Location: Left Arm)   Pulse 99   Temp 98.7 F (37.1 C) (Oral)   Resp 18   SpO2 97%   Physical Exam Vitals signs reviewed.  Constitutional:      General: He is not in acute distress.    Appearance: He is well-developed. He is not diaphoretic.  HENT:     Head: Normocephalic and  atraumatic.     Nose: Nose normal.  Eyes:     General: No scleral icterus.       Right eye: No discharge.        Left eye: No discharge.     Conjunctiva/sclera: Conjunctivae normal.     Pupils: Pupils are equal, round, and reactive to light.  Neck:     Musculoskeletal: Normal range of motion and neck supple.  Cardiovascular:     Rate and Rhythm: Normal rate and regular rhythm.     Heart sounds: No murmur. No friction rub. No gallop.   Pulmonary:     Effort: Pulmonary effort is normal. No respiratory distress.     Breath sounds: Normal breath sounds. No stridor. No rales.  Abdominal:     General: There is no distension.     Palpations: Abdomen is soft.     Tenderness: There is no abdominal tenderness.  Musculoskeletal:        General: No tenderness.  Skin:    General: Skin is warm and dry.     Findings: No erythema or rash.  Neurological:     Mental Status: He is alert and oriented to person, place, and time.  Psychiatric:        Mood and Affect: Mood normal.        Speech: Speech is tangential.        Behavior: Behavior is not agitated or aggressive. Behavior is cooperative.        Thought Content: Thought content is delusional. Thought content does not include homicidal or suicidal ideation. Thought content does not include homicidal or suicidal plan.     ED Results and Treatments Labs (all labs ordered are listed, but only abnormal results are displayed) Labs Reviewed  COMPREHENSIVE METABOLIC PANEL - Abnormal; Notable for the following components:      Result Value   ALT 56 (*)    All other components within normal limits  CBC - Abnormal; Notable for the following components:   MCH 25.4 (*)    All other components within normal limits  ETHANOL  RAPID URINE DRUG SCREEN, HOSP PERFORMED  EKG  EKG Interpretation  Date/Time:    Ventricular Rate:      PR Interval:    QRS Duration:   QT Interval:    QTC Calculation:   R Axis:     Text Interpretation:        Radiology No results found. Pertinent labs & imaging results that were available during my care of the patient were reviewed by me and considered in my medical decision making (see chart for details).  Medications Ordered in ED Medications  amantadine (SYMMETREL) capsule 100 mg (has no administration in time range)  gabapentin (NEURONTIN) capsule 300 mg (has no administration in time range)  hydrOXYzine (ATARAX/VISTARIL) tablet 25 mg (has no administration in time range)  nicotine (NICODERM CQ - dosed in mg/24 hours) patch 21 mg (has no administration in time range)  paliperidone (INVEGA) 24 hr tablet 6 mg (has no administration in time range)  paliperidone (INVEGA SUSTENNA) injection 156 mg (has no administration in time range)  traZODone (DESYREL) tablet 50 mg (has no administration in time range)                                                                                                                                    Procedures Procedures  (including critical care time)  Medical Decision Making / ED Course I have reviewed the nursing notes for this encounter and the patient's prior records (if available in EHR or on provided paperwork).    Patient is IVC by family due to homicidal and suicidal threats as well as delusions.  Appears to be decompensated.  IVC upheld.  Screening labs obtain and behavioral health consulted.   Labs reassuring.  Medically clear for behavioral health evaluation.  Behavioral health determined patient requires inpatient admission. Home meds ordered.   Final Clinical Impression(s) / ED Diagnoses Final diagnoses:  Homicidal ideation  Suicidal ideation  Delusion (HCC)      This chart was dictated using voice recognition software.  Despite best efforts to proofread,  errors can occur which can change the documentation meaning.    Nira Conn, MD 07/29/18 [phone]

## 2018-07-29 NOTE — ED Notes (Signed)
Pt under IVC, by father, presents with SI, threatened to slit his throat and kill the father and entire family.  Pt reports he abuses Meth, crack and Marijuana.  Awake, alert & responsive, no distress noted, calm & cooperative at present.  Monitoring for safety, 1-1 sitter at bedside.  Dr Eudelia Bunchardama at bedside to eval pt.

## 2018-07-29 NOTE — H&P (Signed)
Psychiatric Admission Assessment Adult  Patient Identification: [name] [name] MRN:  [id number] Date of Evaluation:  07/29/2018 Chief Complaint:  Schizoaffective Principal Diagnosis: Psychosis drug-induced Diagnosis:  Active Problems:   Methamphetamine-induced psychotic disorder (HCC)  History of Present Illness:   This 38 year old male is well-known to the service was actually last here and discharged on 1/17 but unfortunately relapsed again on methamphetamines, and is suffering from a drug-induced psychosis again this is a pretty similar presentation for him. At the point of discharge she refused long-term rehab and inpatient rehab stays The chief complaint involved threats to kill his father and even his entire family, abuse of cannabis methamphetamines and crack cocaine although drug screen only shows the methamphetamines. In the emergency department as recently as last evening he was quite delusional stating that ghosts and doubles were squeezing his head and again was tangential disorganized paranoid and psychotic According to the assessment team [name] [name] is an 38 y.o. male who presents to the ED under IVC initiated by his father. Per IVC, respondent "diagnosed schizophrenic, threatening to slit his throat with a knife and kill his father and entire family and throwing things at them. Believes he sees ghost and the devil is "squeezing his head". Using marijuana, meth, and crack when he can get it."   Pt initially asks TTS why he is in the ED and states he does not recall being brought in. Pt asks this Clinical research associate "where am I? Why am I here? Pt states he recalls being angry and making threats because he feels helpless. Pt states he has been abusing substances including cannabis and meth. Pt states he feels paranoid that his wife is stealing their marriage license and believes she is going to divorce him. Pt states he wants to be clean from all drugs but he feels helpless. Pt reports he has been  admitted to multiple inpt facilities in the past due to depression, substance abuse, and drug induced psychosis. Pt was d/c from [facility] on 07/22/2018 c/o schizoaffective disorder.   When I interviewed the patient he simply insist that smoking is his problem and he needs a smoking patch however he is rambling pressured and irritable agitated on the phone with perhaps family members  Associated Signs/Symptoms: Depression Symptoms:  psychomotor agitation, (Hypo) Manic Symptoms:  Flight of Ideas, Anxiety Symptoms:  n/a Psychotic Symptoms:  Delusions, Hallucinations: Auditory PTSD Symptoms: NA Total Time spent with patient: 45 minutes  Is the patient at risk to self? Yes.    Has the patient been a risk to self in the past 6 months? Yes.    Has the patient been a risk to self within the distant past? Yes.    Is the patient a risk to others? Yes.    Has the patient been a risk to others in the past 6 months? Yes.    Has the patient been a risk to others within the distant past? Yes.    Alcohol Screening:   Substance Abuse History in the last 12 months:  Yes.   Consequences of Substance Abuse: Medical Consequences:  Psychosis Family Consequences:  Alienation and fear of family Previous Psychotropic Medications: Yes  Psychological Evaluations: No  Past Medical History:  Past Medical History:  Diagnosis Date  . Anxiety   . Depression   . Mental disorder   . Schizophrenia (HCC)    No past surgical history on file. Family History:  Family History  Problem Relation Age of Onset  . Mental illness Neg Hx  Tobacco Screening:   Social History:  Social History   Substance and Sexual Activity  Alcohol Use Not Currently   Comment: haven't drank in months"     Social History   Substance and Sexual Activity  Drug Use Yes  . Types: Marijuana, Methamphetamines   Comment: Last used: 2 weeks ago    Additional Social History:                           Allergies:  No Known  Allergies Lab Results:  Results for orders placed or performed during the hospital encounter of 07/28/18 (from the past 48 hour(s))  Comprehensive metabolic panel     Status: Abnormal   Collection Time: 07/29/18 12:29 AM  Result Value Ref Range   Sodium 138 135 - 145 mmol/L   Potassium 3.6 3.5 - 5.1 mmol/L   Chloride 105 98 - 111 mmol/L   CO2 24 22 - 32 mmol/L   Glucose, Bld 97 70 - 99 mg/dL   BUN 15 6 - 20 mg/dL   Creatinine, Ser 8.52 0.61 - 1.24 mg/dL   Calcium 9.5 8.9 - 77.8 mg/dL   Total Protein 7.5 6.5 - 8.1 g/dL   Albumin 4.4 3.5 - 5.0 g/dL   AST 23 15 - 41 U/L   ALT 56 (H) 0 - 44 U/L   Alkaline Phosphatase 57 38 - 126 U/L   Total Bilirubin 0.6 0.3 - 1.2 mg/dL   GFR calc non Af Amer >60 >60 mL/min   GFR calc Af Amer >60 >60 mL/min   Anion gap 9 5 - 15    Comment: Performed at [facility], [address] [place], [place] [address]  Ethanol     Status: None   Collection Time: 07/29/18 12:29 AM  Result Value Ref Range   Alcohol, Ethyl (B) <10 <10 mg/dL    Comment: (NOTE) Lowest detectable limit for serum alcohol is 10 mg/dL. For medical purposes only. Performed at [facility], [address] [place], [place] [address]   cbc     Status: Abnormal   Collection Time: 07/29/18 12:29 AM  Result Value Ref Range   WBC 6.1 4.0 - 10.5 K/uL   RBC 5.36 4.22 - 5.81 MIL/uL   Hemoglobin 13.6 13.0 - 17.0 g/dL   HCT 31.5 40.0 - 86.7 %   MCV 81.2 80.0 - 100.0 fL   MCH 25.4 (L) 26.0 - 34.0 pg   MCHC 31.3 30.0 - 36.0 g/dL   RDW 61.9 50.9 - 32.6 %   Platelets 262 150 - 400 K/uL   nRBC 0.0 0.0 - 0.2 %    Comment: Performed at [facility], [address] [place], [place] [address]  Rapid urine drug screen (hospital performed)     Status: Abnormal   Collection Time: 07/29/18  8:31 AM  Result Value Ref Range   Opiates NONE DETECTED NONE DETECTED   Cocaine NONE DETECTED NONE DETECTED   Benzodiazepines NONE DETECTED NONE  DETECTED   Amphetamines POSITIVE (A) NONE DETECTED   Tetrahydrocannabinol NONE DETECTED NONE DETECTED   Barbiturates NONE DETECTED NONE DETECTED    Comment: (NOTE) DRUG SCREEN FOR MEDICAL PURPOSES ONLY.  IF CONFIRMATION IS NEEDED FOR ANY PURPOSE, NOTIFY LAB WITHIN 5 DAYS. LOWEST DETECTABLE LIMITS FOR URINE DRUG SCREEN Drug Class                     Cutoff (ng/mL)  Amphetamine and metabolites    1000 Barbiturate and metabolites    200 Benzodiazepine                 200 Tricyclics and metabolites     300 Opiates and metabolites        300 Cocaine and metabolites        300 THC                            50 Performed at [facility], [address] [place], [place] [id number]     Blood Alcohol level:  Lab Results  Component Value Date   [facility] 07/29/2018   ETH <10 07/16/2018    Metabolic Disorder Labs:  Lab Results  Component Value Date   HGBA1C 5.7 (H) 12/19/2017   MPG 116.89 12/19/2017   MPG 128 04/02/2015   Lab Results  Component Value Date   PROLACTIN 45.8 (H) 12/19/2017   PROLACTIN 7.0 04/04/2015   Lab Results  Component Value Date   CHOL 182 12/19/2017   TRIG 204 (H) 12/19/2017   HDL 30 (L) 12/19/2017   CHOLHDL 6.1 12/19/2017   VLDL 41 (H) 12/19/2017   LDLCALC 111 (H) 12/19/2017   LDLCALC 96 04/04/2015    Current Medications: Current Facility-Administered Medications  Medication Dose Route Frequency Provider Last Rate Last Dose  . acetaminophen (TYLENOL) tablet 650 mg  650 mg Oral Q6H PRN Malvin JohnsFarah, Daelin Haste, MD      . alum & mag hydroxide-simeth (MAALOX/MYLANTA) 200-200-20 MG/5ML suspension 30 mL  30 mL Oral Q4H PRN Malvin JohnsFarah, Asyria Kolander, MD      . benztropine (COGENTIN) tablet 0.5 mg  0.5 mg Oral BID Malvin JohnsFarah, Marilynne Dupuis, MD      . clonazePAM Scarlette Calico(KLONOPIN) tablet 2 mg  2 mg Oral Once Malvin JohnsFarah, Jonavin Seder, MD      . hydrOXYzine (ATARAX/VISTARIL) tablet 50 mg  50 mg Oral TID PRN Malvin JohnsFarah, Finlay Mills, MD      . magnesium hydroxide (MILK OF MAGNESIA) suspension 30 mL  30 mL  Oral Daily PRN Malvin JohnsFarah, Koree Schopf, MD      . risperiDONE (RISPERDAL) tablet 3 mg  3 mg Oral BID Malvin JohnsFarah, Denorris Reust, MD      . traZODone (DESYREL) tablet 300 mg  300 mg Oral QHS Malvin JohnsFarah, Mashell Sieben, MD       PTA Medications: Medications Prior to Admission  Medication Sig Dispense Refill Last Dose  . amantadine (SYMMETREL) 100 MG capsule Take 1 capsule (100 mg total) by mouth 2 (two) times daily. For prevention of drug induced tremors 60 capsule 0 07/28/2018 at Unknown time  . gabapentin (NEURONTIN) 300 MG capsule Take 1 capsule (300 mg total) by mouth 3 (three) times daily. For agitation 90 capsule 0 07/28/2018 at Unknown time  . hydrOXYzine (ATARAX/VISTARIL) 25 MG tablet Take 1 tablet (25 mg total) by mouth every 6 (six) hours as needed for anxiety. 60 tablet 0 07/28/2018 at Unknown time  . nicotine (NICODERM CQ - DOSED IN MG/24 HOURS) 21 mg/24hr patch Place 1 patch (21 mg total) onto the skin daily. (May buy from over the counter): For smoking cessation (Patient not taking: Reported on 07/29/2018) 28 patch 0 Not Taking at Unknown time  . paliperidone (INVEGA SUSTENNA) 234 MG/1.5ML SUSY injection Inject 156 mg into the muscle once for 1 dose. (Due on 07-29-18): For mood control (Patient not taking: Reported on 07/29/2018) 1 mL 0 Not Taking at Unknown time  . paliperidone (INVEGA)  6 MG 24 hr tablet Take 1 tablet (6 mg total) by mouth daily. For mood control 15 tablet 0 07/28/2018 at Unknown time  . [START ON 08/22/2018] Paliperidone ER (INVEGA SUSTENNA) injection Inject 117 mg into the muscle every 28 (twenty-eight) days. (Due on 08-22-18): For mood control (Patient not taking: Reported on 07/29/2018) 0.9 mg 0 Not Taking at Unknown time  . traZODone (DESYREL) 50 MG tablet Take 1 tablet (50 mg total) by mouth at bedtime as needed for sleep. 30 tablet 0 07/28/2018 at Unknown time    Musculoskeletal: Strength & Muscle Tone: within normal limits Gait & Station: normal Patient leans: N/A  Psychiatric Specialty Exam: Physical  Exam  ROS  Blood pressure 117/81, pulse 96.There is no height or weight on file to calculate BMI.  General Appearance: Casual  Eye Contact:  Minimal  Speech:  Pressured  Volume:  Increased  Mood:  Angry and Irritable  Affect:  Congruent  Thought Process:  Disorganized  Orientation:  Full (Time, Place, and Person)  Thought Content:  Illogical, Delusions and Hallucinations: Visual  Suicidal Thoughts:  No  Homicidal Thoughts:  No  Memory:  Immediate;   Fair  Judgement:  Impaired  Insight:  Lacking  Psychomotor Activity:  Increased  Concentration:  Concentration: Fair  Recall:  [employer]  Fund of Knowledge:  Fair  Language:  Fair  Akathisia:  Negative  Handed:  Right  AIMS (if indicated):     Assets:  Leisure Time Physical Health Resilience  ADL's:  Intact  Cognition:  WNL  Sleep:       Treatment Plan Summary: Daily contact with patient to assess and evaluate symptoms and progress in treatment, Medication management and Plan Add antipsychotic therapy add one-time dose of clonazepam for agitation psychosis continue to monitor for withdrawal  Observation Level/Precautions:  15 minute checks  Laboratory:  UDS  Psychotherapy: Reality based rehab based  Medications: Antipsychotic/possible detox meds  Consultations: Not applicable  Discharge Concerns: Long-term sobriety  Estimated LOS: 14 days  Other: Clearly needs longer term meaningful rehab   Physician Treatment Plan for Primary Diagnosis: <principal problem not specified> Long Term Goal(s): Improvement in symptoms so as ready for discharge  Short Term Goals: Ability to disclose and discuss suicidal ideas  Physician Treatment Plan for Secondary Diagnosis: Active Problems:   Methamphetamine-induced psychotic disorder (HCC)  Long Term Goal(s): Improvement in symptoms so as ready for discharge  Short Term Goals: Ability to maintain clinical measurements within normal limits will improve and Compliance with prescribed  medications will improve  I certify that inpatient services furnished can reasonably be expected to improve the patient's condition.    Malvin JohnsFARAH,Adrik Khim, MD 1/24/202011:23 AM

## 2018-07-29 NOTE — Progress Notes (Signed)
Patient arrived to room 500-1 of [facility] adult unit from [facility] after reports of suicidal thoughts and homicidal threats toward his family. Patient endorses that he threatened to kill his family, then slit his throat with the intent to kill himself. Upon assessment patient is preoocupied with missing his daughter, sharing that she is having a birthday party tomorrow which he fears missing. Patient states: "I started smoking cigarettes and drugs again, I got mad at my wife yesterday and broke some glass, and they said I wanted to kill my self". Patient becomes tearful during admission assessment, and requires much encouragement to remain on topic with questions asked. Patient denies any SI at this time, though endorses VH of a ghost and a devil. Patient denies seeing these hallucinations today. Patient does have prior inpatient history at [facility]. Denies any alcohol use. Endorses Meth, crack cocaine, and marijuana use. UDS positive for methamphetamines. Patient declines flu and pneumonia vaccine at this time. Plan of care reviewed with patient and patient verbalizes understanding. Patient, patient clothing, and belongings searched with no contraband found.  Skin assessed with MHT. Skin unremarkable and clear of any abnormal marks. Plan of care and unit policies explained. Understanding verbalized. Consents obtained. No additional questions or concerns at this time. Linens provided. Patient is currently safe and in room at this time. Will continue to monitor.

## 2018-07-29 NOTE — [facility] Group Notes (Signed)
Date: 07/29/18, 1400  Type of Therapy and Topic: Chaplain group, "Hope is.." Chaplain engaged group in discussion about hope and what it looks like in each patients life and current situation.  Participation level:Active  Modes of Intervention: Discussion, Education and Socialization  Summary of Progress/Problems:Pt spoke at length regarding what hope is.  Client was engaged and active but did not appear able to recognize how much he was talking.     Lorri Frederick, LCSW   [facility] LCSW Group Therapy Note

## 2018-07-29 NOTE — Progress Notes (Signed)
07/29/2018 0842  Urine sent to main lab.

## 2018-07-29 NOTE — Progress Notes (Signed)
Per Donell SievertSpencer Darey, PA pt meets criteria for inpt treatment. [facility] at capacity for 500 hall beds. TTS to seek placement. EDP Cardama, Amadeo GarnetPedro Eduardo, MD and pt's nurse Joanie CoddingtonLatricia, RN have been advised.   Princess BruinsAquicha Tayon Parekh, MSW, LCSW Therapeutic Triage Specialist  [phone]

## 2018-07-29 NOTE — Progress Notes (Signed)
Recreation Therapy Notes  Date: 1.24.20 Time: 1000 Location: 500 Hall Dayroom  Group Topic: Communication, Team Building, Problem Solving  Goal Area(s) Addresses:  Patient will effectively work with peer towards shared goal.  Patient will identify skill used to make activity successful.  Patient will identify how skills used during activity can be used to reach post d/c goals.   Intervention: STEM Activity   Activity:   Straw Bridge.  Patients were broken up into groups.  Each group was given 20 straws and a long piece of masking tape.  Each group is to build a free standing bridge that can hold a small hardback book.  Education: Pharmacist, community, Building control surveyor.   Education Outcome: Acknowledges education/In group clarification offered/Needs additional education.   Clinical Observations/Feedback:  Pt did not attend group.     Caroll Rancher, LRT/CTRS         Caroll Rancher A 07/29/2018 11:53 AM

## 2018-07-30 MED ORDER — NICOTINE POLACRILEX 2 MG MT GUM: 2.0000 mg | CHEWING_GUM | OROMUCOSAL | Status: DC | PRN

## 2018-07-30 MED ORDER — GUAIFENESIN-DM 100-10 MG/5ML PO SYRP
5.0000 mL | ORAL_SOLUTION | ORAL | Status: DC | PRN
  Administered 2018-07-30 – 2018-08-02 (×7): 5 mL via ORAL
  Filled 2018-07-30 (×7): qty 5

## 2018-07-30 NOTE — Progress Notes (Addendum)
[facility] MD Progress Note  07/30/2018 2:23 PM [name]  MRN:  [id number] Subjective:  [name] [name] is a 38 year old male is well-known to the service was actually last here and discharged on 1/17 but unfortunately relapsed again on methamphetamines, and is suffering from a drug-induced psychosis again this is a pretty similar presentation for him.  Today he stated that he knows he needs to take his prescribed medications and stop using cigarettes, alcohol and meth. He stated he has a 18 year old daughter and an aging father who he needs to take care of. He stated he lives with his wife who works. 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 He is disorganized in many of his thought processes but at times shows some insight. He maintained fair eye contact but blinks rapidly. He is dressed n basketball type shorts, a tank top and wearing his winter coat. He is pleasant upon approach but seems to have trouble sitting still. He was observed in the day room interacting appropriately with other patient's and nursing students. He requested nicotine patches when he is discharged. He later went to one of the other NP's and told her he needed to see the doctor because he needs to leave. He is still exhibiting signs of drug induced psychosis with rambling speech and flight of ideas.   Principal Problem: Methamphetamine-induced psychotic disorder (HCC) Diagnosis: Active Problems:   Methamphetamine-induced psychotic disorder (HCC)  Total Time spent with patient: 30 minutes  Past Psychiatric History: As above   Past Medical History:  Past Medical History:  Diagnosis Date  . Anxiety   . Depression   . Mental disorder   . Schizophrenia (HCC)    History reviewed. No pertinent surgical history. Family History:  Family History  Problem Relation Age of Onset  . Mental illness Neg Hx    Family Psychiatric  History:  Social History:  Social History   Substance and Sexual Activity  Alcohol Use  Not Currently   Comment: haven't drank in months"     Social History   Substance and Sexual Activity  Drug Use Yes  . Types: Marijuana, Methamphetamines   Comment: Last used: 2 weeks ago    Social History   Socioeconomic History  . Marital status: Single    Spouse name: Not on file  . Number of children: Not on file  . Years of education: Not on file  . Highest education level: Not on file  Occupational History  . Not on file  Social Needs  . Financial resource strain: Not on file  . Food insecurity:    Worry: Not on file    Inability: Not on file  . Transportation needs:    Medical: Not on file    Non-medical: Not on file  Tobacco Use  . Smoking status: Current Every Day Smoker    Packs/day: 1.00    Types: Cigarettes  . Smokeless tobacco: Former Engineer, water and Sexual Activity  . Alcohol use: Not Currently    Comment: haven't drank in months"  . Drug use: Yes    Types: Marijuana, Methamphetamines    Comment: Last used: 2 weeks ago  . Sexual activity: Yes    Birth control/protection: None  Lifestyle  . Physical activity:    Days per week: Not on file    Minutes per session: Not on file  . Stress: Not on file  Relationships  . Social connections:    Talks on phone: Not  on file    Gets together: Not on file    Attends religious service: Not on file    Active member of club or organization: Not on file    Attends meetings of clubs or organizations: Not on file    Relationship status: Not on file  Other Topics Concern  . Not on file  Social History Narrative  . Not on file   Additional Social History:                         Sleep: Fair  Appetite:  Fair  Current Medications: Current Facility-Administered Medications  Medication Dose Route Frequency Provider Last Rate Last Dose  . acetaminophen (TYLENOL) tablet 650 mg  650 mg Oral Q6H PRN Malvin JohnsFarah, Brian, MD   650 mg at 07/30/18 1401  . alum & mag hydroxide-simeth (MAALOX/MYLANTA) 200-200-20  MG/5ML suspension 30 mL  30 mL Oral Q4H PRN Malvin JohnsFarah, Brian, MD      . benztropine (COGENTIN) tablet 0.5 mg  0.5 mg Oral BID Malvin JohnsFarah, Brian, MD   0.5 mg at 07/30/18 [id number]  . hydrOXYzine (ATARAX/VISTARIL) tablet 50 mg  50 mg Oral TID PRN Malvin JohnsFarah, Brian, MD      . magnesium hydroxide (MILK OF MAGNESIA) suspension 30 mL  30 mL Oral Daily PRN Malvin JohnsFarah, Brian, MD      . risperiDONE (RISPERDAL) tablet 3 mg  3 mg Oral BID Malvin JohnsFarah, Brian, MD   3 mg at 07/30/18 11910837  . traZODone (DESYREL) tablet 300 mg  300 mg Oral QHS Malvin JohnsFarah, Brian, MD   300 mg at 07/29/18 2049    Lab Results:  Results for orders placed or performed during the hospital encounter of 07/28/18 (from the past 48 hour(s))  Comprehensive metabolic panel     Status: Abnormal   Collection Time: 07/29/18 12:29 AM  Result Value Ref Range   Sodium 138 135 - 145 mmol/L   Potassium 3.6 3.5 - 5.1 mmol/L   Chloride 105 98 - 111 mmol/L   CO2 24 22 - 32 mmol/L   Glucose, Bld 97 70 - 99 mg/dL   BUN 15 6 - 20 mg/dL   Creatinine, Ser 4.780.97 0.61 - 1.24 mg/dL   Calcium 9.5 8.9 - 29.510.3 mg/dL   Total Protein 7.5 6.5 - 8.1 g/dL   Albumin 4.4 3.5 - 5.0 g/dL   AST 23 15 - 41 U/L   ALT 56 (H) 0 - 44 U/L   Alkaline Phosphatase 57 38 - 126 U/L   Total Bilirubin 0.6 0.3 - 1.2 mg/dL   GFR calc non Af Amer >60 >60 mL/min   GFR calc Af Amer >60 >60 mL/min   Anion gap 9 5 - 15    Comment: Performed at [facility], [address] [place], [place] [address]  Ethanol     Status: None   Collection Time: 07/29/18 12:29 AM  Result Value Ref Range   Alcohol, Ethyl (B) <10 <10 mg/dL    Comment: (NOTE) Lowest detectable limit for serum alcohol is 10 mg/dL. For medical purposes only. Performed at [facility], [address] [place], [place] [address]   cbc     Status: Abnormal   Collection Time: 07/29/18 12:29 AM  Result Value Ref Range   WBC 6.1 4.0 - 10.5 K/uL   RBC 5.36 4.22 - 5.81 MIL/uL   Hemoglobin 13.6 13.0 - 17.0 g/dL    HCT 46.943.5 62.939.0 - 52.852.0 %  MCV 81.2 80.0 - 100.0 fL   MCH 25.4 (L) 26.0 - 34.0 pg   MCHC 31.3 30.0 - 36.0 g/dL   RDW 81.0 17.5 - 10.2 %   Platelets 262 150 - 400 K/uL   nRBC 0.0 0.0 - 0.2 %    Comment: Performed at [facility], [address], [place], [place] [address]  Rapid urine drug screen (hospital performed)     Status: Abnormal   Collection Time: 07/29/18  8:31 AM  Result Value Ref Range   Opiates NONE DETECTED NONE DETECTED   Cocaine NONE DETECTED NONE DETECTED   Benzodiazepines NONE DETECTED NONE DETECTED   Amphetamines POSITIVE (A) NONE DETECTED   Tetrahydrocannabinol NONE DETECTED NONE DETECTED   Barbiturates NONE DETECTED NONE DETECTED    Comment: (NOTE) DRUG SCREEN FOR MEDICAL PURPOSES ONLY.  IF CONFIRMATION IS NEEDED FOR ANY PURPOSE, NOTIFY LAB WITHIN 5 DAYS. LOWEST DETECTABLE LIMITS FOR URINE DRUG SCREEN Drug Class                     Cutoff (ng/mL) Amphetamine and metabolites    1000 Barbiturate and metabolites    200 Benzodiazepine                 200 Tricyclics and metabolites     300 Opiates and metabolites        300 Cocaine and metabolites        300 THC                            50 Performed at [facility], [address], [place], [place] [address]     Blood Alcohol level:  Lab Results  Component Value Date   [facility] <10 07/29/2018   ETH <10 07/16/2018    Metabolic Disorder Labs: Lab Results  Component Value Date   HGBA1C 5.7 (H) 12/19/2017   MPG 116.89 12/19/2017   MPG 128 04/02/2015   Lab Results  Component Value Date   PROLACTIN 45.8 (H) 12/19/2017   PROLACTIN 7.0 04/04/2015   Lab Results  Component Value Date   CHOL 182 12/19/2017   TRIG 204 (H) 12/19/2017   HDL 30 (L) 12/19/2017   CHOLHDL 6.1 12/19/2017   VLDL 41 (H) 12/19/2017   LDLCALC 111 (H) 12/19/2017   LDLCALC 96 04/04/2015    Physical Findings: AIMS: Facial and Oral Movements Muscles of Facial Expression: None, normal Lips and  Perioral Area: None, normal Jaw: None, normal Tongue: None, normal,Extremity Movements Upper (arms, wrists, hands, fingers): None, normal Lower (legs, knees, ankles, toes): None, normal, Trunk Movements Neck, shoulders, hips: None, normal, Overall Severity Severity of abnormal movements (highest score from questions above): None, normal Incapacitation due to abnormal movements: None, normal Patient's awareness of abnormal movements (rate only patient's report): No Awareness, Dental Status Current problems with teeth and/or dentures?: No Does patient usually wear dentures?: No  CIWA:    COWS:  COWS Total Score: 0  Musculoskeletal: Strength & Muscle Tone: within normal limits Gait & Station: normal Patient leans: N/A   Psychiatric Specialty Exam: Physical Exam  ROS  Blood pressure 117/81, pulse 96.There is no height or weight on file to calculate BMI.  General Appearance: Casual  Eye Contact:  Minimal  Speech:  Pressured  Volume:  Increased  Mood:  Angry and Irritable  Affect:  Congruent  Thought Process:  Disorganized  Orientation:  Full (Time, Place, and Person)  Thought  Content:  Illogical, Delusions and Hallucinations: Visual  Suicidal Thoughts:  No  Homicidal Thoughts:  No  Memory:  Immediate;   Fair  Judgement:  Impaired  Insight:  Lacking  Psychomotor Activity:  Increased  Concentration:  Concentration: Fair  Recall:  [employer] of Knowledge:  Fair  Language:  Fair  Akathisia:  Negative  Handed:  Right  AIMS (if indicated):     Assets:  Leisure Time Physical Health Resilience  ADL's:  Intact  Cognition:  WNL  Sleep:          Treatment Plan Summary: Daily contact with patient to assess and evaluate symptoms and progress in treatment and Medication management   Attend group therapy  Participate in therapeutic milieu Medication management: -Continue Cogentin 0.5 mg Bid -Risperdal 3 mg BID  -Trazodone 300 mg at bedtime   Laveda Abbe,  NP 07/30/2018, 2:23 PM   Agree with NP progress note

## 2018-07-30 NOTE — Progress Notes (Signed)
Pt was in bed in his room upon initial approach.  He denies SI/HI, hallucinations, and pain.  His goal is to "get some sleep."  Pt forwards little information.  He is seen in dayroom with few peer interactions.  Pt reports the best part of his day was "group."    Introduced self to pt.  Actively listened to pt and offered support and encouragement.  Medication administered per order.  Q15 minute safety checks maintained.  Pt is compliant with medications and he verbally contracts for safety.  Will continue to monitor and assess.

## 2018-07-30 NOTE — [facility] Group Notes (Signed)
LCSW Group Therapy Note  07/30/2018   10:00-11:00am   Type of Therapy and Topic:  Group Therapy: Anger Cues and Responses  Participation Level:  Active   Description of Group:   In this group, patients learned how to recognize the physical, cognitive, emotional, and behavioral responses they have to anger-provoking situations.  They identified a recent time they became angry and how they reacted.  They analyzed how their reaction was possibly beneficial and how it was possibly unhelpful.  The group discussed a variety of healthier coping skills that could help with such a situation in the future.  Deep breathing was practiced briefly.  Therapeutic Goals: 1. Patients will remember their last incident of anger and how they felt emotionally and physically, what their thoughts were at the time, and how they behaved. 2. Patients will identify how their behavior at that time worked for them, as well as how it worked against them. 3. Patients will explore possible new behaviors to use in future anger situations. 4. Patients will learn that anger itself is normal and cannot be eliminated, and that healthier reactions can assist with resolving conflict rather than worsening situations.  Summary of Patient Progress:  The patient shared that he was angry at the death of his daughter. He reports that learned to cope with anger and with his drug use when he was in the [facility] program. He is interested in returning something similar after he is discharged. The patient had difficulty in staying topic and his replies  were long and wide ranging. With redirection he was able refocus  Briefly before going becoming tangential.  Therapeutic Modalities:   Cognitive Behavioral Therapy  Evorn Gong

## 2018-07-30 NOTE — Plan of Care (Signed)
  Problem: Activity: Goal: Sleeping patterns will improve Outcome: Progressing   Problem: Safety: Goal: Periods of time without injury will increase Outcome: Progressing   

## 2018-07-30 NOTE — Plan of Care (Signed)
Progress note  D: pt found in bed; compliant with medication administration. Pt has complaints of generalized pain and aching and a head cold that he rates at a 5-6/10. Pt was working out in his room and stated that his symptoms cleared up after doing this. Pt is animated and interacting with others in the dayroom today. Pt denies si/hi/ah/vh and verbally agrees to approach staff if these become apparent or before harming himself or others while at [facility]. Pt continues to struggle with his drug addiction and how it plays into his family dynamic.  A: pt provided support and encouragement. Pt given medication per protocol and standing orders. Q18m safety checks implemented and continued. R: pt safe on the unit. Will continue to monitor.   Pt progressing in the following metrics   Problem: Education: Goal: Knowledge of [place] [facility] Education information/materials will improve Outcome: Progressing Goal: Emotional status will improve Outcome: Progressing Goal: Mental status will improve Outcome: Progressing Goal: Verbalization of understanding the information provided will improve Outcome: Progressing

## 2018-07-30 NOTE — Progress Notes (Signed)
D: Pt denies SI/HI Patient is having some auditory hallucinations. Patient has been in room most of shift.  A: Pt was offered support and encouragement. Pt was given scheduled medications. Pt was encourage to attend groups. Q 15 minute checks were done for safety.  R:Pt has no complaints.Pt receptive to treatment and safety maintained on unit.

## 2018-07-31 MED ORDER — RISPERIDONE 3 MG PO TABS
3.0000 mg | ORAL_TABLET | Freq: Every day | ORAL | Status: DC
  Administered 2018-07-31 – 2018-08-04 (×5): 3 mg via ORAL
  Filled 2018-07-31 (×7): qty 1

## 2018-07-31 MED ORDER — RISPERIDONE 2 MG PO TABS
4.0000 mg | ORAL_TABLET | Freq: Every day | ORAL | Status: DC
  Administered 2018-07-31 – 2018-08-03 (×4): 4 mg via ORAL
  Filled 2018-07-31 (×6): qty 2

## 2018-07-31 NOTE — Progress Notes (Signed)
Patient ID: [name] [name] Heeren, male   DOB: 1981/04/29, 38 y.o.   MRN: [id number]    D: Pt has been very isolative today on the unit, pt reported that he just did not feel good. Pt reported having a cold, medication was provided. 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 Pt continues to be delusional on the unit and has no insight. Pt reported that his depression was a 0, his hopelessness was a 2, and his anxiety was a 0. Pt reported that his goal for today was to get released tomorrow. Pt reported being negative SI/HI, no AH/VH noted. A: 15 min checks continued for patient safety. R: Pt safety maintained.

## 2018-07-31 NOTE — Progress Notes (Addendum)
[facility] MD Progress Note  07/31/2018 11:11 AM [name] [name]  MRN:  [id number]  [name] [name] seen sitting in day room interacting with peers.  He is awake alert and oriented to person and place.  Patient continues to request to be discharged as soon as possible.  Continues to present with disorganized and psychosis throughout this assessment.  Chart reviewed.  Some improvement noted with behavior and thought process. "  I know I have to do better, my wife may go back to [place] with my son if I keep this up."  Reports his wife is supportive.  Patient reports he is hopeful for family visit.  As his father has been the only 1 to come visit during visitation hours.  He denies suicidal or homicidal ideations.  Reports auditory hallucinations.  Reports taking medications as prescribed and tolerating well.  Continues to be intrusive, disorganized and animated with peers and staff.  Support encouragement reassurance was provided.   History: [name] [name] is a 38 year old male is well-known to the service was actually last here and discharged on 1/17 but unfortunately relapsed again on methamphetamines, and is suffering from a drug-induced psychosis again this is a pretty similar presentation for him.   Principal Problem: Methamphetamine-induced psychotic disorder (HCC) Diagnosis: Principal Problem:   Methamphetamine-induced psychotic disorder (HCC)  Total Time spent with patient: 30 minutes  Past Psychiatric History: As above   Past Medical History:  Past Medical History:  Diagnosis Date  . Anxiety   . Depression   . Mental disorder   . Schizophrenia (HCC)    History reviewed. No pertinent surgical history. Family History:  Family History  Problem Relation Age of Onset  . Mental illness Neg Hx    Family Psychiatric  History:  Social History:  Social History   Substance and Sexual Activity  Alcohol Use Not Currently   Comment: haven't drank in months"     Social History   Substance and Sexual  Activity  Drug Use Yes  . Types: Marijuana, Methamphetamines   Comment: Last used: 2 weeks ago    Social History   Socioeconomic History  . Marital status: Single    Spouse name: Not on file  . Number of children: Not on file  . Years of education: Not on file  . Highest education level: Not on file  Occupational History  . Not on file  Social Needs  . Financial resource strain: Not on file  . Food insecurity:    Worry: Not on file    Inability: Not on file  . Transportation needs:    Medical: Not on file    Non-medical: Not on file  Tobacco Use  . Smoking status: Current Every Day Smoker    Packs/day: 1.00    Types: Cigarettes  . Smokeless tobacco: Former Engineer, water and Sexual Activity  . Alcohol use: Not Currently    Comment: haven't drank in months"  . Drug use: Yes    Types: Marijuana, Methamphetamines    Comment: Last used: 2 weeks ago  . Sexual activity: Yes    Birth control/protection: None  Lifestyle  . Physical activity:    Days per week: Not on file    Minutes per session: Not on file  . Stress: Not on file  Relationships  . Social connections:    Talks on phone: Not on file    Gets together: Not on file    Attends religious service: Not on file    Active member of club  or organization: Not on file    Attends meetings of clubs or organizations: Not on file    Relationship status: Not on file  Other Topics Concern  . Not on file  Social History Narrative  . Not on file   Additional Social History:                         Sleep: Fair  Appetite:  Fair  Current Medications: Current Facility-Administered Medications  Medication Dose Route Frequency Provider Last Rate Last Dose  . acetaminophen (TYLENOL) tablet 650 mg  650 mg Oral Q6H PRN Malvin JohnsFarah, Brian, MD   650 mg at 07/30/18 1401  . alum & mag hydroxide-simeth (MAALOX/MYLANTA) 200-200-20 MG/5ML suspension 30 mL  30 mL Oral Q4H PRN Malvin JohnsFarah, Brian, MD      . benztropine (COGENTIN)  tablet 0.5 mg  0.5 mg Oral BID Malvin JohnsFarah, Brian, MD   0.5 mg at 07/31/18 0802  . guaiFENesin-dextromethorphan (ROBITUSSIN DM) 100-10 MG/5ML syrup 5 mL  5 mL Oral Q4H PRN Oneta RackLewis, Tanika N, NP   5 mL at 07/31/18 0803  . hydrOXYzine (ATARAX/VISTARIL) tablet 50 mg  50 mg Oral TID PRN Malvin JohnsFarah, Brian, MD      . magnesium hydroxide (MILK OF MAGNESIA) suspension 30 mL  30 mL Oral Daily PRN Malvin JohnsFarah, Brian, MD      . nicotine polacrilex (NICORETTE) gum 2 mg  2 mg Oral PRN Oneta RackLewis, Tanika N, NP      . risperiDONE (RISPERDAL) tablet 3 mg  3 mg Oral BID Malvin JohnsFarah, Brian, MD   3 mg at 07/31/18 0802  . traZODone (DESYREL) tablet 300 mg  300 mg Oral QHS Malvin JohnsFarah, Brian, MD   300 mg at 07/30/18 2147    Lab Results:  No results found for this or any previous visit (from the past 48 hour(s)).  Blood Alcohol level:  Lab Results  Component Value Date   ETH <10 07/29/2018   ETH <10 07/16/2018    Metabolic Disorder Labs: Lab Results  Component Value Date   HGBA1C 5.7 (H) 12/19/2017   MPG 116.89 12/19/2017   MPG 128 04/02/2015   Lab Results  Component Value Date   PROLACTIN 45.8 (H) 12/19/2017   PROLACTIN 7.0 04/04/2015   Lab Results  Component Value Date   CHOL 182 12/19/2017   TRIG 204 (H) 12/19/2017   HDL 30 (L) 12/19/2017   CHOLHDL 6.1 12/19/2017   VLDL 41 (H) 12/19/2017   LDLCALC 111 (H) 12/19/2017   LDLCALC 96 04/04/2015    Physical Findings: AIMS: Facial and Oral Movements Muscles of Facial Expression: None, normal Lips and Perioral Area: None, normal Jaw: None, normal Tongue: None, normal,Extremity Movements Upper (arms, wrists, hands, fingers): None, normal Lower (legs, knees, ankles, toes): None, normal, Trunk Movements Neck, shoulders, hips: None, normal, Overall Severity Severity of abnormal movements (highest score from questions above): None, normal Incapacitation due to abnormal movements: None, normal Patient's awareness of abnormal movements (rate only patient's report): No Awareness,  Dental Status Current problems with teeth and/or dentures?: No Does patient usually wear dentures?: No  CIWA:    COWS:  COWS Total Score: 0  Musculoskeletal: Strength & Muscle Tone: within normal limits Gait & Station: normal Patient leans: N/A   Psychiatric Specialty Exam: Physical Exam  ROS  Blood pressure 117/81, pulse 96.There is no height or weight on file to calculate BMI.  General Appearance: Casual  Eye Contact:  Minimal  Speech:  Pressured  Volume:  Increased  Mood:  Angry and Irritable  Affect:  Congruent  Thought Process:  Disorganized  Orientation:  Full (Time, Place, and Person)  Thought Content:  Illogical, Delusions and Hallucinations: Visual  Suicidal Thoughts:  No  Homicidal Thoughts:  No  Memory:  Immediate;   Fair  Judgement:  Impaired  Insight:  Lacking  Psychomotor Activity:  Increased  Concentration:  Concentration: Fair  Recall:  [employer]  Fund of Knowledge:  Fair  Language:  Fair  Akathisia:  Negative  Handed:  Right  AIMS (if indicated):     Assets:  Leisure Time Physical Health Resilience  ADL's:  Intact  Cognition:  WNL  Sleep:       Treatment Plan Summary: Daily contact with patient to assess and evaluate symptoms and progress in treatment and Medication management   Continue with current treatment plan on 07/31/2018 as listed below except were noted   Medication management:  -Continue Cogentin 0.5 mg p.o. twice daily  -Adjusted Risperdal 3 mg p.o. twice daily to 3 mg daily and 4 mg nightly  -Continue trazodone 300 mg at bedtime   CSW to continue working on discharge disposition  Attend group therapy and Participate in therapeutic milieu   Oneta Rackanika N Lewis, NP 07/31/2018, 11:11 AM Agree with NP progress note

## 2018-07-31 NOTE — Progress Notes (Signed)
Patient ID: [name] [name], male   DOB: 12-18-1980, 38 y.o.   MRN: [id number] D: Pt was isolative to his room at start of shift, but was given positive reinforcements to walk to the medication window for his night meds and complied.  Pt denies SI/HI/AVH.  A: Pt took all of his meds as scheduled, and is being maintained on Q15 minute checks for safety.  R: Will continue to monitor.

## 2018-07-31 NOTE — [facility] Group Notes (Signed)
[facility] Group Notes:  (Nursing/MHT/Case Management/Adjunct)  Date:  07/31/2018  Time:  11:07 AM  Type of Therapy:  Psychoeducational Skills  Participation Level:  Did Not Attend  Participation Quality:  Did not attend  Affect:  Did not attend  Cognitive:  Did not attend  Insight:  None  Engagement in Group:  Did not attend  Modes of Intervention:  Did not attend  Summary of Progress/Problems: Pt did not attend Psychoeducational group with topic healthy support systems.   Jacquelyne Balint Shanta 07/31/2018, 11:07 AM

## 2018-07-31 NOTE — [facility] Group Notes (Signed)
[facility] LCSW Group Therapy Note  Date/Time:  07/31/2018 9:00-10:00 or 10:00-11:00AM  Type of Therapy and Topic:  Group Therapy:  Healthy and Unhealthy Supports  Participation Level:  Active   Description of Group:  Patients in this group were introduced to the idea of adding a variety of healthy supports to address the various needs in their lives.Patients discussed what additional healthy supports could be helpful in their recovery and wellness after discharge in order to prevent future hospitalizations.   An emphasis was placed on using counselor, doctor, therapy groups, 12-step groups, and problem-specific support groups to expand supports.  They also worked as a group on developing a specific plan for several patients to deal with unhealthy supports through boundary-setting, psychoeducation with loved ones, and even termination of relationships.   Therapeutic Goals:   1)  discuss importance of adding supports to stay well once out of the hospital  2)  compare healthy versus unhealthy supports and identify some examples of each  3)  generate ideas and descriptions of healthy supports that can be added  4)  offer mutual support about how to address unhealthy supports  5)  encourage active participation in and adherence to discharge plan    Summary of Patient Progress:  The patient stated that current healthy supports in his life are family while current unhealthy supports include drug using associates.  The patient expressed a willingness to add the [employer] program and church as supports to help in his recovery journey. Patient continues to be tangential and often gets lost when he is trying answer or respond.   Therapeutic Modalities:   Motivational Interviewing Brief Solution-Focused Therapy  Evorn Gong

## 2018-08-01 LAB — INFLUENZA PANEL BY PCR (TYPE A & B)
INFLBPCR: NEGATIVE
Influenza A By PCR: NEGATIVE

## 2018-08-01 MED ORDER — BENZTROPINE MESYLATE 0.5 MG PO TABS
0.5000 mg | ORAL_TABLET | Freq: Two times a day (BID) | ORAL | Status: DC
  Administered 2018-08-01 – 2018-08-04 (×6): 0.5 mg via ORAL
  Filled 2018-08-01 (×12): qty 1

## 2018-08-01 MED ORDER — NICOTINE 14 MG/24HR TD PT24
14.0000 mg | MEDICATED_PATCH | Freq: Every day | TRANSDERMAL | Status: DC
  Administered 2018-08-01 – 2018-08-04 (×4): 14 mg via TRANSDERMAL
  Filled 2018-08-01 (×7): qty 1

## 2018-08-01 NOTE — Progress Notes (Signed)
Recreation Therapy Notes  Patient admitted to unit 1.23.20. Due to admission within last year, no new assessment conducted at this time. Last assessment conducted 1.14.20. Patient reports his stressors for this admission were bad thoughts and getting bad advice from people.  Patient stated his reason for admission were substance abuse, lack of sleep and hearing voices.     Patient denies SI, HI, AVH at this time. Patient stated he wants to improve in the area of recovery from drugs and go to church more.  Patient reports goal of getting as much help to get off cigarettes, focus on daughter and get healthy.  Information found below from assessment conducted 1.14.20:   Coping Skills:  Journal, Sports, TV, Music, Exercise, Meditate, Deep Breathing, Substance Use, Talk, Art, Prayer, Avoidance, Read, Hot Bath/Shower  Leisure Interests:  Shopping, Writing, Be with daughter  Community Resources:  Park, Engineering geologist  Strengths:  Love family; Make friends; Helping people  Areas of Improvement:  Anger; Alcohol/Drugs; Coping with Mental Illness    Adelisa Satterwhite Lillia Abed, LRT/CTRS    Caroll Rancher A 08/01/2018 1:22 PM

## 2018-08-01 NOTE — Progress Notes (Signed)
Recreation Therapy Notes  Date: 1.27.20 Time: 1000 Location: 500 Hall Dayroom  Group Topic: Wellness  Goal Area(s) Addresses:  Patient will define components of whole wellness. Patient will verbalize benefit of whole wellness.  Behavioral Response: Engaged  Intervention:   Music, 2 Decks of Playing Cards  Activity: Deck of Chance.  LRT gave each patient 3 cards from one deck of cards.  LRT would pull a card from the second deck of cards.  If LRT pulled the card of a patient, the patient would have to complete the exercise associated with that card.  Education: Wellness, Building control surveyor.   Education Outcome: Acknowledges education/In group clarification offered/Needs additional education.   Clinical Observations/Feedback: Pt was bright and engaged during activity.  Pt completed the exercises as well as exercises that weren't assigned to him.  Pt was social with peers and seemed to be enjoying the activity.    Caroll Rancher, LRT/CTRS     Caroll Rancher A 08/01/2018 12:28 PM

## 2018-08-01 NOTE — Progress Notes (Signed)
D:  [name] [name] was up and visible on the unit.  He was noted sitting in the day room interacting well with staff and peers.  He reported having a good visit with his father this evening.  He was showing pictures of his wife and child to staff and peers.  He was tangential with his conversation but pleasant.  He denied SI/HI or A/V hallucinations.  He stated he has been having difficulty sleeping and took his hs medications without difficulty.  He is currently awake in his room coughing, cough syrup given prn.   A:  1:1 with RN for support and encouragement.  Medications as ordered.  Q 15 minute checks maintained for safety.  Encouraged participation in group and unit activities.   R:  [name] [name] remains safe on the unit.  We will continue to monitor the progress towards his goals.

## 2018-08-01 NOTE — Progress Notes (Addendum)
Patient ID: [name] [name], male   DOB: Dec 11, 1980, 38 y.o.   MRN: [id number]  Pt currently presents with an anxious affect and behavior. Pt seen pacing in the hallway this morning. Pt reports to Clinical research associate that he wishes to be discharged to an affordable apartment. Reports that he will not be able to maintain sobriety in current living situation. Asks for medication early and often. Pt reports good sleep with current medication regimen.   Pt provided with medications per providers orders. Pt's labs and vitals were monitored throughout the night. Pt supported emotionally and encouraged to express concerns and questions. Pt educated on medications and fall risk precautions.   Pt's safety ensured with 15 minute and environmental checks. Pt currently denies SI/HI and A/V hallucinations. Pt verbally agrees to seek staff if SI/HI or A/VH occurs and to consult with staff before acting on any harmful thoughts. Will continue POC.

## 2018-08-01 NOTE — Tx Team (Signed)
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 acetaminophen (TYLENOL) tablet 650 mg  650 mg Oral Q6H PRN Malvin Johns, MD   650 mg at 07/31/18 1256  . alum & mag hydroxide-simeth (MAALOX/MYLANTA) 200-200-20 MG/5ML suspension 30 mL  30 mL Oral Q4H PRN Malvin Johns, MD      . benztropine (COGENTIN) tablet 0.5 mg  0.5 mg Oral BID Malvin Johns, MD   0.5 mg at 08/01/18 0801  . guaiFENesin-dextromethorphan (ROBITUSSIN DM) 100-10 MG/5ML syrup 5 mL  5 mL Oral Q4H PRN Oneta Rack, NP   5 mL at 07/31/18 1256  . hydrOXYzine (ATARAX/VISTARIL) tablet 50 mg  50 mg Oral TID PRN Malvin Johns, MD   50 mg at 07/31/18 1256  . magnesium hydroxide (MILK OF MAGNESIA) suspension 30 mL  30 mL Oral Daily PRN Malvin Johns, MD      . nicotine polacrilex (NICORETTE) gum 2 mg  2 mg Oral PRN Oneta Rack, NP      . risperiDONE (RISPERDAL) tablet 3 mg  3 mg Oral Daily Oneta Rack, NP   3 mg at 08/01/18 0802  . risperiDONE (RISPERDAL) tablet 4 mg  4 mg Oral QHS Oneta Rack, NP   4 mg at 07/31/18 2134  . traZODone (DESYREL) tablet 300 mg  300 mg Oral QHS Malvin Johns, MD   300 mg at 07/31/18 2134   PTA Medications: Medications Prior to Admission  Medication Sig Dispense Refill Last Dose  . amantadine (SYMMETREL) 100 MG capsule Take 1 capsule (100 mg total) by mouth 2 (two) times daily. For prevention of drug induced tremors 60 capsule 0 07/28/2018 at Unknown time  . gabapentin (NEURONTIN) 300 MG capsule Take 1 capsule (300 mg total) by mouth 3 (three) times daily. For agitation 90 capsule 0 07/28/2018 at Unknown time  . hydrOXYzine (ATARAX/VISTARIL)  25 MG tablet Take 1 tablet (25 mg total) by mouth every 6 (six) hours as needed for anxiety. 60 tablet 0 07/28/2018 at Unknown time  . nicotine (NICODERM CQ - DOSED IN MG/24 HOURS) 21 mg/24hr patch Place 1 patch (21 mg total) onto the skin daily. (May buy from over the counter): For smoking cessation (Patient not taking: Reported on 07/29/2018) 28 patch 0 Not Taking at Unknown time  . paliperidone (INVEGA SUSTENNA) 234 MG/1.5ML SUSY injection Inject 156 mg into the muscle once for 1 dose. (Due on 07-29-18): For mood control (Patient not taking: Reported on 07/29/2018) 1 mL 0 Not Taking at Unknown time  . paliperidone (INVEGA) 6 MG 24 hr tablet Take 1 tablet (6 mg total) by mouth daily. For mood control 15 tablet 0 07/28/2018 at Unknown time  . [START ON 08/22/2018] Paliperidone ER (INVEGA SUSTENNA) injection Inject 117 mg into the muscle every 28 (twenty-eight) days. (Due on 08-22-18): For mood control (Patient not taking: Reported on 07/29/2018) 0.9 mg 0 Not Taking at Unknown time  . traZODone (DESYREL) 50 MG tablet Take 1 tablet (50 mg total) by mouth at bedtime as needed for sleep. 30 tablet 0 07/28/2018 at Unknown time    Patient Stressors:    Patient Strengths:    Treatment Modalities: Medication Management, Group therapy, Case management,  1 to 1 session with clinician, Psychoeducation,  Recreational therapy.   Physician Treatment Plan for Primary Diagnosis: Methamphetamine-induced psychotic disorder (HCC) Long Term Goal(s): Improvement in symptoms so as ready for discharge Improvement in symptoms so as ready for discharge   Short Term Goals: Ability to disclose and discuss suicidal ideas Ability to maintain clinical measurements within normal limits will improve Compliance with prescribed medications will improve  Medication Management: Evaluate patient's response, side effects, and tolerance of medication regimen.  Therapeutic Interventions: 1 to 1 sessions, Unit Group sessions and  Medication administration.  Evaluation of Outcomes: Progressing  Physician Treatment Plan for Secondary Diagnosis: Principal Problem:   Methamphetamine-induced psychotic disorder (HCC)  Long Term Goal(s): Improvement in symptoms so as ready for discharge Improvement in symptoms so as ready for discharge   Short Term Goals: Ability to disclose and discuss suicidal ideas Ability to maintain clinical measurements within normal limits will improve Compliance with prescribed medications will improve     Medication Management: Evaluate patient's response, side effects, and tolerance of medication regimen.  Therapeutic Interventions: 1 to 1 sessions, Unit Group sessions and Medication administration.  Evaluation of Outcomes: Progressing   RN Treatment Plan for Primary Diagnosis: Methamphetamine-induced psychotic disorder (HCC) Long Term Goal(s): Knowledge of disease and therapeutic regimen to maintain health will improve  Short Term Goals: Ability to identify and develop effective coping behaviors will improve and Compliance with prescribed medications will improve  Medication Management: RN will administer medications as ordered by provider, will assess and evaluate patient's response and provide education to patient for prescribed medication. RN will report any adverse and/or side effects to prescribing provider.  Therapeutic Interventions: 1 on 1 counseling sessions, Psychoeducation, Medication administration, Evaluate responses to treatment, Monitor vital signs and CBGs as ordered, Perform/monitor CIWA, COWS, AIMS and Fall Risk screenings as ordered, Perform wound care treatments as ordered.  Evaluation of Outcomes: Progressing   LCSW Treatment Plan for Primary Diagnosis: Methamphetamine-induced psychotic disorder (HCC) Long Term Goal(s): Safe transition to appropriate next level of care at discharge, Engage patient in therapeutic group addressing interpersonal concerns.  Short Term  Goals: Engage patient in aftercare planning with referrals and resources, Increase social support and Increase skills for wellness and recovery  Therapeutic Interventions: Assess for all discharge needs, 1 to 1 time with Social worker, Explore available resources and support systems, Assess for adequacy in community support network, Educate family and significant other(s) on suicide prevention, Complete Psychosocial Assessment, Interpersonal group therapy.  Evaluation of Outcomes: Progressing   Progress in Treatment: Attending groups: Yes. Participating in groups: Yes. Taking medication as prescribed: Yes. Toleration medication: Yes. Family/Significant other contact made: No, will contact:  sister Patient understands diagnosis: No. Discussing patient identified problems/goals with staff: Yes. Medical problems stabilized or resolved: Yes. Denies suicidal/homicidal ideation: Yes. Issues/concerns per patient self-inventory: No. Other: none  New problem(s) identified: No, Describe:  none  New Short Term/Long Term Goal(s):  Patient Goals:  Attend NA/AA.  Discharge Plan or Barriers:   Reason for Continuation of Hospitalization: Aggression Delusions  Medication stabilization  Estimated Length of Stay: 1-3 days.  Attendees: Patient: [name] [name] 08/01/2018   Physician: Dr. Jeannine Kitten, MD 08/01/2018   Nursing:  08/01/2018   RN Care Manager: 08/01/2018   Social Worker: Daleen Squibb, LCSW 08/01/2018   Recreational Therapist:  08/01/2018   Other:  08/01/2018   Other:  08/01/2018   Other: 08/01/2018     Scribe for Treatment Team: Lorri Frederick, LCSW 08/01/2018 9:21 AM

## 2018-08-01 NOTE — Progress Notes (Signed)
CSW spoke to [place] with [facility].  After last discharge, They attempted to see [name] [name] at his home, he was not there and they could not locate him. Garner Nash, MSW, LCSW Clinical Social Worker 08/01/2018 10:33 AM

## 2018-08-01 NOTE — Progress Notes (Addendum)
CSW made CPS report to [facility] [place] DSS, [phone]. Concerns reported: substance abuse and mental health issues for pt, refusal to cooperate with substance abuse or mental heath treatment once he leaves the hospital, threats to harm self, wife, and children while under the influence of methamphetamine. Garner Nash, MSW, LCSW Clinical Social Worker 08/01/2018 1:10 PM

## 2018-08-01 NOTE — Plan of Care (Signed)
  Problem: Activity: Goal: Interest or engagement in activities will improve Outcome: Progressing Goal: Sleeping patterns will improve Outcome: Progressing   

## 2018-08-01 NOTE — [facility] Suicide Risk Assessment (Signed)
[facility] INPATIENT:  Family/Significant Other Suicide Prevention Education  Suicide Prevention Education:  Education Completed; Rudransh Annen, [phone], has been identified by the patient as the family member/significant other with whom the patient will be residing, and identified as the person(s) who will aid the patient in the event of a mental health crisis (suicidal ideations/suicide attempt).  With written consent from the patient, the family member/significant other has been provided the following suicide prevention education, prior to the and/or following the discharge of the patient.  The suicide prevention education provided includes the following:  Suicide risk factors  Suicide prevention and interventions  National Suicide Hotline telephone number  [facility] assessment telephone number  [facility] Emergency Assistance 911  [facility] and/or Residential Mobile Crisis Unit telephone number  Request made of family/significant other to:  Remove weapons (e.g., guns, rifles, knives), all items previously/currently identified as safety concern.  No guns in the home, per Renea Ee drugs/medications (over-the-counter, prescriptions, illicit drugs), all items previously/currently identified as a safety concern.  The family member/significant other verbalizes understanding of the suicide prevention education information provided.  The family member/significant other agrees to remove the items of safety concern listed above.  Sister reports that pt stayed drug free for 2 days after last discharge.  She reported that after that, "his wife was giving their 4 year old child a bath and he slipped out" and started using meth again.  He made threats to kill his wife, to hang himself.  He has also gotten upset with sister's children, who are ages 25, 40, 34, and also in the home.  Maralyn Sago is making plans to move out. They got another IVC after this all escalated but they  are not sure what to do with pt as he continues to refuse drug treatment.  She agreed that a family meeting would be helpful--scheduled for Tuesday, 10am.  She will come along with pt father and pt wife.  Lorri Frederick, LCSW 08/01/2018, 10:33 AM

## 2018-08-01 NOTE — Progress Notes (Signed)
[facility] MD Progress Note  08/01/2018 9:28 AM [name] [name]  MRN:  [id number] Subjective:    Patient remains somewhat intrusive insistent upon discharge again he lacks insight into his condition, without methamphetamines he is generally stable however he cannot stay clean and sober, continues to relapse pretty much immediately upon discharge leading to volatility.  He still refuses inpatient rehab which has been recommended by family and treatment team. He is however compliant with medications here Sleeping well No involuntary movements no thoughts of harming self or others Requesting specifically nicotine patch  Principal Problem: Methamphetamine-induced psychotic disorder (HCC) Diagnosis: Principal Problem:   Methamphetamine-induced psychotic disorder (HCC)  Total Time spent with patient: 20 minutes Past Medical History:  Past Medical History:  Diagnosis Date  . Anxiety   . Depression   . Mental disorder   . Schizophrenia (HCC)    History reviewed. No pertinent surgical history. Family History:  Family History  Problem Relation Age of Onset  . Mental illness Neg Hx    Social History:  Social History   Substance and Sexual Activity  Alcohol Use Not Currently   Comment: haven't drank in months"     Social History   Substance and Sexual Activity  Drug Use Yes  . Types: Marijuana, Methamphetamines   Comment: Last used: 2 weeks ago    Social History   Socioeconomic History  . Marital status: Single    Spouse name: Not on file  . Number of children: Not on file  . Years of education: Not on file  . Highest education level: Not on file  Occupational History  . Not on file  Social Needs  . Financial resource strain: Not on file  . Food insecurity:    Worry: Not on file    Inability: Not on file  . Transportation needs:    Medical: Not on file    Non-medical: Not on file  Tobacco Use  . Smoking status: Current Every Day Smoker    Packs/day: 1.00    Types: Cigarettes   . Smokeless tobacco: Former Engineer, waterUser  Substance and Sexual Activity  . Alcohol use: Not Currently    Comment: haven't drank in months"  . Drug use: Yes    Types: Marijuana, Methamphetamines    Comment: Last used: 2 weeks ago  . Sexual activity: Yes    Birth control/protection: None  Lifestyle  . Physical activity:    Days per week: Not on file    Minutes per session: Not on file  . Stress: Not on file  Relationships  . Social connections:    Talks on phone: Not on file    Gets together: Not on file    Attends religious service: Not on file    Active member of club or organization: Not on file    Attends meetings of clubs or organizations: Not on file    Relationship status: Not on file  Other Topics Concern  . Not on file  Social History Narrative  . Not on file   Additional Social History:                         Sleep: Good  Appetite:  Good  Current Medications: Current Facility-Administered Medications  Medication Dose Route Frequency Provider Last Rate Last Dose  . acetaminophen (TYLENOL) tablet 650 mg  650 mg Oral Q6H PRN Malvin JohnsFarah, Korayma Hagwood, MD   650 mg at 07/31/18 1256  . alum & mag hydroxide-simeth (MAALOX/MYLANTA) 200-200-20 MG/5ML  suspension 30 mL  30 mL Oral Q4H PRN Malvin Johns, MD      . benztropine (COGENTIN) tablet 0.5 mg  0.5 mg Oral BID Malvin Johns, MD   0.5 mg at 08/01/18 0801  . benztropine (COGENTIN) tablet 0.5 mg  0.5 mg Oral BID Malvin Johns, MD      . guaiFENesin-dextromethorphan [facility] DM) 100-10 MG/5ML syrup 5 mL  5 mL Oral Q4H PRN Oneta Rack, NP   5 mL at 07/31/18 1256  . hydrOXYzine (ATARAX/VISTARIL) tablet 50 mg  50 mg Oral TID PRN Malvin Johns, MD   50 mg at 07/31/18 1256  . magnesium hydroxide (MILK OF MAGNESIA) suspension 30 mL  30 mL Oral Daily PRN Malvin Johns, MD      . nicotine (NICODERM CQ - dosed in mg/24 hours) patch 14 mg  14 mg Transdermal Daily Malvin Johns, MD      . nicotine polacrilex (NICORETTE) gum 2 mg  2 mg Oral  PRN Oneta Rack, NP      . risperiDONE (RISPERDAL) tablet 3 mg  3 mg Oral Daily Oneta Rack, NP   3 mg at 08/01/18 0802  . risperiDONE (RISPERDAL) tablet 4 mg  4 mg Oral QHS Oneta Rack, NP   4 mg at 07/31/18 2134  . traZODone (DESYREL) tablet 300 mg  300 mg Oral QHS Malvin Johns, MD   300 mg at 07/31/18 2134    Lab Results: No results found for this or any previous visit (from the past 48 hour(s)).  Blood Alcohol level:  Lab Results  Component Value Date   ETH <10 07/29/2018   ETH <10 07/16/2018    Metabolic Disorder Labs: Lab Results  Component Value Date   HGBA1C 5.7 (H) 12/19/2017   MPG 116.89 12/19/2017   MPG 128 04/02/2015   Lab Results  Component Value Date   PROLACTIN 45.8 (H) 12/19/2017   PROLACTIN 7.0 04/04/2015   Lab Results  Component Value Date   CHOL 182 12/19/2017   TRIG 204 (H) 12/19/2017   HDL 30 (L) 12/19/2017   CHOLHDL 6.1 12/19/2017   VLDL 41 (H) 12/19/2017   LDLCALC 111 (H) 12/19/2017   LDLCALC 96 04/04/2015    Physical Findings: AIMS: Facial and Oral Movements Muscles of Facial Expression: None, normal Lips and Perioral Area: None, normal Jaw: None, normal Tongue: None, normal,Extremity Movements Upper (arms, wrists, hands, fingers): None, normal Lower (legs, knees, ankles, toes): None, normal, Trunk Movements Neck, shoulders, hips: None, normal, Overall Severity Severity of abnormal movements (highest score from questions above): None, normal Incapacitation due to abnormal movements: None, normal Patient's awareness of abnormal movements (rate only patient's report): No Awareness, Dental Status Current problems with teeth and/or dentures?: No Does patient usually wear dentures?: No  CIWA:    COWS:  COWS Total Score: 0  Musculoskeletal: Strength & Muscle Tone: within normal limits Gait & Station: normal Patient leans: N/A  Psychiatric Specialty Exam: Physical Exam  ROS  Blood pressure 117/84, pulse 89, temperature 98 F  (36.7 C), temperature source Oral, resp. rate 18, height 5\' 3"  (1.6 m), weight 78.9 kg, SpO2 100 %.Body mass index is 30.82 kg/m.  General Appearance: Casual  Eye Contact:  Good  Speech:  Clear and Coherent  Volume:  Normal  Mood:  Anxious  Affect:  Appropriate  Thought Process:  Goal Directed  Orientation:  Full (Time, Place, and Person)  Thought Content:  Tangential  Suicidal Thoughts:  No  Homicidal Thoughts:  No  Memory:  NA  Judgement:  Good  Insight:  Lacking  Psychomotor Activity:  Increased  Concentration:  Concentration: Good  Recall:  Fair  Fund of Knowledge:  Fair  Language:  Fair  Akathisia:  Negative  Handed:  Right  AIMS (if indicated):     Assets:  Leisure Time Physical Health  ADL's:  Intact  Cognition:  WNL  Sleep:  Number of Hours: 6.75     Treatment Plan Summary: Daily contact with patient to assess and evaluate symptoms and progress in treatment Continue to encourage some type of meaningful rehab, continue current precautions and rehab based therapies, continue to speak with family members again they are more eager for patient have rehab than he has.  Continue to monitor for safety and current precautions will remain the same   Malvin JohnsFARAH,Annelle Behrendt, MD 08/01/2018, 9:28 AM

## 2018-08-02 NOTE — Progress Notes (Addendum)
Adult Services Patient-Family Contact/Session  Attendees:  Patient, CSW, older sister Dolores Hoose, sister Lattie Haw, wife Hich.  Goal(s):  Develop discharge plan, address child protective service report.  Safety Concerns:  Client drug use has led to his making threats to others, 4 minor children in the home.   Narrative:  CSW met with family initially, informed them of CPS report made by CPS earlier in the day.  All three were supportive of this and hope that it will help to get pt into substance use treatment.  They are all agreed that pt would need to leave if he continues to be resistant to treatment and CPS requires it, would focus on safety of the children.  Pt joined [place], CSW informed him of the CPS report as well.  Pt reports that he is willing to attend residential substance abuse treatment at this time.  Pt sisters and wife all verbalized to pt that they will not risk safety of the children if he continues in his current course.  Pt handled this very well.  Plan made to make referrals to [facility] and see if pt can go directly from [facility] to treatment.   Barrier(s):    Interventions:    Recommendation(s):  Residential substance abuse treatment.   Follow-up Required:  Yes  Explanation:  CSW will make referrals.  Joanne Chars 08/02/2018, 1:26 PM

## 2018-08-02 NOTE — Progress Notes (Signed)
Patient ID: [name] [name], male   DOB: Nov 22, 1980, 38 y.o.   MRN: [id number] PER STATE REGULATIONS 482.30  THIS CHART WAS REVIEWED FOR MEDICAL NECESSITY WITH RESPECT TO THE PATIENT'S ADMISSION/ DURATION OF STAY.  NEXT REVIEW DATE: 08/06/2018  Willa Rough, RN, BSN CASE MANAGER

## 2018-08-02 NOTE — B[facility] Group Notes (Signed)
[facility] LCSW Group Therapy Note  Date/Time:08/01/18, 1300  Type of Therapy and Topic:  Group Therapy:  Overcoming Obstacles  Participation Level:  active  Description of Group:    In this group patients will be encouraged to explore what they see as obstacles to their own wellness and recovery. They will be guided to discuss their thoughts, feelings, and behaviors related to these obstacles. The group will process together ways to cope with barriers, with attention given to specific choices patients can make. Each patient will be challenged to identify changes they are motivated to make in order to overcome their obstacles. This group will be process-oriented, with patients participating in exploration of their own experiences as well as giving and receiving support and challenge from other group members.  Therapeutic Goals: 1. Patient will identify personal and current obstacles as they relate to admission. 2. Patient will identify barriers that currently interfere with their wellness or overcoming obstacles.  3. Patient will identify feelings, thought process and behaviors related to these barriers. 4. Patient will identify two changes they are willing to make to overcome these obstacles:    Summary of Patient Progress: Pt identified addiction as the biggest obstacle in his life presently.  Pt active during group discussion regarding positive steps to overcome obstacles.      Therapeutic Modalities:   Cognitive Behavioral Therapy Solution Focused Therapy Motivational Interviewing Relapse Prevention Therapy  Daleen Squibb, LCSW

## 2018-08-02 NOTE — Progress Notes (Signed)
Patient denies SI, HI and AVH this shift.   Patient has been noted to be pacing the halls, talking to peers, Patient has had no incidents of behavioral dyscontrol.   Assess patient for safety, offer medications as prescribed, engage patient in 1:1 staff talk.   Patient able to contract for safety, Continue to monitor as planned.

## 2018-08-02 NOTE — BHH Group Notes (Addendum)
B[facility] LCSW Group Therapy Note  Date/Time: 08/02/18, 1100  Type of Therapy/Topic:  Group Therapy:  Feelings about Diagnosis  Participation Level:  Active   Mood:pleasant   Description of Group:    This group will allow patients to explore their thoughts and feelings about diagnoses they have received. Patients will be guided to explore their level of understanding and acceptance of these diagnoses. Facilitator will encourage patients to process their thoughts and feelings about the reactions of others to their diagnosis, and will guide patients in identifying ways to discuss their diagnosis with significant others in their lives. This group will be process-oriented, with patients participating in exploration of their own experiences as well as giving and receiving support and challenge from other group members.   Therapeutic Goals: 1. Patient will demonstrate understanding of diagnosis as evidence by identifying two or more symptoms of the disorder:  2. Patient will be able to express two feelings regarding the diagnosis 3. Patient will demonstrate ability to communicate their needs through discussion and/or role plays  Summary of Patient Progress: Pt identified schizophrenia and bipolar disorder as his current diagnoses.  Pt active during group discussion regarding symptoms of these disorders and recognizing when symptoms are worsening earlier.           Therapeutic Modalities:   Cognitive Behavioral Therapy Brief Therapy Feelings Identification   Daleen Squibb, LCSW

## 2018-08-02 NOTE — Progress Notes (Signed)
[facility]  08/02/2018 10:01 AM [name] [name]  MRN:  [id number] Subjective:    Patient reports he is eager to be discharged but does agree to rehab type therapy going forward has no thoughts of harming self no cravings tremors or withdrawal again without methamphetamine psychosis has resolved but we have discussed the safety of staying on antipsychotics in the meantime as many patients with methamphetamine induced psychosis have a recurrence  Principal Problem: Methamphetamine-induced psychotic disorder (HCC) Diagnosis: Principal Problem:   Methamphetamine-induced psychotic disorder (HCC)  Total Time spent with patient: 20 minutes  Past Medical History:  Past Medical History:  Diagnosis Date  . Anxiety   . Depression   . Mental disorder   . Schizophrenia (HCC)    History reviewed. No pertinent surgical history. Family History:  Family History  Problem Relation Age of Onset  . Mental illness Neg Hx   Social History:  Social History   Substance and Sexual Activity  Alcohol Use Not Currently   Comment: haven't drank in months"     Social History   Substance and Sexual Activity  Drug Use Yes  . Types: Marijuana, Methamphetamines   Comment: Last used: 2 weeks ago    Social History   Socioeconomic History  . Marital status: Single    Spouse name: Not on file  . Number of children: Not on file  . Years of education: Not on file  . Highest education level: Not on file  Occupational History  . Not on file  Social Needs  . Financial resource strain: Not on file  . Food insecurity:    Worry: Not on file    Inability: Not on file  . Transportation needs:    Medical: Not on file    Non-medical: Not on file  Tobacco Use  . Smoking status: Current Every Day Smoker    Packs/day: 1.00    Types: Cigarettes  . Smokeless tobacco: Former Engineer, waterUser  Substance and Sexual Activity  . Alcohol use: Not Currently    Comment: haven't drank in months"  . Drug use: Yes    Types:  Marijuana, Methamphetamines    Comment: Last used: 2 weeks ago  . Sexual activity: Yes    Birth control/protection: None  Lifestyle  . Physical activity:    Days per week: Not on file    Minutes per session: Not on file  . Stress: Not on file  Relationships  . Social connections:    Talks on phone: Not on file    Gets together: Not on file    Attends religious service: Not on file    Active member of club or organization: Not on file    Attends meetings of clubs or organizations: Not on file    Relationship status: Not on file  Other Topics Concern  . Not on file  Social History Narrative  . Not on file   Sleep: Fair  Appetite:  Good  Current Medications: Current Facility-Administered Medications  Medication Dose Route Frequency Provider Last Rate Last Dose  . acetaminophen (TYLENOL) tablet 650 mg  650 mg Oral Q6H PRN Malvin JohnsFarah, Juanell Saffo, MD   650 mg at 07/31/18 1256  . alum & mag hydroxide-simeth (MAALOX/MYLANTA) 200-200-20 MG/5ML suspension 30 mL  30 mL Oral Q4H PRN Malvin JohnsFarah, Domnique Vanegas, MD      . benztropine (COGENTIN) tablet 0.5 mg  0.5 mg Oral BID Malvin JohnsFarah, Erman Thum, MD   0.5 mg at 08/02/18 0801  . guaiFENesin-dextromethorphan (ROBITUSSIN DM) 100-10 MG/5ML syrup  5 mL  5 mL Oral Q4H PRN Oneta Rack, NP   5 mL at 08/02/18 0952  . hydrOXYzine (ATARAX/VISTARIL) tablet 50 mg  50 mg Oral TID PRN Malvin Johns, MD   50 mg at 07/31/18 1256  . magnesium hydroxide (MILK OF MAGNESIA) suspension 30 mL  30 mL Oral Daily PRN Malvin Johns, MD      . nicotine (NICODERM CQ - dosed in mg/24 hours) patch 14 mg  14 mg Transdermal Daily Malvin Johns, MD   14 mg at 08/02/18 0800  . nicotine polacrilex (NICORETTE) gum 2 mg  2 mg Oral PRN Oneta Rack, NP      . risperiDONE (RISPERDAL) tablet 3 mg  3 mg Oral Daily Oneta Rack, NP   3 mg at 08/02/18 0801  . risperiDONE (RISPERDAL) tablet 4 mg  4 mg Oral QHS Oneta Rack, NP   4 mg at 08/01/18 2047  . traZODone (DESYREL) tablet 300 mg  300 mg Oral QHS  Malvin Johns, MD   300 mg at 08/01/18 2047    Lab Results:  Results for orders placed or performed during the hospital encounter of 07/29/18 (from the past 48 hour(s))  Influenza panel by PCR (type A & B)     Status: None   Collection Time: 08/01/18  4:31 PM  Result Value Ref Range   Influenza A By PCR NEGATIVE NEGATIVE   Influenza B By PCR NEGATIVE NEGATIVE    Comment: (NOTE) The Xpert Xpress Flu assay is intended as an aid in the diagnosis of  influenza and should not be used as a sole basis for treatment.  This  assay is FDA approved for nasopharyngeal swab specimens only. Nasal  washings and aspirates are unacceptable for Xpert Xpress Flu testing. Performed at [facility], [address] [place], [place] [address]     Blood Alcohol level:  Lab Results  Component Value Date   [facility] <10 07/29/2018   ETH <10 07/16/2018    Metabolic Disorder Labs: Lab Results  Component Value Date   HGBA1C 5.7 (H) 12/19/2017   MPG 116.89 12/19/2017   MPG 128 04/02/2015   Lab Results  Component Value Date   PROLACTIN 45.8 (H) 12/19/2017   PROLACTIN 7.0 04/04/2015   Lab Results  Component Value Date   CHOL 182 12/19/2017   TRIG 204 (H) 12/19/2017   HDL 30 (L) 12/19/2017   CHOLHDL 6.1 12/19/2017   VLDL 41 (H) 12/19/2017   LDLCALC 111 (H) 12/19/2017   LDLCALC 96 04/04/2015    Physical Findings: AIMS: Facial and Oral Movements Muscles of Facial Expression: None, normal Lips and Perioral Area: None, normal Jaw: None, normal Tongue: None, normal,Extremity Movements Upper (arms, wrists, hands, fingers): None, normal Lower (legs, knees, ankles, toes): None, normal, Trunk Movements Neck, shoulders, hips: None, normal, Overall Severity Severity of abnormal movements (highest score from questions above): None, normal Incapacitation due to abnormal movements: None, normal Patient's awareness of abnormal movements (rate only patient's report): No Awareness, Dental  Status Current problems with teeth and/or dentures?: No Does patient usually wear dentures?: No  CIWA:    COWS:  COWS Total Score: 0  Musculoskeletal: Strength & Muscle Tone: within normal limits Gait & Station: normal Patient leans: N/A  Psychiatric Specialty Exam: Physical Exam  ROS  Blood pressure 120/82, pulse 78, temperature 98 F (36.7 C), resp. rate 18, height 5\' 3"  (1.6 m), weight 78.9 kg, SpO2 100 %.Body mass index is 30.82 kg/m.  General Appearance: Casual  Eye Contact:  Good  Speech:  Clear and Coherent  Volume:  Normal  Mood:  Euthymic  Affect:  Congruent and Constricted  Thought Process:  Goal Directed  Orientation:  Full (Time, Place, and Person)  Thought Content:  Tangential  Suicidal Thoughts:  No  Homicidal Thoughts:  No  Memory:  Immediate;   Fair  Judgement:  Fair  Insight:  Fair  Psychomotor Activity:  Normal  Concentration:  Concentration: Good  Recall:  Good  Fund of Knowledge:  Negative  Language:  Good  Akathisia:  Negative  Handed:  Right  AIMS (if indicated):     Assets:  Physical Health  ADL's:  Intact  Cognition:  WNL  Sleep:  Number of Hours: 5.75     Treatment Plan Summary: Daily contact with patient to assess and evaluate symptoms and progress in treatment, Medication management and Plan Continue current cognitive and rehab based therapies, continue to encourage rehab patient has agreed to [facility] residential or similar and we will try and arrange this prior to discharge  Malvin Johns, MD 08/02/2018, 10:01 AM

## 2018-08-02 NOTE — Progress Notes (Signed)
Recreation Therapy Notes  Date: 1.28.20 Time: 1000 Location: 500 Hall Dayroom  Group Topic: Coping Skills  Goal Area(s) Addresses:  Patient will identify positive coping skills. Patient will identify benefit of using coping skills post d/c.  Behavioral Response: Engaged  Intervention: Worksheet, pencil  Activity:  Mind map.  LRT and patients filled out the first 8 boxes together with anger, depression, jobs, love, inappropriate comments, relationships, health and happiness.  Patients were to individually identify at least 3 coping skills for each of the problems identified.  The group would then come back together and the LRT would write the coping skills on the board.    Education: Pharmacologist, Building control surveyor.   Education Outcome: Acknowledges understanding/In group clarification offered/Needs additional education.   Clinical Observations/Feedback:  Pt stated coping skills help you feel better.  Pt also expressed he reacts in a positive manner when people do things to him.  Pt identified some of his coping skills as positive thinking/talk, make music for anger; call friend/family, look at pictures for depression; give love/show love for love; agree to disagree, be a friend for relationship; get a check up for health; and be happy for happiness.    Caroll Rancher, LRT/CTRS     Caroll Rancher A 08/02/2018 10:49 AM

## 2018-08-03 NOTE — BHH Suicide Risk Assessment (Signed)
[facility] Discharge Suicide Risk Assessment   Principal Problem: Methamphetamine-induced psychotic disorder [facility]) Discharge Diagnoses: Principal Problem:   Methamphetamine-induced psychotic disorder (HCC) Current mental status exam-patient is alert oriented still intrusive but not manic continues to lobbied for discharge denies any psychotic symptoms denies wanting to harm self or others insisting he will stay drug-free this time we have repeatedly told him that his main issue is methamphetamine induced psychosis and he needs to abstain from illicit compounds at all costs  Total Time spent with patient: 30 minutes   Mental Status Per Nursing Assessment::   On Admission:  Suicidal ideation indicated by patient, Thoughts of violence towards others  Demographic Factors:  Male  Loss Factors: Decrease in vocational status  Historical Factors: Impulsivity  Risk Reduction Factors:   Sense of responsibility to family  Continued Clinical Symptoms:  Alcohol/Substance Abuse/Dependencies  Cognitive Features That Contribute To Risk:  None    Suicide Risk:  Minimal: No identifiable suicidal ideation.  Patients presenting with no risk factors but with morbid ruminations; may be classified as minimal risk based on the severity of the depressive symptoms  Follow-up Information    Services, [facility] Follow up on 08/11/2018.   Why:  Please attend your screening for possible admission on 2/6 at 7:45a. Please bring your photo ID, proof of insurance, 30 day supply of medication and clothing.  Contact information: [address] [place] [address] [phone]        [facility] Follow up.   Specialty:  Addiction Medicine Why:  Referral made 1/27. Contact information: [address] [place] [address] [phone]         Greatest risk to this patient is not intentional self-harm but unintentional self-harm due to polysubstance  abuse/methamphetamine abuse and related psychosis  Plan Of Care/Follow-up recommendations:  Activity:  full  Azriel Jakob, MD 08/03/2018, 8:12 AM

## 2018-08-03 NOTE — Therapy (Signed)
Occupational Therapy Group Note  Date:  08/03/2018 Time:  4:55 PM  Group Topic/Focus:  Stress Management  Participation Level:  Active  Participation Quality:  Redirectable  Affect:  Blunted  Cognitive:  Appropriate  Insight: Lacking  Engagement in Group:  Off Topic  Modes of Intervention:  Activity, Discussion, Education and Socialization  Additional Comments:    S: "Nobody cares about me or is willing to help me not even my family"  O: Education given on stress management. Ice breaker activity completed with reflective questions and sensorimotor activity of throwing a ball to increase engagement. Pt to brainstorm positive stress management strategies. Art activity then completed to showcase preferred healthy coping mechanisms.   A: Pt presents with flat affect, recently getting off phone and upset about the conversation. He is fixated and tangentially explaining how no one wants to help or support him and that everyone is out to hurt him. Pt needing significant cues and redirection whenever family or children brought up in conservation during group. Once engaged in stress management activity, pt able to formulate appropriate stress management list with little difficulty. Pt also very animated when engaging in craft, seen laughing and appropriately sharing with peers.  P: OT group will continue once per week while pt inpatient.  Dalphine Handing, MSOT, OTR/L Behavioral Health OT/ Acute Relief OT PHP Office: [phone]  Dalphine Handing 08/03/2018, 4:55 PM

## 2018-08-03 NOTE — Progress Notes (Signed)
Adult Psychoeducational Group Note  Date:  08/03/2018 Time:  8:04 PM  Group Topic/Focus:  Wrap-Up Group:   The focus of this group is to help patients review their daily goal of treatment and discuss progress on daily workbooks.  Participation Level:  Active  Participation Quality:  Appropriate  Affect:  Appropriate  Cognitive:  Appropriate  Insight: Appropriate  Engagement in Group:  Engaged  Modes of Intervention:  Discussion  Additional Comments: The patient expressed that he attended groups.The patient also said that he rates today a 10.  Octavio Manns 08/03/2018, 8:04 PM

## 2018-08-03 NOTE — Plan of Care (Signed)
  Problem: Activity: Goal: Interest or engagement in activities will improve Outcome: Progressing   Problem: Coping: Goal: Ability to verbalize frustrations and anger appropriately will improve Outcome: Progressing   D: Pt alert and oriented on the unit. Pt engaging with RN staff and other pts. Pt denies SI/HI, A/VH. Pt became irritated because he was waiting to talk with the social worker. Pt  paced the hallway and was preoccupied with talking to the CSW. Pt calmed and then watched television after speaking with the social worker. Pt attended group and was cooperative.  A: Education, support and encouragement provided, q15 minute safety checks remain in effect. Medications administered per MD orders.  R: No reactions/side effects to medicine noted. Pt denies any concerns at this time, and verbally contracts for safety. Pt ambulating on the unit with no issues. Pt remains safe on and off the unit.

## 2018-08-03 NOTE — Progress Notes (Signed)
D:  [name] [name] has been hyper-verbal and fidgety this evening.  He was hyper focused on getting discharged soon and that he doesn't want substance abuse treatment.  He believes that he needs to leave to make sure his daughter is ok.  He was paranoid and delusional about MD's and nurses overmedicating him as well as putting shot into him for no reason.  He became agitated multiple times while on the phone and he had to be redirected to his room.  He would argue with staff at times and continually stated he just wanted to be discharged, "I can walk home, do you think they will let me."  Encouraged him to talk with MD and SW tomorrow about discharge plans.  He denied SI/HI or A/V hallucinations.  He took his hs medications without difficulty and prn for anxiety given.  He was able to finally lay down for the evening.  He is currently resting with his eyes closed and appears to be asleep. A:  1:1 with RN for support and encouragement.  Medications as ordered.  Q 15 minute checks maintained for safety.  Encouraged participation in group and unit activities.   R:  Dontrae remains safe on the unit.  We will continue to monitor the progress towards his goals.

## 2018-08-03 NOTE — Progress Notes (Signed)
D: Pt was in dayroom upon initial approach.  He smiles with interaction.  Pt presents with appropriate affect and mood.  He describes his day as "good."  Speech is rapid and he is hyperactive at times.  Reports goal is to "go to rehab as soon as possible."  Pt reports he may discharge soon.  Pt denies SI/HI, denies hallucinations, denies pain.  Pt has been visible in milieu interacting with peers and staff appropriately.  Pt attended evening group.    A: Introduced self to pt.  Met with pt 1:1.  Actively listened to pt and offered support and encouragement.  Medications administered per order.  PRN medication administered for anxiety.  Q15 minute safety checks maintained.  R: Pt is safe on the unit.  Pt is compliant with medications.  Pt verbally contracts for safety.  Will continue to monitor and assess.

## 2018-08-03 NOTE — Progress Notes (Addendum)
CSW spoke with pt sister Maralyn Sago.  She has not heard anything from DSS yet.  Jajaun called all day yesterday telling them that he had changed his mind and was not going to go to treatment. Elijha's wife and Sarah's sister are going to police currently to pursue a restraining order against Jamorian.  Maralyn Sago has taken her children to her sister's home. Hich, (Keidan's wife) has taken their child to her grandmother's home.  None of the children will be in the home right now and they are still going to pursue the restraining order as well.    CSW spoke to [employer], intake worker at [facility].  The CPS report was accepted and assigned to [facility], [phone].  72 hour report time, so she would need to see the family today.  Albin Felling Supervisor: Caro Hight, [phone].  CSW LM with Walker Kehr, [place] [facility]. Garner Nash, MSW, LCSW Clinical Social Worker 08/03/2018 9:23 AM   CSW spoke with Walker Kehr, [place] [facility].  She is planning to see the children today.  Requested that we hold pt until she can do this and get safety plan in place. CSW updated her that family is pursuing 50B, which she said is a good idea.  She will call Julienne Bernson and set up time to meet.   Garner Nash, MSW, LCSW Clinical Social Worker 08/03/2018 9:58 AM   CSW received call from pt sister, Hjiem.  They could not complete 50B today because the court required a translator for pt wife, Hich.  They were called by the magistrate a little while ago and requested to return to complete everything tomorrow at 830am.  They were also told to be prepared to go across the street to appear in court tomorrow as well.  They are asking that pt be held until tomorrow. Garner Nash, MSW, LCSW Clinical Social Worker 08/03/2018 12:49 PM

## 2018-08-03 NOTE — Progress Notes (Signed)
Recreation Therapy Notes  Date: 1.29.20 Time: 1000 Location: 500 Hall Dayroom  Group Topic: Goal Setting  Goal Area(s) Addresses:  Patient will be able to identify at least 3 goals.  Patient will be able to identify benefit of investing in goals.  Patient will be able to identify benefit of setting goals.   Behavioral Response:  Engaged  Intervention: Worksheet, pencils  Activity: Goal Planning.  Patients were to come up with goals they wanted to accomplish in a week, month, 1 year and 5 years.  Patients were to then identify any obstacles they may face while working towards their goals, what they need to be successful in reaching their goals and what they can start doing now to work towards their goals.  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  Pt stated his obstacles were negative talk, threats from people and not taking medication.  Pt also stated he would need to relax, do good, stay away from family for now and call DSS to get visitation with his daughter.  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     Caroll Rancher, LRT/CTRS     Caroll Rancher A 08/03/2018 11:23 AM

## 2018-08-03 NOTE — Progress Notes (Signed)
[facility] MD Progress Note  08/03/2018 9:53 AM [name] [name]  MRN:  [id number] Subjective:    Patient has generally stabilized of course without the methamphetamines he is pretty much baseline he was scheduled for discharge today however we have not heard from DSS, and family needs time to get the restraining order in place.  Therefore we will hold off on discharge until tomorrow. Patient irritable demanding discharge pressured and intrusive again very very focused on discharge confronting me every time he sees me on the ward demanding discharge  Principal Problem: Methamphetamine-induced psychotic disorder (HCC) Diagnosis: Principal Problem:   Methamphetamine-induced psychotic disorder (HCC)  Total Time spent with patient: 20 minutes Past Medical History:  Past Medical History:  Diagnosis Date  . Anxiety   . Depression   . Mental disorder   . Schizophrenia (HCC)    History reviewed. No pertinent surgical history. Family History:  Family History  Problem Relation Age of Onset  . Mental illness Neg Hx     Social History:  Social History   Substance and Sexual Activity  Alcohol Use Not Currently   Comment: haven't drank in months"     Social History   Substance and Sexual Activity  Drug Use Yes  . Types: Marijuana, Methamphetamines   Comment: Last used: 2 weeks ago    Social History   Socioeconomic History  . Marital status: Single    Spouse name: Not on file  . Number of children: Not on file  . Years of education: Not on file  . Highest education level: Not on file  Occupational History  . Not on file  Social Needs  . Financial resource strain: Not on file  . Food insecurity:    Worry: Not on file    Inability: Not on file  . Transportation needs:    Medical: Not on file    Non-medical: Not on file  Tobacco Use  . Smoking status: Current Every Day Smoker    Packs/day: 1.00    Types: Cigarettes  . Smokeless tobacco: Former Engineer, water and Sexual Activity  .  Alcohol use: Not Currently    Comment: haven't drank in months"  . Drug use: Yes    Types: Marijuana, Methamphetamines    Comment: Last used: 2 weeks ago  . Sexual activity: Yes    Birth control/protection: None  Lifestyle  . Physical activity:    Days per week: Not on file    Minutes per session: Not on file  . Stress: Not on file  Relationships  . Social connections:    Talks on phone: Not on file    Gets together: Not on file    Attends religious service: Not on file    Active member of club or organization: Not on file    Attends meetings of clubs or organizations: Not on file    Relationship status: Not on file  Other Topics Concern  . Not on file  Social History Narrative  . Not on file   Additional Social History:                         Sleep: Fair  Appetite:  Fair  Current Medications: Current Facility-Administered Medications  Medication Dose Route Frequency Provider Last Rate Last Dose  . acetaminophen (TYLENOL) tablet 650 mg  650 mg Oral Q6H PRN Malvin Johns, MD   650 mg at 07/31/18 1256  . alum & mag hydroxide-simeth (MAALOX/MYLANTA) 200-200-20 MG/5ML suspension 30  mL  30 mL Oral Q4H PRN Malvin JohnsFarah, Cline Draheim, MD      . benztropine (COGENTIN) tablet 0.5 mg  0.5 mg Oral BID Malvin JohnsFarah, Tylek Boney, MD   0.5 mg at 08/03/18 [id number]  . guaiFENesin-dextromethorphan (ROBITUSSIN DM) 100-10 MG/5ML syrup 5 mL  5 mL Oral Q4H PRN Oneta RackLewis, Tanika N, NP   5 mL at 08/02/18 0952  . hydrOXYzine (ATARAX/VISTARIL) tablet 50 mg  50 mg Oral TID PRN Malvin JohnsFarah, Aneesh Faller, MD   50 mg at 08/02/18 2101  . magnesium hydroxide (MILK OF MAGNESIA) suspension 30 mL  30 mL Oral Daily PRN Malvin JohnsFarah, Cedrica Brune, MD      . nicotine (NICODERM CQ - dosed in mg/24 hours) patch 14 mg  14 mg Transdermal Daily Malvin JohnsFarah, Keiton Cosma, MD   14 mg at 08/03/18 0824  . nicotine polacrilex (NICORETTE) gum 2 mg  2 mg Oral PRN Oneta RackLewis, Tanika N, NP      . risperiDONE (RISPERDAL) tablet 3 mg  3 mg Oral Daily Oneta RackLewis, Tanika N, NP   3 mg at 08/03/18 0824   . risperiDONE (RISPERDAL) tablet 4 mg  4 mg Oral QHS Oneta RackLewis, Tanika N, NP   4 mg at 08/02/18 2100  . traZODone (DESYREL) tablet 300 mg  300 mg Oral QHS Malvin JohnsFarah, Kahlil Cowans, MD   300 mg at 08/02/18 2101    Lab Results:  Results for orders placed or performed during the hospital encounter of 07/29/18 (from the past 48 hour(s))  Influenza panel by PCR (type A & B)     Status: None   Collection Time: 08/01/18  4:31 PM  Result Value Ref Range   Influenza A By PCR NEGATIVE NEGATIVE   Influenza B By PCR NEGATIVE NEGATIVE    Comment: (NOTE) The Xpert Xpress Flu assay is intended as an aid in the diagnosis of  influenza and should not be used as a sole basis for treatment.  This  assay is FDA approved for nasopharyngeal swab specimens only. Nasal  washings and aspirates are unacceptable for Xpert Xpress Flu testing. Performed at [facility], [address] [place], [place] [id number]     Blood Alcohol level:  Lab Results  Component Value Date   [facility] <10 07/29/2018   ETH <10 07/16/2018    Metabolic Disorder Labs: Lab Results  Component Value Date   HGBA1C 5.7 (H) 12/19/2017   MPG 116.89 12/19/2017   MPG 128 04/02/2015   Lab Results  Component Value Date   PROLACTIN 45.8 (H) 12/19/2017   PROLACTIN 7.0 04/04/2015   Lab Results  Component Value Date   CHOL 182 12/19/2017   TRIG 204 (H) 12/19/2017   HDL 30 (L) 12/19/2017   CHOLHDL 6.1 12/19/2017   VLDL 41 (H) 12/19/2017   LDLCALC 111 (H) 12/19/2017   LDLCALC 96 04/04/2015    Physical Findings: AIMS: Facial and Oral Movements Muscles of Facial Expression: None, normal Lips and Perioral Area: None, normal Jaw: None, normal Tongue: None, normal,Extremity Movements Upper (arms, wrists, hands, fingers): None, normal Lower (legs, knees, ankles, toes): None, normal, Trunk Movements Neck, shoulders, hips: None, normal, Overall Severity Severity of abnormal movements (highest score from questions above): None,  normal Incapacitation due to abnormal movements: None, normal Patient's awareness of abnormal movements (rate only patient's report): No Awareness, Dental Status Current problems with teeth and/or dentures?: No Does patient usually wear dentures?: No  CIWA:    COWS:  COWS Total Score: 0  Musculoskeletal: Strength & Muscle Tone: within normal limits Gait &  Station: normal Patient leans: N/A  Psychiatric Specialty Exam: Physical Exam  ROS  Blood pressure 120/82, pulse 78, temperature 98 F (36.7 C), resp. rate 18, height 5\' 3"  (1.6 m), weight 78.9 kg, SpO2 100 %.Body mass index is 30.82 kg/m.  General Appearance: Casual  Eye Contact:  Good  Speech:  Clear and Coherent  Volume:  Increased  Mood:  Euthymic  Affect:  Appropriate  Thought Process:  Goal Directed  Orientation:  Full (Time, Place, and Person)  Thought Content:  Tangential  Suicidal Thoughts:  No  Homicidal Thoughts:  No  Memory:  Immediate;   Fair  Judgement:improved as not impaired  Insight:  Shallow  Psychomotor Activity:  Restlessness  Concentration:  Concentration: Fair  Recall:  [employer] of Knowledge:  Fair  Language:  Fair  Akathisia:  Negative  Handed:  Right  AIMS (if indicated):     Assets:  Physical Health Resilience  ADL's:  Intact  Cognition:  WNL  Sleep:  Number of Hours: 5.25     Treatment Plan Summary: Daily contact with patient to assess and evaluate symptoms and progress in treatment, Medication management and Plan Continue cognitive and reality based therapies continue rehab based therapies continue current milieu therapy probable discharge tomorrow but again restraining order pending  Anjolie Majer, MD 08/03/2018, 9:53 AM

## 2018-08-04 MED ORDER — TRAZODONE HCL 300 MG PO TABS: 300.0000 mg | ORAL_TABLET | Freq: Every day | ORAL | 0 refills | Status: DC

## 2018-08-04 MED ORDER — HYDROXYZINE HCL 50 MG PO TABS: 50.0000 mg | ORAL_TABLET | Freq: Three times a day (TID) | ORAL | 0 refills | Status: DC | PRN

## 2018-08-04 MED ORDER — NICOTINE 14 MG/24HR TD PT24: 14.0000 mg | MEDICATED_PATCH | Freq: Every day | TRANSDERMAL | 0 refills | Status: DC

## 2018-08-04 MED ORDER — RISPERIDONE 4 MG PO TABS: 4.0000 mg | ORAL_TABLET | Freq: Every day | ORAL | 0 refills | Status: DC

## 2018-08-04 MED ORDER — BENZTROPINE MESYLATE 0.5 MG PO TABS: 0.5000 mg | ORAL_TABLET | Freq: Two times a day (BID) | ORAL | 0 refills | Status: DC

## 2018-08-04 MED ORDER — RISPERIDONE 3 MG PO TABS: 3.0000 mg | ORAL_TABLET | Freq: Every day | ORAL | 0 refills | Status: DC

## 2018-08-04 NOTE — Progress Notes (Addendum)
Patient ID: [name] [name], male   DOB: July 11, 1980, 38 y.o.   MRN: [id number] Patient discharged to home/self care in the presence of his sister.  Patient denies SI, HI and AVH.  Patient acknowledged discharge instructions and receipt of personal belongings. Patient discharged to home/self care.

## 2018-08-04 NOTE — Discharge Summary (Signed)
Physician Discharge Summary Note  Patient:  [name] [name] is an 38 y.o., male  MRN:  [id number]  DOB:  12-Sep-1980  Patient phone:  [phone] (home)   Patient address:   [address] [place] [address],   Total Time spent with patient: Greater than 30 minutes  Date of Admission:  07/29/2018  Date of Discharge: 08-04-2018  Reason for Admission: Drug induced psychosis, medication noncompliant & relapse on drugs.   Principal Problem: Methamphetamine-induced psychotic disorder [facility])  Discharge Diagnoses: Patient Active Problem List   Diagnosis Date Noted  . Schizoaffective disorder, bipolar type (HCC) [F25.0] 05/07/2016    Priority: High  . Methamphetamine-induced psychotic disorder (HCC) [F15.959] 07/29/2018  . 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 Schizophrenia, paranoid type (HCC) [F20.0] 12/17/2017  . Cocaine abuse (HCC) [F14.10] 05/07/2016  . Cannabis use disorder, moderate, in sustained remission (HCC) [F12.21] 04/03/2015  . Alcohol use disorder, moderate, in sustained remission (HCC) [F10.21] 04/03/2015   Past Psychiatric History: Hx. Amphetamine use disorder, Schizophrenia, Cocaine use disorder, Cannabis & alcohol use disorder.  Past Medical History:  Past Medical History:  Diagnosis Date  . Anxiety   . Depression   . Mental disorder   . Schizophrenia (HCC)    History reviewed. No pertinent surgical history. Family History:  Family History  Problem Relation Age of Onset  . Mental illness Neg Hx    Family Psychiatric  History: See H&P.  Social History:  Social History   Substance and Sexual Activity  Alcohol Use Not Currently   Comment: haven't drank in months"     Social History   Substance and Sexual Activity  Drug Use Yes  . Types: Marijuana, Methamphetamines   Comment: Last used: 2 weeks ago    Social History   Socioeconomic History  . Marital status: Single    Spouse  name: Not on file  . Number of children: Not on file  . Years of education: Not on file  . Highest education level: Not on file  Occupational History  . Not on file  Social Needs  . Financial resource strain: Not on file  . Food insecurity:    Worry: Not on file    Inability: Not on file  . Transportation needs:    Medical: Not on file    Non-medical: Not on file  Tobacco Use  . Smoking status: Current Every Day Smoker    Packs/day: 1.00    Types: Cigarettes  . Smokeless tobacco: Former Engineer, water and Sexual Activity  . Alcohol use: Not Currently    Comment: haven't drank in months"  . Drug use: Yes    Types: Marijuana, Methamphetamines    Comment: Last used: 2 weeks ago  . Sexual activity: Yes    Birth control/protection: None  Lifestyle  . Physical activity:    Days per week: Not on file    Minutes per session: Not on file  . Stress: Not on file  Relationships  . Social connections:    Talks on phone: Not on file    Gets together: Not on file    Attends religious service: Not on file    Active member of club or organization: Not on file    Attends meetings of clubs or organizations: Not on file    Relationship status: Not on file  Other Topics Concern  . Not on file  Social History Narrative  . Not on file   Hospital Course: (Per  Md's admission evaluation): This 38 year old male is well-known to the service was actually last here and discharged on 1/17 but unfortunately relapsed again on methamphetamines & is suffering from a drug-induced psychosis again this is a pretty similar presentation for him. At the point of discharge she refused long-term rehab and inpatient rehab stays. The chief complaint involved threats to kill his father and even his entire family, abuse of cannabis methamphetamines and crack cocaine although drug screen only shows the methamphetamines. In the emergency department as recently as last evening he was quite delusional stating that ghosts  and doubles were squeezing his head and again was tangential disorganized paranoid and   This another discharge summary for [name] [name] from [facility] in a relatively short amount of time. He was recently treated & stabilized on medications for worsening psychosis. He was discharged with medications, a recommendation & appointment to continue mental health care & medication management on outpatient basis. [facility], he relapsed on drugs & has to be re-admitted for drug induced psychosis.  After the above re-admission assessment, it was recommended that [name] [name] will need mood stabilization treatment. The medication regimen targeting those presenting symptoms were discussed & initiated. He received & was discharged on the medications as listed below. He was also enrolled & participated in the group counseling sessions being offered & held on this unit. He learned coping skills that should help him cope better to maintain mood stability & sobriety after discharge. He presented no other significant pre-existing medical issues that required treatment & or monitoring. He tolerated his treatment regimen without any adverse effects or reactions reported.  Once again, [name] [name]'s symptoms responded well to his treatment regimen. This is evidenced by his reports of improved mood, absence of AVH, delusional thoughts/paranoia. He is currently stable to be discharged to continue mental health care & medication management on an outpatient basis as noted below. He is provided with all the necessary information needed to make this appointment without problems.  Today upon this discharge evaluation, [name] [name] presents stable. There are no evidence of psychosis. No depressive symptoms. There are no anxiety symptoms. He seem mentally & medically stable. He denies any physical complaints. He denies SI/HI/AH/VH. He is tolerating his medications well and he is in agreement to continue his current regimen without changes. He plans to  [facility]. He is in agreement to join AA/NA weekly meetings to help him maintain sobriety. He was able to engage in safety planning including plan to [facility] or contact emergency services if he feels unable to maintain his own safety or the safety of others. Pt had no further questions, comments, or concerns.[facility] with all personal belongings in no apparent distress.  Physical Findings: AIMS: Facial and Oral Movements Muscles of Facial Expression: None, normal Lips and Perioral Area: None, normal Jaw: None, normal Tongue: None, normal,Extremity Movements Upper (arms, wrists, hands, fingers): None, normal Lower (legs, knees, ankles, toes): None, normal, Trunk Movements Neck, shoulders, hips: None, normal, Overall Severity Severity of abnormal movements (highest score from questions above): None, normal Incapacitation due to abnormal movements: None, normal Patient's awareness of abnormal movements (rate only patient's report): No Awareness, Dental Status Current problems with teeth and/or dentures?: No Does patient usually wear dentures?: No  CIWA:    COWS:  COWS Total Score: 0  Musculoskeletal: Strength & Muscle Tone: within normal limits Gait & Station: normal Patient leans: N/A  Psychiatric Specialty Exam: Physical Exam  Nursing note  and vitals reviewed. Constitutional: He appears well-developed.  HENT:  Head: Normocephalic.  Eyes: Pupils are equal, round, and reactive to light.  Neck: Normal range of motion.  Cardiovascular: Normal rate.  Respiratory: Effort normal.  GI: Soft.  Genitourinary:    Genitourinary Comments: Deferred   Musculoskeletal: Normal range of motion.  Neurological: He is alert.  Skin: Skin is warm.    Review of Systems  Constitutional: Negative.   HENT: Negative.   Eyes: Negative.   Respiratory: Negative.   Cardiovascular: Negative.   Gastrointestinal: Negative.   Genitourinary: Negative.    Musculoskeletal: Negative.   Skin: Negative.   Neurological: Negative.   Endo/Heme/Allergies: Negative.   Psychiatric/Behavioral: Positive for depression (Stabilized with medication prior to discharge), hallucinations (Hx. Psychosis (Stabilized with medication prior to discharge)) and substance abuse (Hx. Amphetamine use disorder). Negative for memory loss and suicidal ideas. The patient has insomnia (Stabilized with medication prior to discharge)). The patient is not nervous/anxious.     Blood pressure 120/82, pulse 78, temperature 98 F (36.7 C), resp. rate 18, height 5\' 3"  (1.6 m), weight 78.9 kg, SpO2 100 %.Body mass index is 30.82 kg/m.  See Md's discharge SRA   Has this patient used any form of tobacco in the last 30 days? (Cigarettes, Smokeless Tobacco, Cigars, and/or Pipes):Yes, an FDA-approved tobacco cessation medication was offered at discharge.  Blood Alcohol level:  Lab Results  Component Value Date   ETH <10 07/29/2018   ETH <10 07/16/2018   Metabolic Disorder Labs:  Lab Results  Component Value Date   HGBA1C 5.7 (H) 12/19/2017   MPG 116.89 12/19/2017   MPG 128 04/02/2015   Lab Results  Component Value Date   PROLACTIN 45.8 (H) 12/19/2017   PROLACTIN 7.0 04/04/2015   Lab Results  Component Value Date   CHOL 182 12/19/2017   TRIG 204 (H) 12/19/2017   HDL 30 (L) 12/19/2017   CHOLHDL 6.1 12/19/2017   VLDL 41 (H) 12/19/2017   LDLCALC 111 (H) 12/19/2017   LDLCALC 96 04/04/2015   See Psychiatric Specialty Exam and Suicide Risk Assessment completed by Attending Physician prior to discharge.  Discharge destination:  Home  Is patient on multiple antipsychotic therapies at discharge:  No   Has Patient had three or more failed trials of antipsychotic monotherapy by history:  No  Recommended Plan for Multiple Antipsychotic Therapies: NA  Allergies as of 08/04/2018   No Known Allergies     Medication List    STOP taking these medications   amantadine 100 MG  capsule Commonly known as:  SYMMETREL   gabapentin 300 MG capsule Commonly known as:  NEURONTIN   nicotine 21 mg/24hr patch Commonly known as:  NICODERM CQ - dosed in mg/24 hours Replaced by:  nicotine 14 mg/24hr patch   paliperidone 234 MG/1.5ML Susy injection Commonly known as:  INVEGA SUSTENNA   paliperidone 6 MG 24 hr tablet Commonly known as:  INVEGA   Paliperidone ER injection Commonly known as:  INVEGA SUSTENNA     TAKE these medications     Indication  benztropine 0.5 MG tablet Commonly known as:  COGENTIN Take 1 tablet (0.5 mg total) by mouth 2 (two) times daily. For prevention of drug induced tremors  Indication:  Extrapyramidal Reaction caused by Medications   hydrOXYzine 50 MG tablet Commonly known as:  ATARAX/VISTARIL Take 1 tablet (50 mg total) by mouth 3 (three) times daily as needed for anxiety. What changed:    medication strength  how much to take  when to take this  Indication:  Feeling Anxious   nicotine 14 mg/24hr patch Commonly known as:  NICODERM CQ - dosed in mg/24 hours Place 1 patch (14 mg total) onto the skin daily. (May buy from over the counter): For smoking cessation Start taking on:  August 05, 2018 Replaces:  nicotine 21 mg/24hr patch  Indication:  Nicotine Addiction   risperidone 4 MG tablet Commonly known as:  RISPERDAL Take 1 tablet (4 mg total) by mouth at bedtime. For mood control  Indication:  Mood control   risperiDONE 3 MG tablet Commonly known as:  RISPERDAL Take 1 tablet (3 mg total) by mouth daily. For mood control Start taking on:  August 05, 2018  Indication:  Mood control   trazodone 300 MG tablet Commonly known as:  DESYREL Take 1 tablet (300 mg total) by mouth at bedtime. For sleep What changed:    medication strength  how much to take  when to take this  reasons to take this  additional instructions  Indication:  Trouble Sleeping      Follow-up Information    Services, [facility]  Follow up on 08/11/2018.   Why:  Please attend your screening for possible admission on 2/6 at 7:45a. Please bring your photo ID, proof of insurance, 30 day supply of medication and clothing.  Contact information: [address] [place] [address] [phone]          Follow-up recommendations: Activity:  As tolerated Diet: As recommended by your primary care doctor. Keep all scheduled follow-up appointments as recommended.  Comments: Patient is instructed prior to discharge to: Take all medications as prescribed by his/her mental healthcare provider. Report any adverse effects and or reactions from the medicines to his/her outpatient provider promptly. Patient has been instructed & cautioned: To not engage in alcohol and or illegal drug use while on prescription medicines. In the event of worsening symptoms, patient is instructed to call the crisis hotline, 911 and or go to the nearest ED for appropriate evaluation and treatment of symptoms. To follow-up with his/her primary care provider for your other medical issues, concerns and or health care needs.   Signed: Armandina Stammer, NP, PMHNP, FNP-BC 08/04/2018, 9:46 AM

## 2018-08-04 NOTE — Progress Notes (Signed)
Recreation Therapy Notes  INPATIENT RECREATION TR PLAN  Patient Details Name: [name] [name] MRN: [id number] DOB: 07-26-1980 Today's Date: 08/04/2018  Rec Therapy Plan Is patient appropriate for Therapeutic Recreation?: Yes Treatment times per week: about 3 days Estimated Length of Stay: 5-7 days TR Treatment/Interventions: Group participation (Comment)  Discharge Criteria Pt will be discharged from therapy if:: Discharged Treatment plan/goals/alternatives discussed and agreed upon by:: Patient/family  Discharge Summary Short term goals set: See patient care plan Short term goals met: Complete Progress toward goals comments: Groups attended Which groups?: Wellness, Coping skills, Goal setting Reason goals not met: None Therapeutic equipment acquired: N/A Reason patient discharged from therapy: Discharge from hospital Pt/family agrees with progress & goals achieved: Yes Date patient discharged from therapy: 08/04/18    Victorino Sparrow, LRT/CTRS  Ria Comment, Temprence Rhines A 08/04/2018, 11:40 AM

## 2018-08-04 NOTE — [facility] Suicide Risk Assessment (Signed)
[facility] Discharge Suicide Risk Assessment   Principal Problem: Methamphetamine-induced psychotic disorder [facility]) Discharge Diagnoses: Principal Problem:   Methamphetamine-induced psychotic disorder (HCC)   Total Time spent with patient: 30 minutes Patient is now alert oriented to person place time situation always eager for discharge however has to be served with a restraining order first he understands this and is still eager to go does not have thoughts of harming himself or others is once again cautioned avoid methamphetamines and other drugs of abuse but again no thoughts of harming self or others no acute dangerousness no acute impairment or withdrawal no psychosis at the present time no involuntary movements  Mental Status Per Nursing Assessment::   On Admission:  Suicidal ideation indicated by patient, Thoughts of violence towards others  Demographic Factors:  Male  Loss Factors: Legal issues  Historical Factors: Impulsivity  Risk Reduction Factors:   Religious beliefs about death  Continued Clinical Symptoms:  Alcohol/Substance Abuse/Dependencies  Cognitive Features That Contribute To Risk:  Thought constriction (tunnel vision)    Suicide Risk:  Minimal: No identifiable suicidal ideation.  Patients presenting with no risk factors but with morbid ruminations; may be classified as minimal risk based on the severity of the depressive symptoms  Follow-up Information    Services, [facility] Follow up on 08/11/2018.   Why:  Please attend your screening for possible admission on 2/6 at 7:45a. Please bring your photo ID, proof of insurance, 30 day supply of medication and clothing.  Contact information: [address] [place] [address] [phone]        [facility] Follow up.   Specialty:  Addiction Medicine Why:  Referral made 1/27. Contact information: [address] [place] [address] [phone]         Greatest  risk is of course dangerousness while impaired rather than volitional dangerousness  Plan Of Care/Follow-up recommendations:  Activity:  full  Raveena Hebdon, MD 08/04/2018, 9:14 AM

## 2018-08-04 NOTE — Plan of Care (Signed)
Pt was able to identify coping skills at completion of recreation therapy group sessions.   [name] [name], LRT/CTRS 

## 2018-08-04 NOTE — Progress Notes (Addendum)
CSW spoke to [facility]--long wait list, nothing will be available for between 5-10 days. Winferd Humphrey, MSW, LCSW Clinical Social Worker 08/04/2018 9:43 AM   CPS worker Rolinda Roan met with pt to discuss [facility] and CPS involvement. Winferd Humphrey, MSW, LCSW Clinical Social Worker 08/04/2018 10:06 AM

## 2018-08-04 NOTE — Progress Notes (Signed)
Recreation Therapy Notes  Date: 1.30.20 Time: 0945 Location: 500 Hall Dayroom  Group Topic: Wellness  Goal Area(s) Addresses:  Patient will define components of whole wellness. Patient will verbalize benefit of whole wellness.  Behavioral Response: Engaged  Intervention:  Music  Activity:  Exercise.  LRT and patients went through a series of stretches.  Patients were then allowed to lead the group in an exercise of their choice.  Patients were allowed to rest when needed and take water breaks as needed.  Education: Wellness, Building control surveyor.   Education Outcome: Acknowledges education/In group clarification offered/Needs additional education.   Clinical Observations/Feedback: Pt was active and completed the exercises.  Pt left for a few minutes but later returned and completed the activity.  Pt was smiling and social with peers during activity.   Caroll Rancher, LRT/CTRS      Lillia Abed, Adelei Scobey A 08/04/2018 11:10 AM

## 2018-08-17 DIAGNOSIS — F251 Schizoaffective disorder, depressive type: Secondary | ICD-10-CM | POA: Diagnosis not present

## 2018-08-17 DIAGNOSIS — F330 Major depressive disorder, recurrent, mild: Secondary | ICD-10-CM | POA: Diagnosis not present

## 2018-08-17 DIAGNOSIS — F17200 Nicotine dependence, unspecified, uncomplicated: Secondary | ICD-10-CM | POA: Diagnosis not present

## 2018-08-17 DIAGNOSIS — F209 Schizophrenia, unspecified: Secondary | ICD-10-CM | POA: Diagnosis not present

## 2018-10-30 ENCOUNTER — Other Ambulatory Visit: Payer: Self-pay

## 2018-10-30 ENCOUNTER — Observation Stay (HOSPITAL_COMMUNITY)
Admission: AD | Admit: 2018-10-30 | Discharge: 2018-10-31 | Disposition: A | Discharge status: Home / Self Care | Payer: Medicare Other | Attending: Psychiatry | Admitting: Psychiatry

## 2018-10-30 DIAGNOSIS — Z79899 Other long term (current) drug therapy: Secondary | ICD-10-CM | POA: Insufficient documentation

## 2018-10-30 DIAGNOSIS — F17210 Nicotine dependence, cigarettes, uncomplicated: Secondary | ICD-10-CM | POA: Diagnosis not present

## 2018-10-30 DIAGNOSIS — F15959 Other stimulant use, unspecified with stimulant-induced psychotic disorder, unspecified: Principal | ICD-10-CM

## 2018-10-30 DIAGNOSIS — F419 Anxiety disorder, unspecified: Secondary | ICD-10-CM | POA: Insufficient documentation

## 2018-10-30 DIAGNOSIS — F15250 Other stimulant dependence with stimulant-induced psychotic disorder with delusions: Secondary | ICD-10-CM | POA: Diagnosis present

## 2018-10-30 DIAGNOSIS — G4700 Insomnia, unspecified: Secondary | ICD-10-CM | POA: Diagnosis not present

## 2018-10-30 DIAGNOSIS — R45851 Suicidal ideations: Secondary | ICD-10-CM | POA: Insufficient documentation

## 2018-10-30 DIAGNOSIS — F250 Schizoaffective disorder, bipolar type: Secondary | ICD-10-CM | POA: Diagnosis not present

## 2018-10-30 DIAGNOSIS — F209 Schizophrenia, unspecified: Secondary | ICD-10-CM | POA: Diagnosis present

## 2018-10-30 DIAGNOSIS — F200 Paranoid schizophrenia: Secondary | ICD-10-CM

## 2018-10-30 LAB — LIPID PANEL
Cholesterol: 139 mg/dL (ref 0–200)
HDL: 41 mg/dL (ref >40)
LDL Cholesterol: 83 mg/dL (ref 0–99)
Total CHOL/HDL Ratio: 3.4 RATIO
Triglycerides: 77 mg/dL (ref <150)
VLDL: 15 mg/dL (ref 0–40)

## 2018-10-30 LAB — CBC
HCT: 47.2 % (ref 39.0–52.0)
Hemoglobin: 14.7 g/dL (ref 13.0–17.0)
MCH: 25.6 pg — ABNORMAL LOW (ref 26.0–34.0)
MCHC: 31.1 g/dL (ref 30.0–36.0)
MCV: 82.2 fL (ref 80.0–100.0)
Platelets: 277 10*3/uL (ref 150–400)
RBC: 5.74 MIL/uL (ref 4.22–5.81)
RDW: 13.2 % (ref 11.5–15.5)
WBC: 5.5 10*3/uL (ref 4.0–10.5)
nRBC: 0 % (ref 0.0–0.2)

## 2018-10-30 LAB — COMPREHENSIVE METABOLIC PANEL
ALT: 33 U/L (ref 0–44)
AST: 20 U/L (ref 15–41)
Albumin: 4.6 g/dL (ref 3.5–5.0)
Alkaline Phosphatase: 70 U/L (ref 38–126)
Anion gap: 8 (ref 5–15)
BUN: 7 mg/dL (ref 6–20)
CO2: 26 mmol/L (ref 22–32)
Calcium: 9.5 mg/dL (ref 8.9–10.3)
Chloride: 106 mmol/L (ref 98–111)
Creatinine, Ser: 0.99 mg/dL (ref 0.61–1.24)
GFR calc Af Amer: >60 mL/min (ref >60)
GFR calc non Af Amer: >60 mL/min (ref >60)
Glucose, Bld: 104 mg/dL — ABNORMAL HIGH (ref 70–99)
Potassium: 3.9 mmol/L (ref 3.5–5.1)
Sodium: 140 mmol/L (ref 135–145)
Total Bilirubin: 0.5 mg/dL (ref 0.3–1.2)
Total Protein: 8 g/dL (ref 6.5–8.1)

## 2018-10-30 LAB — HEMOGLOBIN A1C
Hgb A1c MFr Bld: 5.3 % (ref 4.8–5.6)
Mean Plasma Glucose: 105.41 mg/dL

## 2018-10-30 LAB — RAPID URINE DRUG SCREEN, HOSP PERFORMED
Amphetamines: POSITIVE — AB
Barbiturates: NOT DETECTED
Benzodiazepines: NOT DETECTED
Cocaine: NOT DETECTED
Opiates: NOT DETECTED
Tetrahydrocannabinol: NOT DETECTED

## 2018-10-30 LAB — TSH: TSH: 0.843 u[IU]/mL (ref 0.350–4.500)

## 2018-10-30 MED ORDER — RISPERIDONE 2 MG PO TABS
4.0000 mg | ORAL_TABLET | Freq: Every day | ORAL | Status: DC
  Administered 2018-10-30: 4 mg via ORAL

## 2018-10-30 MED ORDER — RISPERIDONE 3 MG PO TABS
ORAL_TABLET | ORAL | Status: AC
  Filled 2018-10-30: qty 1

## 2018-10-30 MED ORDER — BENZTROPINE MESYLATE 0.5 MG PO TABS
0.5000 mg | ORAL_TABLET | Freq: Two times a day (BID) | ORAL | Status: DC
  Administered 2018-10-30 – 2018-10-31 (×3): 0.5 mg via ORAL
  Filled 2018-10-30 (×3): qty 1

## 2018-10-30 MED ORDER — ZIPRASIDONE MESYLATE 20 MG IM SOLR: 20.0000 mg | Freq: Two times a day (BID) | INTRAMUSCULAR | Status: DC | PRN

## 2018-10-30 MED ORDER — CARBAMAZEPINE ER 200 MG PO CP12
200.0000 mg | ORAL_CAPSULE | Freq: Two times a day (BID) | ORAL | Status: DC
  Administered 2018-10-30 – 2018-10-31 (×3): 200 mg via ORAL
  Filled 2018-10-30 (×5): qty 1

## 2018-10-30 MED ORDER — ACETAMINOPHEN 325 MG PO TABS: 650.0000 mg | ORAL_TABLET | Freq: Four times a day (QID) | ORAL | Status: DC | PRN

## 2018-10-30 MED ORDER — RISPERIDONE 2 MG PO TABS
ORAL_TABLET | ORAL | Status: AC
  Filled 2018-10-30: qty 2

## 2018-10-30 MED ORDER — TRAZODONE HCL 100 MG PO TABS: 300.0000 mg | ORAL_TABLET | Freq: Every day | ORAL | Status: DC

## 2018-10-30 MED ORDER — HYDROXYZINE HCL 50 MG PO TABS
50.0000 mg | ORAL_TABLET | Freq: Three times a day (TID) | ORAL | Status: DC | PRN
  Administered 2018-10-30: 50 mg via ORAL

## 2018-10-30 MED ORDER — TRAZODONE HCL 100 MG PO TABS
ORAL_TABLET | ORAL | Status: AC
  Filled 2018-10-30: qty 3

## 2018-10-30 MED ORDER — ALUM & MAG HYDROXIDE-SIMETH 200-200-20 MG/5ML PO SUSP: 30.0000 mL | ORAL | Status: DC | PRN

## 2018-10-30 MED ORDER — LORAZEPAM 1 MG PO TABS: 1.0000 mg | ORAL_TABLET | ORAL | Status: DC | PRN

## 2018-10-30 MED ORDER — MAGNESIUM HYDROXIDE 400 MG/5ML PO SUSP: 30.0000 mL | Freq: Every day | ORAL | Status: DC | PRN

## 2018-10-30 MED ORDER — RISPERIDONE 3 MG PO TABS
3.0000 mg | ORAL_TABLET | Freq: Every day | ORAL | Status: DC
  Administered 2018-10-30 – 2018-10-31 (×2): 3 mg via ORAL
  Filled 2018-10-30: qty 1

## 2018-10-30 MED ORDER — HYDROXYZINE HCL 50 MG PO TABS
ORAL_TABLET | ORAL | Status: AC
  Filled 2018-10-30: qty 1

## 2018-10-30 NOTE — BH Assessment (Signed)
Assessment Note  [name] [name] is an 38 y.o. married male who [facility] after Pt's sister, Marcine MatarHsara [name] [phone], petitioned for involuntary commitment. Affidavit and petition states: "[name] stated he wanted to kill himself. He stopped taking his meds. He has made threats to family members. He grabs knives and other things throwing them through the house."  Pt has a history of schizophrenia and is a poor historian to tangential thought process. He says he is here because his family "wants me to take my medication and shower." He says he feels "angry, upset, sad." Pt repeatedly says he shouldn't be at the hospital and should be with his family. He acknowledges he has had suicidal thoughts but says he wants to live. He acknowledges he has been brandishing knives because he believes people are trying to get into his house and harm him. He says he is "scared" of people and that he has to protect himself. When asked if he is experiencing hallucinations he says "I don't know." Pt initially says he takes his medication every morning and evening but then says he forgets. He says he has used drugs recently but doesn't know what the drug was.  Pt reports that he lives with his wife. He says he has a one-year-old daughter and another child that died. Pt says he tries to work but doesn't have a job. He says he thinks he has a court date pending but isn't certain and doesn't know what it is about. Pt says that people steal from him and that he gambles and loses money. Pt says [facility] but cannot remember when.   Pt is casually dressed in shorts and t-shirt. He is alert and oriented x4. Pt speaks in a soft tone, at moderate volume and normal pace. Motor behavior appears normal. Eye contact is good and Pt frequently blinks. Pt's mood is angry, anxious and sad and affect is  anxious. Thought process is tangential and often disorganized. Pt says he does not want to be hospitalized and wants to return home.    Diagnosis: F20.9 Schizophrenia  Past Medical History:  Past Medical History:  Diagnosis Date  . Anxiety   . Depression   . Mental disorder   . Schizophrenia (HCC)     No past surgical history on file.  Family History:  Family History  Problem Relation Age of Onset  . Mental illness Neg Hx     Social History:  reports that he has been smoking cigarettes. He has been smoking about 1.00 pack per day. He has quit using smokeless tobacco. He reports previous alcohol use. He reports current drug use. Drugs: Marijuana and Methamphetamines.  Additional Social History:  Alcohol / Drug Use Pain Medications: See MAR Prescriptions: See MAR Over the Counter: See MAR History of alcohol / drug use?: Yes(Pt reports he took a substance but doesn't know what it was) Longest period of sobriety (when/how long): UTA  CIWA:   COWS:    Allergies: No Known Allergies  Home Medications: (Not in a hospital admission)   OB/GYN Status:  No LMP for male patient.  General Assessment Data [facility] TTS Assessment: In system Is this a Tele or Face-to-Face Assessment?: Face-to-Face Is this an Initial Assessment or a Re-assessment for this encounter?: Initial Assessment Patient Accompanied by:: N/A Language Other than English: No Living Arrangements: Other (  Comment)(Lives with wife) What gender do you identify as?: Male Marital status: Married [name] [name] name: NA Pregnancy Status: No Living Arrangements: Spouse/significant other, Children Can pt return to current living arrangement?: Yes Admission Status: Involuntary Petitioner: Family member Is patient capable of signing voluntary admission?: Yes Referral Source: Self/Family/Friend Insurance type: Medicare, Medicaid  Medical Screening Exam ([facility] Walk-in ONLY) Medical Exam  completed: Yes(Jason Allyson Sabal, FNP)  Crisis Care Plan Living Arrangements: Spouse/significant other, Children Legal Guardian: Other:(Self) Name of Psychiatrist: [facility] Name of Therapist: None  Education Status Is patient currently in school?: No Is the patient employed, unemployed or receiving disability?: Unemployed  Risk to self with the past 6 months Suicidal Ideation: Yes-Currently Present Has patient been a risk to self within the past 6 months prior to admission? : Yes Suicidal Intent: No Has patient had any suicidal intent within the past 6 months prior to admission? : No Is patient at risk for suicide?: Yes Suicidal Plan?: No Has patient had any suicidal plan within the past 6 months prior to admission? : No Access to Means: Yes Specify Access to Suicidal Means: Access to knives What has been your use of drugs/alcohol within the last 12 months?: Pt has history of substance use Previous Attempts/Gestures: No How many times?: 0 Other Self Harm Risks: None identified Triggers for Past Attempts: None known Intentional Self Injurious Behavior: None Family Suicide History: Unknown Recent stressful life event(s): Financial Problems Persecutory voices/beliefs?: Yes Depression: Yes Depression Symptoms: Despondent, Insomnia, Tearfulness, Isolating, Fatigue, Guilt, Loss of interest in usual pleasures, Feeling worthless/self pity, Feeling angry/irritable Substance abuse history and/or treatment for substance abuse?: Yes Suicide prevention information given to non-admitted patients: Not applicable  Risk to Others within the past 6 months Homicidal Ideation: No Does patient have any lifetime risk of violence toward others beyond the six months prior to admission? : Yes (comment) Thoughts of Harm to Others: No Current Homicidal Intent: No Current Homicidal Plan: No Access to Homicidal Means: Yes Describe Access to Homicidal Means: Knives Identified Victim: Pt made verbal threats to  family members History of harm to others?: No Assessment of Violence: On admission Violent Behavior Description: Throwing knives, verbal threats Does patient have access to weapons?: Yes (Comment)(Access to knives) Criminal Charges Pending?: No Does patient have a court date: No Is patient on probation?: No  Psychosis Hallucinations: None noted Delusions: Persecutory  Mental Status Report Appearance/Hygiene: Other (Comment)(Casually dressed) Eye Contact: Good Motor Activity: Other (Comment)(Frequent blinking) Speech: Soft Level of Consciousness: Alert Mood: Depressed, Anxious, Fearful, Angry Affect: Anxious Anxiety Level: Moderate Thought Processes: Tangential Judgement: Impaired Orientation: Person, Place, Time, Situation Obsessive Compulsive Thoughts/Behaviors: None  Cognitive Functioning Concentration: Decreased Memory: Recent Intact, Remote Intact Is patient IDD: No Insight: Poor Impulse Control: Poor Appetite: Fair Have you had any weight changes? : No Change Sleep: No Change Total Hours of Sleep: 7 Vegetative Symptoms: None  ADLScreening [name] Assessment Services) Patient's cognitive ability adequate to safely complete daily activities?: Yes Patient able to express need for assistance with ADLs?: Yes Independently performs ADLs?: Yes (appropriate for developmental age)  Prior Inpatient Therapy Prior Inpatient Therapy: Yes Prior Therapy Dates: 07/2018, multiple admits Prior Therapy Facilty/Provider(s): [facility] Reason for Treatment: Schizophrenia  Prior Outpatient Therapy Prior Outpatient Therapy: Yes Prior Therapy Dates: Current Prior Therapy Facilty/Provider(s): [facility] Reason for Treatment: Schizophrenia Does patient have an ACCT team?: No Does patient have Intensive In-House Services?  : No Does patient have [facility] services? : Yes Does patient have P4CC services?: No  ADL Screening (condition  at time of admission) Patient's cognitive ability  adequate to safely complete daily activities?: Yes Is the patient deaf or have difficulty hearing?: No Does the patient have difficulty seeing, even when wearing glasses/contacts?: No Does the patient have difficulty concentrating, remembering, or making decisions?: No Patient able to express need for assistance with ADLs?: Yes Does the patient have difficulty dressing or bathing?: No Independently performs ADLs?: Yes (appropriate for developmental age) Does the patient have difficulty walking or climbing stairs?: No Weakness of Legs: None Weakness of Arms/Hands: None  Home Assistive Devices/Equipment Home Assistive Devices/Equipment: None    Abuse/Neglect Assessment (Assessment to be complete while patient is alone) Abuse/Neglect Assessment Can Be Completed: Yes Physical Abuse: Denies Verbal Abuse: Denies Sexual Abuse: Denies Exploitation of patient/patient's resources: Denies Self-Neglect: Denies     Merchant navy officer (For Healthcare) Does Patient Have a Medical Advance Directive?: No Would patient like information on creating a medical advance directive?: No - Patient declined          Disposition: Gave clinical report to Nira Conn, FNP who completed MSE and determined Pt meets criteria for inpatient psychiatric treatment. Binnie Rail, [facility] said adult unit is at capacity and Pt will be admitted to observation unit.  Disposition Initial Assessment Completed for this Encounter: Yes Disposition of Patient: Admit Type of inpatient treatment program: Adult  On Site Evaluation by:  Nira Conn, FNP Reviewed with Physician:    Pamalee Leyden, [facility], [facility], [facility] Triage Specialist [phone]  Patsy Baltimore, Harlin Rain 10/30/2018 2:58 AM

## 2018-10-30 NOTE — H&P (Addendum)
Behavioral Health Medical Screening Exam  [name] [name] is an 38 y.o. male.  Total Time spent with patient: 30 minutes   Psychiatric Specialty Exam: Physical Exam  Constitutional: He is oriented to person, place, and time. He appears well-developed and well-nourished. No distress.  HENT:  Head: Normocephalic and atraumatic.  Right Ear: External ear normal.  Left Ear: External ear normal.  Eyes: Pupils are equal, round, and reactive to light. Conjunctivae are normal. Right eye exhibits no discharge. Left eye exhibits no discharge.  Respiratory: Effort normal. No respiratory distress.  Musculoskeletal: Normal range of motion.  Neurological: He is alert and oriented to person, place, and time.  Skin: He is not diaphoretic.  Psychiatric: His mood appears anxious. Thought content is paranoid and delusional. He expresses impulsivity and inappropriate judgment. He exhibits a depressed mood. He expresses no homicidal and no suicidal ideation.    Review of Systems  Constitutional: Negative for chills, diaphoresis, fever, malaise/fatigue and weight loss.  Respiratory: Negative for cough and shortness of breath.   Cardiovascular: Negative for chest pain.  Gastrointestinal: Negative for diarrhea, nausea and vomiting.  Psychiatric/Behavioral: Positive for depression, substance abuse and suicidal ideas. Negative for hallucinations and memory loss. The patient is nervous/anxious and has insomnia.     Blood pressure (!) 145/109, pulse 93, temperature (!) 97.4 F (36.3 C), temperature source Oral, resp. rate 18, SpO2 99 %.There is no height or weight on file to calculate BMI.  General Appearance: Casual and Well Groomed  Eye Contact:  Fair  Speech:  Clear and Coherent  Volume:  Normal  Mood:  Anxious and Depressed  Affect:  Blunt and Congruent  Thought Process:  Disorganized  Orientation:  Full (Time, Place, and Person)  Thought Content:  Paranoid Ideation and Tangential  Suicidal Thoughts:  No   Homicidal Thoughts:  No  Memory:  Immediate;   Fair Recent;   Poor  Judgement:  Impaired  Insight:  Lacking  Psychomotor Activity:  Increased  Concentration:  Concentration: Poor and Attention Span: Poor  Recall:  [employer] of Knowledge:  Fair  Language:  Fair  Akathisia:  Negative  Handed:  Right  AIMS (if indicated):     Assets:  Desire for Improvement Financial Resources/Insurance Housing Intimacy Leisure Time Physical Health  ADL's:  Intact  Cognition:  WNL  Sleep:        Musculoskeletal: Strength & Muscle Tone: within normal limits Gait & Station: normal Patient leans: Front  Blood pressure (!) 145/109, pulse 93, temperature (!) 97.4 F (36.3 C), temperature source Oral, resp. rate 18, SpO2 99 %.  Recommendations:  Based on my evaluation the patient does not appear to have an emergency medical condition.   Treatment Plan Summary: Admits to observation. Daily contact with patient to assess and evaluate symptoms and progress in treatment, Medication management and Plan inpatient treatment  Jackelyn Poling, NP 10/30/2018, 4:27 AM

## 2018-10-30 NOTE — Progress Notes (Signed)
Nursing Note : Pt was woken up for dinner and medications, " I can't call home it will bring up all my anger and I can't do that to my family again" Pt went back to sleep.

## 2018-10-30 NOTE — Progress Notes (Signed)
Nursing Note : Pt was slightly confused this am but remebered he was at [facility] . Pt did denied that he threaten his family or to stab him self with knives. " I don't remember, I want to go home I'll be fine." Pt's UDS was positive for  Amphetamines, pt didn't denied using. Pt refused breakfast and diner but did take carbamazepine and cogentin. Pt has slept most of the day only getting up for the bathroom. Maintained on q 15 minute checks

## 2018-10-30 NOTE — Progress Notes (Signed)
[facility] NOVEL CORONAVIRUS (COVID-19) DAILY CHECK-OFF SYMPTOMS - answer yes or no to each - every day NO YES  Have you had a fever in the past 24 hours?  . Fever (Temp > 37.80C / 100F) X   Have you had any of these symptoms in the past 24 hours? . New Cough .  Sore Throat  .  Shortness of Breath .  Difficulty Breathing .  Unexplained Body Aches   X   Have you had any one of these symptoms in the past 24 hours not related to allergies?   . Runny Nose .  Nasal Congestion .  Sneezing   X   If you have had runny nose, nasal congestion, sneezing in the past 24 hours, has it worsened?  X   EXPOSURES - check yes or no X   Have you traveled outside the state in the past 14 days?  X   Have you been in contact with someone with a confirmed diagnosis of COVID-19 or PUI in the past 14 days without wearing appropriate PPE?  X   Have you been living in the same home as a person with confirmed diagnosis of COVID-19 or a PUI (household contact)?    X   Have you been diagnosed with COVID-19?    X              What to do next: Answered NO to all: Answered YES to anything:   Proceed with unit schedule Follow the [facility] Inpatient Flowsheet.   

## 2018-10-30 NOTE — Progress Notes (Signed)
Admitted this 38 y/o male patient to Observation unit with  Dx Schizophrenia. He arrives by [facility] IVC. IVC paperwork indicates patient wanted to kill himself,stopped taking his medications,has made threats to family members,and grabs knives and other things throwing them through the house. He is paranoid and delusional on admission. He asked if thermometer was" the mark of the beast." He reports concern over his daughter who he reports is staying with her GF but does not trust [place] to tell us why.Patient talked about being sad and says people are stealing from him. He says his family lies on him and calls the police. Twain wants to go home and says,"don't need to be here." "Want to be with my family." He does not report any S.I. or H.I. Patient is disorganized and tangential. Admit to Observation unit,medications as ordered, and given,monitor q 15 minutes for safety.

## 2018-10-30 NOTE — Plan of Care (Signed)
[facility] Observation Crisis Plan  Reason for Crisis Plan:  Chronic Mental Illness/Medical Illness   Plan of Care:  Referral for Inpatient Hospitalization  Family Support:      Current Living Environment:  Living Arrangements: Spouse/significant other  Insurance:   Hospital Account    Name Acct ID Class Status Primary Coverage   Zayen, Mcwilliams [id number] BEHAVIORAL HEALTH OBSERVATION Open MEDICARE - MEDICARE PART A AND B        Guarantor Account (for Hospital Account [id number])    Name Relation to Pt Service Area Active? Acct Type   [name] Self [facility]   Address Phone       [address] [place], [place] [address] [phone])          Coverage Information (for Hospital Account [id number])    1. MEDICARE/MEDICARE PART A AND B    F/O Payor/Plan Precert #   MEDICARE/MEDICARE PART A AND B    Subscriber Subscriber #   Sair, Karau [id number]   Address Phone   [address] [place], [place] [address]        2. [place]    F/O Payor/Plan Precert #   [facility] MEDICAID/[place]    Subscriber Subscriber #   Rayn, Amacker [id number]   Address Phone   [address] [place], [place] [address] [phone]          Legal Guardian:  Legal Guardian: Other:(Self)  Primary Care Provider:  Patient, No Pcp Per  Current Outpatient Providers:  [facility]  Psychiatrist:  Name of Psychiatrist: [facility]  Counselor/Therapist:  Name of Therapist: None  Compliant with Medications:  No  Additional Information:   Lawrence Santiago 4/26/20203:55 AM

## 2018-10-30 NOTE — Progress Notes (Addendum)
[facility] MD Progress Note  10/30/2018 10:13 AM [name] [name]  MRN:  [id number]   Subjective:  Pt was seen and chart reviewed with treatment team and Dr Jannifer Franklin.  Pt denies suicidal/homicidal ideation, denies auditory/visual hallucinations and does not appear to be responding to internal stimuli. Pt is known to this hospital and has been admitted 2 times in the past 6 months for using amphetamines and not taking his antipsychotic medications. Pt has a history of schizoaffective disorder, bipolar type and becomes manic when off his medications and abusing methamphetamines. He was threatening his family with knives and threatening to kill himself. Pt's UDS positive for amphetamines, BAL negative. His sister was concerned for his safety and the safety of the family so she placed him under IVC. He has been calm and cooperative since being at [facility] in the OBS unit. He is resting in his room this morning and stated he remembers threatening people at home but he does not know what drugs he was using. Pt will require inpatient psychiatric hospitalization for further stabilization and medication management. He will remain in the OBS unit, under IVC, until a bed can be located for him. Home medications were restarted.   Principal Problem: Schizoaffective disorder, bipolar type (HCC) Diagnosis: Principal Problem:   Schizoaffective disorder, bipolar type (HCC) Active Problems:   Schizophrenia (HCC)  Total Time spent with patient: 30 minutes  Past Psychiatric History: As above  Past Medical History:  Past Medical History:  Diagnosis Date  . Anxiety   . Depression   . Mental disorder   . Schizophrenia (HCC)    No past surgical history on file. Family History:  Family History  Problem Relation Age of Onset  . Mental illness Neg Hx    Family Psychiatric  History: Pt did not give this information Social History:  Social History   Substance and Sexual Activity  Alcohol Use Not Currently   Comment: haven't  drank in months"     Social History   Substance and Sexual Activity  Drug Use Yes  . Types: Marijuana, Methamphetamines   Comment: Last used: 2 weeks ago    Social History   Socioeconomic History  . Marital status: Single    Spouse name: Not on file  . Number of children: Not on file  . Years of education: Not on file  . Highest education level: Not on file  Occupational History  . Not on file  Social Needs  . Financial resource strain: Not on file  . Food insecurity:    Worry: Not on file    Inability: Not on file  . Transportation needs:    Medical: Not on file    Non-medical: Not on file  Tobacco Use  . Smoking status: Current Every Day Smoker    Packs/day: 1.00    Types: Cigarettes  . Smokeless tobacco: Former Engineer, water and Sexual Activity  . Alcohol use: Not Currently    Comment: haven't drank in months"  . Drug use: Yes    Types: Marijuana, Methamphetamines    Comment: Last used: 2 weeks ago  . Sexual activity: Yes    Birth control/protection: None  Lifestyle  . Physical activity:    Days per week: Not on file    Minutes per session: Not on file  . Stress: Not on file  Relationships  . Social connections:    Talks on phone: Not on file    Gets together: Not on file    Attends religious service:  Not on file    Active member of club or organization: Not on file    Attends meetings of clubs or organizations: Not on file    Relationship status: Not on file  Other Topics Concern  . Not on file  Social History Narrative  . Not on file   Additional Social History:    Pain Medications: See MAR Prescriptions: See MAR Over the Counter: See MAR History of alcohol / drug use?: Yes(Pt reports he took a substance but doesn't know what it was) Longest period of sobriety (when/how long): UTA   Sleep: Fair  Appetite:  Fair  Current Medications: Current Facility-Administered Medications  Medication Dose Route Frequency Provider Last Rate Last Dose  .  acetaminophen (TYLENOL) tablet 650 mg  650 mg Oral Q6H PRN Jackelyn PolingBerry, Jason A, NP      . alum & mag hydroxide-simeth (MAALOX/MYLANTA) 200-200-20 MG/5ML suspension 30 mL  30 mL Oral Q4H PRN Nira ConnBerry, Jason A, NP      . benztropine (COGENTIN) tablet 0.5 mg  0.5 mg Oral BID Nira ConnBerry, Jason A, NP      . carbamazepine (EQUETRO) 12 hr capsule 200 mg  200 mg Oral BID Nattaly Yebra, MD      . hydrOXYzine (ATARAX/VISTARIL) tablet 50 mg  50 mg Oral TID PRN Nira ConnBerry, Jason A, NP   50 mg at 10/30/18 0345  . magnesium hydroxide (MILK OF MAGNESIA) suspension 30 mL  30 mL Oral Daily PRN Nira ConnBerry, Jason A, NP      . risperiDONE (RISPERDAL) tablet 3 mg  3 mg Oral Daily Nira ConnBerry, Jason A, NP   3 mg at 10/30/18 0344  . risperiDONE (RISPERDAL) tablet 4 mg  4 mg Oral QHS Nira ConnBerry, Jason A, NP      . traZODone (DESYREL) tablet 300 mg  300 mg Oral QHS Jackelyn PolingBerry, Jason A, NP        Lab Results:  Results for orders placed or performed during the hospital encounter of 10/30/18 (from the past 48 hour(s))  CBC     Status: Abnormal   Collection Time: 10/30/18  6:35 AM  Result Value Ref Range   WBC 5.5 4.0 - 10.5 K/uL   RBC 5.74 4.22 - 5.81 MIL/uL   Hemoglobin 14.7 13.0 - 17.0 g/dL   HCT 40.947.2 81.139.0 - 91.452.0 %   MCV 82.2 80.0 - 100.0 fL   MCH 25.6 (L) 26.0 - 34.0 pg   MCHC 31.1 30.0 - 36.0 g/dL   RDW 78.213.2 95.611.5 - 21.315.5 %   Platelets 277 150 - 400 K/uL   nRBC 0.0 0.0 - 0.2 %    Comment: Performed at [facility], [address] [place], [place] [address]  Comprehensive metabolic panel     Status: Abnormal   Collection Time: 10/30/18  6:35 AM  Result Value Ref Range   Sodium 140 135 - 145 mmol/L   Potassium 3.9 3.5 - 5.1 mmol/L   Chloride 106 98 - 111 mmol/L   CO2 26 22 - 32 mmol/L   Glucose, Bld 104 (H) 70 - 99 mg/dL   BUN 7 6 - 20 mg/dL   Creatinine, Ser 8.460.99 0.61 - 1.24 mg/dL   Calcium 9.5 8.9 - 96.210.3 mg/dL   Total Protein 8.0 6.5 - 8.1 g/dL   Albumin 4.6 3.5 - 5.0 g/dL   AST 20 15 - 41 U/L   ALT 33 0 - 44 U/L    Alkaline Phosphatase 70 38 - 126 U/L   Total  Bilirubin 0.5 0.3 - 1.2 mg/dL   GFR calc non Af Amer >60 >60 mL/min   GFR calc Af Amer >60 >60 mL/min   Anion gap 8 5 - 15    Comment: Performed at [facility], [address], [place], [place] [address]  Lipid panel     Status: None   Collection Time: 10/30/18  6:35 AM  Result Value Ref Range   Cholesterol 139 0 - 200 mg/dL   Triglycerides 77 <197 mg/dL   HDL 41 >58 mg/dL   Total CHOL/HDL Ratio 3.4 RATIO   VLDL 15 0 - 40 mg/dL   LDL Cholesterol 83 0 - 99 mg/dL    Comment:        Total Cholesterol/HDL:CHD Risk Coronary Heart Disease Risk Table                     Men   Women  1/2 Average Risk   3.4   3.3  Average Risk       5.0   4.4  2 X Average Risk   9.6   7.1  3 X Average Risk  23.4   11.0        Use the calculated Patient Ratio above and the CHD Risk Table to determine the patient's CHD Risk.        ATP III CLASSIFICATION (LDL):  <100     mg/dL   Optimal  832-549  mg/dL   Near or Above                    Optimal  130-159  mg/dL   Borderline  826-415  mg/dL   High  >830     mg/dL   Very High Performed at [facility], [address] [place], [place] [address]   TSH     Status: None   Collection Time: 10/30/18  6:35 AM  Result Value Ref Range   TSH 0.843 0.350 - 4.500 uIU/mL    Comment: Performed by a 3rd Generation assay with a functional sensitivity of <=0.01 uIU/mL. Performed at [facility], [address], [place], [place] [address]   Urine rapid drug screen (hosp performed)not at [facility]     Status: Abnormal   Collection Time: 10/30/18  6:40 AM  Result Value Ref Range   Opiates NONE DETECTED NONE DETECTED   Cocaine NONE DETECTED NONE DETECTED   Benzodiazepines NONE DETECTED NONE DETECTED   Amphetamines POSITIVE (A) NONE DETECTED   Tetrahydrocannabinol NONE DETECTED NONE DETECTED   Barbiturates NONE DETECTED NONE DETECTED    Comment: (NOTE) DRUG SCREEN FOR  MEDICAL PURPOSES ONLY.  IF CONFIRMATION IS NEEDED FOR ANY PURPOSE, NOTIFY LAB WITHIN 5 DAYS. LOWEST DETECTABLE LIMITS FOR URINE DRUG SCREEN Drug Class                     Cutoff (ng/mL) Amphetamine and metabolites    1000 Barbiturate and metabolites    200 Benzodiazepine                 200 Tricyclics and metabolites     300 Opiates and metabolites        300 Cocaine and metabolites        300 THC                            50 Performed at [facility], [address] [place], [place] [address]     Blood Alcohol level:  Lab Results  Component Value Date   ETH <10 07/29/2018   ETH <10 07/16/2018    Metabolic Disorder Labs: Lab Results  Component Value Date   HGBA1C 5.7 (H) 12/19/2017   MPG 116.89 12/19/2017   MPG 128 04/02/2015   Lab Results  Component Value Date   PROLACTIN 45.8 (H) 12/19/2017   PROLACTIN 7.0 04/04/2015   Lab Results  Component Value Date   CHOL 139 10/30/2018   TRIG 77 10/30/2018   HDL 41 10/30/2018   CHOLHDL 3.4 10/30/2018   VLDL 15 10/30/2018   LDLCALC 83 10/30/2018   LDLCALC 111 (H) 12/19/2017    Physical Findings: AIMS: Facial and Oral Movements Muscles of Facial Expression: None, normal Lips and Perioral Area: None, normal Jaw: None, normal Tongue: None, normal,Extremity Movements Upper (arms, wrists, hands, fingers): None, normal Lower (legs, knees, ankles, toes): None, normal, Trunk Movements Neck, shoulders, hips: None, normal, Overall Severity Severity of abnormal movements (highest score from questions above): None, normal Incapacitation due to abnormal movements: None, normal Patient's awareness of abnormal movements (rate only patient's report): No Awareness, Dental Status Current problems with teeth and/or dentures?: No Does patient usually wear dentures?: No  CIWA:  CIWA-Ar Total: 7 COWS:  COWS Total Score: 4  Musculoskeletal: Strength & Muscle Tone: within normal limits Gait & Station:  normal Patient leans: N/A  Psychiatric Specialty Exam: Physical Exam  Constitutional: He is oriented to person, place, and time. He appears well-developed and well-nourished.  HENT:  Head: Normocephalic.  Respiratory: Effort normal.  Musculoskeletal: Normal range of motion.  Neurological: He is alert and oriented to person, place, and time.  Psychiatric: His speech is normal and behavior is normal. His mood appears anxious. Thought content is paranoid. Cognition and memory are impaired. He expresses impulsivity. He exhibits a depressed mood.    Review of Systems  Psychiatric/Behavioral: Positive for depression and substance abuse. The patient is nervous/anxious.   All other systems reviewed and are negative.   Blood pressure (!) 145/109, pulse 93, temperature (!) 97.4 F (36.3 C), temperature source Oral, resp. rate 18, SpO2 99 %.There is no height or weight on file to calculate BMI.  General Appearance: Casual  Eye Contact:  Fair  Speech:  Clear and Coherent  Volume:  Normal  Mood:  Anxious and Depressed  Affect:  Congruent  Thought Process:  Disorganized  Orientation:  Full (Time, Place, and Person)  Thought Content:  Ideas of Reference:   Paranoia and Tangential  Suicidal Thoughts:  No  Homicidal Thoughts:  No  Memory:  Immediate;   Fair Recent;   Fair  Judgement:  Impaired  Insight:  Shallow  Psychomotor Activity:  Normal  Concentration:  Concentration: Fair and Attention Span: Fair  Recall:  [employer] of Knowledge:  Fair  Language:  Good  Akathisia:  Negative  Handed:  Right  AIMS (if indicated):     Assets:  Architect Housing Physical Health Resilience Social Support  ADL's:  Intact  Cognition:  WNL  Sleep:   Fair     Treatment Plan Summary: Daily contact with patient to assess and evaluate symptoms and progress in treatment and Medication management    Home medications restarted on admission Risperdal 3 mg daily  for mood stability Risperdal 4 mg QHS for mood stability Trazodone 300 mg QHS for sleep Vistaril 50 mg TID prn anxiety Cogentin 0.5 mg BID EPS  prevention  Labs reviewed:  CBC WNL CMET WNL HbA1C not resulted yet UDS positive for amphetamines  Lipids WNL  TSH WNL 0.843  Admitted to OBS unit for stabilization, will seek inpatient bed for further stabilization and medication management   Laveda Abbe, NP 10/30/2018, 10:13 AM  Patient seen face-to-face for psychiatric evaluation, chart reviewed and case discussed with the physician extender and developed treatment plan. Reviewed the information documented and agree with the treatment plan. Thedore Mins, MD

## 2018-10-30 NOTE — H&P (Signed)
BH Observation Unit Provider Admission PAA/H&P  Patient Identification: [name] ItoSimon [name] MRN:  [id number] Date of Evaluation:  10/30/2018 Chief Complaint:  Schizophrenia Principal Diagnosis: Schizophrenia (HCC) Diagnosis:  Active Problems:   Schizophrenia (HCC)  History of Present Illness:   Patient arrived under involuntary commitment via Patent examinerlaw enforcement. Petitioned by his Sister [name]. Petition states "[name] stated he wanted to kill himself. He stopped taking his meds. He has made threats to family members. He grabs knives and other things throwing them through the house."  TTS Assessment: Pt has a history of schizophrenia and is a poor historian to tangential thought process. He says he is here because his family "wants me to take my medication and shower." He says he feels "angry, upset, sad." Pt repeatedly says he shouldn't be at the hospital and should be with his family. He acknowledges he has had suicidal thoughts but says he wants to live. He acknowledges he has been brandishing knives because he believes people are trying to get into his house and harm him. He says he is "scared" of people and that he has to protect himself. When asked if he is experiencing hallucinations he says "I don't know." Pt initially says he takes his medication every morning and evening but then says he forgets. He says he has used drugs recently but doesn't know what the drug was.  Pt reports that he lives with his wife. He says he has a one-year-old daughter and another child that died. Pt says he tries to work but doesn't have a job. He says he thinks he has a court date pending but isn't certain and doesn't know what it is about. Pt says that people steal from him and that he gambles and loses money. Pt says he [facility]. He says [facility] in the past but cannot remember when.   On exam: Patient is alert and oriented x 4, cooperative. Speech is  clear, normal pace and volume. Patient asked for an interpreter, although he appears to speak english well. Refused tele interpreter. States "I need someone here that I will feel safe with." Thought process is tangential. Thought content is paranoid. Asked nurse if she could use an oral thermometer because "I do not feel safe with that one", referring to the forehead thermometer. Asked if it was the mark of the beast. States multiple times that he needs to return home because his family is not safe with him here. Told this Clinical research associatewriter that his daughter is staying with his grandmother, but would not discuss any further, stating "I don't feel safe sharing that information." Denies meth use, crack/cocaine use, reports he is not sure if he used marijuana. States "Yes, no, I am not sure if have or not." Denies current SI/HI/AVH.  Associated Signs/Symptoms: Depression Symptoms:  depressed mood, insomnia, psychomotor agitation, difficulty concentrating, suicidal thoughts without plan, (Hypo) Manic Symptoms:  Delusions, Impulsivity, Anxiety Symptoms:  Excessive Worry, Psychotic Symptoms:  Delusions, Paranoia, PTSD Symptoms: Negative Total Time spent with patient: 30 minutes  Past Psychiatric History: Schizophrenia. [facility] for drug induced psychosis.   Is the patient at risk to self? Yes.    Has the patient been a risk to self in the past 6 months? Yes.    Has the patient been a risk to self within the distant past? Yes.    Is the patient a risk to others? Yes.    Has the patient  been a risk to others in the past 6 months? Yes.    Has the patient been a risk to others within the distant past? Yes.     Prior Inpatient Therapy: Prior Inpatient Therapy: Yes Prior Therapy Dates: 07/2018, multiple admits Prior Therapy Facilty/Provider(s): [facility] Reason for Treatment: Schizophrenia Prior Outpatient Therapy: Prior Outpatient Therapy: Yes Prior Therapy Dates:  Current Prior Therapy Facilty/Provider(s): [facility] Reason for Treatment: Schizophrenia Does patient have an ACCT team?: No Does patient have Intensive In-House Services?  : No Does patient have [facility] services? : Yes Does patient have P4CC services?: No  Alcohol Screening:   Substance Abuse History in the last 12 months:  Yes.   Consequences of Substance Abuse: Medical Consequences:  Psychosis Previous Psychotropic Medications: Yes  Psychological Evaluations: Yes  Past Medical History:  Past Medical History:  Diagnosis Date  . Anxiety   . Depression   . Mental disorder   . Schizophrenia (HCC)    No past surgical history on file. Family History:  Family History  Problem Relation Age of Onset  . Mental illness Neg Hx    Family Psychiatric History:  Tobacco Screening:   Social History:  Social History   Substance and Sexual Activity  Alcohol Use Not Currently   Comment: haven't drank in months"     Social History   Substance and Sexual Activity  Drug Use Yes  . Types: Marijuana, Methamphetamines   Comment: Last used: 2 weeks ago    Additional Social History: Marital status: Married    Pain Medications: See MAR Prescriptions: See MAR Over the Counter: See MAR History of alcohol / drug use?: Yes(Pt reports he took a substance but doesn't know what it was) Longest period of sobriety (when/how long): UTA                    Allergies:  No Known Allergies Lab Results: No results found for this or any previous visit (from the past 48 hour(s)).  Blood Alcohol level:  Lab Results  Component Value Date   ETH <10 07/29/2018   ETH <10 07/16/2018    Metabolic Disorder Labs:  Lab Results  Component Value Date   HGBA1C 5.7 (H) 12/19/2017   MPG 116.89 12/19/2017   MPG 128 04/02/2015   Lab Results  Component Value Date   PROLACTIN 45.8 (H) 12/19/2017   PROLACTIN 7.0 04/04/2015   Lab Results  Component Value Date   CHOL 182 12/19/2017   TRIG 204 (H)  12/19/2017   HDL 30 (L) 12/19/2017   CHOLHDL 6.1 12/19/2017   VLDL 41 (H) 12/19/2017   LDLCALC 111 (H) 12/19/2017   LDLCALC 96 04/04/2015    Current Medications: Current Facility-Administered Medications  Medication Dose Route Frequency Provider Last Rate Last Dose  . hydrOXYzine (ATARAX/VISTARIL) 50 MG tablet           . risperiDONE (RISPERDAL) 3 MG tablet           . acetaminophen (TYLENOL) tablet 650 mg  650 mg Oral Q6H PRN Nira Conn A, NP      . alum & mag hydroxide-simeth (MAALOX/MYLANTA) 200-200-20 MG/5ML suspension 30 mL  30 mL Oral Q4H PRN Nira Conn A, NP      . benztropine (COGENTIN) tablet 0.5 mg  0.5 mg Oral BID Nira Conn A, NP      . hydrOXYzine (ATARAX/VISTARIL) tablet 50 mg  50 mg Oral TID PRN Nira Conn A, NP   50 mg at 10/30/18 0345  .  magnesium hydroxide (MILK OF MAGNESIA) suspension 30 mL  30 mL Oral Daily PRN Nira Conn A, NP      . risperiDONE (RISPERDAL) tablet 3 mg  3 mg Oral Daily Nira Conn A, NP   3 mg at 10/30/18 0344  . risperiDONE (RISPERDAL) tablet 4 mg  4 mg Oral QHS Nira Conn A, NP      . traZODone (DESYREL) tablet 300 mg  300 mg Oral QHS Nira Conn A, NP       PTA Medications: Medications Prior to Admission  Medication Sig Dispense Refill Last Dose  . benztropine (COGENTIN) 0.5 MG tablet Take 1 tablet (0.5 mg total) by mouth 2 (two) times daily. For prevention of drug induced tremors 60 tablet 0   . hydrOXYzine (ATARAX/VISTARIL) 50 MG tablet Take 1 tablet (50 mg total) by mouth 3 (three) times daily as needed for anxiety. 60 tablet 0   . nicotine (NICODERM CQ - DOSED IN MG/24 HOURS) 14 mg/24hr patch Place 1 patch (14 mg total) onto the skin daily. (May buy from over the counter): For smoking cessation 28 patch 0   . risperiDONE (RISPERDAL) 3 MG tablet Take 1 tablet (3 mg total) by mouth daily. For mood control 30 tablet 0 Unknown at Unknown time  . risperiDONE (RISPERDAL) 4 MG tablet Take 1 tablet (4 mg total) by mouth at bedtime. For  mood control 30 tablet 0 Unknown at Unknown time  . traZODone (DESYREL) 300 MG tablet Take 1 tablet (300 mg total) by mouth at bedtime. For sleep 30 tablet 0 Unknown at Unknown time    Musculoskeletal: Strength & Muscle Tone: within normal limits Gait & Station: normal Patient leans: Front  Psychiatric Specialty Exam: Physical Exam  Constitutional: He is oriented to person, place, and time. He appears well-developed and well-nourished. No distress.  HENT:  Head: Normocephalic and atraumatic.  Right Ear: External ear normal.  Left Ear: External ear normal.  Eyes: Pupils are equal, round, and reactive to light. Conjunctivae are normal. Right eye exhibits no discharge. Left eye exhibits no discharge.  Respiratory: Effort normal. No respiratory distress.  Musculoskeletal: Normal range of motion.  Neurological: He is alert and oriented to person, place, and time.  Skin: He is not diaphoretic.  Psychiatric: His mood appears anxious. Thought content is paranoid and delusional. He expresses impulsivity and inappropriate judgment. He exhibits a depressed mood. He expresses no homicidal and no suicidal ideation.    Review of Systems  Constitutional: Negative for chills, diaphoresis, fever, malaise/fatigue and weight loss.  Respiratory: Negative for cough and shortness of breath.   Cardiovascular: Negative for chest pain.  Gastrointestinal: Negative for diarrhea, nausea and vomiting.  Psychiatric/Behavioral: Positive for depression, substance abuse and suicidal ideas. Negative for hallucinations and memory loss. The patient is nervous/anxious and has insomnia.     Blood pressure (!) 145/109, pulse 93, temperature (!) 97.4 F (36.3 C), temperature source Oral, resp. rate 18, SpO2 99 %.There is no height or weight on file to calculate BMI.  General Appearance: Casual and Well Groomed  Eye Contact:  Fair  Speech:  Clear and Coherent  Volume:  Normal  Mood:  Anxious and Depressed  Affect:   Blunt and Congruent  Thought Process:  Disorganized  Orientation:  Full (Time, Place, and Person)  Thought Content:  Paranoid Ideation and Tangential  Suicidal Thoughts:  No  Homicidal Thoughts:  No  Memory:  Immediate;   Fair Recent;   Poor  Judgement:  Impaired  Insight:  Lacking  Psychomotor Activity:  Increased  Concentration:  Concentration: Poor and Attention Span: Poor  Recall:  [employer] of Knowledge:  Fair  Language:  Fair  Akathisia:  Negative  Handed:  Right  AIMS (if indicated):     Assets:  Desire for Improvement Financial Resources/Insurance Housing Intimacy Leisure Time Physical Health  ADL's:  Intact  Cognition:  WNL  Sleep:         Treatment Plan Summary: Daily contact with patient to assess and evaluate symptoms and progress in treatment, Medication management and Plan inpatient treatment  Observation Level/Precautions:  15 minute checks Laboratory:  CBC Chemistry Profile HbAIC UDS Lipids, TSH Psychotherapy:  Individual Medications:  Resumed home medications Risperdal 3 mg daily for mood stability Risperdal 4 mg QHS for mood stability Trazodone 300 mg QHS for sleep Vistaril 50 mg TID prn anxiety Cogentin 0.5 mg BID EPS prevention Consultations:  As needed Discharge Concerns:  Medication compliance Estimated OZD:[id number] treatment Other:      Jackelyn Poling, NP 4/26/20203:54 AM

## 2018-10-31 DIAGNOSIS — F15959 Other stimulant use, unspecified with stimulant-induced psychotic disorder, unspecified: Secondary | ICD-10-CM | POA: Diagnosis not present

## 2018-10-31 MED ORDER — TRAZODONE HCL 300 MG PO TABS: 300.0000 mg | ORAL_TABLET | Freq: Every day | ORAL | 0 refills | Status: DC

## 2018-10-31 MED ORDER — CARBAMAZEPINE ER 200 MG PO CP12: 200.0000 mg | ORAL_CAPSULE | Freq: Two times a day (BID) | ORAL | 0 refills | Status: DC

## 2018-10-31 MED ORDER — RISPERIDONE 3 MG PO TABS: 3.0000 mg | ORAL_TABLET | Freq: Every day | ORAL | 0 refills | Status: DC

## 2018-10-31 MED ORDER — BENZTROPINE MESYLATE 0.5 MG PO TABS: 0.5000 mg | ORAL_TABLET | Freq: Two times a day (BID) | ORAL | 0 refills | Status: DC

## 2018-10-31 MED ORDER — RISPERIDONE 4 MG PO TABS: 4.0000 mg | ORAL_TABLET | Freq: Every day | ORAL | 0 refills | Status: DC

## 2018-10-31 MED ORDER — HYDROXYZINE HCL 50 MG PO TABS: 50.0000 mg | ORAL_TABLET | Freq: Three times a day (TID) | ORAL | 0 refills | Status: DC | PRN

## 2018-10-31 NOTE — Progress Notes (Signed)
[name] [name] slept most of the day and continues to sleep tonight. He wakes for snack and medications. He is minimally verbal and appears depressed. No reports of S.I. or H.I. Compliant with medications but declines his Trazodone for tonight.

## 2018-10-31 NOTE — Discharge Summary (Addendum)
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 Anxiety   . Depression   . Mental disorder   . Schizophrenia ([place])    No past surgical history on file. Family History:  Family History  Problem Relation Age of Onset  . Mental illness Neg Hx    Family Psychiatric  History: none Social History:  Social History   Substance and Sexual Activity  Alcohol Use Not Currently   Comment: haven't drank in months"     Social History   Substance and Sexual Activity  Drug Use Yes  . Types: Marijuana, Methamphetamines   Comment: Last used: 2 weeks ago    Social History   Socioeconomic History  . Marital status: Single    Spouse name: Not on file  . Number of children: Not on file  . Years of education: Not on file  . Highest education level: Not on file  Occupational History  . Not on file  Social Needs  . Financial resource strain: Not on file  . Food insecurity:    Worry: Not on file    Inability: Not on file  . Transportation needs:    Medical: Not on file    Non-medical: Not on file  Tobacco Use  . Smoking status: Current Every Day Smoker    Packs/day: 1.00    Types: Cigarettes  . Smokeless tobacco: Former Network engineer and Sexual Activity  . Alcohol use: Not Currently    Comment: haven't drank in  months"  . Drug use: Yes    Types: Marijuana, Methamphetamines    Comment: Last used: 2 weeks ago  . Sexual activity: Yes    Birth control/protection: None  Lifestyle  . Physical activity:    Days per week: Not on file    Minutes per session: Not on file  . Stress: Not on file  Relationships  . Social connections:    Talks on phone: Not on file    Gets together: Not on file    Attends religious service: Not on file    Active member of club or organization: Not on file    Attends meetings of clubs or organizations: Not on file    Relationship status: Not on file  Other Topics Concern  . Not on file  Social History Narrative  . Not on file    Hospital Course:  On admission on 10/30/18 via TTS Assessment: Pt has a history of schizophrenia and is a poor historian to tangential thought process. He says he is here because his family "wants me to take my medication and shower." He says he feels "angry, upset, sad." Pt repeatedly says he shouldn't be at the hospital and should be with his family. He acknowledges he has had suicidal thoughts but says he wants to live. He acknowledges he has been brandishing knives because he believes people are trying to get into his house and harm him.  He says he is "scared" of people and that he has to protect himself. When asked if he is experiencing hallucinations he says "I don't know." Pt initially says he takes his medication every morning and evening but then says he forgets. He says he has used drugs recently but doesn't know what the drug was.  Pt reports that he lives with his wife. He says he has a one-year-old daughter and another child that died. Pt says he tries to work but doesn't have a job. He says he thinks he has a court date pending but isn't certain and doesn't know what it is about. Pt says that people steal from him and that he gambles and loses money. Pt says he receives outpatient medication management through [facility]. He says he has been  to [facility] in the past but cannot remember when.  10/31/18:  Patient seen in person by the psychiatrist and this provider.  Medications were restarted, he slept, and stabilized. He has met maximum benefit of hospitalization at this time.  Denies suicidal/homcidal ideations, hallucinations, and withdrawal symptoms.  He wants to leave and be with his family.  Medications ordered via electronically to his pharmacy.  Crisis numbers provided along with discharge instructions with follow-up information provided. Stable for discharge.  Physical Findings: AIMS: Facial and Oral Movements Muscles of Facial Expression: None, normal Lips and Perioral Area: None, normal Jaw: None, normal Tongue: None, normal,Extremity Movements Upper (arms, wrists, hands, fingers): None, normal Lower (legs, knees, ankles, toes): None, normal, Trunk Movements Neck, shoulders, hips: None, normal, Overall Severity Severity of abnormal movements (highest score from questions above): None, normal Incapacitation due to abnormal movements: None, normal Patient's awareness of abnormal movements (rate only patient's report): No Awareness, Dental Status Current problems with teeth and/or dentures?: No Does patient usually wear dentures?: No  CIWA:  CIWA-Ar Total: 7 COWS:  COWS Total Score: 4  Musculoskeletal: Strength & Muscle Tone: within normal limits Gait & Station: normal Patient leans: N/A  Psychiatric Specialty Exam: Physical Exam  Nursing note and vitals reviewed. Constitutional: He is oriented to person, place, and time. He appears well-developed and well-nourished.  HENT:  Head: Normocephalic.  Neck: Normal range of motion.  Respiratory: Effort normal.  Musculoskeletal: Normal range of motion.  Neurological: He is alert and oriented to person, place, and time.  Psychiatric: His speech is normal and behavior is normal. Judgment and thought content normal. His mood appears anxious. His affect is blunt.  Cognition and memory are normal.    Review of Systems  Psychiatric/Behavioral: Positive for substance abuse. The patient is nervous/anxious.   All other systems reviewed and are negative.   Blood pressure 114/66, pulse (!) 59, temperature 98.1 F (36.7 C), temperature source Oral, resp. rate 16, SpO2 100 %.There is no height or weight on file to calculate BMI.  General Appearance: Casual  Eye Contact:  Good  Speech:  Normal Rate  Volume:  Normal  Mood:  Anxious, mild  Affect:  Blunt  Thought Process:  Coherent and Descriptions of Associations: Intact  Orientation:  Full (Time, Place, and Person)  Thought Content:  WDL and Logical  Suicidal Thoughts:  No  Homicidal Thoughts:  No  Memory:  Immediate;   Good Recent;   Good Remote;   Good  Judgement:  Fair  Insight:  Fair  Psychomotor Activity:  Normal  Concentration:  Concentration: Good and Attention Span: Good  Recall:  Good  Fund of Knowledge:  Good  Language:  Good  Akathisia:  No  Handed:  Right  AIMS (if indicated):   N/A  Assets:  Leisure Time Physical Health Resilience Social Support Vocational/Educational  ADL's:  Intact  Cognition:  WNL  Sleep:   N/A        Has this patient used any form of tobacco in the last 30 days? (Cigarettes, Smokeless Tobacco, Cigars, and/or Pipes) Yes, Yes, A prescription for an FDA-approved tobacco cessation medication was offered at discharge and the patient refused  Blood Alcohol level:  Lab Results  Component Value Date   [facility] <10 07/29/2018   ETH <10 96/41/8937    Metabolic Disorder Labs:  Lab Results  Component Value Date   HGBA1C 5.3 10/30/2018   MPG 105.41 10/30/2018   MPG 116.89 12/19/2017   Lab Results  Component Value Date   PROLACTIN 45.8 (H) 12/19/2017   PROLACTIN 7.0 04/04/2015   Lab Results  Component Value Date   CHOL 139 10/30/2018   TRIG 77 10/30/2018   HDL 41 10/30/2018   CHOLHDL 3.4 10/30/2018   VLDL 15 10/30/2018   LDLCALC 83 10/30/2018    LDLCALC 111 (H) 12/19/2017    See Psychiatric Specialty Exam and Suicide Risk Assessment completed by Attending Physician prior to discharge.  Discharge destination:  Home  Is patient on multiple antipsychotic therapies at discharge:  No   Has Patient had three or more failed trials of antipsychotic monotherapy by history:  No  Recommended Plan for Multiple Antipsychotic Therapies: NA   Follow-up recommendations:  Activity:  as tolerated Diet:  heart healthy diet  Comments:  Discharge home  Signed: Waylan Boga, NP 10/31/2018, 11:16 AM   Patient seen face-to-face for psychiatric evaluation, chart reviewed and case discussed with the physician extender and developed treatment plan. Reviewed the information documented and agree with the treatment plan.  Buford Dresser, DO 10/31/18 2:56 PM

## 2018-10-31 NOTE — Progress Notes (Signed)
D: Pt A & O X 4. Denies SI, HI, AVH and pain at this time. D/C home as ordered. Presents with sullen affect and depressed out . Picked up by his father at Observation unit entrances.  A: D/C instructions reviewed with pt including  follow up appointment; compliance encouraged. All belongings from assigned locker given to pt at time of departure. Scheduled medications given with verbal education and effects monitored. Safety checks maintained without incident till time of d/c.  R: Pt receptive to care. Compliant with medications when offered. Denies adverse drug reactions when assessed. Verbalized understanding related to d/c instructions. Signed belonging sheet in agreement with items received from locker. Ambulatory with a steady gait. Appears to be in no physical distress at time of departure.

## 2018-11-16 ENCOUNTER — Other Ambulatory Visit: Payer: Self-pay

## 2018-11-16 ENCOUNTER — Observation Stay (HOSPITAL_COMMUNITY)
Admission: EM | Admit: 2018-11-16 | Discharge: 2018-11-16 | Disposition: A | Discharge status: Home / Self Care | Payer: Medicare Other | Attending: Psychiatry | Admitting: Psychiatry

## 2018-11-16 ENCOUNTER — Encounter (HOSPITAL_COMMUNITY): Payer: Self-pay | Admitting: Behavioral Health

## 2018-11-16 DIAGNOSIS — R45850 Homicidal ideations: Secondary | ICD-10-CM | POA: Diagnosis present

## 2018-11-16 DIAGNOSIS — F209 Schizophrenia, unspecified: Principal | ICD-10-CM | POA: Insufficient documentation

## 2018-11-16 DIAGNOSIS — F200 Paranoid schizophrenia: Secondary | ICD-10-CM | POA: Diagnosis present

## 2018-11-16 DIAGNOSIS — F250 Schizoaffective disorder, bipolar type: Secondary | ICD-10-CM | POA: Diagnosis not present

## 2018-11-16 DIAGNOSIS — F17210 Nicotine dependence, cigarettes, uncomplicated: Secondary | ICD-10-CM | POA: Diagnosis not present

## 2018-11-16 DIAGNOSIS — R45851 Suicidal ideations: Secondary | ICD-10-CM | POA: Diagnosis present

## 2018-11-16 MED ORDER — LORAZEPAM 2 MG/ML IJ SOLN: 2.0000 mg | INTRAMUSCULAR | Status: DC | PRN

## 2018-11-16 MED ORDER — LORAZEPAM 1 MG PO TABS: 2.0000 mg | ORAL_TABLET | ORAL | Status: DC | PRN

## 2018-11-16 MED ORDER — RISPERIDONE 2 MG PO TABS
4.0000 mg | ORAL_TABLET | Freq: Two times a day (BID) | ORAL | Status: DC
  Administered 2018-11-16: 17:00:00 4 mg via ORAL
  Filled 2018-11-16: qty 2

## 2018-11-16 MED ORDER — BENZTROPINE MESYLATE 1 MG PO TABS
1.0000 mg | ORAL_TABLET | Freq: Two times a day (BID) | ORAL | Status: DC
  Administered 2018-11-16: 17:00:00 1 mg via ORAL
  Filled 2018-11-16: qty 1

## 2018-11-16 MED ORDER — TEMAZEPAM 15 MG PO CAPS: 30.0000 mg | ORAL_CAPSULE | Freq: Every day | ORAL | Status: DC

## 2018-11-16 NOTE — Progress Notes (Signed)
This Clinical research associate contacted [employer], [facility] [facility] and gave report to [employer]. RN Harutyi had no further questions at time report was received.

## 2018-11-16 NOTE — Progress Notes (Signed)
Pt meets inpatient criteria per Denzil Magnuson, NP. Referral information has been sent to the following hospitals for review:  [place], [facility], [facility] [place], [place], [place], [address], [address], [place], [place]  Disposition will continue to assist with inpatient placement needs.   Wells Guiles, LCSW, LCAS Disposition CSW [facility]S [phone]

## 2018-11-16 NOTE — Progress Notes (Signed)
[place] NOVEL CORONAVIRUS (COVID-19) DAILY CHECK-OFF SYMPTOMS - answer yes or no to each - every day NO YES  Have you had a fever in the past 24 hours?  . Fever (Temp > 37.80C / 100F) X   Have you had any of these symptoms in the past 24 hours? . New Cough .  Sore Throat  .  Shortness of Breath .  Difficulty Breathing .  Unexplained Body Aches   X   Have you had any one of these symptoms in the past 24 hours not related to allergies?   . Runny Nose .  Nasal Congestion .  Sneezing   X   If you have had runny nose, nasal congestion, sneezing in the past 24 hours, has it worsened?  X   EXPOSURES - check yes or no X   Have you traveled outside the state in the past 14 days?  X   Have you been in contact with someone with a confirmed diagnosis of COVID-19 or PUI in the past 14 days without wearing appropriate PPE?  X   Have you been living in the same home as a person with confirmed diagnosis of COVID-19 or a PUI (household contact)?    X   Have you been diagnosed with COVID-19?    X              What to do next: Answered NO to all: Answered YES to anything:   Proceed with unit schedule Follow the [facility] Inpatient Flowsheet.   

## 2018-11-16 NOTE — Progress Notes (Signed)
Pt accepted to [facility]; [address] Roselle Locus is the attending/accepting provider Call report to [phone] Pt is involuntary and will be transported by law enforcement Pt may arrive at [facility] anytime.   Wells Guiles, LCSW, LCAS Disposition CSW [facility]/TTS [phone]

## 2018-11-16 NOTE — Progress Notes (Signed)
D: Pt arrived as a walk-in brought in by [employer]. Pt alert and oriented. Pt appears irritable/agitated/angry/paranoid. Pt denies experiencing any pain, SI/HI, or AVH at this time.   Pt denies knowing why he is here. Pt continues to state that he just wants to go home and be with his family. Pt has stayed in his room since being admitted and laid down. Pt since admission has been cooperative an calm. Pt had one outburst prior to admission. Pt also did not want to speak to a male during assessment process.   Skin assessment preformed. Pt has a tattoo on his left FA, some redness around wrist possibly of hand cuff, and a healing abrasion between knuckles on L hand.  A: Scheduled medications administered to pt, per MD orders. Support and encouragement provided. Frequent verbal contact made. Routine safety checks conducted q15 minutes.   R: No adverse drug reactions noted. Pt verbally contracts for safety at this time. Pt complaint with medications. Pt interacts with others on the unit when interaction is initiated by staff. Pt remains safe at this time. Will continue to monitor.

## 2018-11-16 NOTE — H&P (Signed)
Behavioral Health Medical Screening Exam  [name] [name] is an 38 y.o. male who presents to [facility] as a walk-in. He was transported by Patent examiner under involuntary commitment.  Patient is a poor historian. He states that he does not know why he was brought to [facility]. Per IVC, patient was petitioned by his sister [name] [name]. Per IVC, patient has not been taking his medication, not eating, sleeping, and not maintaining his hygiene. Per IVC, "[name] is constantly telling his family that he is going to kill himself and he is hearing voices and seeing ghost and the ghost are trying to rape him." Per chart review, patient has a history of schizophrenia. TTS did speak with patients sister and she reported that patient is abusing methamphetamines. Patient denies any of this although he appears very paranoid. His thought process is tangential. He states multiple times that, " I am feeling out of body. I just need to relax." He is irritable and restless pacing the floor. When asked if he was experiencing hallucinations he stated "I hear and see things" yet he will not elaborate on it. He stated he does take his medications but not, " all the time."  He reports receiving outpatient medication management at [facility]. His last appointment at [facility] he reports was 2 months ago.     Total Time spent with patient: 20 minutes  Psychiatric Specialty Exam: Physical Exam  Nursing note and vitals reviewed. Constitutional: He is oriented to person, place, and time.  Neurological: He is alert and oriented to person, place, and time.    Review of Systems  Psychiatric/Behavioral: Positive for depression and substance abuse. Negative for hallucinations and memory loss. The patient is nervous/anxious and has insomnia.   All other systems reviewed and are negative.   Blood pressure 133/86, pulse 100, temperature (!) 97.5 F (36.4 C), temperature source Oral.There is no height or weight on file to calculate BMI.  General  Appearance: Guarded  Eye Contact:  Fair  Speech:  Clear and Coherent and Normal Rate  Volume:  Normal  Mood:  Anxious  Affect:  Blunt  Thought Process:  Disorganized  Orientation:  Full (Time, Place, and Person)  Thought Content:  Paranoid Ideation and Tangential  Suicidal Thoughts:  No  Homicidal Thoughts:  No  Memory:  Immediate;   Fair Recent;   Poor  Judgement:  Impaired  Insight:  Lacking  Psychomotor Activity:  Increased  Concentration: Concentration: Poor and Attention Span: Poor  Recall:  Fair  Fund of Knowledge:Fair  Language: Fair  Akathisia:  Negative  Handed:  Right  AIMS (if indicated):     Assets:  Desire for Improvement Housing  Sleep:       Musculoskeletal: Strength & Muscle Tone: within normal limits Gait & Station: normal Patient leans: N/A  Blood pressure 133/86, pulse 100, temperature (!) 97.5 F (36.4 C), temperature source Oral.  Recommendations:  Based on my evaluation the patient does not appear to have an emergency medical condition.  Admits to inpatient unit. Daily contact with patient to assess and evaluate symptoms and progress in treatment, Medication management and [place] appropriate.    Denzil Magnuson, NP 11/16/2018, 3:13 PM

## 2018-11-16 NOTE — Progress Notes (Addendum)
Pt belongings returned along with  $ 1,141.67 in pt wallet. All belongings given to police officer . Money was counted with [facility] [employer] (English) . Pt D/C to [facility] for further Treatment. Pt not cooperative with transport , but pt decided to go with the officer without incident, pt refused all paperwork at this time.
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
## 2018-11-19 NOTE — Discharge Summary (Signed)
Patient was seen by another provider who for got to put in discharge orders.  I was asked if orders could be entered because patient was being transferred to another facility and transportation was on the way to pick up.    Patient was recommended for inpatient psychiatric treatment.  Patient was accepted to [facility] and transferred there for inpatient psychiatric treatment.    Pleas Carneal B. Harish Bram, NP

## 2018-12-07 ENCOUNTER — Ambulatory Visit (HOSPITAL_COMMUNITY): Admission: EM | Admit: 2018-12-07 | Payer: Medicare Other | Source: Home / Self Care

## 2019-01-07 DIAGNOSIS — R58 Hemorrhage, not elsewhere classified: Secondary | ICD-10-CM | POA: Diagnosis not present

## 2019-01-07 DIAGNOSIS — R51 Headache: Secondary | ICD-10-CM | POA: Diagnosis not present

## 2019-01-07 DIAGNOSIS — R52 Pain, unspecified: Secondary | ICD-10-CM | POA: Diagnosis not present

## 2019-01-08 ENCOUNTER — Emergency Department (HOSPITAL_COMMUNITY)
Admission: EM | Admit: 2019-01-08 | Discharge: 2019-01-08 | Disposition: A | Discharge status: Home / Self Care | Payer: Medicare Other | Attending: Emergency Medicine | Admitting: Emergency Medicine

## 2019-01-08 ENCOUNTER — Emergency Department (HOSPITAL_COMMUNITY): Payer: Medicare Other

## 2019-01-08 DIAGNOSIS — F17210 Nicotine dependence, cigarettes, uncomplicated: Secondary | ICD-10-CM | POA: Diagnosis not present

## 2019-01-08 DIAGNOSIS — Y92410 Unspecified street and highway as the place of occurrence of the external cause: Secondary | ICD-10-CM | POA: Insufficient documentation

## 2019-01-08 DIAGNOSIS — Y9389 Activity, other specified: Secondary | ICD-10-CM | POA: Diagnosis not present

## 2019-01-08 DIAGNOSIS — Z23 Encounter for immunization: Secondary | ICD-10-CM | POA: Diagnosis not present

## 2019-01-08 DIAGNOSIS — S0181XA Laceration without foreign body of other part of head, initial encounter: Secondary | ICD-10-CM | POA: Insufficient documentation

## 2019-01-08 DIAGNOSIS — S199XXA Unspecified injury of neck, initial encounter: Secondary | ICD-10-CM | POA: Diagnosis not present

## 2019-01-08 DIAGNOSIS — S299XXA Unspecified injury of thorax, initial encounter: Secondary | ICD-10-CM | POA: Diagnosis not present

## 2019-01-08 DIAGNOSIS — Y999 Unspecified external cause status: Secondary | ICD-10-CM | POA: Insufficient documentation

## 2019-01-08 DIAGNOSIS — S3991XA Unspecified injury of abdomen, initial encounter: Secondary | ICD-10-CM | POA: Diagnosis not present

## 2019-01-08 DIAGNOSIS — S0083XA Contusion of other part of head, initial encounter: Secondary | ICD-10-CM

## 2019-01-08 DIAGNOSIS — F1910 Other psychoactive substance abuse, uncomplicated: Secondary | ICD-10-CM | POA: Insufficient documentation

## 2019-01-08 DIAGNOSIS — S0990XA Unspecified injury of head, initial encounter: Secondary | ICD-10-CM | POA: Diagnosis not present

## 2019-01-08 DIAGNOSIS — S3993XA Unspecified injury of pelvis, initial encounter: Secondary | ICD-10-CM | POA: Diagnosis not present

## 2019-01-08 DIAGNOSIS — Z79899 Other long term (current) drug therapy: Secondary | ICD-10-CM | POA: Diagnosis not present

## 2019-01-08 LAB — I-STAT CHEM 8, ED
BUN: 10 mg/dL (ref 6–20)
Calcium, Ion: 1.13 mmol/L — ABNORMAL LOW (ref 1.15–1.40)
Chloride: 106 mmol/L (ref 98–111)
Creatinine, Ser: 0.9 mg/dL (ref 0.61–1.24)
Glucose, Bld: 99 mg/dL (ref 70–99)
HCT: 42 % (ref 39.0–52.0)
Hemoglobin: 14.3 g/dL (ref 13.0–17.0)
Potassium: 3.1 mmol/L — ABNORMAL LOW (ref 3.5–5.1)
Sodium: 142 mmol/L (ref 135–145)
TCO2: 25 mmol/L (ref 22–32)

## 2019-01-08 LAB — CBC
HCT: 42.5 % (ref 39.0–52.0)
Hemoglobin: 13.6 g/dL (ref 13.0–17.0)
MCH: 26 pg (ref 26.0–34.0)
MCHC: 32 g/dL (ref 30.0–36.0)
MCV: 81.1 fL (ref 80.0–100.0)
Platelets: 243 10*3/uL (ref 150–400)
RBC: 5.24 MIL/uL (ref 4.22–5.81)
RDW: 13.2 % (ref 11.5–15.5)
WBC: 7 10*3/uL (ref 4.0–10.5)
nRBC: 0 % (ref 0.0–0.2)

## 2019-01-08 LAB — COMPREHENSIVE METABOLIC PANEL
ALT: 27 U/L (ref 0–44)
AST: 21 U/L (ref 15–41)
Albumin: 4.2 g/dL (ref 3.5–5.0)
Alkaline Phosphatase: 68 U/L (ref 38–126)
Anion gap: 11 (ref 5–15)
BUN: 10 mg/dL (ref 6–20)
CO2: 23 mmol/L (ref 22–32)
Calcium: 9.4 mg/dL (ref 8.9–10.3)
Chloride: 106 mmol/L (ref 98–111)
Creatinine, Ser: 0.96 mg/dL (ref 0.61–1.24)
GFR calc Af Amer: >60 mL/min (ref >60)
GFR calc non Af Amer: >60 mL/min (ref >60)
Glucose, Bld: 101 mg/dL — ABNORMAL HIGH (ref 70–99)
Potassium: 3.1 mmol/L — ABNORMAL LOW (ref 3.5–5.1)
Sodium: 140 mmol/L (ref 135–145)
Total Bilirubin: 1.1 mg/dL (ref 0.3–1.2)
Total Protein: 7.1 g/dL (ref 6.5–8.1)

## 2019-01-08 LAB — CDS SEROLOGY

## 2019-01-08 LAB — LACTIC ACID, PLASMA: Lactic Acid, Venous: 0.9 mmol/L (ref 0.5–1.9)

## 2019-01-08 LAB — SAMPLE TO BLOOD BANK

## 2019-01-08 LAB — PROTIME-INR
INR: 1.2 (ref 0.8–1.2)
Prothrombin Time: 14.6 seconds (ref 11.4–15.2)

## 2019-01-08 LAB — ACETAMINOPHEN LEVEL: Acetaminophen (Tylenol), Serum: <10 ug/mL — ABNORMAL LOW (ref 10–30)

## 2019-01-08 LAB — ETHANOL: Alcohol, Ethyl (B): <10 mg/dL (ref <10)

## 2019-01-08 LAB — SALICYLATE LEVEL: Salicylate Lvl: <7 mg/dL (ref 2.8–30.0)

## 2019-01-08 MED ORDER — LIDOCAINE-EPINEPHRINE (PF) 2 %-1:200000 IJ SOLN
10.0000 mL | Freq: Once | INTRAMUSCULAR | Status: AC
  Administered 2019-01-08: 10 mL
  Filled 2019-01-08: qty 20

## 2019-01-08 MED ORDER — LORAZEPAM 2 MG/ML IJ SOLN
1.0000 mg | Freq: Once | INTRAMUSCULAR | Status: AC
  Administered 2019-01-08: 1 mg via INTRAVENOUS
  Filled 2019-01-08: qty 1

## 2019-01-08 MED ORDER — TETANUS-DIPHTH-ACELL PERTUSSIS 5-2.5-18.5 LF-MCG/0.5 IM SUSP
0.5000 mL | Freq: Once | INTRAMUSCULAR | Status: AC
  Administered 2019-01-08: 0.5 mL via INTRAMUSCULAR
  Filled 2019-01-08: qty 0.5

## 2019-01-08 MED ORDER — POTASSIUM CHLORIDE CRYS ER 20 MEQ PO TBCR
40.0000 meq | EXTENDED_RELEASE_TABLET | Freq: Once | ORAL | Status: AC
  Administered 2019-01-08: 40 meq via ORAL
  Filled 2019-01-08: qty 2

## 2019-01-08 MED ORDER — IOHEXOL 300 MG/ML  SOLN
100.0000 mL | Freq: Once | INTRAMUSCULAR | Status: AC | PRN
  Administered 2019-01-08: 03:00:00 100 mL via INTRAVENOUS

## 2019-01-08 MED ORDER — SODIUM CHLORIDE 0.9 % IV BOLUS
500.0000 mL | Freq: Once | INTRAVENOUS | Status: AC
  Administered 2019-01-08: 03:00:00 500 mL via INTRAVENOUS
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
## 2019-01-08 NOTE — ED Notes (Signed)
Attempted to irrigate forehead; pt would not tolerate
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
## 2019-01-08 NOTE — ED Notes (Signed)
E-signature pad no available to obtain patient signature at this time.

## 2019-01-08 NOTE — ED Notes (Signed)
Patient transported to X-ray 

## 2019-01-08 NOTE — ED Notes (Signed)
Pt given discharge instructions, follow up information, and pain management. Pt given the opportunity to ask questions. Pt verbalized understanding. Pt alert and oriented x4 able to ambulate independently with a steady gait. Pt reports he has a friend coming to pick him up. IV removed and pt discharged from the ED.

## 2019-01-12 ENCOUNTER — Emergency Department (HOSPITAL_COMMUNITY)
Admission: EM | Admit: 2019-01-12 | Discharge: 2019-01-13 | Disposition: A | Discharge status: Other Acute Inpatient Hospital | Payer: Medicare Other | Attending: Emergency Medicine | Admitting: Emergency Medicine

## 2019-01-12 ENCOUNTER — Encounter (HOSPITAL_COMMUNITY): Payer: Self-pay | Admitting: Emergency Medicine

## 2019-01-12 ENCOUNTER — Other Ambulatory Visit: Payer: Self-pay

## 2019-01-12 DIAGNOSIS — Z03818 Encounter for observation for suspected exposure to other biological agents ruled out: Secondary | ICD-10-CM | POA: Insufficient documentation

## 2019-01-12 DIAGNOSIS — F29 Unspecified psychosis not due to a substance or known physiological condition: Secondary | ICD-10-CM | POA: Diagnosis not present

## 2019-01-12 DIAGNOSIS — R4689 Other symptoms and signs involving appearance and behavior: Secondary | ICD-10-CM | POA: Insufficient documentation

## 2019-01-12 DIAGNOSIS — R451 Restlessness and agitation: Secondary | ICD-10-CM | POA: Diagnosis not present

## 2019-01-12 DIAGNOSIS — F17210 Nicotine dependence, cigarettes, uncomplicated: Secondary | ICD-10-CM | POA: Insufficient documentation

## 2019-01-12 DIAGNOSIS — F250 Schizoaffective disorder, bipolar type: Secondary | ICD-10-CM | POA: Insufficient documentation

## 2019-01-12 DIAGNOSIS — R443 Hallucinations, unspecified: Secondary | ICD-10-CM | POA: Diagnosis not present

## 2019-01-12 DIAGNOSIS — R440 Auditory hallucinations: Secondary | ICD-10-CM | POA: Insufficient documentation

## 2019-01-12 LAB — CBC
HCT: 43.6 % (ref 39.0–52.0)
Hemoglobin: 13.7 g/dL (ref 13.0–17.0)
MCH: 25.8 pg — ABNORMAL LOW (ref 26.0–34.0)
MCHC: 31.4 g/dL (ref 30.0–36.0)
MCV: 82.1 fL (ref 80.0–100.0)
Platelets: 258 10*3/uL (ref 150–400)
RBC: 5.31 MIL/uL (ref 4.22–5.81)
RDW: 13.2 % (ref 11.5–15.5)
WBC: 7.6 10*3/uL (ref 4.0–10.5)
nRBC: 0 % (ref 0.0–0.2)

## 2019-01-12 LAB — RAPID URINE DRUG SCREEN, HOSP PERFORMED
Amphetamines: NOT DETECTED
Barbiturates: NOT DETECTED
Benzodiazepines: NOT DETECTED
Cocaine: NOT DETECTED
Opiates: NOT DETECTED
Tetrahydrocannabinol: NOT DETECTED

## 2019-01-12 LAB — COMPREHENSIVE METABOLIC PANEL
ALT: 22 U/L (ref 0–44)
AST: 15 U/L (ref 15–41)
Albumin: 4 g/dL (ref 3.5–5.0)
Alkaline Phosphatase: 77 U/L (ref 38–126)
Anion gap: 11 (ref 5–15)
BUN: 10 mg/dL (ref 6–20)
CO2: 23 mmol/L (ref 22–32)
Calcium: 9 mg/dL (ref 8.9–10.3)
Chloride: 105 mmol/L (ref 98–111)
Creatinine, Ser: 0.93 mg/dL (ref 0.61–1.24)
GFR calc Af Amer: >60 mL/min (ref >60)
GFR calc non Af Amer: >60 mL/min (ref >60)
Glucose, Bld: 101 mg/dL — ABNORMAL HIGH (ref 70–99)
Potassium: 3.7 mmol/L (ref 3.5–5.1)
Sodium: 139 mmol/L (ref 135–145)
Total Bilirubin: 0.3 mg/dL (ref 0.3–1.2)
Total Protein: 7.3 g/dL (ref 6.5–8.1)

## 2019-01-12 LAB — SARS CORONAVIRUS 2 BY RT PCR (HOSPITAL ORDER, PERFORMED IN ~~LOC~~ HOSPITAL LAB): SARS Coronavirus 2: NEGATIVE

## 2019-01-12 LAB — SALICYLATE LEVEL: Salicylate Lvl: <7 mg/dL (ref 2.8–30.0)

## 2019-01-12 LAB — ACETAMINOPHEN LEVEL: Acetaminophen (Tylenol), Serum: <10 ug/mL — ABNORMAL LOW (ref 10–30)

## 2019-01-12 LAB — ETHANOL: Alcohol, Ethyl (B): <10 mg/dL (ref <10)

## 2019-01-12 MED ORDER — RISPERIDONE 2 MG PO TBDP
2.0000 mg | ORAL_TABLET | Freq: Three times a day (TID) | ORAL | Status: DC | PRN
  Administered 2019-01-13: 2 mg via ORAL
  Filled 2019-01-12 (×2): qty 1

## 2019-01-12 MED ORDER — LORAZEPAM 1 MG PO TABS: 1.0000 mg | ORAL_TABLET | ORAL | Status: DC | PRN

## 2019-01-12 MED ORDER — RISPERIDONE 3 MG PO TABS
4.0000 mg | ORAL_TABLET | Freq: Every day | ORAL | Status: DC
  Filled 2019-01-12: qty 2

## 2019-01-12 MED ORDER — LORAZEPAM 2 MG/ML IJ SOLN
2.0000 mg | Freq: Once | INTRAMUSCULAR | Status: AC
  Administered 2019-01-12: 19:00:00 2 mg via INTRAMUSCULAR
  Filled 2019-01-12: qty 1

## 2019-01-12 MED ORDER — ACETAMINOPHEN 325 MG PO TABS: 650.0000 mg | ORAL_TABLET | ORAL | Status: DC | PRN

## 2019-01-12 MED ORDER — ZIPRASIDONE MESYLATE 20 MG IM SOLR: 20.0000 mg | INTRAMUSCULAR | Status: DC | PRN

## 2019-01-12 MED ORDER — HALOPERIDOL LACTATE 5 MG/ML IJ SOLN
5.0000 mg | Freq: Once | INTRAMUSCULAR | Status: AC
  Administered 2019-01-12: 19:00:00 5 mg via INTRAMUSCULAR
  Filled 2019-01-12: qty 1

## 2019-01-12 MED ORDER — BENZTROPINE MESYLATE 1 MG PO TABS
0.5000 mg | ORAL_TABLET | Freq: Two times a day (BID) | ORAL | Status: DC
  Administered 2019-01-13: 0.5 mg via ORAL
  Filled 2019-01-12: qty 1

## 2019-01-12 NOTE — Progress Notes (Signed)
TTS attempted to assess pt who is sleeping and unable to be assessed. ED staff present in the pt's room tapping him and trying to get him to wake up but the pt does not respond. TTS to assess when the pt is alert and able to be seen.  Lind Covert, MSW, LCSW Therapeutic Triage Specialist  [phone]

## 2019-01-12 NOTE — ED Triage Notes (Signed)
Pt here with [employer] for IVC, IVC papers have been completed by his sister. Per sister, he has been talking to people who are not there, hearing voices telling him to hurt himself and hurt others. Pt wife also left him, sister reports his is doing crystal meth too. Pt denies everything.

## 2019-01-12 NOTE — ED Notes (Signed)
Pt has been wanded by security. 

## 2019-01-12 NOTE — ED Provider Notes (Signed)
[facility] EMERGENCY DEPARTMENT Provider Note   CSN: [id number] Arrival date & time: 01/12/19  1701     History   Chief Complaint Chief Complaint  Patient presents with  . IVC    HPI [name] [name] is a 38 y.o. male.  He has a history of schizophrenia.  He has brought in on IVC that his sister took out as he has been acting very erratically and hearing voices.  There is suspicion that he is not taking his medications and he may be using alcohol and crystal meth.  Patient denies this.  He says he has been taking his Risperdal regularly and that the voices are under control.  He denies any medical complaints.     The history is provided by the patient and the police.  Mental Health Problem Presenting symptoms: agitation, bizarre behavior, disorganized thought process and hallucinations   Patient accompanied by:  Law enforcement Degree of incapacity (severity):  Moderate Onset quality:  Unable to specify Chronicity:  Recurrent Context: alcohol use, drug abuse and noncompliance   Relieved by:  None tried Associated symptoms: no abdominal pain, no chest pain and no headaches     Past Medical History:  Diagnosis Date  . Anxiety   . Depression   . Mental disorder   . Schizophrenia [facility])     Patient Active Problem List   Diagnosis Date Noted  . 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 Schizoaffective disorder, bipolar type ([place]) 05/07/2016  . Cocaine abuse ([place]) 05/07/2016  . Cannabis use disorder, moderate, in sustained remission ([place]) 04/03/2015  . Alcohol use disorder, moderate, in sustained remission ([place]) 04/03/2015    History reviewed. No pertinent surgical history.      Home Medications    Prior to Admission medications   Medication Sig Start Date End Date Taking? Authorizing Provider  benztropine (COGENTIN) 0.5 MG tablet Take 1 tablet (0.5 mg total) by mouth 2 (two) times daily. 10/31/18   Patrecia Pour, NP  risperidone  (RISPERDAL) 4 MG tablet Take 1 tablet (4 mg total) by mouth at bedtime. For mood control 10/31/18   Patrecia Pour, NP    Family History Family History  Problem Relation Age of Onset  . Mental illness Neg Hx     Social History Social History   Tobacco Use  . Smoking status: Current Every Day Smoker    Packs/day: 1.00    Types: Cigarettes  . Smokeless tobacco: Former Network engineer Use Topics  . Alcohol use: Not Currently    Comment: haven't drank in months"  . Drug use: Yes    Types: Marijuana, Methamphetamines    Comment: Per sister, Pt uses meth     Allergies   Patient has no known allergies.   Review of Systems Review of Systems  Constitutional: Negative for fever.  HENT: Negative for sore throat.   Eyes: Negative for visual disturbance.  Respiratory: Negative for shortness of breath.   Cardiovascular: Negative for chest pain.  Gastrointestinal: Negative for abdominal pain.  Genitourinary: Negative for dysuria.  Musculoskeletal: Negative for neck pain.  Skin: Negative for rash.  Neurological: Negative for headaches.  Psychiatric/Behavioral: Positive for agitation and hallucinations.     Physical Exam Updated Vital Signs BP 125/81   Pulse 84   Temp 98.8 F (37.1 C) (Oral)   Resp 20   SpO2 98%   Physical Exam Vitals signs and nursing note reviewed.  Constitutional:      Appearance: He is well-developed.  HENT:     Head: Normocephalic.     Comments: Sutured wound on forehead without signs of infection. Eyes:     Conjunctiva/sclera: Conjunctivae normal.  Neck:     Musculoskeletal: Neck supple.  Cardiovascular:     Rate and Rhythm: Normal rate and regular rhythm.     Heart sounds: No murmur.  Pulmonary:     Effort: Pulmonary effort is normal. No respiratory distress.     Breath sounds: Normal breath sounds.  Abdominal:     Palpations: Abdomen is soft.     Tenderness: There is no abdominal tenderness.  Musculoskeletal: Normal range of motion.   Skin:    General: Skin is warm and dry.  Neurological:     General: No focal deficit present.     Mental Status: He is alert.     Comments: Patient is awake and alert.  His thought process seems a little erratic.  He is in control talking with me but he does admit that he is hearing some voices.  Staff said he has been a little more erratic and having some outbursts with the police officers.      ED Treatments / Results  Labs (all labs ordered are listed, but only abnormal results are displayed) Labs Reviewed  COMPREHENSIVE METABOLIC PANEL - Abnormal; Notable for the following components:      Result Value   Glucose, Bld 101 (*)    All other components within normal limits  ACETAMINOPHEN LEVEL - Abnormal; Notable for the following components:   Acetaminophen (Tylenol), Serum <10 (*)    All other components within normal limits  CBC - Abnormal; Notable for the following components:   MCH 25.8 (*)    All other components within normal limits  SARS CORONAVIRUS 2 (HOSPITAL ORDER, PERFORMED IN [facility])  ETHANOL  SALICYLATE LEVEL  RAPID URINE DRUG SCREEN, HOSP PERFORMED    EKG None  Radiology No results found.  Procedures Procedures (including critical care time)  Medications Ordered in ED Medications  haloperidol lactate (HALDOL) injection 5 mg (has no administration in time range)  LORazepam (ATIVAN) injection 2 mg (has no administration in time range)     Initial Impression / Assessment and Plan / ED Course  I have reviewed the triage vital signs and the nursing notes.  Pertinent labs & imaging results that were available during my care of the patient were reviewed by me and considered in my medical decision making (see chart for details).  Clinical Course as of Jan 12 933  Thu Jan 12, 2019  [id number] 38 year old male on an IVC for erratic behavior   [MB]    Clinical Course User Index [MB] Terrilee FilesButler, Vraj Denardo C, MD      Final Clinical Impressions(s)  / ED Diagnoses   Final diagnoses:  Psychosis, unspecified psychosis type [facility](HCC)    ED Discharge Orders    None       Terrilee FilesButler, Patrese Neal C, MD 01/13/19 [phone]

## 2019-01-12 NOTE — BHH Counselor (Addendum)
Clinician called TTS cart a few times however no answer as TTS machine is in another pt's room. Clinician spoke to [place], [place] about the location of the TTS cart and was instructed to call [place], RN. Clinician noted TTS cart is available is she will have someone bring it to the pt.   Discussed with Huel Coventry, RN.  Vertell Novak, [place], [facility], [facility] Triage Specialist [phone]

## 2019-01-13 ENCOUNTER — Inpatient Hospital Stay (HOSPITAL_COMMUNITY)
Admission: AD | Admit: 2019-01-13 | Discharge: 2019-01-17 | DRG: 885 | Disposition: A | Discharge status: Home / Self Care | Payer: Medicare Other | Attending: Psychiatry | Admitting: Psychiatry

## 2019-01-13 ENCOUNTER — Encounter (HOSPITAL_COMMUNITY): Payer: Self-pay | Admitting: Behavioral Health

## 2019-01-13 ENCOUNTER — Other Ambulatory Visit: Payer: Self-pay

## 2019-01-13 DIAGNOSIS — Z9114 Patient's other noncompliance with medication regimen: Secondary | ICD-10-CM | POA: Diagnosis not present

## 2019-01-13 DIAGNOSIS — Y92410 Unspecified street and highway as the place of occurrence of the external cause: Secondary | ICD-10-CM

## 2019-01-13 DIAGNOSIS — F17210 Nicotine dependence, cigarettes, uncomplicated: Secondary | ICD-10-CM | POA: Diagnosis not present

## 2019-01-13 DIAGNOSIS — F329 Major depressive disorder, single episode, unspecified: Secondary | ICD-10-CM | POA: Diagnosis not present

## 2019-01-13 DIAGNOSIS — F19959 Other psychoactive substance use, unspecified with psychoactive substance-induced psychotic disorder, unspecified: Secondary | ICD-10-CM | POA: Diagnosis present

## 2019-01-13 DIAGNOSIS — L989 Disorder of the skin and subcutaneous tissue, unspecified: Secondary | ICD-10-CM | POA: Diagnosis present

## 2019-01-13 DIAGNOSIS — F259 Schizoaffective disorder, unspecified: Principal | ICD-10-CM | POA: Diagnosis present

## 2019-01-13 DIAGNOSIS — F419 Anxiety disorder, unspecified: Secondary | ICD-10-CM | POA: Diagnosis not present

## 2019-01-13 DIAGNOSIS — F1994 Other psychoactive substance use, unspecified with psychoactive substance-induced mood disorder: Secondary | ICD-10-CM | POA: Diagnosis not present

## 2019-01-13 DIAGNOSIS — Z79899 Other long term (current) drug therapy: Secondary | ICD-10-CM | POA: Diagnosis not present

## 2019-01-13 DIAGNOSIS — F250 Schizoaffective disorder, bipolar type: Secondary | ICD-10-CM | POA: Diagnosis not present

## 2019-01-13 MED ORDER — ACETAMINOPHEN 325 MG PO TABS: 650.0000 mg | ORAL_TABLET | Freq: Four times a day (QID) | ORAL | Status: DC | PRN

## 2019-01-13 MED ORDER — HALOPERIDOL LACTATE 5 MG/ML IJ SOLN
5.0000 mg | Freq: Once | INTRAMUSCULAR | Status: DC
  Filled 2019-01-13: qty 1

## 2019-01-13 MED ORDER — ALUM & MAG HYDROXIDE-SIMETH 200-200-20 MG/5ML PO SUSP: 30.0000 mL | ORAL | Status: DC | PRN

## 2019-01-13 MED ORDER — BENZTROPINE MESYLATE 0.5 MG PO TABS
0.5000 mg | ORAL_TABLET | Freq: Two times a day (BID) | ORAL | Status: DC
  Administered 2019-01-13 – 2019-01-17 (×7): 0.5 mg via ORAL
  Filled 2019-01-13 (×13): qty 1

## 2019-01-13 MED ORDER — RISPERIDONE 2 MG PO TABS
4.0000 mg | ORAL_TABLET | Freq: Every day | ORAL | Status: DC
  Administered 2019-01-13: 4 mg via ORAL
  Filled 2019-01-13 (×4): qty 2

## 2019-01-13 MED ORDER — MAGNESIUM HYDROXIDE 400 MG/5ML PO SUSP: 30.0000 mL | Freq: Every day | ORAL | Status: DC | PRN

## 2019-01-13 MED ORDER — LORAZEPAM 2 MG/ML IJ SOLN: 2.0000 mg | Freq: Once | INTRAMUSCULAR | Status: DC

## 2019-01-13 NOTE — BH Assessment (Signed)
BHH Assessment Progress Note Case was staffed with [place] NP who recommended a inpatient admission to assist with stabilization.         
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
## 2019-01-13 NOTE — ED Notes (Signed)
TTS in process 

## 2019-01-13 NOTE — Tx Team (Signed)
Initial Treatment Plan 01/13/2019 6:06 PM [name] CZY:[id number]    PATIENT STRESSORS: UTA   PATIENT STRENGTHS: Other: UTA   PATIENT IDENTIFIED PROBLEMS: UTA  UTA                   DISCHARGE CRITERIA:  Ability to meet basic life and health needs Adequate post-discharge living arrangements Improved stabilization in mood, thinking, and/or behavior  PRELIMINARY DISCHARGE PLAN: Attend aftercare/continuing care group Attend PHP/IOP  PATIENT/FAMILY INVOLVEMENT: This treatment plan has been presented to and reviewed with the patient, [name] [name], and/or family member.  The patient and family have been given the opportunity to ask questions and make suggestions.  Marissa Calamity, RN 01/13/2019, 6:06 PM

## 2019-01-13 NOTE — ED Notes (Signed)
Pt eating breakfast, provided a toothbrush by sitter.

## 2019-01-13 NOTE — ED Notes (Signed)
Breakfast ordered 

## 2019-01-13 NOTE — Progress Notes (Addendum)
Pt accepted to [facility], Bed 309-1i Priscille Loveless, NP is the accepting provider.  Neita Garnet, MD,  is the attending provider.  Call report to [phone]  [facility] ED notified.   Pt is IVC .  Pt may be transported by [employer] Pt scheduled  to arrive [facility] as soon as transport can be arranged.  Areatha Keas. Judi Cong, MSW, [place] Disposition Clinical Social Work [phone] (cell) [phone] (office) .

## 2019-01-13 NOTE — BHH Counselor (Signed)
Clinician attempted to engage the pt in TTS assessment however he dosed off (was redirected) and speech was unclear. Call TTS once pt is alert, oriented and roused.    Vertell Novak, MS, [facility], [facility] Triage Specialist [phone].

## 2019-01-13 NOTE — BH Assessment (Signed)
Assessment Note  [name] [name] is an 38 y.o. male that presents this date with IVC. Patient has a noted history of schizophrenia and was transported to [facility] by [employer]. Patient has been residing with sister [name] [name] [phone] who initiated IVC. This writer attempted to contact sister unsuccessfully this date. Per notes, sister reports that patient has been acting very erratically and hearing voices. Sister reports that he is not taking his medications and may be using alcohol and crystal methamphetamine. Patient will not respond to this writer when asked in reference to SA use patterns and UDS is negative this date. Patient is observed to be speaking in a low soft voice that is inaudible at times and renders limited history. Patient will not answer any questions in reference to his history or current medication compliance. Patient shakes his head "no" to most questions. Patient seems to be comprehending the content of this writer's questions and does not appear to be responding yo internal stimuli. Per notes patient states he has been taking his Risperdal regularly and that the voices are under control. Patient denies any S/I, H/I or AVH this date. Patient is a poor historian and does not know why he presented to [facility]. Per IVC, patient was petitioned by his sister [name] [name]. Per IVC, patient has not been taking his medication, not eating, sleeping, and not maintaining his hygiene. Per IVC patient has also been making threats to family. Per chart review, patient has a history of schizophrenia. Patient denies any of this although he appears very paranoid and his thought process seems to be very disorganized. Per chart review patient has been receiving OP services from [facility] who assists with medication management although it is unclear when patient last met with that provider or is currently compliant with his medication regimen. Per chart review patient was last seen on 11/16/18 when he presented with IVC at that  time also with similar symptoms. Per admission note by RN patient is here  with [employer] for IVC, IVC papers have been completed by his sister. Per sister, he has been talking to people who are not there, hearing voices telling him to hurt himself and hurt others. Patient wife also left him, sister reports his is doing crystal methamphetamine.  Patient denies everything. Case was staffed with [name] Pate NP who recommended a inpatient admission to assist with stabilization.    Diagnosis: F29.0 Schizophrenia  Past Medical History:  Past Medical History:  Diagnosis Date  . Anxiety   . Depression   . Mental disorder   . Schizophrenia ([place])     History reviewed. No pertinent surgical history.  Family History:  Family History  Problem Relation Age of Onset  . Mental illness Neg Hx     Social History:  reports that he has been smoking cigarettes. He has been smoking about 1.00 pack per day. He has quit using smokeless tobacco. He reports previous alcohol use. He reports current drug use. Drugs: Marijuana and Methamphetamines.  Additional Social History:  Alcohol / Drug Use Pain Medications: See MAR Prescriptions: See MAR Over the Counter: See MAR History of alcohol / drug use?: Yes Longest period of sobriety (when/how long): Unk Negative Consequences of Use: (Denies) Withdrawal Symptoms: (Denies) Substance #1 Name of Substance 1: Methamphetamine per hx 1 - Age of First Use: UTA 1 - Amount (size/oz): UTA 1 - Frequency: UTA 1 - Duration: UTA 1 - Last Use / Amount: UTA  CIWA: CIWA-Ar BP: 123/84 Pulse Rate: 68 COWS:  Allergies: No Known Allergies  Home Medications: (Not in a hospital admission)   OB/GYN Status:  No LMP for male patient.  General Assessment Data Assessment unable to be completed: Yes Reason for not completing assessment: TTS attempted to assess pt who is sleeping and unable to be assessed. ED staff present in the pt's room tapping him and trying to get him to wake  up but the pt does not respond.  Location of Assessment: [facility] ED TTS Assessment: In system Is this a Tele or Face-to-Face Assessment?: Tele Assessment Is this an Initial Assessment or a Re-assessment for this encounter?: Initial Assessment Patient Accompanied by:: N/A Language Other than English: No Living Arrangements: Other (Comment) What gender do you identify as?: Male Marital status: Married Living Arrangements: Other relatives Can pt return to current living arrangement?: Yes Admission Status: Involuntary Petitioner: Family member Is patient capable of signing voluntary admission?: Yes Referral Source: Self/Family/Friend Insurance type: Medicare  Medical Screening Exam ([place]) Medical Exam completed: Yes  Crisis Care Plan Living Arrangements: Other relatives Legal Guardian: (NA) Name of Psychiatrist: [place] Name of Therapist: None  Education Status Is patient currently in school?: No Is the patient employed, unemployed or receiving disability?: Receiving disability income  Risk to self with the past 6 months Suicidal Ideation: Yes-Currently Present Has patient been a risk to self within the past 6 months prior to admission? : No Suicidal Intent: No Has patient had any suicidal intent within the past 6 months prior to admission? : No Is patient at risk for suicide?: Yes Suicidal Plan?: No Has patient had any suicidal plan within the past 6 months prior to admission? : No Access to Means: No What has been your use of drugs/alcohol within the last 12 months?: Past use Previous Attempts/Gestures: Yes How many times?: 1 Other Self Harm Risks: (Off medications) Triggers for Past Attempts: Unknown Intentional Self Injurious Behavior: None Family Suicide History: No Recent stressful life event(s): Other (Comment)(Off medications) Persecutory voices/beliefs?: No Depression: No Depression Symptoms: (Denies) Substance abuse history and/or treatment for  substance abuse?: No Suicide prevention information given to non-admitted patients: Not applicable  Risk to Others within the past 6 months Homicidal Ideation: No Does patient have any lifetime risk of violence toward others beyond the six months prior to admission? : Yes (comment)(Threats in past to family) Thoughts of Harm to Others: No Current Homicidal Intent: No Current Homicidal Plan: No Access to Homicidal Means: No Identified Victim: NA History of harm to others?: Yes Assessment of Violence: In distant past Violent Behavior Description: Threats to family Does patient have access to weapons?: No Criminal Charges Pending?: No Does patient have a court date: No Is patient on probation?: No  Psychosis Hallucinations: (UTA) Delusions: (UTA)  Mental Status Report Appearance/Hygiene: In scrubs Eye Contact: Unable to Assess Motor Activity: Unremarkable Speech: Soft, Slow Level of Consciousness: Quiet/awake Mood: Depressed Affect: Sad Anxiety Level: Minimal Thought Processes: Unable to Assess Judgement: Unable to Assess Orientation: Unable to assess Obsessive Compulsive Thoughts/Behaviors: Unable to Assess  Cognitive Functioning Concentration: Unable to Assess Memory: Unable to Assess Is patient IDD: No Insight: Unable to Assess Impulse Control: Unable to Assess Appetite: (UTA) Have you had any weight changes? : (UTA) Sleep: (UTA) Total Hours of Sleep: (UTA) Vegetative Symptoms: (UTA)  ADLScreening [facility] Assessment Services) Patient's cognitive ability adequate to safely complete daily activities?: Yes Patient able to express need for assistance with ADLs?: Yes Independently performs ADLs?: Yes (appropriate for developmental age)  Prior Inpatient Therapy Prior  Inpatient Therapy: Yes Prior Therapy Dates: 2020, 2019,  Prior Therapy Facilty/Provider(s): [facility], [facility],   Reason for Treatment: MH issues  Prior Outpatient Therapy Prior Outpatient Therapy:  Yes Prior Therapy Dates: Ongoing Prior Therapy Facilty/Provider(s): [facility] Reason for Treatment: Med mang Does patient have an ACCT team?: No Does patient have Intensive In-House Services?  : No Does patient have [facility] services? : Yes Does patient have P4CC services?: No  ADL Screening (condition at time of admission) Patient's cognitive ability adequate to safely complete daily activities?: Yes Is the patient deaf or have difficulty hearing?: No Does the patient have difficulty seeing, even when wearing glasses/contacts?: No Does the patient have difficulty concentrating, remembering, or making decisions?: No Patient able to express need for assistance with ADLs?: Yes Does the patient have difficulty dressing or bathing?: No Independently performs ADLs?: Yes (appropriate for developmental age) Does the patient have difficulty walking or climbing stairs?: No Weakness of Legs: None Weakness of Arms/Hands: None  Home Assistive Devices/Equipment Home Assistive Devices/Equipment: None  Therapy Consults (therapy consults require a physician order) PT Evaluation Needed: No OT Evalulation Needed: No SLP Evaluation Needed: No Abuse/Neglect Assessment (Assessment to be complete while patient is alone) Physical Abuse: Denies Verbal Abuse: Denies Sexual Abuse: Denies Exploitation of patient/patient's resources: Denies Self-Neglect: Denies Values / Beliefs Cultural Requests During Hospitalization: None Spiritual Requests During Hospitalization: None Consults Spiritual Care Consult Needed: No Social Work Consult Needed: No Regulatory affairs officer (For Healthcare) Does Patient Have a Medical Advance Directive?: No Would patient like information on creating a medical advance directive?: No - Patient declined          Disposition: Case was staffed with [name] Pate NP who recommended a inpatient admission to assist with stabilization.  Disposition Initial Assessment Completed for this  Encounter: Yes Disposition of Patient: Admit Type of inpatient treatment program: Adult Patient refused recommended treatment: No Mode of transportation if patient is discharged/movement?: Tomasita Crumble)  On Site Evaluation by:   Reviewed with Physician:    Mamie Nick 01/13/2019 10:35 AM

## 2019-01-13 NOTE — ED Provider Notes (Signed)
Emergency Medicine Observation Re-evaluation Note  [name] [name] is a 38 y.o. male, seen on rounds today.  Pt initially presented to the ED for complaints of IVC Currently, the patient is resting comfortably in bed, he awakens to loud voice.  He denies any complaints or concerns.  TTS cart is in the room  Physical Exam  BP 123/84 (BP Location: Left Arm)   Pulse 68   Temp 97.8 F (36.6 C) (Oral)   Resp 16   SpO2 98%  Physical Exam Vitals signs and nursing note reviewed.  Constitutional:      General: He is not in acute distress. Neck:     Musculoskeletal: Normal range of motion.  Pulmonary:     Effort: Pulmonary effort is normal.  Neurological:     General: No focal deficit present.     Mental Status: He is alert.     ED Course / MDM  EKG:  Clinical Course as of Jan 13 1011  Thu Jan 12, 3240  [id number] 38 year old male on an IVC for erratic behavior   [MB]    Clinical Course User Index [MB] Hayden Rasmussen, MD   I have reviewed the labs performed to date as well as medications administered while in observation.  Recent changes since last provider note include TTS attempted to see him twice overnight however he was sleeping and would not wake up to speak with them. Plan  Current plan is for TTS evaluation. Patient is under full IVC at this time.   Lorin Glass, Hershal Coria 01/13/19 1014    Isla Pence, MD 01/13/19 1109

## 2019-01-13 NOTE — ED Notes (Signed)
TTS being done at this time.  

## 2019-01-14 DIAGNOSIS — F259 Schizoaffective disorder, unspecified: Principal | ICD-10-CM

## 2019-01-14 DIAGNOSIS — F19959 Other psychoactive substance use, unspecified with psychoactive substance-induced psychotic disorder, unspecified: Secondary | ICD-10-CM

## 2019-01-14 MED ORDER — ZIPRASIDONE MESYLATE 20 MG IM SOLR
20.0000 mg | INTRAMUSCULAR | Status: AC | PRN
  Administered 2019-01-16: 20 mg via INTRAMUSCULAR
  Filled 2019-01-14: qty 20

## 2019-01-14 MED ORDER — RISPERIDONE 3 MG PO TABS
3.0000 mg | ORAL_TABLET | Freq: Every day | ORAL | Status: DC
  Administered 2019-01-14: 3 mg via ORAL
  Filled 2019-01-14 (×3): qty 1

## 2019-01-14 MED ORDER — RISPERIDONE 1 MG PO TABS: 1.0000 mg | ORAL_TABLET | ORAL | Status: DC

## 2019-01-14 MED ORDER — LORAZEPAM 1 MG PO TABS
1.0000 mg | ORAL_TABLET | ORAL | Status: AC | PRN
  Administered 2019-01-14: 1 mg via ORAL
  Filled 2019-01-14: qty 1

## 2019-01-14 MED ORDER — RISPERIDONE 2 MG PO TABS: 2.0000 mg | ORAL_TABLET | Freq: Every day | ORAL | Status: DC

## 2019-01-14 MED ORDER — OLANZAPINE 5 MG PO TBDP
5.0000 mg | ORAL_TABLET | Freq: Three times a day (TID) | ORAL | Status: DC | PRN
  Filled 2019-01-14: qty 1
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
## 2019-01-14 NOTE — Progress Notes (Signed)
   01/14/19 0847  COVID-19 Daily Checkoff  Have you had a fever (temp > 37.80C/100F)  in the past 24 hours?  No  If you have had runny nose, nasal congestion, sneezing in the past 24 hours, has it worsened? No  COVID-19 EXPOSURE  Have you traveled outside the state in the past 14 days? No  Have you been in contact with someone with a confirmed diagnosis of COVID-19 or PUI in the past 14 days without wearing appropriate PPE? No  Have you been living in the same home as a person with confirmed diagnosis of COVID-19 or a PUI (household contact)? No  Have you been diagnosed with COVID-19? No

## 2019-01-14 NOTE — Progress Notes (Signed)
Did not attend group 
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
## 2019-01-14 NOTE — Progress Notes (Signed)
D:  [name] [name] was up and visible on the unit this evening.  He was minimal with his interactions but pleasant and cooperative.  He denied SI/HI or visual hallucinations but admitted to hearing his daughters voice.  He denied any pain or discomfort and appeared to be in no physical distress. A:  1:1 with RN for support and encouragement.  Medications as ordered.  Q 15 minute checks maintained for safety.  Encouraged participation in group and unit activities.   R:  Kayla remains safe on the unit.  We will continue to monitor the progress towards his goals.

## 2019-01-15 MED ORDER — TRAZODONE HCL 50 MG PO TABS
50.0000 mg | ORAL_TABLET | Freq: Every evening | ORAL | Status: DC | PRN
  Administered 2019-01-15 (×2): 50 mg via ORAL
  Filled 2019-01-15 (×6): qty 1

## 2019-01-15 MED ORDER — RISPERIDONE 2 MG PO TABS
2.0000 mg | ORAL_TABLET | Freq: Two times a day (BID) | ORAL | Status: DC
  Administered 2019-01-15 – 2019-01-16 (×2): 2 mg via ORAL
  Filled 2019-01-15 (×4): qty 1

## 2019-01-15 MED ORDER — HYDROXYZINE HCL 25 MG PO TABS
25.0000 mg | ORAL_TABLET | Freq: Four times a day (QID) | ORAL | Status: DC | PRN
  Filled 2019-01-15: qty 1

## 2019-01-15 MED ORDER — DIPHENHYDRAMINE HCL 50 MG PO CAPS
50.0000 mg | ORAL_CAPSULE | Freq: Once | ORAL | Status: DC
  Filled 2019-01-15: qty 2
  Filled 2019-01-15: qty 1

## 2019-01-15 NOTE — Progress Notes (Addendum)
[facility] MD Progress Note  01/15/2019 9:48 AM [name] [name]  MRN:  [id number]   Subjective:  [name] [name] reported " I am just ready to go home."  Objective: [name] [name] seen resting in bed.  He presents flat, guarded and depressed. Slightly more reactive than on admission.  Denying suicidal or homicidal ideations.  Denies auditory or visual hallucinations.  Patient reports taking his medications as prescribed and tolerating them well. Patient remains focused on discharging soon. Patient presents with mild thought blocking and confusion.  Can be disorganized however redirectable.  Patient remains focused on " missing family and discharging soon."  Reports a good appetite. [name] [name] denies depression or depressive symptoms.  States he is resting well throughout the night.  Support encouragement reassurance was provided.   Principal Problem: Schizoaffective disorder ([place]) Diagnosis: Principal Problem:   Schizoaffective disorder ([place]) Active Problems:   Psychoactive substance-induced psychosis ([place])  Total Time spent with patient: 15 minutes  Past Psychiatric History:   Past Medical History:  Past Medical History:  Diagnosis Date  . Anxiety   . Depression   . Mental disorder   . Schizophrenia ([place])    History reviewed. No pertinent surgical history. Family History:  Family History  Problem Relation Age of Onset  . Mental illness Neg Hx    Family Psychiatric  History:  Social History:  Social History   Substance and Sexual Activity  Alcohol Use Not Currently   Comment: haven't drank in months"     Social History   Substance and Sexual Activity  Drug Use Yes  . 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 Marital status: Married    Spouse name: Not on file  . Number of children: 1  . Years of education: Not on file  . Highest education level: Not on file  Occupational History  . Occupation: Disabled  Social Needs  .  Financial resource strain: Not on file  . Food insecurity    Worry: Not on file    Inability: Not on file  . Transportation needs    Medical: Not on file    Non-medical: Not on file  Tobacco Use  . Smoking status: Current Every Day Smoker    Packs/day: 1.00    Types: Cigarettes  . Smokeless tobacco: Former Network engineer and Sexual Activity  . Alcohol use: Not Currently    Comment: haven't drank in months"  . 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 Sexual activity: Yes    Birth control/protection: None  Lifestyle  . Physical activity    Days per week: Not on file    Minutes per session: Not on file  . Stress: Not on file  Relationships  . Social Herbalist on phone: Not on file    Gets together: Not on file    Attends religious service: Not on file    Active member of club or organization: Not on file    Attends meetings of clubs or organizations: Not on file    Relationship status: Not on file  Other Topics Concern  . Not on file  Social History Narrative   Pt lives with parents and sister.  He is married, but currently separated.  Pt is on disability.   Additional Social History:  Sleep: Fair  Appetite:  Fair  Current Medications: Current Facility-Administered Medications  Medication Dose Route Frequency Provider Last Rate Last Dose  . acetaminophen (TYLENOL) tablet 650 mg  650 mg Oral Q6H PRN Maryagnes AmosStarkes-Perry, Takia S, FNP      . alum & mag hydroxide-simeth (MAALOX/MYLANTA) 200-200-20 MG/5ML suspension 30 mL  30 mL Oral Q4H PRN Starkes-Perry, Juel Burrowakia S, FNP      . benztropine (COGENTIN) tablet 0.5 mg  0.5 mg Oral BID Maryagnes AmosStarkes-Perry, Takia S, FNP   0.5 mg at 01/15/19 0839  . magnesium hydroxide (MILK OF MAGNESIA) suspension 30 mL  30 mL Oral Daily PRN Maryagnes AmosStarkes-Perry, Takia S, FNP      . OLANZapine zydis (ZYPREXA) disintegrating tablet 5 mg  5 mg Oral Q8H PRN Cobos, Rockey SituFernando  A, MD       And  . ziprasidone (GEODON) injection 20 mg  20 mg Intramuscular PRN Cobos, Fernando A, MD      . risperiDONE (RISPERDAL) tablet 3 mg  3 mg Oral QHS Cobos, Rockey SituFernando A, MD   3 mg at 01/14/19 2053    Lab Results: No results found for this or any previous visit (from the past 48 hour(s)).  Blood Alcohol level:  Lab Results  Component Value Date   ETH <10 01/12/2019   ETH <10 01/08/2019    Metabolic Disorder Labs: Lab Results  Component Value Date   HGBA1C 5.3 10/30/2018   MPG 105.41 10/30/2018   MPG 116.89 12/19/2017   Lab Results  Component Value Date   PROLACTIN 45.8 (H) 12/19/2017   PROLACTIN 7.0 04/04/2015   Lab Results  Component Value Date   CHOL 139 10/30/2018   TRIG 77 10/30/2018   HDL 41 10/30/2018   CHOLHDL 3.4 10/30/2018   VLDL 15 10/30/2018   LDLCALC 83 10/30/2018   LDLCALC 111 (H) 12/19/2017    Physical Findings: AIMS: Facial and Oral Movements Muscles of Facial Expression: None, normal Lips and Perioral Area: None, normal Jaw: None, normal Tongue: None, normal,Extremity Movements Upper (arms, wrists, hands, fingers): None, normal Lower (legs, knees, ankles, toes): None, normal, Trunk Movements Neck, shoulders, hips: None, normal, Overall Severity Severity of abnormal movements (highest score from questions above): None, normal Incapacitation due to abnormal movements: None, normal Patient's awareness of abnormal movements (rate only patient's report): No Awareness, Dental Status Current problems with teeth and/or dentures?: No Does patient usually wear dentures?: No  CIWA:  CIWA-Ar Total: 0 COWS:  COWS Total Score: 0  Musculoskeletal: Strength & Muscle Tone: within normal limits Gait & Station: normal Patient leans: N/A  Psychiatric Specialty Exam: Physical Exam  ROS  Blood pressure 108/79, pulse 87, temperature 98 F (36.7 C), temperature source Oral, resp. rate 16, height 5\' 3"  (1.6 m), weight 63.5 kg.Body mass index is 24.8  kg/m.  General Appearance: Guarded  Eye Contact:  Good  Speech:  Clear and Coherent  Volume:  Normal  Mood:  Depressed and Dysphoric hypomanic   Affect:  Blunt, Depressed and Flat  Thought Process:  Descriptions of Associations: Loose  Orientation:  Other:  Person and place  Thought Content:  Hallucinations: Auditory, Paranoid Ideation and Rumination  Suicidal Thoughts:  No  Homicidal Thoughts:  No  Memory:  Immediate;   Fair Recent;   Fair  Judgement:  Fair  Insight:  Lacking  Psychomotor Activity:  Decreased  Concentration:  Concentration: Fair  Recall:  FiservFair  Fund of Knowledge:  Fair  Language:  Fair  Akathisia:  No  Handed:  Right  AIMS (if indicated):     Assets:  Communication Skills Desire for Improvement Resilience Social Support  ADL's:  Intact  Cognition:  WNL  Sleep:  Number of Hours: 2.5     Treatment Plan Summary: Daily contact with patient to assess and evaluate symptoms and progress in treatment and Medication management   Continue her current treatment plan on 01/15/2019 as listed below except were noted   Schizophrenia  Adjusted Risperdal 2 mg po BID  Continue Cogentin 0.5 mg p.o. twice daily Continue trazodone 50 mg p.o. PRN nightly Agitation protocol was initiated see chart  CSW to continue working on discharge disposition Patient encouraged to participate within the therapeutic milieu   Oneta Rackanika N Lewis, NP 01/15/2019, 9:48 AM   Attest to NP Progress Note

## 2019-01-15 NOTE — [facility] Group Notes (Signed)
[place] Group Notes: (Clinical Social Work)   01/15/2019      Type of Therapy:  Group Therapy   Participation Level:  Did Not Attend - was present at the beginning of group, but left almost immediately and returned twice, only to leave again immediately   Selmer Dominion, [place] 01/15/2019, 12:42 PM

## 2019-01-15 NOTE — Progress Notes (Addendum)
S- ASKED TO SEE PT FOR C/O OF PRURITIS RELATED TO  ?INSECT BITES ON LOWER EXTREMETIES MAINLY. ALSO NOTES A PAPULE ON INNER LT BICEPS AREA .TELLS ME LEGS DONT ITCH BUT ARM DOES THEN CHANGES MIND ABOUT ARM ITCHING.  O- MULTIPLE CICTARIZED PAPULAR LESIONS ON BOTH LES BELOW THE KNEES.THERE IS A 1-2 MM PAPULE WITH CENTRAL BLACK SUBSTANCE/?SEED TIC-HE WILL NOT LET ME PALPATE ANY OF HIS LESIONS.  A- LEGS ARE CONSISTENT WITH EXSPOSURE TO cHIGGERS. lESION ON ARM COULD BE SAME BUT POSSIBILITY OF SEED TIC CANNOT BE RULED OUT  P-PT REFUSES TOPICAL MEDICATION.AC SUGGESTS DOSE OF BENADRYL.PT HAS ORDER FOR PRN VISTARIL IN PLACE BUT WILL ADD 1X BENADRYL 50 MG NOW. REQUEST FURTHER ASSESSMENT OF ARM LESION IN AM.

## 2019-01-15 NOTE — Progress Notes (Signed)
D: Patient is labile this morning. When asked questions during assessment, he quickly became agitated. He appears paranoid. He blames his family for his admission. He denies that he stopped taking his medications as they accused him. It is evident that his medications are not sufficiently controlling his agitation, or he did stop taking them. He argues about why a date of birth needs to be verified prior to medication administration. He is medication compliant. He is hyperactive, restless, and pacing the halls. It is not clear how much sleep he got last night, although he states that he slept well. A: Provided support and encouragement. Fluids given. Educated on medications and their side effects. R: Patient continues to pace the hall.
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
## 2019-01-15 NOTE — Progress Notes (Signed)
D: Pt denies SI/HI/AVH. Pt is pleasant and cooperative. Pt paranoid on the unit. Pt visible very watchful on the milieu A: Pt was offered support and encouragement. Pt was given scheduled medications. Pt was encourage to attend groups. Q 15 minute checks were done for safety.  R: Pt is taking medication. Pt has no complaints.Pt receptive to treatment and safety maintained on unit.

## 2019-01-15 NOTE — Progress Notes (Signed)
DAR NOTE: Patient presents with anxious affect and depressed mood. Pt complained of seeing faces of people on the wall in his room, hearing voices and seeing people dying. Pt kept on saying that he needs to leave because it feels better at home than in the hospital. Maintained on routine safety checks.  Medications given as prescribed.  Support and encouragement offered as needed.  Will continue to monitor.

## 2019-01-15 NOTE — Progress Notes (Signed)
   01/15/19 0839  COVID-19 Daily Checkoff  Have you had a fever (temp > 37.80C/100F)  in the past 24 hours?  No  If you have had runny nose, nasal congestion, sneezing in the past 24 hours, has it worsened? No  COVID-19 EXPOSURE  Have you traveled outside the state in the past 14 days? No  Have you been in contact with someone with a confirmed diagnosis of COVID-19 or PUI in the past 14 days without wearing appropriate PPE? No  Have you been living in the same home as a person with confirmed diagnosis of COVID-19 or a PUI (household contact)? No  Have you been diagnosed with COVID-19? No

## 2019-01-16 DIAGNOSIS — F1994 Other psychoactive substance use, unspecified with psychoactive substance-induced mood disorder: Secondary | ICD-10-CM

## 2019-01-16 DIAGNOSIS — F250 Schizoaffective disorder, bipolar type: Secondary | ICD-10-CM

## 2019-01-16 MED ORDER — NICOTINE POLACRILEX 2 MG MT GUM
2.0000 mg | CHEWING_GUM | OROMUCOSAL | Status: DC | PRN
  Administered 2019-01-16: 2 mg via ORAL

## 2019-01-16 MED ORDER — RISPERIDONE 3 MG PO TABS
3.0000 mg | ORAL_TABLET | Freq: Two times a day (BID) | ORAL | Status: DC
  Administered 2019-01-16 – 2019-01-17 (×2): 3 mg via ORAL
  Filled 2019-01-16 (×6): qty 1

## 2019-01-16 MED ORDER — TRAZODONE HCL 100 MG PO TABS
100.0000 mg | ORAL_TABLET | Freq: Every evening | ORAL | Status: DC | PRN
  Filled 2019-01-16 (×7): qty 1

## 2019-01-16 NOTE — Progress Notes (Signed)
[facility] MD Progress Note  01/16/2019 1:23 PM [name] [name]  MRN:  [id number] Subjective:  Per assessment note: [name] [name] an 38 y.o.malethat presents this date with IVC. Patient has a noted history ofschizophreniaand was transported [facility]. Patient has been residing with sister [name] [name] [phone] who initiated IVC. This writer attempted to contact sister unsuccessfully this date. Per notes, sister reports that patienthas been acting very erratically and hearing voices. Sister reportsthat he is not taking his medications and may be using alcohol and crystal methamphetamine Patient well-known to the service due to prior admissions and encounters the above history is reported.  Today he states he is not having auditory hallucinations only hears "normal" voices he denies current thoughts of harming self or harming others.  He denies drug use his drug screen is negative he blames his exacerbation on the fact he is been out of his Risperdal for approximately 1 month.  Principal Problem: Schizoaffective disorder ([place]) Diagnosis: Principal Problem:   Schizoaffective disorder ([place]) Active Problems:   Psychoactive substance-induced psychosis ([place])  Total Time spent with patient: 20 minutes  Past Psychiatric History: as abive  Past Medical History:  Past Medical History:  Diagnosis Date  . Anxiety   . Depression   . Mental disorder   . Schizophrenia ([place])    History reviewed. No pertinent surgical history. Family History:  Family History  Problem Relation Age of Onset  . Mental illness Neg Hx    Family Psychiatric  History: no new data Social History:  Social History   Substance and Sexual Activity  Alcohol Use Not Currently   Comment: haven't drank in months"     Social History   Substance and Sexual Activity  Drug Use Yes  . 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 Marital status:  Married    Spouse name: Not on file  . Number of children: 1  . Years of education: Not on file  . Highest education level: Not on file  Occupational History  . Occupation: Disabled  Social Needs  . Financial resource strain: Not on file  . Food insecurity    Worry: Not on file    Inability: Not on file  . Transportation needs    Medical: Not on file    Non-medical: Not on file  Tobacco Use  . Smoking status: Current Every Day Smoker    Packs/day: 1.00    Types: Cigarettes  . Smokeless tobacco: Former Network engineer and Sexual Activity  . Alcohol use: Not Currently    Comment: haven't drank in months"  . 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 Sexual activity: Yes    Birth control/protection: None  Lifestyle  . Physical activity    Days per week: Not on file    Minutes per session: Not on file  . Stress: Not on file  Relationships  . Social Herbalist on phone: Not on file    Gets together: Not on file    Attends religious service: Not on file    Active member of club or organization: Not on file    Attends meetings of clubs or organizations: Not on file    Relationship status: Not on file  Other Topics Concern  . Not on file  Social History Narrative   Pt lives with parents  and sister.  He is married, but currently separated.  Pt is on disability.   Additional Social History:                         Sleep: Good  Appetite:  Good  Current Medications: Current Facility-Administered Medications  Medication Dose Route Frequency Provider Last Rate Last Dose  . acetaminophen (TYLENOL) tablet 650 mg  650 mg Oral Q6H PRN Maryagnes AmosStarkes-Perry, Takia S, FNP      . alum & mag hydroxide-simeth (MAALOX/MYLANTA) 200-200-20 MG/5ML suspension 30 mL  30 mL Oral Q4H PRN Starkes-Perry, Juel Burrowakia S, FNP      . benztropine (COGENTIN) tablet 0.5 mg  0.5 mg Oral BID Maryagnes AmosStarkes-Perry, Takia S, FNP   0.5 mg at 01/16/19 0816   . diphenhydrAMINE (BENADRYL) capsule 50 mg  50 mg Oral Once Court JoyKober, Charles E, PA-C      . hydrOXYzine (ATARAX/VISTARIL) tablet 25 mg  25 mg Oral Q6H PRN Court JoyKober, Charles E, PA-C      . magnesium hydroxide (MILK OF MAGNESIA) suspension 30 mL  30 mL Oral Daily PRN Rosario AdieStarkes-Perry, Juel Burrowakia S, FNP      . OLANZapine zydis (ZYPREXA) disintegrating tablet 5 mg  5 mg Oral Q8H PRN Cobos, Rockey SituFernando A, MD       And  . ziprasidone (GEODON) injection 20 mg  20 mg Intramuscular PRN Cobos, Rockey SituFernando A, MD      . risperiDONE (RISPERDAL) tablet 3 mg  3 mg Oral BID Malvin JohnsFarah, Suni Jarnagin, MD      . traZODone (DESYREL) tablet 50 mg  50 mg Oral QHS,MR X 1 Oneta RackLewis, Tanika N, NP   50 mg at 01/15/19 2214    Lab Results: No results found for this or any previous visit (from the past 48 hour(s)).  Blood Alcohol level:  Lab Results  Component Value Date   ETH <10 01/12/2019   ETH <10 01/08/2019    Metabolic Disorder Labs: Lab Results  Component Value Date   HGBA1C 5.3 10/30/2018   MPG 105.41 10/30/2018   MPG 116.89 12/19/2017   Lab Results  Component Value Date   PROLACTIN 45.8 (H) 12/19/2017   PROLACTIN 7.0 04/04/2015   Lab Results  Component Value Date   CHOL 139 10/30/2018   TRIG 77 10/30/2018   HDL 41 10/30/2018   CHOLHDL 3.4 10/30/2018   VLDL 15 10/30/2018   LDLCALC 83 10/30/2018   LDLCALC 111 (H) 12/19/2017    Physical Findings: AIMS: Facial and Oral Movements Muscles of Facial Expression: None, normal Lips and Perioral Area: None, normal Jaw: None, normal Tongue: None, normal,Extremity Movements Upper (arms, wrists, hands, fingers): None, normal Lower (legs, knees, ankles, toes): None, normal, Trunk Movements Neck, shoulders, hips: None, normal, Overall Severity Severity of abnormal movements (highest score from questions above): None, normal Incapacitation due to abnormal movements: None, normal Patient's awareness of abnormal movements (rate only patient's report): No Awareness, Dental  Status Current problems with teeth and/or dentures?: No Does patient usually wear dentures?: No  CIWA:  CIWA-Ar Total: 0 COWS:  COWS Total Score: 0  Musculoskeletal: Strength & Muscle Tone: within normal limits Gait & Station: normal Patient leans: N/A  Psychiatric Specialty Exam: Physical Exam  ROS  Blood pressure 105/70, pulse 67, temperature (!) 97.5 F (36.4 C), temperature source Oral, resp. rate 16, height 5\' 3"  (1.6 m), weight 63.5 kg, SpO2 100 %.Body mass index is 24.8 kg/m.  General Appearance: Casual  Eye Contact:  Good  Speech:  Clear and Coherent  Volume:  Normal  Mood:  Anxious and Dysphoric  Affect:  Appropriate and Congruent  Thought Process:  Linear and Descriptions of Associations: Tangential  Orientation:  Full (Time, Place, and Person)  Thought Content:  Logical  Suicidal Thoughts:  No  Homicidal Thoughts:  No  Memory:  NA Immediate;   Good  Judgement:  Good  Insight:  Good  Psychomotor Activity:  Normal  Concentration:  Concentration: Good  Recall:  Good  Fund of Knowledge:  Good  Language:  Good  Akathisia:  Negative  Handed:  Right  AIMS (if indicated):     Assets:  Leisure Time Physical Health  ADL's:  Intact  Cognition:  WNL  Sleep:  Number of Hours: 4     Treatment Plan Summary: Daily contact with patient to assess and evaluate symptoms and progress in treatment, Medication management and Plan Continue current precautions escalate Risperdal continue to monitor for safety no change in precautions or meds otherwise  Malvin JohnsFARAH,Ellianne Gowen, MD 01/16/2019, 1:23 PM

## 2019-01-16 NOTE — Tx Team (Signed)
Interdisciplinary Treatment and Diagnostic Plan Update  01/16/2019 Time of Session: 09:08am Richelle ItoSimon Salsberry MRN: [id number]  Principal Diagnosis: Schizoaffective disorder [facility](HCC)  Secondary Diagnoses: Principal Problem:   Schizoaffective disorder (HCC) Active Problems:   Psychoactive substance-induced psychosis (HCC)   Current Medications:  Current Facility-Administered Medications  Medication Dose Route Frequency Provider Last Rate Last Dose  . acetaminophen (TYLENOL) tablet 650 mg  650 mg Oral Q6H PRN Maryagnes AmosStarkes-Perry, Takia S, FNP      . alum & mag hydroxide-simeth (MAALOX/MYLANTA) 200-200-20 MG/5ML suspension 30 mL  30 mL Oral Q4H PRN Starkes-Perry, Juel Burrowakia S, FNP      . benztropine (COGENTIN) tablet 0.5 mg  0.5 mg Oral BID Maryagnes AmosStarkes-Perry, Takia S, FNP   0.5 mg at 01/16/19 0816  . diphenhydrAMINE (BENADRYL) capsule 50 mg  50 mg Oral Once Court JoyKober, Charles E, PA-C      . hydrOXYzine (ATARAX/VISTARIL) tablet 25 mg  25 mg Oral Q6H PRN Court JoyKober, Charles E, PA-C      . magnesium hydroxide (MILK OF MAGNESIA) suspension 30 mL  30 mL Oral Daily PRN Rosario AdieStarkes-Perry, Juel Burrowakia S, FNP      . OLANZapine zydis (ZYPREXA) disintegrating tablet 5 mg  5 mg Oral Q8H PRN Cobos, Rockey SituFernando A, MD       And  . ziprasidone (GEODON) injection 20 mg  20 mg Intramuscular PRN Cobos, Fernando A, MD      . risperiDONE (RISPERDAL) tablet 2 mg  2 mg Oral BID Oneta RackLewis, Tanika N, NP   2 mg at 01/16/19 0816  . traZODone (DESYREL) tablet 50 mg  50 mg Oral QHS,MR X 1 Oneta RackLewis, Tanika N, NP   50 mg at 01/15/19 2214   PTA Medications: Medications Prior to Admission  Medication Sig Dispense Refill Last Dose  . benztropine (COGENTIN) 0.5 MG tablet Take 1 tablet (0.5 mg total) by mouth 2 (two) times daily. 60 tablet 0   . risperidone (RISPERDAL) 4 MG tablet Take 1 tablet (4 mg total) by mouth at bedtime. For mood control 30 tablet 0   . traZODone (DESYREL) 50 MG tablet Take 50 mg by mouth at bedtime.       Patient Stressors:    Patient Strengths:  Other: UTA  Treatment Modalities: Medication Management, Group therapy, Case management,  1 to 1 session with clinician, Psychoeducation, Recreational therapy.   Physician Treatment Plan for Primary Diagnosis: Schizoaffective disorder (HCC) Long Term Goal(s): Improvement in symptoms so as ready for discharge Improvement in symptoms so as ready for discharge   Short Term Goals: Ability to identify changes in lifestyle to reduce recurrence of condition will improve Ability to demonstrate self-control will improve Ability to maintain clinical measurements within normal limits will improve Compliance with prescribed medications will improve Ability to disclose and discuss suicidal ideas Ability to identify and develop effective coping behaviors will improve Compliance with prescribed medications will improve Ability to identify triggers associated with substance abuse/mental health issues will improve  Medication Management: Evaluate patient's response, side effects, and tolerance of medication regimen.  Therapeutic Interventions: 1 to 1 sessions, Unit Group sessions and Medication administration.  Evaluation of Outcomes: Progressing  Physician Treatment Plan for Secondary Diagnosis: Principal Problem:   Schizoaffective disorder (HCC) Active Problems:   Psychoactive substance-induced psychosis (HCC)  Long Term Goal(s): Improvement in symptoms so as ready for discharge Improvement in symptoms so as ready for discharge   Short Term Goals: Ability to identify changes in lifestyle to reduce recurrence of condition will improve Ability to demonstrate self-control will improve  Ability to maintain clinical measurements within normal limits will improve Compliance with prescribed medications will improve Ability to disclose and discuss suicidal ideas Ability to identify and develop effective coping behaviors will improve Compliance with prescribed medications will improve Ability to  identify triggers associated with substance abuse/mental health issues will improve     Medication Management: Evaluate patient's response, side effects, and tolerance of medication regimen.  Therapeutic Interventions: 1 to 1 sessions, Unit Group sessions and Medication administration.  Evaluation of Outcomes: Progressing   RN Treatment Plan for Primary Diagnosis: Schizoaffective disorder ([place]) Long Term Goal(s): Knowledge of disease and therapeutic regimen to maintain health will improve  Short Term Goals: Ability to participate in decision making will improve, Ability to verbalize feelings will improve, Ability to disclose and discuss suicidal ideas, Ability to identify and develop effective coping behaviors will improve and Compliance with prescribed medications will improve  Medication Management: RN will administer medications as ordered by provider, will assess and evaluate patient's response and provide education to patient for prescribed medication. RN will report any adverse and/or side effects to prescribing provider.  Therapeutic Interventions: 1 on 1 counseling sessions, Psychoeducation, Medication administration, Evaluate responses to treatment, Monitor vital signs and CBGs as ordered, Perform/monitor CIWA, COWS, AIMS and Fall Risk screenings as ordered, Perform wound care treatments as ordered.  Evaluation of Outcomes: Progressing   LCSW Treatment Plan for Primary Diagnosis: Schizoaffective disorder ([place]) Long Term Goal(s): Safe transition to appropriate next level of care at discharge, Engage patient in therapeutic group addressing interpersonal concerns.  Short Term Goals: Engage patient in aftercare planning with referrals and resources and Increase skills for wellness and recovery  Therapeutic Interventions: Assess for all discharge needs, 1 to 1 time with Social worker, Explore available resources and support systems, Assess for adequacy in community support network,  Educate family and significant other(s) on suicide prevention, Complete Psychosocial Assessment, Interpersonal group therapy.  Evaluation of Outcomes: Progressing   Progress in Treatment: Attending groups: No. Participating in groups: No. Taking medication as prescribed: Yes. Toleration medication: Yes. Family/Significant other contact made: No, will contact:  pt's mother or father Patient understands diagnosis: Yes. Discussing patient identified problems/goals with staff: Yes. Medical problems stabilized or resolved: Yes. Denies suicidal/homicidal ideation: Yes. Issues/concerns per patient self-inventory: No. Other:   New problem(s) identified: No, Describe:  None  New Short Term/Long Term Goal(s): Medication stabilization, elimination of SI thoughts, and development of a comprehensive mental wellness plan.   Patient Goals:  "Get my medications and get my prescriptions at [facility]"  Discharge Plan or Barriers: CSW will continue to follow up for appropriate referrals and possible discharge planning  Reason for Continuation of Hospitalization: Aggression Delusions  Medication stabilization  Estimated Length of Stay: 2-3 days  Attendees: Patient: 01/16/2019   Physician: Dr. Johnn Hai, MD 01/16/2019  Nursing: Sharl Ma, RN  01/16/2019   RN Care Manager: 01/16/2019  Social Worker: Ardelle Anton, LCSW 01/16/2019   Recreational Therapist:  01/16/2019   Other:  01/16/2019   Other:  01/16/2019   Other: 01/16/2019     Scribe for Treatment Team: Trecia Rogers, LCSW 01/16/2019 10:06 AM

## 2019-01-16 NOTE — Progress Notes (Addendum)
  This evening the pt became agitated because he wanted to leave. The pt stated, "let me the fuck out of here. I am going to kill myself." Pt standing in the hallway, angry, yelling and cursing. Pt stated, "I need to get out. I lost my daughter. My first daughter died and I am not going to let my second one die.I thought [name] was black. This country is not doing a good job." The pt continued to yell and curse as he paced the hallway. The pt was given IM Geodon for agitation.   Initial approach. Pt presents with a flat affect. The pt appears paranoid and endorses AVH. The pt is unable to describe the AVHs although he admitted to having hallucinations.The pt has minimal interaction on the unit and forwards little information. The pt expressed that he's ready to return home.   Orders reviewed with pt. Verbal support provided. Pt encouraged to attend groups. 15 minute checks performed for safety.   Pt compliant with tx plan.

## 2019-01-16 NOTE — Progress Notes (Signed)
Recreation Therapy Notes  Date: 7.13.20 Time: 1013 Location: 500 Hall Dayroom  Group Topic: Coping Skills  Goal Area(s) Addresses:  Patient will identify positive coping skills. Patient will identify benefits of using coping skills post d/c.  Behavioral Response: Minimal  Intervention:  [employer], dry erase marker, worksheet  Activity:  Mind Map.  Patients and LRT filled out first 8 boxes together with anger, substance abuse, being sad, anxiety, verbal abuse, torture and finances.  Patients will then come up with at least three coping skills per situation.  LRT will then write the coping skills on the board so patience can fill in the blank spots on their worksheets.  Education: Radiographer, therapeutic, Dentist.   Education Outcome: Acknowledges understanding/In group clarification offered/Needs additional education.   Clinical Observations/Feedback:  Pt arrived late to group.  Pt was quiet but engaged minimally.  Pt identified coping skills as sing, listen to good music, work, visit family and follow 12 steps.    Victorino Sparrow, LRT/CTRS     Ria Comment, Rein Popov A 01/16/2019 10:58 AM

## 2019-01-16 NOTE — Progress Notes (Signed)
D: Pt denies SI/HI/AVH. Pt is pleasant and cooperative. Pt has been in room majority of the evening.   A: Pt was offered support and encouragement. Pt was given scheduled medications. Pt was encourage to attend groups. Q 15 minute checks were done for safety.   R: safety maintained on unit.

## 2019-01-17 ENCOUNTER — Observation Stay (HOSPITAL_COMMUNITY)
Admission: RE | Admit: 2019-01-17 | Discharge: 2019-01-18 | Disposition: A | Discharge status: Home / Self Care | Payer: Medicare Other | Attending: Psychiatry | Admitting: Psychiatry

## 2019-01-17 ENCOUNTER — Encounter (HOSPITAL_COMMUNITY): Payer: Self-pay

## 2019-01-17 ENCOUNTER — Other Ambulatory Visit: Payer: Self-pay

## 2019-01-17 DIAGNOSIS — F209 Schizophrenia, unspecified: Secondary | ICD-10-CM | POA: Diagnosis present

## 2019-01-17 DIAGNOSIS — F419 Anxiety disorder, unspecified: Secondary | ICD-10-CM | POA: Diagnosis not present

## 2019-01-17 DIAGNOSIS — Z79899 Other long term (current) drug therapy: Secondary | ICD-10-CM | POA: Insufficient documentation

## 2019-01-17 DIAGNOSIS — F17210 Nicotine dependence, cigarettes, uncomplicated: Secondary | ICD-10-CM | POA: Diagnosis not present

## 2019-01-17 DIAGNOSIS — F329 Major depressive disorder, single episode, unspecified: Secondary | ICD-10-CM | POA: Diagnosis not present

## 2019-01-17 DIAGNOSIS — F200 Paranoid schizophrenia: Secondary | ICD-10-CM

## 2019-01-17 DIAGNOSIS — F259 Schizoaffective disorder, unspecified: Secondary | ICD-10-CM | POA: Diagnosis not present

## 2019-01-17 MED ORDER — ZIPRASIDONE MESYLATE 20 MG IM SOLR: 20.0000 mg | Freq: Once | INTRAMUSCULAR | Status: DC

## 2019-01-17 MED ORDER — TRAZODONE HCL 100 MG PO TABS
100.0000 mg | ORAL_TABLET | Freq: Every day | ORAL | Status: DC
  Administered 2019-01-17: 100 mg via ORAL
  Filled 2019-01-17: qty 1

## 2019-01-17 MED ORDER — LORAZEPAM 1 MG PO TABS: 1.0000 mg | ORAL_TABLET | ORAL | Status: DC | PRN

## 2019-01-17 MED ORDER — RISPERIDONE 3 MG PO TABS
6.0000 mg | ORAL_TABLET | Freq: Every day | ORAL | Status: DC
  Administered 2019-01-17: 6 mg via ORAL
  Filled 2019-01-17: qty 2

## 2019-01-17 MED ORDER — LORAZEPAM 1 MG PO TABS
1.0000 mg | ORAL_TABLET | Freq: Once | ORAL | Status: AC
  Administered 2019-01-17: 1 mg via ORAL
  Filled 2019-01-17: qty 1

## 2019-01-17 MED ORDER — RISPERIDONE 2 MG PO TBDP: 2.0000 mg | ORAL_TABLET | Freq: Three times a day (TID) | ORAL | Status: DC | PRN

## 2019-01-17 MED ORDER — BENZTROPINE MESYLATE 1 MG PO TABS
1.0000 mg | ORAL_TABLET | Freq: Two times a day (BID) | ORAL | Status: DC
  Administered 2019-01-17 – 2019-01-18 (×2): 1 mg via ORAL
  Filled 2019-01-17: qty 1

## 2019-01-17 MED ORDER — ALUM & MAG HYDROXIDE-SIMETH 200-200-20 MG/5ML PO SUSP: 30.0000 mL | ORAL | Status: DC | PRN

## 2019-01-17 MED ORDER — ACETAMINOPHEN 325 MG PO TABS: 650.0000 mg | ORAL_TABLET | Freq: Four times a day (QID) | ORAL | Status: DC | PRN

## 2019-01-17 MED ORDER — RISPERIDONE 3 MG PO TABS: 6.0000 mg | ORAL_TABLET | Freq: Every day | ORAL | 1 refills | Status: DC

## 2019-01-17 MED ORDER — ZIPRASIDONE MESYLATE 20 MG IM SOLR: 20.0000 mg | INTRAMUSCULAR | Status: DC | PRN

## 2019-01-17 MED ORDER — TRAZODONE HCL 100 MG PO TABS: 100.0000 mg | ORAL_TABLET | Freq: Every day | ORAL | 1 refills | Status: DC

## 2019-01-17 MED ORDER — BENZTROPINE MESYLATE 1 MG PO TABS: 1.0000 mg | ORAL_TABLET | Freq: Two times a day (BID) | ORAL | 1 refills | Status: DC

## 2019-01-17 MED ORDER — LORAZEPAM 2 MG/ML IJ SOLN: 1.0000 mg | Freq: Once | INTRAMUSCULAR | Status: AC

## 2019-01-17 NOTE — [facility] Group Notes (Signed)
[facility] LCSW Group Therapy Note  Date/Time: 01/17/2019 @ 11:00am  Type of Therapy and Topic:  Group Therapy:  Overcoming Obstacles  Participation Level:  Active  Description of Group:    In this group patients will be encouraged to explore what they see as obstacles to their own wellness and recovery. They will be guided to discuss their thoughts, feelings, and behaviors related to these obstacles. The group will process together ways to cope with barriers, with attention given to specific choices patients can make. Each patient will be challenged to identify changes they are motivated to make in order to overcome their obstacles. This group will be process-oriented, with patients participating in exploration of their own experiences as well as giving and receiving support and challenge from other group members.  Therapeutic Goals: 1. Patient will identify personal and current obstacles as they relate to admission. 2. Patient will identify barriers that currently interfere with their wellness or overcoming obstacles.  3. Patient will identify feelings, thought process and behaviors related to these barriers. 4. Patient will identify two changes they are willing to make to overcome these obstacles:    Summary of Patient Progress   Patient was engaged and active throughout group therapy. Pt was able to identify his current and personal obstacles which are: be where he needs to be and drugs. Pt was able to identify barriers that are currently interfering with overcoming his obstacles which is his diagnosis of schizophrenia. Pt was able to identify changes that he is willing to make in order to overcome his obstacles which is: going to church (singing and praying helps with the voices in his head), getting a job and working out.     Therapeutic Modalities:   Cognitive Behavioral Therapy Solution Focused Therapy Motivational Interviewing Relapse Prevention Therapy   Ardelle Anton, LCSW

## 2019-01-17 NOTE — Progress Notes (Signed)
Patient ID: [name] [name], male   DOB: [birth year], 38 y.o.   MRN: [id number]   [place] NOVEL CORONAVIRUS (COVID-19) DAILY CHECK-OFF SYMPTOMS - answer yes or no to each - every day NO YES  Have you had a fever in the past 24 hours?  . Fever (Temp > 37.80C / 100F) X   Have you had any of these symptoms in the past 24 hours? . New Cough .  Sore Throat  .  Shortness of Breath .  Difficulty Breathing .  Unexplained Body Aches   X   Have you had any one of these symptoms in the past 24 hours not related to allergies?   . Runny Nose .  Nasal Congestion .  Sneezing   X   If you have had runny nose, nasal congestion, sneezing in the past 24 hours, has it worsened?  X   EXPOSURES - check yes or no X   Have you traveled outside the state in the past 14 days?  X   Have you been in contact with someone with a confirmed diagnosis of COVID-19 or PUI in the past 14 days without wearing appropriate PPE?  X   Have you been living in the same home as a person with confirmed diagnosis of COVID-19 or a PUI (household contact)?    X   Have you been diagnosed with COVID-19?    X              What to do next: Answered NO to all: Answered YES to anything:   Proceed with unit schedule Follow the [facility] Inpatient Flowsheet.

## 2019-01-17 NOTE — Progress Notes (Signed)
Recreation Therapy Notes  Date: 7.14.20 Time: 1012 Location: 500 Hall Dayroom  Group Topic: Anxiety  Goal Area(s) Addresses:  Patient will identify what makes them anxious. Patient will identify coping skills for anxiety.  Intervention: Worksheet  Activity: Introduction to Anxiety.  Patients were to identify what causes anxiety, physical symptoms they experience, thoughts they experience and coping skills they use for anxiety.  Education: Wellness, Discharge Planning.   Education Outcome: Acknowledges education/In group clarification offered/Needs additional education.   Clinical Observations/Feedback:  Pt did not attend group.    Yaasir Menken, LRT/CTRS         [name] [name] A 01/17/2019 11:23 AM 

## 2019-01-17 NOTE — Progress Notes (Signed)
Short NOVEL CORONAVIRUS (COVID-19) DAILY CHECK-OFF SYMPTOMS - answer yes or no to each - every day NO YES  Have you had a fever in the past 24 hours?  . Fever (Temp > 37.80C / 100F) X   Have you had any of these symptoms in the past 24 hours? . New Cough .  Sore Throat  .  Shortness of Breath .  Difficulty Breathing .  Unexplained Body Aches   X   Have you had any one of these symptoms in the past 24 hours not related to allergies?   . Runny Nose .  Nasal Congestion .  Sneezing   X   If you have had runny nose, nasal congestion, sneezing in the past 24 hours, has it worsened?  X   EXPOSURES - check yes or no X   Have you traveled outside the state in the past 14 days?  X   Have you been in contact with someone with a confirmed diagnosis of COVID-19 or PUI in the past 14 days without wearing appropriate PPE?  X   Have you been living in the same home as a person with confirmed diagnosis of COVID-19 or a PUI (household contact)?    X   Have you been diagnosed with COVID-19?    X              What to do next: Answered NO to all: Answered YES to anything:   Proceed with unit schedule Follow the [facility] Inpatient Flowsheet.   

## 2019-01-17 NOTE — Progress Notes (Signed)
  [facility] Adult Case Management Discharge Plan :  Will you be returning to the same living situation after discharge:  Yes,  with mom and dad At discharge, do you have transportation home?: Yes,  pt's mother Do you have the ability to pay for your medications: Yes,  medicare  Release of information consent forms completed and in the chart;  Patient's signature needed at discharge.  Patient to Follow up at: Follow-up Information    [facility] Follow up on 01/25/2019.   Why: Telephonic hospital follow up appointmnet with Dr. Margarito Liner is Wednesday, 7/22 at 10:00a. Please ak your provider about group and individual therapy.  The provider will contact you the day of the appointment.  Contact information: [address] [place] [address] [phone]           Next level of care provider has access to [place] and Suicide Prevention discussed: Yes,  pt's sister  Have you used any form of tobacco in the last 30 days? (Cigarettes, Smokeless Tobacco, Cigars, and/or Pipes): Yes  Has patient been referred to the Quitline?: Patient refused referral  Patient has been referred for addiction treatment: Yes  Trecia Rogers, LCSW 01/17/2019, 12:52 PM
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
## 2019-01-17 NOTE — Progress Notes (Signed)
Patient ID: [name] [name], male   DOB: 1981-03-05, 38 y.o.   MRN: [id number]  D: Pt alert and oriented on the unit.   A: Education, support, and encouragement provided. Discharge summary, medications and follow up appointments reviewed with pt. Suicide prevention resources provided, including "My 3 App." Pt's belongings in locker # 98 returned and belongings sheet signed.  R: Pt denies SI/HI, A/VH, pain, or any concerns at this time. Pt ambulatory on and off unit. Pt discharged to lobby.

## 2019-01-17 NOTE — BHH Suicide Risk Assessment (Signed)
[facility] Discharge Suicide Risk Assessment   Principal Problem: Schizoaffective disorder [facility]) Discharge Diagnoses: Principal Problem:   Schizoaffective disorder ([place]) Active Problems:   Psychoactive substance-induced psychosis ([place])   Total Time spent with patient: 45 minutes  Musculoskeletal: Strength & Muscle Tone: within normal limits Gait & Station: normal Patient leans: N/A  Psychiatric Specialty Exam: ROS  Blood pressure 119/79, pulse 70, temperature 98.6 F (37 C), temperature source Oral, resp. rate 16, height 5\' 3"  (1.6 m), weight 63.5 kg, SpO2 100 %.Body mass index is 24.8 kg/m.  General Appearance: Casual  Eye Contact::  Good  Speech:  Clear and Coherent409  Volume:  Normal  Mood:  Euthymic  Affect:  Congruent  Thought Process:  Coherent and Descriptions of Associations: Intact  Orientation:  Full (Time, Place, and Person)  Thought Content:  Logical  Suicidal Thoughts:  No  Homicidal Thoughts:  No  Memory:  Recent;   Good  Judgement:  Good  Insight:  Good  Psychomotor Activity:  Normal  Concentration:  Good  Recall:  Good  Fund of Knowledge:Good  Language: Good  Akathisia:  Negative  Handed:  Right  AIMS (if indicated):     Assets:  Communication Skills Desire for Improvement Physical Health Resilience Social Support  Sleep:  Number of Hours: 6.75  Cognition: WNL  ADL's:  Intact   Mental Status Per Nursing Assessment::   On Admission:  Self-harm behaviors  Demographic Factors:  Living alone  Loss Factors: NA  Historical Factors: NA  Risk Reduction Factors:   Responsible for children under 37 years of age, Sense of responsibility to family and Religious beliefs about death  Continued Clinical Symptoms:  Alcohol/Substance Abuse/Dependencies  Cognitive Features That Contribute To Risk:  None    Suicide Risk:  Minimal: No identifiable suicidal ideation.  Patients presenting with no risk factors but with morbid ruminations; may be  classified as minimal risk based on the severity of the depressive symptoms  Follow-up Information    [facility] Follow up on 01/25/2019.   Why: Telephonic hospital follow up appointmnet with Dr. Margarito Liner is Wednesday, 7/22 at 10:00a. Please ak your provider about group and individual therapy.  The provider will contact you the day of the appointment.  Contact information: [address] [place] [address] [phone]           Plan Of Care/Follow-up recommendations:  Activity:  full  Markes Shatswell, MD 01/17/2019, 10:39 AM
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
## 2019-01-17 NOTE — BHH Suicide Risk Assessment (Signed)
[place] INPATIENT:  Family/Significant Other Suicide Prevention Education  Suicide Prevention Education:  Education Completed; Pt's sister, Rylen Swindler, has been identified by the patient as the family member/significant other with whom the patient will be residing, and identified as the person(s) who will aid the patient in the event of a mental health crisis (suicidal ideations/suicide attempt).  With written consent from the patient, the family member/significant other has been provided the following suicide prevention education, prior to the and/or following the discharge of the patient.  The suicide prevention education provided includes the following:  Suicide risk factors  Suicide prevention and interventions  National Suicide Hotline telephone number  [facility] assessment telephone number  [facility] Emergency Assistance Summit and/or Residential Mobile Crisis Unit telephone number  Request made of family/significant other to:  Remove weapons (e.g., guns, rifles, knives), all items previously/currently identified as safety concern.    Remove drugs/medications (over-the-counter, prescriptions, illicit drugs), all items previously/currently identified as a safety concern.  The family member/significant other verbalizes understanding of the suicide prevention education information provided.  The family member/significant other agrees to remove the items of safety concern listed above.  CSW contacted pt's sister, Fowler Antos. Pt's father was contacted but due to language barrier, pt's sister was contacted. Pt's sister stated that she does not have any questions or concerns. She states that the patient can go stay with their parents because he may not have any other options.   Trecia Rogers 01/17/2019, 10:48 AM
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
## 2019-01-18 ENCOUNTER — Encounter (HOSPITAL_COMMUNITY): Payer: Self-pay | Admitting: Registered Nurse

## 2019-01-18 DIAGNOSIS — Z79899 Other long term (current) drug therapy: Secondary | ICD-10-CM | POA: Diagnosis not present

## 2019-01-18 DIAGNOSIS — F250 Schizoaffective disorder, bipolar type: Secondary | ICD-10-CM | POA: Diagnosis not present

## 2019-01-18 DIAGNOSIS — F17210 Nicotine dependence, cigarettes, uncomplicated: Secondary | ICD-10-CM | POA: Diagnosis not present

## 2019-01-18 DIAGNOSIS — F329 Major depressive disorder, single episode, unspecified: Secondary | ICD-10-CM | POA: Diagnosis not present

## 2019-01-18 DIAGNOSIS — F259 Schizoaffective disorder, unspecified: Secondary | ICD-10-CM | POA: Diagnosis not present

## 2019-01-18 DIAGNOSIS — F419 Anxiety disorder, unspecified: Secondary | ICD-10-CM | POA: Diagnosis not present

## 2019-01-18 LAB — ETHANOL: Alcohol, Ethyl (B): <10 mg/dL (ref <10)

## 2019-01-18 LAB — RAPID URINE DRUG SCREEN, HOSP PERFORMED
Amphetamines: NOT DETECTED
Barbiturates: NOT DETECTED
Benzodiazepines: NOT DETECTED
Cocaine: NOT DETECTED
Opiates: NOT DETECTED
Tetrahydrocannabinol: NOT DETECTED

## 2019-01-18 NOTE — Progress Notes (Signed)
D: Patient denies SI, HI or AVH this evening although he appears to be responding to internal stimuli. Patient presents as animated and anxious but pleasant and cooperative.  Pt. Given a bible per his request and spent some time on the phone.  A: Patient given emotional support from RN. Patient encouraged to come to staff with concerns and/or questions. Patient's medication routine continued. Patient's orders and plan of care reviewed.   R: Patient remains appropriate and cooperative. Will continue to monitor patient q15 minutes for safety.

## 2019-01-18 NOTE — BH Assessment (Signed)
[facility] Assessment Progress Note  Per Buford Dresser, DO, this pt does not require psychiatric hospitalization at this time.  Pt presents under IVC initiated by pt's sister, which Dr Mariea Clonts has rescinded.  Pt is to be discharged from [facility].  Following recent admission to [facility], pt was scheduled for a telephonic hospital follow up appointment with Dr Margarito Liner on Wednesday, 01/25/2019 at 10:00.  This has once again been included in pt's discharge instructions.  Pt's nurse has been notified.  Jalene Mullet, [place] Triage Specialist [phone]
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
## 2019-01-18 NOTE — Discharge Instructions (Signed)
For your behavioral health needs, you are advised to follow up with [facility].  You have a telephonic hospital follow up appointment scheduled with Dr Margarito Liner on Wednesday, January 25, 2019 at 10:00 am:       [facility]      [address]      [place], [place] [address]      [phone]      Crisis number: [phone]
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
## 2019-01-30 DIAGNOSIS — F19959 Other psychoactive substance use, unspecified with psychoactive substance-induced psychotic disorder, unspecified: Secondary | ICD-10-CM | POA: Diagnosis not present
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
## 2019-01-31 ENCOUNTER — Encounter (HOSPITAL_COMMUNITY): Payer: Self-pay | Admitting: Emergency Medicine

## 2019-01-31 ENCOUNTER — Observation Stay (HOSPITAL_COMMUNITY)
Admission: RE | Admit: 2019-01-31 | Discharge: 2019-01-31 | Disposition: A | Discharge status: Home / Self Care | Payer: Medicare Other | Attending: Psychiatry | Admitting: Psychiatry

## 2019-01-31 ENCOUNTER — Other Ambulatory Visit: Payer: Self-pay

## 2019-01-31 DIAGNOSIS — G4700 Insomnia, unspecified: Secondary | ICD-10-CM | POA: Diagnosis not present

## 2019-01-31 DIAGNOSIS — F329 Major depressive disorder, single episode, unspecified: Secondary | ICD-10-CM | POA: Diagnosis not present

## 2019-01-31 DIAGNOSIS — F419 Anxiety disorder, unspecified: Secondary | ICD-10-CM | POA: Insufficient documentation

## 2019-01-31 DIAGNOSIS — Z79899 Other long term (current) drug therapy: Secondary | ICD-10-CM | POA: Insufficient documentation

## 2019-01-31 DIAGNOSIS — R45851 Suicidal ideations: Secondary | ICD-10-CM | POA: Insufficient documentation

## 2019-01-31 DIAGNOSIS — Z1159 Encounter for screening for other viral diseases: Secondary | ICD-10-CM | POA: Insufficient documentation

## 2019-01-31 DIAGNOSIS — F259 Schizoaffective disorder, unspecified: Secondary | ICD-10-CM | POA: Diagnosis not present

## 2019-01-31 DIAGNOSIS — F19959 Other psychoactive substance use, unspecified with psychoactive substance-induced psychotic disorder, unspecified: Principal | ICD-10-CM | POA: Diagnosis present

## 2019-01-31 LAB — RAPID URINE DRUG SCREEN, HOSP PERFORMED
Amphetamines: POSITIVE — AB
Barbiturates: NOT DETECTED
Benzodiazepines: NOT DETECTED
Cocaine: NOT DETECTED
Opiates: NOT DETECTED
Tetrahydrocannabinol: NOT DETECTED

## 2019-01-31 LAB — SARS CORONAVIRUS 2 BY RT PCR (HOSPITAL ORDER, PERFORMED IN ~~LOC~~ HOSPITAL LAB): SARS Coronavirus 2: NEGATIVE

## 2019-01-31 MED ORDER — RISPERIDONE 3 MG PO TABS: 6.0000 mg | ORAL_TABLET | Freq: Every day | ORAL | Status: DC

## 2019-01-31 MED ORDER — TRAZODONE HCL 100 MG PO TABS: 100.0000 mg | ORAL_TABLET | Freq: Every day | ORAL | Status: DC

## 2019-01-31 MED ORDER — BENZTROPINE MESYLATE 1 MG PO TABS
1.0000 mg | ORAL_TABLET | Freq: Two times a day (BID) | ORAL | Status: DC
  Administered 2019-01-31 (×2): 1 mg via ORAL
  Filled 2019-01-31 (×2): qty 1

## 2019-01-31 MED ORDER — MAGNESIUM HYDROXIDE 400 MG/5ML PO SUSP: 30.0000 mL | Freq: Every day | ORAL | Status: DC | PRN

## 2019-01-31 MED ORDER — ALUM & MAG HYDROXIDE-SIMETH 200-200-20 MG/5ML PO SUSP: 30.0000 mL | ORAL | Status: DC | PRN

## 2019-01-31 MED ORDER — ACETAMINOPHEN 325 MG PO TABS: 650.0000 mg | ORAL_TABLET | Freq: Four times a day (QID) | ORAL | Status: DC | PRN
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
## 2019-01-31 NOTE — Progress Notes (Signed)
Nursing Note: 0700-1900  D:  Pt present with anxious mood  States that he has no one to talk to at home and complains of feeling a poking sensation in buttock area.  " I feel someone is poking me in the hole, back and forth." Pt admits to recent methamphetamine use and not taking his prescribed medicines.  A:  Encouraged to verbalize needs and concerns, active listening and support provided.  Continued Q 15 minute safety checks.   R:  Pt. speaks in rapid, tangential, disorganized pattern.  Denies A/V hallucinations and is able to verbally contract for safety.

## 2019-01-31 NOTE — Plan of Care (Signed)
[name] [name]  Reason for Crisis Plan:  [place] Observation  Plan of Care:  Crisis Stabilization  Family Support:      Current Living Environment:  Living Arrangements: Parent  Insurance:   Hospital Account    Name Acct ID Class Status Primary Coverage   [name] [name] [id number] [place] - MEDICARE PART A AND B        Guarantor Account (for Hospital Account [id number])    Name Relation to Pt Service Area Active? Acct Type   [name] [name] Self [facility]   Address Phone       [address] [place], [place] [address] [phone])          Coverage Information (for Hospital Account [id number])    1. [name] PART A AND B    F/O Payor/Plan Precert #   MEDICARE/MEDICARE PART A AND B    Subscriber Subscriber #   [name] [name], [name] [name] [id number]   Address Phone   [address] [place], [place] [address]        2. [place]    F/O Payor/Plan Precert #   [name] MEDICAID/[place]    Subscriber Subscriber #   [name] [name], [name] [name] [id number]   Address Phone   [address] [place],  [address] [phone]          Legal Guardian:  Legal Guardian: (self)  Primary Care Provider:  Patient, No Pcp Per  Current Outpatient Providers:  Buford Dresser, DO  Psychiatrist:  Name of Psychiatrist: none  Counselor/Therapist:  Name of Therapist: none  Compliant with Medications:  no  Additional Information:   [name] [name] 7/28/20202:01 AM

## 2019-01-31 NOTE — Progress Notes (Signed)
Report given to Harl Bowie, RN at [facility]. Call placed to [facility] for transfer, estimate for arrival is between 7:30-8pm.

## 2019-01-31 NOTE — Discharge Summary (Addendum)
  [facility] to be [facility] to [facility] for [facility]  Patient seen face-to-face for psychiatric evaluation, chart reviewed and case discussed with the physician extender and developed treatment plan. Reviewed the information documented and agree with the treatment plan.  Buford Dresser, DO 01/31/19 5:08 PM

## 2019-01-31 NOTE — Progress Notes (Signed)
D: Patient verbalizes readiness for discharge/transfer.  Denies suicidal and homicidal ideations. Denies auditory and visual hallucinations.  No complaints of pain.  A:  Pt receptive to transfer instructions. Questions encouraged, pt verbalize understanding. Paperwork given to [employer], Delight Ovens  R:  Escorted to the lobby by this RN.

## 2019-01-31 NOTE — Progress Notes (Signed)
[place] NOVEL CORONAVIRUS (COVID-19) DAILY CHECK-OFF SYMPTOMS - answer yes or no to each - every day NO YES  Have you had a fever in the past 24 hours?  . Fever (Temp > 37.80C / 100F) X   Have you had any of these symptoms in the past 24 hours? . New Cough .  Sore Throat  .  Shortness of Breath .  Difficulty Breathing .  Unexplained Body Aches   X   Have you had any one of these symptoms in the past 24 hours not related to allergies?   . Runny Nose .  Nasal Congestion .  Sneezing   X   If you have had runny nose, nasal congestion, sneezing in the past 24 hours, has it worsened?  X   EXPOSURES - check yes or no X   Have you traveled outside the state in the past 14 days?  X   Have you been in contact with someone with a confirmed diagnosis of COVID-19 or PUI in the past 14 days without wearing appropriate PPE?  X   Have you been living in the same home as a person with confirmed diagnosis of COVID-19 or a PUI (household contact)?    X   Have you been diagnosed with COVID-19?    X              What to do next: Answered NO to all: Answered YES to anything:   Proceed with unit schedule Follow the [facility] Inpatient Flowsheet.   

## 2019-01-31 NOTE — H&P (Signed)
Behavioral Health Medical Screening Exam  [name] [name] is an 38 y.o. male.   Psychiatric Specialty Exam: Physical Exam  Constitutional: He is oriented to person, place, and time. He appears well-developed and well-nourished. No distress.  HENT:  Head: Normocephalic and atraumatic.  Right Ear: External ear normal.  Left Ear: External ear normal.  Eyes: Pupils are equal, round, and reactive to light. Right eye exhibits no discharge. Left eye exhibits no discharge. No scleral icterus.  Respiratory: Effort normal. No respiratory distress.  Musculoskeletal: Normal range of motion.  Neurological: He is alert and oriented to person, place, and time.  Skin: He is not diaphoretic.  Psychiatric: He is not withdrawn and not actively hallucinating. He exhibits a depressed mood. He expresses suicidal ideation.    Review of Systems  Constitutional: Negative for chills, diaphoresis, fever, malaise/fatigue and weight loss.  Respiratory: Negative for cough and shortness of breath.   Cardiovascular: Negative for chest pain.  Gastrointestinal: Negative for diarrhea, nausea and vomiting.  Psychiatric/Behavioral: Positive for depression, hallucinations, substance abuse and suicidal ideas. Negative for memory loss. The patient is nervous/anxious and has insomnia.     Blood pressure 127/86, pulse 92, temperature 98.4 F (36.9 C), temperature source Oral, resp. rate 20, SpO2 98 %.There is no height or weight on file to calculate BMI.  General Appearance: Disheveled  Eye Contact:  Good  Speech:  Pressured  Volume:  Normal  Mood:  Anxious and Depressed  Affect:  Congruent  Thought Process:  Disorganized  Orientation:  Full (Time, Place, and Person)  Thought Content:  Hallucinations: Auditory  Suicidal Thoughts:  Yes.  without intent/plan  Homicidal Thoughts:  Yes.  without intent/plan  Memory:  Immediate;   Fair Recent;   Fair  Judgement:  Impaired  Insight:  Lacking  Psychomotor Activity:   Increased  Concentration:  Concentration: Fair and Attention Span: Fair  Recall:  [employer] of Knowledge:  Fair  Language:  Good  Akathisia:  Negative  Handed:  Right  AIMS (if indicated):     Assets:  Agricultural consultant Leisure Time Physical Health  ADL's:  Intact  Cognition:  WNL  Sleep:        Blood pressure 127/86, pulse 92, temperature 98.4 F (36.9 C), temperature source Oral, resp. rate 20, SpO2 98 %.  Recommendations:  Based on my evaluation the patient does not appear to have an emergency medical condition.  Rozetta Nunnery, NP 01/31/2019, 2:48 AM

## 2019-01-31 NOTE — BH Assessment (Signed)
[facility] Assessment Progress Note  Per Buford Dresser, DO, this pt requires psychiatric hospitalization at this time.  Pt presents under IVC initiated by pt's sister, which Dr Mariea Clonts has upheld.  At 15:38 Roderic Palau calls from [facility] to report that pt has been accepted to their facility by Dr Dareen Piano to [place].  Dr Mariea Clonts concurs with this decision.  Pt's nurse, Freda Munro, has been notified, and agrees to call report to [phone].  Pt is to be transported via [facility].  [place] Coordinator [phone]
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
## 2019-02-01 DIAGNOSIS — F250 Schizoaffective disorder, bipolar type: Secondary | ICD-10-CM | POA: Diagnosis not present

## 2019-02-02 DIAGNOSIS — F250 Schizoaffective disorder, bipolar type: Secondary | ICD-10-CM | POA: Diagnosis not present

## 2019-02-03 DIAGNOSIS — F250 Schizoaffective disorder, bipolar type: Secondary | ICD-10-CM | POA: Diagnosis not present

## 2019-02-04 DIAGNOSIS — F250 Schizoaffective disorder, bipolar type: Secondary | ICD-10-CM | POA: Diagnosis not present

## 2019-02-05 DIAGNOSIS — F250 Schizoaffective disorder, bipolar type: Secondary | ICD-10-CM | POA: Diagnosis not present

## 2019-02-06 DIAGNOSIS — F250 Schizoaffective disorder, bipolar type: Secondary | ICD-10-CM | POA: Diagnosis not present

## 2019-02-07 DIAGNOSIS — F250 Schizoaffective disorder, bipolar type: Secondary | ICD-10-CM | POA: Diagnosis not present

## 2019-02-08 DIAGNOSIS — F250 Schizoaffective disorder, bipolar type: Secondary | ICD-10-CM | POA: Diagnosis not present

## 2019-02-09 DIAGNOSIS — F250 Schizoaffective disorder, bipolar type: Secondary | ICD-10-CM | POA: Diagnosis not present

## 2019-02-10 DIAGNOSIS — F250 Schizoaffective disorder, bipolar type: Secondary | ICD-10-CM | POA: Diagnosis not present

## 2019-11-24 ENCOUNTER — Other Ambulatory Visit: Payer: Self-pay

## 2019-11-24 ENCOUNTER — Emergency Department (HOSPITAL_COMMUNITY)
Admission: EM | Admit: 2019-11-24 | Discharge: 2019-11-26 | Disposition: A | Discharge status: Home / Self Care | Payer: Medicare Other | Attending: Emergency Medicine | Admitting: Emergency Medicine

## 2019-11-24 ENCOUNTER — Encounter (HOSPITAL_COMMUNITY): Payer: Self-pay | Admitting: Emergency Medicine

## 2019-11-24 DIAGNOSIS — F1520 Other stimulant dependence, uncomplicated: Secondary | ICD-10-CM | POA: Diagnosis not present

## 2019-11-24 DIAGNOSIS — Z20822 Contact with and (suspected) exposure to covid-19: Secondary | ICD-10-CM | POA: Insufficient documentation

## 2019-11-24 DIAGNOSIS — Z79899 Other long term (current) drug therapy: Secondary | ICD-10-CM | POA: Insufficient documentation

## 2019-11-24 DIAGNOSIS — F17210 Nicotine dependence, cigarettes, uncomplicated: Secondary | ICD-10-CM | POA: Insufficient documentation

## 2019-11-24 DIAGNOSIS — R456 Violent behavior: Secondary | ICD-10-CM | POA: Diagnosis not present

## 2019-11-24 DIAGNOSIS — F15959 Other stimulant use, unspecified with stimulant-induced psychotic disorder, unspecified: Secondary | ICD-10-CM | POA: Diagnosis present

## 2019-11-24 DIAGNOSIS — F918 Other conduct disorders: Secondary | ICD-10-CM | POA: Insufficient documentation

## 2019-11-24 DIAGNOSIS — F250 Schizoaffective disorder, bipolar type: Secondary | ICD-10-CM | POA: Diagnosis not present

## 2019-11-24 DIAGNOSIS — Z046 Encounter for general psychiatric examination, requested by authority: Secondary | ICD-10-CM | POA: Diagnosis not present

## 2019-11-24 DIAGNOSIS — R4689 Other symptoms and signs involving appearance and behavior: Secondary | ICD-10-CM

## 2019-11-24 DIAGNOSIS — Z03818 Encounter for observation for suspected exposure to other biological agents ruled out: Secondary | ICD-10-CM | POA: Diagnosis not present

## 2019-11-24 LAB — CBC WITH DIFFERENTIAL/PLATELET
Abs Immature Granulocytes: 0.01 10*3/uL (ref 0.00–0.07)
Basophils Absolute: 0 10*3/uL (ref 0.0–0.1)
Basophils Relative: 1 %
Eosinophils Absolute: 0.1 10*3/uL (ref 0.0–0.5)
Eosinophils Relative: 2 %
HCT: 44 % (ref 39.0–52.0)
Hemoglobin: 14 g/dL (ref 13.0–17.0)
Immature Granulocytes: 0 %
Lymphocytes Relative: 19 %
Lymphs Abs: 1 10*3/uL (ref 0.7–4.0)
MCH: 27.1 pg (ref 26.0–34.0)
MCHC: 31.8 g/dL (ref 30.0–36.0)
MCV: 85.1 fL (ref 80.0–100.0)
Monocytes Absolute: 0.5 10*3/uL (ref 0.1–1.0)
Monocytes Relative: 10 %
Neutro Abs: 3.5 10*3/uL (ref 1.7–7.7)
Neutrophils Relative %: 68 %
Platelets: 241 10*3/uL (ref 150–400)
RBC: 5.17 MIL/uL (ref 4.22–5.81)
RDW: 13.2 % (ref 11.5–15.5)
WBC: 5.1 10*3/uL (ref 4.0–10.5)
nRBC: 0 % (ref 0.0–0.2)

## 2019-11-24 LAB — COMPREHENSIVE METABOLIC PANEL
ALT: 27 U/L (ref 0–44)
AST: 19 U/L (ref 15–41)
Albumin: 4.5 g/dL (ref 3.5–5.0)
Alkaline Phosphatase: 49 U/L (ref 38–126)
Anion gap: 7 (ref 5–15)
BUN: 8 mg/dL (ref 6–20)
CO2: 26 mmol/L (ref 22–32)
Calcium: 8.9 mg/dL (ref 8.9–10.3)
Chloride: 105 mmol/L (ref 98–111)
Creatinine, Ser: 0.75 mg/dL (ref 0.61–1.24)
GFR calc Af Amer: >60 mL/min (ref >60)
GFR calc non Af Amer: >60 mL/min (ref >60)
Glucose, Bld: 98 mg/dL (ref 70–99)
Potassium: 3.6 mmol/L (ref 3.5–5.1)
Sodium: 138 mmol/L (ref 135–145)
Total Bilirubin: 0.8 mg/dL (ref 0.3–1.2)
Total Protein: 7.6 g/dL (ref 6.5–8.1)

## 2019-11-24 LAB — SARS CORONAVIRUS 2 BY RT PCR (HOSPITAL ORDER, PERFORMED IN ~~LOC~~ HOSPITAL LAB): SARS Coronavirus 2: NEGATIVE

## 2019-11-24 LAB — ETHANOL: Alcohol, Ethyl (B): <10 mg/dL (ref <10)

## 2019-11-24 LAB — SALICYLATE LEVEL: Salicylate Lvl: <7 mg/dL — ABNORMAL LOW (ref 7.0–30.0)

## 2019-11-24 LAB — ACETAMINOPHEN LEVEL: Acetaminophen (Tylenol), Serum: <10 ug/mL — ABNORMAL LOW (ref 10–30)

## 2019-11-24 MED ORDER — RISPERIDONE 2 MG PO TABS
4.0000 mg | ORAL_TABLET | Freq: Every day | ORAL | Status: DC
  Administered 2019-11-24: 4 mg via ORAL
  Filled 2019-11-24: qty 2

## 2019-11-24 MED ORDER — BENZTROPINE MESYLATE 0.5 MG PO TABS: 0.5000 mg | ORAL_TABLET | Freq: Two times a day (BID) | ORAL | Status: DC | PRN

## 2019-11-24 MED ORDER — TRAZODONE HCL 100 MG PO TABS
100.0000 mg | ORAL_TABLET | Freq: Every day | ORAL | Status: DC
  Administered 2019-11-24 – 2019-11-25 (×2): 100 mg via ORAL
  Filled 2019-11-24 (×2): qty 1

## 2019-11-24 NOTE — ED Notes (Signed)
Pt to room 31. Pt cooperative with moving. Pt was guarded and did not say anything. I oriented pt to unit. Pt resting comfortably.
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
## 2019-11-24 NOTE — ED Notes (Signed)
Pt has been sleeping 

## 2019-11-24 NOTE — [facility] Counselor (Signed)
Attempted to assess patient, however he refuses to respond.  He is awake, but closes his eyes and refuses to engage.  TTS will attempt to assess at a later time.
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
## 2019-11-24 NOTE — BH Assessment (Signed)
Per [name] Heinrich, NP, patient meets criteria for inpatient treatment. Patient referred to the following facilities for review and consideration of a bed:   [facility]  [facility]   [facility]  [facility]  [facility]  [facility]  [facility]  [facility]   [facility]  [facility]  [facility]   [facility]  [facility]  [facility]  [facility]  [facility]
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
## 2019-11-24 NOTE — ED Notes (Signed)
Pt refused vitals, give a urine sample and refused meal. Rn notified.

## 2019-11-24 NOTE — Consult Note (Addendum)
[name] [name] Psych ED Progress Note  11/24/2019 1:01 PM [name] [name]  MRN:  [id number] Subjective: Patient states "I do not know what is going on, I just want to go home." Patient assessed by nurse practitioner along with Dr. Lucianne [name].  Patient alert, with irritable mood.  Patient participates minimally in assessment. Patient denies suicidal and homicidal ideations.  Patient denies auditory visual hallucinations. Patient presents with paranoid ideations.  Patient states "I live with my family but there are ghosts there it is haunted."  Patient presents with disorganized conversation.  Patient reports living with mother, this Clinical research associate requested to call mother.  Patient states "how do you know I have a mom." Patient states "I just need medication to help me sleep." Patient states "stop asking me questions." Attempted to offer support and encouragement. Patient gives verbal consent to speak with his mother, attempted to call, phone hung up x3 once answered.   Principal Problem: <principal problem not specified> Diagnosis:  Active Problems:   * No active [name] problems. *  Total Time spent with patient: 30 minutes  Past Psychiatric History: Cannabis use disorder, alcohol use disorder, schizoaffective disorder, bipolar type, methamphetamine induced psychotic disorder, schizophrenia  Past Medical History:  Past Medical History:  Diagnosis Date  . Anxiety   . Depression   . Mental disorder   . Schizophrenia ([facility])    History reviewed. No pertinent surgical history. Family History:  Family History  Problem Relation Age of Onset  . Mental illness Neg Hx    Family Psychiatric  History: Unknown Social History:  Social History   Substance and Sexual Activity  Alcohol Use Not Currently   Comment: haven't drank in months"     Social History   Substance and Sexual Activity  Drug Use Yes  . Types: Marijuana, Methamphetamines   Comment: Per sister, Pt uses meth    Social History   Socioeconomic  History  . Marital status: Married    Spouse name: Not on file  . Number of children: 1  . Years of education: Not on file  . Highest education level: Not on file  Occupational History  . Occupation: Disabled  Tobacco Use  . Smoking status: Current Every Day Smoker    Packs/day: 1.00    Types: Cigarettes  . Smokeless tobacco: Former Engineer, water and Sexual Activity  . Alcohol use: Not Currently    Comment: haven't drank in months"  . 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 Sexual activity: Yes    Birth control/protection: None  Other Topics Concern  . Not on file  Social History Narrative   Pt lives with parents and sister.  He is married, but currently separated.  Pt is on disability.   Social Determinants of Health   Financial Resource Strain:   . Difficulty of Paying Living Expenses:   Food Insecurity:   . Worried About Programme researcher, broadcasting/film/video in the Last Year:   . Barista in the Last Year:   Transportation Needs:   . Freight forwarder (Medical):   Marland Kitchen Lack of Transportation (Non-Medical):   Physical Activity:   . Days of Exercise per Week:   . Minutes of Exercise per Session:   Stress:   . Feeling of Stress :   Social Connections:   . Frequency of Communication with Friends and Family:   . Frequency of Social Gatherings with Friends and Family:   .  Attends Religious Services:   . Active Member of Clubs or Organizations:   . Attends Banker Meetings:   Marland Kitchen Marital Status:     Sleep: Poor  Appetite:  Good  Current Medications: No current facility-administered medications for this encounter.   Current Outpatient Medications  Medication Sig Dispense Refill  . benztropine (COGENTIN) 1 MG tablet Take 1 tablet (1 mg total) by mouth 2 (two) times daily. 180 tablet 1  . OLANZapine (ZYPREXA) 10 MG tablet Take 10 mg by mouth every morning.    . risperiDONE (RISPERDAL) 3 MG tablet Take 2  tablets (6 mg total) by mouth at bedtime. 180 tablet 1  . traZODone (DESYREL) 100 MG tablet Take 1 tablet (100 mg total) by mouth at bedtime. 90 tablet 1    Lab Results:  Results for orders placed or performed during the [name] encounter of 11/24/19 (from the past 48 hour(s))  CBC with Differential     Status: None   Collection Time: 11/24/19  5:10 AM  Result Value Ref Range   WBC 5.1 4.0 - 10.5 K/uL   RBC 5.17 4.22 - 5.81 MIL/uL   Hemoglobin 14.0 13.0 - 17.0 g/dL   HCT 67.1 24.5 - 80.9 %   MCV 85.1 80.0 - 100.0 fL   MCH 27.1 26.0 - 34.0 pg   MCHC 31.8 30.0 - 36.0 g/dL   RDW 98.3 38.2 - 50.5 %   Platelets 241 150 - 400 K/uL   nRBC 0.0 0.0 - 0.2 %   Neutrophils Relative % 68 %   Neutro Abs 3.5 1.7 - 7.7 K/uL   Lymphocytes Relative 19 %   Lymphs Abs 1.0 0.7 - 4.0 K/uL   Monocytes Relative 10 %   Monocytes Absolute 0.5 0.1 - 1.0 K/uL   Eosinophils Relative 2 %   Eosinophils Absolute 0.1 0.0 - 0.5 K/uL   Basophils Relative 1 %   Basophils Absolute 0.0 0.0 - 0.1 K/uL   Immature Granulocytes 0 %   Abs Immature Granulocytes 0.01 0.00 - 0.07 K/uL    Comment: Performed at [name], [address], [place], [place] [address]  Ethanol     Status: None   Collection Time: 11/24/19  5:10 AM  Result Value Ref Range   Alcohol, Ethyl (B) <10 <10 mg/dL    Comment: (NOTE) Lowest detectable limit for serum alcohol is 10 mg/dL. For medical purposes only. Performed at [facility], [address], [place], [place] [address]   Salicylate level     Status: Abnormal   Collection Time: 11/24/19  5:10 AM  Result Value Ref Range   Salicylate Lvl <7.0 (L) 7.0 - 30.0 mg/dL    Comment: Performed at [facility], [address], [place], [place] [address]  Acetaminophen level     Status: Abnormal   Collection Time: 11/24/19  5:10 AM  Result Value Ref Range   Acetaminophen (Tylenol), Serum <10 (L) 10 - 30 ug/mL    Comment:  (NOTE) Therapeutic concentrations vary significantly. A range of 10-30 ug/mL  may be an effective concentration for many patients. However, some  are best treated at concentrations outside of this range. Acetaminophen concentrations >150 ug/mL at 4 hours after ingestion  and >50 ug/mL at 12 hours after ingestion are often associated with  toxic reactions. Performed at [name], [address], [place], [place] [address]   Comprehensive metabolic panel     Status: None   Collection  Time: 11/24/19  5:10 AM  Result Value Ref Range   Sodium 138 135 - 145 mmol/L   Potassium 3.6 3.5 - 5.1 mmol/L   Chloride 105 98 - 111 mmol/L   CO2 26 22 - 32 mmol/L   Glucose, Bld 98 70 - 99 mg/dL    Comment: Glucose reference range applies only to samples taken after fasting for at least 8 hours.   BUN 8 6 - 20 mg/dL   Creatinine, Ser 2.95 0.61 - 1.24 mg/dL   Calcium 8.9 8.9 - 28.4 mg/dL   Total Protein 7.6 6.5 - 8.1 g/dL   Albumin 4.5 3.5 - 5.0 g/dL   AST 19 15 - 41 U/L   ALT 27 0 - 44 U/L   Alkaline Phosphatase 49 38 - 126 U/L   Total Bilirubin 0.8 0.3 - 1.2 mg/dL   GFR calc non Af Amer >60 >60 mL/min   GFR calc Af Amer >60 >60 mL/min   Anion gap 7 5 - 15    Comment: Performed at [name], [address], [place], [place] [address]  SARS Coronavirus 2 by RT PCR ([name] order, performed in [name] lab) Nasopharyngeal Nasopharyngeal Swab     Status: None   Collection Time: 11/24/19  6:09 AM   Specimen: Nasopharyngeal Swab  Result Value Ref Range   SARS Coronavirus 2 NEGATIVE NEGATIVE    Comment: (NOTE) SARS-CoV-2 target nucleic acids are NOT DETECTED. The SARS-CoV-2 RNA is generally detectable in upper and lower respiratory specimens during the acute phase of infection. The lowest concentration of SARS-CoV-2 viral copies this assay can detect is 250 copies / mL. A negative result does not preclude SARS-CoV-2 infection and should not be  used as the sole basis for treatment or other patient management decisions.  A negative result may occur with improper specimen collection / handling, submission of specimen other than nasopharyngeal swab, presence of viral mutation(s) within the areas targeted by this assay, and inadequate number of viral copies (<250 copies / mL). A negative result must be combined with clinical observations, patient history, and epidemiological information. Fact Sheet for Patients:   BoilerBrush.com.cy Fact Sheet for Healthcare Providers: https://pope.com/ This test is not yet approved or cleared  by the [place] FDA and has been authorized for detection and/or diagnosis of SARS-CoV-2 by FDA under an Emergency Use Authorization (EUA).  This EUA will remain in effect (meaning this test can be used) for the duration of the COVID-19 declaration under Section 564(b)(1) of the Act, 21 U.S.C. section 360bbb-3(b)(1), unless the authorization is terminated or revoked sooner. Performed at [facility], [address] [place], [place] [address]     Blood Alcohol level:  Lab Results  Component Value Date   ETH <10 11/24/2019   ETH <10 01/18/2019    Physical Findings: AIMS:  , ,  ,  ,    CIWA:    COWS:     Musculoskeletal: Strength & Muscle Tone: within normal limits Gait & Station: normal Patient leans: N/A  Psychiatric Specialty Exam: Physical Exam Vitals and nursing note reviewed.  Constitutional:      Appearance: He is well-developed.  HENT:     Head: Normocephalic.  Cardiovascular:     Rate and Rhythm: Normal rate.  Pulmonary:     Effort: Pulmonary effort is normal.  Neurological:     Mental Status: He is alert and oriented to person, place, and time.  Psychiatric:  Attention and Perception: He is inattentive.        Mood and Affect: Mood is anxious. Affect is labile.        Speech: Speech is tangential.         Behavior: Behavior is uncooperative.        Thought Content: Thought content is paranoid and delusional.        Judgment: Judgment is inappropriate.     Review of Systems  Blood pressure 127/84, pulse 81, temperature 98.2 F (36.8 C), temperature source Oral, resp. rate 18, SpO2 100 %.There is no height or weight on file to calculate BMI.  General Appearance: Casual and Fairly Groomed  Eye Contact:  Fair  Speech:  Normal Rate  Volume:  Normal  Mood:  Anxious and Irritable  Affect:  Labile  Thought Process:  Disorganized and Descriptions of Associations: Tangential  Orientation:  Full (Time, Place, and Person)  Thought Content:  Delusions and Paranoid Ideation  Suicidal Thoughts:  No  Homicidal Thoughts:  No  Memory:  Immediate;   Fair Recent;   Fair Remote;   Fair  Judgement:  Impaired  Insight:  Lacking  Psychomotor Activity:  Normal  Concentration:  Concentration: Poor  Recall:  [employer] of Knowledge:  Fair  Language:  Fair  Akathisia:  No  Handed:  Right  AIMS (if indicated):     Assets:  Agricultural consultant Housing Intimacy Leisure Time Physical Health Social Support  ADL's:  Intact  Cognition:  WNL  Sleep:         Treatment Plan Summary: Patient discussed with Dr Mallie Darting.  Patient currently presents as paranoid and disorganized. Inpatient psychiatric treatment recommended at this time. Will reinitiate home medications including Cogentin as needed, Risperdal 4 mg nightly and trazodone 100 mg nightly.    Emmaline Kluver, FNP 11/24/2019, 1:01 PM   Attest: Case discussed and plan agreed upon as outlined above by nurse practitioner Hall Busing.  Patient has a longstanding history of psychosis.  The patient has a longstanding history of substance dependence issues.  He was previously discharged from another facility on Risperdal as well as Zyprexa.  I think initially he should remain on only 1 antipsychotic versus 2.  I agree with  restarting the Risperdal at 4 mg p.o. nightly.  Agree with plan as outlined above otherwise.

## 2019-11-24 NOTE — ED Notes (Signed)
Belonging bags, 2 in total, taken to TCU.

## 2019-11-25 DIAGNOSIS — F918 Other conduct disorders: Secondary | ICD-10-CM | POA: Diagnosis not present

## 2019-11-25 MED ORDER — RISPERIDONE 2 MG PO TABS
2.0000 mg | ORAL_TABLET | Freq: Two times a day (BID) | ORAL | Status: DC
  Administered 2019-11-25 – 2019-11-26 (×2): 2 mg via ORAL
  Filled 2019-11-25 (×2): qty 1

## 2019-11-25 NOTE — ED Provider Notes (Signed)
Emergency Medicine Observation Re-evaluation Note  [name] [name] is a 39 y.o. male, seen on rounds today.  Pt initially presented to the ED for complaints of Aggressive Behavior Currently, the patient is resting comfortably.  Physical Exam  BP 114/72 (BP Location: Right Arm)   Pulse 61   Temp 97.9 F (36.6 C) (Oral)   Resp 18   SpO2 98%  Physical Exam  ED Course / MDM  EKG:    I have reviewed the labs performed to date as well as medications administered while in observation.  Recent changes in the last 24 hours include TTS consult. Plan  Current plan is for inpatient bed search. Patient is under full IVC at this time.   Terrilee Files, MD 11/25/19 [phone]

## 2019-11-25 NOTE — Consult Note (Addendum)
[facility] Psych ED Progress Note  11/25/2019 11:39 AM [name] [name]  MRN:  [id number] Subjective: 11/25/2019 Patient states "I just need to go home but I don't have nobody to talk to." Patient states "I just get mad and start swinging sometimes." Patient assessed by Nurse Practitioner along with Dr. Jannifer Franklin. Patient alert and oriented. Patient tangential and labile intermittently during assessment. Patient states "I just need a car, there may be ghosts in my house.."  Patient denies suicidal and homicidal ideations. Patient denies auditory and visual hallucinations. Patient reports paranoid ideations, states "I think someone is messing with my butt when I am sleeping sometimes."  Patient minimizes substance use disorder, states "I don't use drugs."      05/21/2021Patient states "I do not know what is going on, I just want to go home." Patient assessed by nurse practitioner along with Dr. Lucianne Muss.  Patient alert, with irritable mood.  Patient participates minimally in assessment. Patient denies suicidal and homicidal ideations.  Patient denies auditory visual hallucinations. Patient presents with paranoid ideations.  Patient states "I live with my family but there are ghosts there it is haunted."  Patient presents with disorganized conversation.  Patient reports living with mother, this Clinical research associate requested to call mother.  Patient states "how do you know I have a mom." Patient states "I just need medication to help me sleep." Patient states "stop asking me questions." Attempted to offer support and encouragement. Patient gives verbal consent to speak with his mother, attempted to call, phone hung up x3 once answered.   Principal Problem: Schizoaffective disorder, bipolar type (HCC) Diagnosis:  Principal Problem:   Schizoaffective disorder, bipolar type (HCC) Active Problems:   Methamphetamine-induced psychotic disorder (HCC)  Total Time spent with patient: 30 minutes  Past Psychiatric History:  Cannabis use disorder, alcohol use disorder, schizoaffective disorder, bipolar type, methamphetamine induced psychotic disorder, schizophrenia  Past Medical History:  Past Medical History:  Diagnosis Date  . Anxiety   . Depression   . Mental disorder   . Schizophrenia (HCC)    History reviewed. No pertinent surgical history. Family History:  Family History  Problem Relation Age of Onset  . Mental illness Neg Hx    Family Psychiatric  History: Unknown Social History:  Social History   Substance and Sexual Activity  Alcohol Use Not Currently   Comment: haven't drank in months"     Social History   Substance and Sexual Activity  Drug Use Yes  . 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 Marital status: Married    Spouse name: Not on file  . Number of children: 1  . Years of education: Not on file  . Highest education level: Not on file  Occupational History  . Occupation: Disabled  Tobacco Use  . Smoking status: Current Every Day Smoker    Packs/day: 1.00    Types: Cigarettes  . Smokeless tobacco: Former Engineer, water and Sexual Activity  . Alcohol use: Not Currently    Comment: haven't drank in months"  . 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 Sexual activity: Yes    Birth control/protection: None  Other Topics Concern  . Not on file  Social History Narrative   Pt lives with parents and sister.  He is married, but currently separated.  Pt is on disability.   Social Determinants of Health  Financial Resource Strain:   . Difficulty of Paying Living Expenses:   Food Insecurity:   . Worried About Programme researcher, broadcasting/film/video in the Last Year:   . Barista in the Last Year:   Transportation Needs:   . Freight forwarder (Medical):   Marland Kitchen Lack of Transportation (Non-Medical):   Physical Activity:   . Days of Exercise per Week:    . Minutes of Exercise per Session:   Stress:   . Feeling of Stress :   Social Connections:   . Frequency of Communication with Friends and Family:   . Frequency of Social Gatherings with Friends and Family:   . Attends Religious Services:   . Active Member of Clubs or Organizations:   . Attends Banker Meetings:   Marland Kitchen Marital Status:     Sleep: Fair  Appetite:  Fair  Current Medications: Current Facility-Administered Medications  Medication Dose Route Frequency Provider Last Rate Last Admin  . benztropine (COGENTIN) tablet 0.5 mg  0.5 mg Oral BID PRN Patrcia Dolly, FNP      . risperiDONE (RISPERDAL) tablet 2 mg  2 mg Oral BID Stirling Orton, MD      . traZODone (DESYREL) tablet 100 mg  100 mg Oral QHS Patrcia Dolly, FNP   100 mg at 11/24/19 2124   Current Outpatient Medications  Medication Sig Dispense Refill  . benztropine (COGENTIN) 1 MG tablet Take 1 tablet (1 mg total) by mouth 2 (two) times daily. (Patient not taking: Reported on 11/25/2019) 180 tablet 1  . risperiDONE (RISPERDAL) 3 MG tablet Take 2 tablets (6 mg total) by mouth at bedtime. (Patient not taking: Reported on 11/25/2019) 180 tablet 1  . traZODone (DESYREL) 100 MG tablet Take 1 tablet (100 mg total) by mouth at bedtime. (Patient not taking: Reported on 11/25/2019) 90 tablet 1    Lab Results:  Results for orders placed or performed during the hospital encounter of 11/24/19 (from the past 48 hour(s))  CBC with Differential     Status: None   Collection Time: 11/24/19  5:10 AM  Result Value Ref Range   WBC 5.1 4.0 - 10.5 K/uL   RBC 5.17 4.22 - 5.81 MIL/uL   Hemoglobin 14.0 13.0 - 17.0 g/dL   HCT 16.0 10.9 - 32.3 %   MCV 85.1 80.0 - 100.0 fL   MCH 27.1 26.0 - 34.0 pg   MCHC 31.8 30.0 - 36.0 g/dL   RDW 55.7 32.2 - 02.5 %   Platelets 241 150 - 400 K/uL   nRBC 0.0 0.0 - 0.2 %   Neutrophils Relative % 68 %   Neutro Abs 3.5 1.7 - 7.7 K/uL   Lymphocytes Relative 19 %   Lymphs Abs 1.0 0.7 - 4.0 K/uL    Monocytes Relative 10 %   Monocytes Absolute 0.5 0.1 - 1.0 K/uL   Eosinophils Relative 2 %   Eosinophils Absolute 0.1 0.0 - 0.5 K/uL   Basophils Relative 1 %   Basophils Absolute 0.0 0.0 - 0.1 K/uL   Immature Granulocytes 0 %   Abs Immature Granulocytes 0.01 0.00 - 0.07 K/uL    Comment: Performed at [facility], [address], [place], [place] [address]  Ethanol     Status: None   Collection Time: 11/24/19  5:10 AM  Result Value Ref Range   Alcohol, Ethyl (B) <10 <10 mg/dL    Comment: (NOTE) Lowest detectable limit for serum alcohol is 10 mg/dL. For  medical purposes only. Performed at [facility], [place] [address], [place], [place] [address]   Salicylate level     Status: Abnormal   Collection Time: 11/24/19  5:10 AM  Result Value Ref Range   Salicylate Lvl <1.6 (L) 7.0 - 30.0 mg/dL    Comment: Performed at [facility], [place] [address] [place], [place] [address]  Acetaminophen level     Status: Abnormal   Collection Time: 11/24/19  5:10 AM  Result Value Ref Range   Acetaminophen (Tylenol), Serum <10 (L) 10 - 30 ug/mL    Comment: (NOTE) Therapeutic concentrations vary significantly. A range of 10-30 ug/mL  may be an effective concentration for many patients. However, some  are best treated at concentrations outside of this range. Acetaminophen concentrations >150 ug/mL at 4 hours after ingestion  and >50 ug/mL at 12 hours after ingestion are often associated with  toxic reactions. Performed at [facility], [place] [address] [place], [place] [address]   Comprehensive metabolic panel     Status: None   Collection Time: 11/24/19  5:10 AM  Result Value Ref Range   Sodium 138 135 - 145 mmol/L   Potassium 3.6 3.5 - 5.1 mmol/L   Chloride 105 98 - 111 mmol/L   CO2 26 22 - 32 mmol/L   Glucose, Bld 98 70 - 99 mg/dL    Comment: Glucose reference range applies only to samples taken after fasting for at least  8 hours.   BUN 8 6 - 20 mg/dL   Creatinine, Ser 0.75 0.61 - 1.24 mg/dL   Calcium 8.9 8.9 - 10.3 mg/dL   Total Protein 7.6 6.5 - 8.1 g/dL   Albumin 4.5 3.5 - 5.0 g/dL   AST 19 15 - 41 U/L   ALT 27 0 - 44 U/L   Alkaline Phosphatase 49 38 - 126 U/L   Total Bilirubin 0.8 0.3 - 1.2 mg/dL   GFR calc non Af Amer >60 >60 mL/min   GFR calc Af Amer >60 >60 mL/min   Anion gap 7 5 - 15    Comment: Performed at [facility], [place] [address] [place], [place] [address]  SARS Coronavirus 2 by RT PCR (hospital order, performed in [facility] hospital lab) Nasopharyngeal Nasopharyngeal Swab     Status: None   Collection Time: 11/24/19  6:09 AM   Specimen: Nasopharyngeal Swab  Result Value Ref Range   SARS Coronavirus 2 NEGATIVE NEGATIVE    Comment: (NOTE) SARS-CoV-2 target nucleic acids are NOT DETECTED. The SARS-CoV-2 RNA is generally detectable in upper and lower respiratory specimens during the acute phase of infection. The lowest concentration of SARS-CoV-2 viral copies this assay can detect is 250 copies / mL. A negative result does not preclude SARS-CoV-2 infection and should not be used as the sole basis for treatment or other patient management decisions.  A negative result may occur with improper specimen collection / handling, submission of specimen other than nasopharyngeal swab, presence of viral mutation(s) within the areas targeted by this assay, and inadequate number of viral copies (<250 copies / mL). A negative result must be combined with clinical observations, patient history, and epidemiological information. Fact Sheet for Patients:   StrictlyIdeas.no Fact Sheet for Healthcare Providers: BankingDealers.co.za This test is not yet approved or cleared  by the [place] FDA and has been authorized for detection and/or diagnosis of SARS-CoV-2 by FDA under an Emergency Use Authorization (EUA).  This EUA will  remain in effect (meaning this test can be used) for the duration of the COVID-19 declaration under Section 564(b)(1) of the Act, 21 U.S.C. section 360bbb-3(b)(1), unless the authorization is terminated or revoked sooner. Performed at [facility], [address] [place], [place] [address]     Blood Alcohol level:  Lab Results  Component Value Date   ETH <10 11/24/2019   ETH <10 01/18/2019    Physical Findings: AIMS:  , ,  ,  ,    CIWA:    COWS:     Musculoskeletal: Strength & Muscle Tone: within normal limits Gait & Station: normal Patient leans: N/A  Psychiatric Specialty Exam: Physical Exam Vitals and nursing note reviewed.  Constitutional:      Appearance: He is well-developed.  HENT:     Head: Normocephalic.     Nose: Nose normal.  Cardiovascular:     Rate and Rhythm: Normal rate.  Pulmonary:     Effort: Pulmonary effort is normal.  Musculoskeletal:        General: Normal range of motion.  Neurological:     Mental Status: He is alert and oriented to person, place, and time.  Psychiatric:        Attention and Perception: Attention normal.        Mood and Affect: Mood is anxious.        Speech: Speech is tangential.        Behavior: Behavior is uncooperative.        Thought Content: Thought content is paranoid.        Cognition and Memory: Cognition normal.        Judgment: Judgment is inappropriate.     Review of Systems  Constitutional: Negative.   HENT: Negative.   Eyes: Negative.   Respiratory: Negative.   Cardiovascular: Negative.   Gastrointestinal: Negative.   Genitourinary: Negative.   Musculoskeletal: Negative.   Skin: Negative.   Neurological: Negative.   Psychiatric/Behavioral: Positive for hallucinations and sleep disturbance.    Blood pressure 114/72, pulse 61, temperature 97.9 F (36.6 C), temperature source Oral, resp. rate 18, SpO2 98 %.There is no height or weight on file to calculate BMI.  General Appearance:  Casual and Fairly Groomed  Eye Contact:  Fair  Speech:  Normal Rate  Volume:  Normal  Mood:  Anxious and Irritable  Affect:  Labile  Thought Process:  Disorganized and Descriptions of Associations: Tangential  Orientation:  Full (Time, Place, and Person)  Thought Content:  Delusions and Paranoid Ideation  Suicidal Thoughts:  No  Homicidal Thoughts:  No  Memory:  Immediate;   Fair Recent;   Fair Remote;   Fair  Judgement:  Impaired  Insight:  Lacking  Psychomotor Activity:  Normal  Concentration:  Concentration: Poor  Recall:  [employer] of Knowledge:  Fair  Language:  Fair  Akathisia:  No  Handed:  Right  AIMS (if indicated):     Assets:  Architect Housing Intimacy Leisure Time Physical Health Social Support  ADL's:  Intact  Cognition:  WNL  Sleep:         Treatment Plan Summary: Patient discussed with Dr Jannifer Franklin.  Patient currently presents as paranoid and tangential. Continue to recommend Inpatient psychiatric treatment at this time. Will modify Risperdal from 4 mg nightly to 2mg  each morning and 2mg  each evening to address continued paranoid ideations.    , FNP 11/25/2019, 11:39 AM  Patient seen face-to-face for psychiatric evaluation, chart  reviewed and case discussed with the physician extender and developed treatment plan. Reviewed the information documented and agree with the treatment plan. Thedore MinsMojeed Linsi Humann, MD

## 2019-11-25 NOTE — Progress Notes (Signed)
11/25/2019  1209  Patient just had one episode of just yelling out. Pt is currently lying in bed with covers covering head to toe.

## 2019-11-25 NOTE — Progress Notes (Signed)
11/25/2019  1628  I have not seen peer support today.

## 2019-11-26 DIAGNOSIS — F918 Other conduct disorders: Secondary | ICD-10-CM | POA: Diagnosis not present

## 2019-11-26 MED ORDER — RISPERIDONE 3 MG PO TABS: 6.0000 mg | ORAL_TABLET | Freq: Every day | ORAL | 1 refills | Status: DC

## 2019-11-26 MED ORDER — BENZTROPINE MESYLATE 1 MG PO TABS: 1.0000 mg | ORAL_TABLET | Freq: Two times a day (BID) | ORAL | 1 refills | Status: DC

## 2019-11-26 MED ORDER — TRAZODONE HCL 100 MG PO TABS: 100.0000 mg | ORAL_TABLET | Freq: Every day | ORAL | 1 refills | Status: DC

## 2019-11-26 NOTE — Progress Notes (Signed)
Per Berneice Heinrich NP, pt is stable for discharge today. He would like to return home to family and go to work. Pt is speaking clearly and has been referred to his previous provider, [facility] with recommendation of going to the walk-in clinic within 3 days of hospital discharge for follow-up.   Makia Bossi S. Alan Ripper, MSW, LCSW Clinical Social Worker 11/26/2019 10:56 AM

## 2019-11-26 NOTE — ED Notes (Signed)
Pt DC off unit to home per provider. Pt alert, calm, cooperative, no s/s of distress. DC information given to and reviewed with pt, pt acknowledged understanding. Belongings given to pt. Pt ambulatory off unit, escorted by NT. Pt transported by family.

## 2019-11-26 NOTE — ED Notes (Signed)
Pt calm, cooperative, no s/s of distress. Pt showered. Pt talking clearly. Pt denies A/V hallucinations at this time. Pt would like to see doctor to be DCd. Pt wants to see his family and go to work.

## 2019-11-26 NOTE — Consult Note (Addendum)
[facility] Psych ED Discharge  11/26/2019 11:22 AM [name] [name]  MRN:  [id number] Principal Problem: Schizoaffective disorder, bipolar type [facility]) Discharge Diagnoses: Principal Problem:   Schizoaffective disorder, bipolar type (HCC) Active Problems:   Methamphetamine-induced psychotic disorder (HCC)   Subjective: Patient states "I want to get a job, when I work I feel better.  I want to get my wife and kid back.  I know I need to stop using drugs, stay on my medications and get a job to do this." Patient assessed by nurse practitioner along with Dr. Jannifer Franklin.  Patient alert and oriented, answers appropriately.  Patient reports plan to "I will be, and I will stop using drugs, no more drugs." Patient reports he lives at home with his mother, sister and sister's family.  Patient denies access to weapons.  Patient currently unemployed, "I am looking for a job."  Patient reports history of meth amphetamine use.  Patient denies alcohol use. Patient reports he was recently followed by an act team but "they stopped coming out."  Patient reports plan to follow-up with act team. Patient reports "I spoke with my sister last night and she says I can come home."   Total Time spent with patient: 30 minutes  Past Psychiatric History: Schizoaffective disorder, bipolar type, methamphetamine use disorder, cannabis use disorder, alcohol use disorder  Past Medical History:  Past Medical History:  Diagnosis Date  . Anxiety   . Depression   . Mental disorder   . Schizophrenia (HCC)    History reviewed. No pertinent surgical history. Family History:  Family History  Problem Relation Age of Onset  . Mental illness Neg Hx    Family Psychiatric  History: None reported Social History:  Social History   Substance and Sexual Activity  Alcohol Use Not Currently   Comment: haven't drank in months"     Social History   Substance and Sexual Activity  Drug Use Yes  . Types: Marijuana, Methamphetamines   Comment:  Per sister, Pt uses meth    Social History   Socioeconomic History  . Marital status: Married    Spouse name: Not on file  . Number of children: 1  . Years of education: Not on file  . Highest education level: Not on file  Occupational History  . Occupation: Disabled  Tobacco Use  . Smoking status: Current Every Day Smoker    Packs/day: 1.00    Types: Cigarettes  . Smokeless tobacco: Former Engineer, water and Sexual Activity  . Alcohol use: Not Currently    Comment: haven't drank in months"  . 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 Sexual activity: Yes    Birth control/protection: None  Other Topics Concern  . Not on file  Social History Narrative   Pt lives with parents and sister.  He is married, but currently separated.  Pt is on disability.   Social Determinants of Health   Financial Resource Strain:   . Difficulty of Paying Living Expenses:   Food Insecurity:   . Worried About Programme researcher, broadcasting/film/video in the Last Year:   . Barista in the Last Year:   Transportation Needs:   . Freight forwarder (Medical):   Marland Kitchen Lack of Transportation (Non-Medical):   Physical Activity:   . Days of Exercise per Week:   . Minutes of Exercise per Session:   Stress:   . Feeling of Stress :  Social Connections:   . Frequency of Communication with Friends and Family:   . Frequency of Social Gatherings with Friends and Family:   . Attends Religious Services:   . Active Member of Clubs or Organizations:   . Attends Archivist Meetings:   Marland Kitchen Marital Status:     Has this patient used any form of tobacco in the last 30 days? (Cigarettes, Smokeless Tobacco, Cigars, and/or Pipes) A prescription for an FDA-approved tobacco cessation medication was offered at discharge and the patient refused  Current Medications: Current Facility-Administered Medications  Medication Dose Route Frequency Provider Last Rate Last Admin   . benztropine (COGENTIN) tablet 0.5 mg  0.5 mg Oral BID PRN Emmaline Kluver, FNP      . risperiDONE (RISPERDAL) tablet 2 mg  2 mg Oral BID Darleene Cleaver, Ying Blankenhorn, MD   2 mg at 11/26/19 1017  . traZODone (DESYREL) tablet 100 mg  100 mg Oral QHS Emmaline Kluver, FNP   100 mg at 11/25/19 2153   Current Outpatient Medications  Medication Sig Dispense Refill  . benztropine (COGENTIN) 1 MG tablet Take 1 tablet (1 mg total) by mouth 2 (two) times daily. (Patient not taking: Reported on 11/25/2019) 180 tablet 1  . risperiDONE (RISPERDAL) 3 MG tablet Take 2 tablets (6 mg total) by mouth at bedtime. (Patient not taking: Reported on 11/25/2019) 180 tablet 1  . traZODone (DESYREL) 100 MG tablet Take 1 tablet (100 mg total) by mouth at bedtime. (Patient not taking: Reported on 11/25/2019) 90 tablet 1   PTA Medications: (Not in a hospital admission)   Musculoskeletal: Strength & Muscle Tone: within normal limits Gait & Station: normal Patient leans: N/A  Psychiatric Specialty Exam: Physical Exam Vitals and nursing note reviewed.  Constitutional:      Appearance: He is well-developed.  HENT:     Head: Normocephalic.  Cardiovascular:     Rate and Rhythm: Normal rate.  Pulmonary:     Effort: Pulmonary effort is normal.  Neurological:     Mental Status: He is alert and oriented to person, place, and time.  Psychiatric:        Attention and Perception: Attention and perception normal.        Mood and Affect: Mood and affect normal.        Speech: Speech normal.        Behavior: Behavior normal. Behavior is cooperative.        Thought Content: Thought content normal.        Cognition and Memory: Cognition normal.        Judgment: Judgment normal.     Review of Systems  Constitutional: Negative.   HENT: Negative.   Eyes: Negative.   Respiratory: Negative.   Cardiovascular: Negative.   Gastrointestinal: Negative.   Genitourinary: Negative.   Musculoskeletal: Negative.   Skin: Negative.    Neurological: Negative.   Psychiatric/Behavioral: Negative.     Blood pressure 108/76, pulse 66, temperature 98.4 F (36.9 C), temperature source Oral, resp. rate 18, SpO2 98 %.There is no height or weight on file to calculate BMI.  General Appearance: Casual and Fairly Groomed  Eye Contact:  Good  Speech:  Clear and Coherent and Normal Rate  Volume:  Normal  Mood:  Euthymic  Affect:  Congruent  Thought Process:  Coherent, Goal Directed and Descriptions of Associations: Intact  Orientation:  Full (Time, Place, and Person)  Thought Content:  Logical  Suicidal Thoughts:  No  Homicidal Thoughts:  No  Memory:  Immediate;   Good Recent;   Good Remote;   Good  Judgement:  Fair  Insight:  Fair  Psychomotor Activity:  Normal  Concentration:  Concentration: Good and Attention Span: Good  Recall:  Good  Fund of Knowledge:  Good  Language:  Good  Akathisia:  No  Handed:  Right  AIMS (if indicated):     Assets:  Communication Skills Desire for Improvement Financial Resources/Insurance Housing Intimacy Leisure Time Physical Health Resilience Social Support  ADL's:  Intact  Cognition:  WNL  Sleep:        Demographic Factors:  Male  Loss Factors: NA  Historical Factors: NA  Risk Reduction Factors:   Living with another person, especially a relative, Positive social support, Positive therapeutic relationship and Positive coping skills or problem solving skills  Continued Clinical Symptoms:  Alcohol/Substance Abuse/Dependencies  Cognitive Features That Contribute To Risk:  None    Suicide Risk:  Minimal: No identifiable suicidal ideation.  Patients presenting with no risk factors but with morbid ruminations; may be classified as minimal risk based on the severity of the depressive symptoms  Follow-up Information    [facility] Follow up.   Why: Please walk in within 3 days of hospital discharge for hospital follow-up/medication management/therapy/substance abuse group  therapy.  Contact information: [address] [place] [address] [phone]           Plan Of Care/Follow-up recommendations:  Other:  Follow-up with established outpatient psychiatry.  Follow-up with substance use treatment resources provided by peers support specialist.  Disposition: Discharge Patrcia Dolly, FNP 11/26/2019, 11:22 AM  Patient seen face-to-face for psychiatric evaluation, chart reviewed and case discussed with the physician extender and developed treatment plan. Reviewed the information documented and agree with the treatment plan. Thedore Mins, MD

## 2019-11-26 NOTE — Progress Notes (Addendum)
RN updated that CSW spoke to psychiatry and pt's IVC is being rescinded by their team.  RN updated.  CSW will continue to follow for D/C needs.  [name] [name]. [place]iffe[place]  MSW, LCSW, LCAS, CCS Transitions of Care Clinical Social Worker Care Coordination Department Ph: [phone]
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
## 2020-02-28 ENCOUNTER — Encounter (HOSPITAL_COMMUNITY): Payer: Self-pay | Admitting: *Deleted

## 2020-02-28 ENCOUNTER — Other Ambulatory Visit: Payer: Self-pay

## 2020-02-28 ENCOUNTER — Emergency Department (HOSPITAL_COMMUNITY)
Admission: EM | Admit: 2020-02-28 | Discharge: 2020-02-29 | Disposition: A | Discharge status: Other Acute Inpatient Hospital | Payer: Medicare Other | Attending: Emergency Medicine | Admitting: Emergency Medicine

## 2020-02-28 DIAGNOSIS — R45850 Homicidal ideations: Secondary | ICD-10-CM | POA: Insufficient documentation

## 2020-02-28 DIAGNOSIS — F1210 Cannabis abuse, uncomplicated: Secondary | ICD-10-CM | POA: Diagnosis not present

## 2020-02-28 DIAGNOSIS — Z046 Encounter for general psychiatric examination, requested by authority: Secondary | ICD-10-CM

## 2020-02-28 DIAGNOSIS — F1510 Other stimulant abuse, uncomplicated: Secondary | ICD-10-CM | POA: Insufficient documentation

## 2020-02-28 DIAGNOSIS — R451 Restlessness and agitation: Secondary | ICD-10-CM | POA: Diagnosis not present

## 2020-02-28 DIAGNOSIS — R259 Unspecified abnormal involuntary movements: Secondary | ICD-10-CM | POA: Insufficient documentation

## 2020-02-28 DIAGNOSIS — F209 Schizophrenia, unspecified: Secondary | ICD-10-CM | POA: Insufficient documentation

## 2020-02-28 DIAGNOSIS — Y907 Blood alcohol level of 200-239 mg/100 ml: Secondary | ICD-10-CM | POA: Insufficient documentation

## 2020-02-28 DIAGNOSIS — F259 Schizoaffective disorder, unspecified: Secondary | ICD-10-CM | POA: Diagnosis not present

## 2020-02-28 DIAGNOSIS — F600 Paranoid personality disorder: Secondary | ICD-10-CM | POA: Insufficient documentation

## 2020-02-28 DIAGNOSIS — F17210 Nicotine dependence, cigarettes, uncomplicated: Secondary | ICD-10-CM | POA: Insufficient documentation

## 2020-02-28 DIAGNOSIS — Z20822 Contact with and (suspected) exposure to covid-19: Secondary | ICD-10-CM | POA: Diagnosis not present

## 2020-02-28 LAB — CBC
HCT: 45.8 % (ref 39.0–52.0)
Hemoglobin: 14.4 g/dL (ref 13.0–17.0)
MCH: 25.7 pg — ABNORMAL LOW (ref 26.0–34.0)
MCHC: 31.4 g/dL (ref 30.0–36.0)
MCV: 81.6 fL (ref 80.0–100.0)
Platelets: 212 10*3/uL (ref 150–400)
RBC: 5.61 MIL/uL (ref 4.22–5.81)
RDW: 12.3 % (ref 11.5–15.5)
WBC: 6.2 10*3/uL (ref 4.0–10.5)
nRBC: 0 % (ref 0.0–0.2)

## 2020-02-28 LAB — COMPREHENSIVE METABOLIC PANEL
ALT: 27 U/L (ref 0–44)
AST: 18 U/L (ref 15–41)
Albumin: 4.5 g/dL (ref 3.5–5.0)
Alkaline Phosphatase: 49 U/L (ref 38–126)
Anion gap: 11 (ref 5–15)
BUN: 9 mg/dL (ref 6–20)
CO2: 21 mmol/L — ABNORMAL LOW (ref 22–32)
Calcium: 9.6 mg/dL (ref 8.9–10.3)
Chloride: 107 mmol/L (ref 98–111)
Creatinine, Ser: 0.9 mg/dL (ref 0.61–1.24)
GFR calc Af Amer: >60 mL/min (ref >60)
GFR calc non Af Amer: >60 mL/min (ref >60)
Glucose, Bld: 98 mg/dL (ref 70–99)
Potassium: 3.4 mmol/L — ABNORMAL LOW (ref 3.5–5.1)
Sodium: 139 mmol/L (ref 135–145)
Total Bilirubin: 0.5 mg/dL (ref 0.3–1.2)
Total Protein: 7.4 g/dL (ref 6.5–8.1)

## 2020-02-28 LAB — ACETAMINOPHEN LEVEL: Acetaminophen (Tylenol), Serum: <10 ug/mL — ABNORMAL LOW (ref 10–30)

## 2020-02-28 LAB — RAPID URINE DRUG SCREEN, HOSP PERFORMED
Amphetamines: NOT DETECTED
Barbiturates: NOT DETECTED
Benzodiazepines: NOT DETECTED
Cocaine: NOT DETECTED
Opiates: NOT DETECTED
Tetrahydrocannabinol: NOT DETECTED

## 2020-02-28 LAB — SARS CORONAVIRUS 2 BY RT PCR (HOSPITAL ORDER, PERFORMED IN ~~LOC~~ HOSPITAL LAB): SARS Coronavirus 2: NEGATIVE

## 2020-02-28 LAB — SALICYLATE LEVEL: Salicylate Lvl: <7 mg/dL — ABNORMAL LOW (ref 7.0–30.0)

## 2020-02-28 LAB — ETHANOL: Alcohol, Ethyl (B): <10 mg/dL (ref <10)

## 2020-02-28 MED ORDER — RISPERIDONE 3 MG PO TABS: 6.0000 mg | ORAL_TABLET | Freq: Every day | ORAL | Status: DC

## 2020-02-28 MED ORDER — BENZTROPINE MESYLATE 1 MG PO TABS: 1.0000 mg | ORAL_TABLET | Freq: Two times a day (BID) | ORAL | Status: DC

## 2020-02-28 MED ORDER — LORAZEPAM 2 MG/ML IJ SOLN
1.0000 mg | Freq: Once | INTRAMUSCULAR | Status: AC
  Administered 2020-02-28: 1 mg via INTRAMUSCULAR
  Filled 2020-02-28: qty 1

## 2020-02-28 MED ORDER — HALOPERIDOL LACTATE 5 MG/ML IJ SOLN
5.0000 mg | Freq: Once | INTRAMUSCULAR | Status: AC
  Administered 2020-02-28: 5 mg via INTRAMUSCULAR
  Filled 2020-02-28: qty 1

## 2020-02-28 MED ORDER — TRAZODONE HCL 100 MG PO TABS: 100.0000 mg | ORAL_TABLET | Freq: Every day | ORAL | Status: DC
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
## 2020-02-28 NOTE — ED Triage Notes (Signed)
Pt brought in by [employer] with IVC papers. Family called 911 due to pt threatening them. On their arrival, pt was coming out of the house with a knife and wanting to harm others. Pt denies any complaints other than wanting to leave and go home. Pt has hx of HI behavior.

## 2020-02-28 NOTE — ED Notes (Signed)
Pt refused Vitals. He stated that he is fine and there is nothing wrong with him.

## 2020-02-28 NOTE — BH Assessment (Addendum)
Assessment Note   Diagnosis: Schizophrenia Disposition: Shuvon Rankin, NP recommends inpt psychiatric tx  [name] [name] is a 39 y.o. male who presents to [facility] involuntarily. Pt was petitioned by his mother, [name] [name], [phone]. Petition states "My uncle was involuntarily committed and was diagnosed with schizophrenia. He does not take his medication. He is barricaded in his house with a knife. He is threatening to kill himself and his family. He sees dead people and blood everywhere in his house. He also sees his family members getting killed. He is also abusing other drugs like cocaine and meth. He needs help."  Pt is well- known to [employer] & has presented to ED under IVC previously.  [employer] reports that when picking pt up this time, he came out of his house wielding a machete and threatening to harm himself and others.Pt refused to answer TTS assessment questions. He refused to enter any of the rooms, telling the staff they were occupied by ghosts. In the hallway, he paced. He was focused on his father, asking where his Dad was and stating "if he isn't here, you must have killed my Dad". Pt then continued to pace and would not engage further in conversation.     Past Medical History:  Past Medical History:  Diagnosis Date  . Anxiety   . Depression   . Mental disorder   . Schizophrenia (HCC)     History reviewed. No pertinent surgical history.  Family History:  Family History  Problem Relation Age of Onset  . Mental illness Neg Hx     Social History:  reports that he has been smoking cigarettes. He has been smoking about 1.00 pack per day. He has quit using smokeless tobacco. He reports previous alcohol use. He reports current drug use. Drugs: Marijuana and Methamphetamines.  Additional Social History:  Alcohol / Drug Use Pain Medications: see MAR Prescriptions: see MAR Over the Counter: see MAR History of alcohol / drug use?: Yes Longest period of sobriety (when/how long):  unknown  CIWA: CIWA-Ar BP: 125/88 Pulse Rate: 89 COWS:    Allergies: No Known Allergies  Home Medications: (Not in a hospital admission)   OB/GYN Status:  No LMP for male patient.  General Assessment Data Location of Assessment: [facility] ED TTS Assessment: In system Is this a Tele or Face-to-Face Assessment?: Tele Assessment Is this an Initial Assessment or a Re-assessment for this encounter?: Initial Assessment Patient Accompanied by:: N/A Language Other than English:  (UTA) Living Arrangements:  (UTA) What gender do you identify as?:  (UTA) Date Telepsych consult ordered in [facility]: 02/28/20 Time Telepsych consult ordered in [facility]: 0644 Marital status:  (UTA) Living Arrangements: Parent, Other relatives Can pt return to current living arrangement?:  (UTA) Admission Status: Involuntary Insurance type: medicaid     Crisis Care Plan Living Arrangements: Parent, Other relatives Name of Psychiatrist:  (UTA) Name of Therapist:  (UTA)  Education Status Is patient currently in school?:  (UTA)  Risk to self with the past 6 months Suicidal Ideation:  (UTA) Previous Attempts/Gestures:  (UTA) Family Suicide History: Unable to assess Recent stressful life event(s):  (UTA) Persecutory voices/beliefs?: Yes Depression:  (UTA) Depression Symptoms:  (UTA) Substance abuse history and/or treatment for substance abuse?: Yes Suicide prevention information given to non-admitted patients: Not applicable  Risk to Others within the past 6 months Homicidal Ideation: No-Not Currently/Within Last 6 Months (threatening family per IVC) Thoughts of Harm to Others: No-Not Currently Present/Within Last 6 Months Current Homicidal Intent:  (came out of  house with knife in hand when police arrived) Current Homicidal Plan:  (UTA) History of harm to others?:  (hx of aggression) Assessment of Violence: On admission Violent Behavior Description: came out of house with knife in hand when police arrived Does  patient have access to weapons?:  (UTA) Criminal Charges Pending?:  (UTA)  Psychosis Hallucinations: Auditory Delusions: Persecutory  Mental Status Report Appearance/Hygiene: Disheveled Eye Contact: Poor Motor Activity: Restlessness Speech: Other (Comment) (mostly refuses to speak) Level of Consciousness: Restless Mood: Suspicious, Anxious Affect: Constricted Anxiety Level: Moderate Thought Processes: Unable to Assess Judgement: Impaired Orientation: Unable to assess Obsessive Compulsive Thoughts/Behaviors: Unable to Assess  Cognitive Functioning Concentration: Unable to Assess Memory: Unable to Assess Is patient IDD: No Insight: Poor Impulse Control: Fair Appetite:  (UTA) Sleep: Unable to Assess Vegetative Symptoms: Unable to Assess  ADLScreening [facility] Assessment Services) Patient's cognitive ability adequate to safely complete daily activities?: Yes Patient able to express need for assistance with ADLs?: Yes Independently performs ADLs?: Yes (appropriate for developmental age)  Prior Inpatient Therapy Prior Inpatient Therapy: Yes Prior Therapy Dates: 01/31/2019 Prior Therapy Facilty/Provider(s): [facility] Reason for Treatment: Psychosis  Prior Outpatient Therapy Prior Outpatient Therapy:  (UTA)  ADL Screening (condition at time of admission) Patient's cognitive ability adequate to safely complete daily activities?: Yes Is the patient deaf or have difficulty hearing?: No Does the patient have difficulty seeing, even when wearing glasses/contacts?: No Does the patient have difficulty concentrating, remembering, or making decisions?: No Patient able to express need for assistance with ADLs?: Yes Does the patient have difficulty dressing or bathing?: No Independently performs ADLs?: Yes (appropriate for developmental age) Does the patient have difficulty walking or climbing stairs?: No Weakness of Legs: None Weakness of Arms/Hands: None  Home Assistive  Devices/Equipment Home Assistive Devices/Equipment: None  Therapy Consults (therapy consults require a physician order) PT Evaluation Needed: No OT Evalulation Needed: No SLP Evaluation Needed: No Abuse/Neglect Assessment (Assessment to be complete while patient is alone) Abuse/Neglect Assessment Can Be Completed: Unable to assess, patient is non-responsive or altered mental status Values / Beliefs Cultural Requests During Hospitalization: None Spiritual Requests During Hospitalization: None Consults Spiritual Care Consult Needed: No Transition of Care Team Consult Needed: No Advance Directives (For Healthcare) Does Patient Have a Medical Advance Directive?: No Would patient like information on creating a medical advance directive?: No - Patient declined          Disposition: Shuvon Rankin, NP recommends inpt psychiatric tx Disposition Initial Assessment Completed for this Encounter: Yes  On Site Evaluation by:   Reviewed with Physician:    Clearnce Sorrel 02/28/2020 3:37 PM

## 2020-02-28 NOTE — ED Notes (Signed)
Pt pacing outside of room. He refuses to go in and states he doesn't want to go in room due to fact that there are ghosts in there. TTS occurring at this time. He is refusing to talk to Deidre at this time

## 2020-02-28 NOTE — ED Notes (Signed)
RN to see pt, pt refusing to answer any questions at this time, pt states " give me a blanket and my family , I'm not saying anything else." RN will continue to monitor

## 2020-02-29 NOTE — Progress Notes (Signed)
Pt meets inpatient criteria per Assunta Found, NP. Referral information has been sent to the following hospitals for review:  [facility]  [facility]  [facility]  [facility]  [facility]  [facility]  [facility]  [facility]  [facility]  [facility]      Disposition will continue to follow.    Wells Guiles, LCSW, LCAS Disposition CSW [facility]/TTS [phone]

## 2020-02-29 NOTE — ED Provider Notes (Signed)
Emergency Medicine Observation Re-evaluation Note  Dalbert Stillings is a 39 y.o. male, seen on rounds today.  Pt initially presented to the ED for complaints of Delusional Currently, the patient is laying in bed, no distress.  Physical Exam  BP 107/70   Pulse (!) 59   Temp 98.8 F (37.1 C) (Oral)   Resp 15   SpO2 99%  Physical Exam General: Patient is awake, laying in bed, no obvious distress Cardiac: Normal rate Lungs: Respirations clear and unlabored. Psych: Patient has been refusing vitals.  ED Course / MDM  EKG:    I have reviewed the labs performed to date as well as medications administered while in observation.  Recent changes in the last 24 hours include Placed at [facility], excepted after 2 PM today.  Plan  Current plan is for Inpatient admission. Patient is under full IVC at this time.     Cristina Gong, PA-C 02/29/20 1409    Linwood Dibbles, MD 03/05/20 [phone]

## 2020-02-29 NOTE — ED Notes (Signed)
Ordered breakfast 

## 2020-02-29 NOTE — ED Notes (Signed)
Report given to Marilynn Latino, RN at [facility] in [place]. Accepting MD is Dr. Graciela Husbands.

## 2020-02-29 NOTE — Progress Notes (Signed)
Pt accepted to [facility]  Dr. Lorinda Creed is the attending/accepting provider.    Call report to [phone]  Clydie Braun @ [facility] ED notified.     Pt is under IVC and will be transported by Patent examiner.   Pt is scheduled to arrive at [facility] after 2pm today.     Wells Guiles, LCSW, LCAS Disposition CSW [facility]/TTS [phone]

## 2020-03-01 DIAGNOSIS — I10 Essential (primary) hypertension: Secondary | ICD-10-CM | POA: Diagnosis not present

## 2020-03-01 DIAGNOSIS — Z720 Tobacco use: Secondary | ICD-10-CM | POA: Diagnosis not present

## 2020-03-01 DIAGNOSIS — F209 Schizophrenia, unspecified: Secondary | ICD-10-CM | POA: Diagnosis not present

## 2020-03-01 DIAGNOSIS — F29 Unspecified psychosis not due to a substance or known physiological condition: Secondary | ICD-10-CM | POA: Diagnosis not present

## 2020-03-02 DIAGNOSIS — F209 Schizophrenia, unspecified: Secondary | ICD-10-CM | POA: Diagnosis not present

## 2020-03-03 DIAGNOSIS — F209 Schizophrenia, unspecified: Secondary | ICD-10-CM | POA: Diagnosis not present

## 2020-03-04 DIAGNOSIS — F209 Schizophrenia, unspecified: Secondary | ICD-10-CM | POA: Diagnosis not present

## 2020-03-05 DIAGNOSIS — F209 Schizophrenia, unspecified: Secondary | ICD-10-CM | POA: Diagnosis not present

## 2020-03-06 DIAGNOSIS — F209 Schizophrenia, unspecified: Secondary | ICD-10-CM | POA: Diagnosis not present

## 2020-03-07 DIAGNOSIS — F209 Schizophrenia, unspecified: Secondary | ICD-10-CM | POA: Diagnosis not present

## 2020-03-08 DIAGNOSIS — F209 Schizophrenia, unspecified: Secondary | ICD-10-CM | POA: Diagnosis not present

## 2020-03-09 DIAGNOSIS — F209 Schizophrenia, unspecified: Secondary | ICD-10-CM | POA: Diagnosis not present

## 2020-03-10 DIAGNOSIS — F209 Schizophrenia, unspecified: Secondary | ICD-10-CM | POA: Diagnosis not present

## 2020-03-11 DIAGNOSIS — F209 Schizophrenia, unspecified: Secondary | ICD-10-CM | POA: Diagnosis not present

## 2020-03-12 DIAGNOSIS — F209 Schizophrenia, unspecified: Secondary | ICD-10-CM | POA: Diagnosis not present

## 2020-03-13 DIAGNOSIS — F209 Schizophrenia, unspecified: Secondary | ICD-10-CM | POA: Diagnosis not present

## 2020-03-14 DIAGNOSIS — F209 Schizophrenia, unspecified: Secondary | ICD-10-CM | POA: Diagnosis not present

## 2020-03-15 DIAGNOSIS — F209 Schizophrenia, unspecified: Secondary | ICD-10-CM | POA: Diagnosis not present

## 2020-11-17 ENCOUNTER — Other Ambulatory Visit: Payer: Self-pay

## 2020-11-17 ENCOUNTER — Ambulatory Visit (HOSPITAL_COMMUNITY)
Admission: EM | Admit: 2020-11-17 | Discharge: 2020-11-18 | Disposition: A | Discharge status: Other Acute Inpatient Hospital | Payer: Medicare Other | Source: Home / Self Care | Attending: Psychiatry | Admitting: Psychiatry

## 2020-11-17 ENCOUNTER — Encounter (HOSPITAL_COMMUNITY): Payer: Self-pay | Admitting: Emergency Medicine

## 2020-11-17 ENCOUNTER — Emergency Department (HOSPITAL_COMMUNITY)
Admission: EM | Admit: 2020-11-17 | Discharge: 2020-11-17 | Disposition: A | Discharge status: Other Acute Inpatient Hospital | Payer: Medicare Other | Attending: Emergency Medicine | Admitting: Emergency Medicine

## 2020-11-17 DIAGNOSIS — F17210 Nicotine dependence, cigarettes, uncomplicated: Secondary | ICD-10-CM | POA: Insufficient documentation

## 2020-11-17 DIAGNOSIS — F200 Paranoid schizophrenia: Secondary | ICD-10-CM

## 2020-11-17 DIAGNOSIS — I445 Left posterior fascicular block: Secondary | ICD-10-CM | POA: Insufficient documentation

## 2020-11-17 DIAGNOSIS — Z20822 Contact with and (suspected) exposure to covid-19: Secondary | ICD-10-CM | POA: Diagnosis not present

## 2020-11-17 DIAGNOSIS — F201 Disorganized schizophrenia: Secondary | ICD-10-CM | POA: Diagnosis not present

## 2020-11-17 LAB — COMPREHENSIVE METABOLIC PANEL WITH GFR
ALT: 19 U/L (ref 0–44)
AST: 20 U/L (ref 15–41)
Albumin: 5.1 g/dL — ABNORMAL HIGH (ref 3.5–5.0)
Alkaline Phosphatase: 64 U/L (ref 38–126)
Anion gap: 8 (ref 5–15)
BUN: 18 mg/dL (ref 6–20)
CO2: 23 mmol/L (ref 22–32)
Calcium: 9.5 mg/dL (ref 8.9–10.3)
Chloride: 110 mmol/L (ref 98–111)
Creatinine, Ser: 0.91 mg/dL (ref 0.61–1.24)
GFR, Estimated: >60 mL/min
Glucose, Bld: 91 mg/dL (ref 70–99)
Potassium: 3.5 mmol/L (ref 3.5–5.1)
Sodium: 141 mmol/L (ref 135–145)
Total Bilirubin: 0.8 mg/dL (ref 0.3–1.2)
Total Protein: 8.5 g/dL — ABNORMAL HIGH (ref 6.5–8.1)

## 2020-11-17 LAB — CBC
HCT: 45.4 % (ref 39.0–52.0)
Hemoglobin: 14.3 g/dL (ref 13.0–17.0)
MCH: 26.6 pg (ref 26.0–34.0)
MCHC: 31.5 g/dL (ref 30.0–36.0)
MCV: 84.4 fL (ref 80.0–100.0)
Platelets: 237 K/uL (ref 150–400)
RBC: 5.38 MIL/uL (ref 4.22–5.81)
RDW: 13.1 % (ref 11.5–15.5)
WBC: 6.1 K/uL (ref 4.0–10.5)
nRBC: 0 % (ref 0.0–0.2)

## 2020-11-17 LAB — SALICYLATE LEVEL: Salicylate Lvl: <7 mg/dL — ABNORMAL LOW (ref 7.0–30.0)

## 2020-11-17 LAB — RESP PANEL BY RT-PCR (FLU A&B, COVID) ARPGX2
Influenza A by PCR: NEGATIVE
Influenza B by PCR: NEGATIVE
SARS Coronavirus 2 by RT PCR: NEGATIVE

## 2020-11-17 LAB — ACETAMINOPHEN LEVEL: Acetaminophen (Tylenol), Serum: <10 ug/mL — ABNORMAL LOW (ref 10–30)

## 2020-11-17 LAB — ETHANOL: Alcohol, Ethyl (B): <10 mg/dL (ref <10)

## 2020-11-17 MED ORDER — TRAZODONE HCL 100 MG PO TABS
100.0000 mg | ORAL_TABLET | Freq: Every day | ORAL | Status: DC
  Filled 2020-11-17: qty 1

## 2020-11-17 MED ORDER — TRAZODONE HCL 50 MG PO TABS: 50.0000 mg | ORAL_TABLET | Freq: Every evening | ORAL | Status: DC | PRN

## 2020-11-17 MED ORDER — MAGNESIUM HYDROXIDE 400 MG/5ML PO SUSP: 30.0000 mL | Freq: Every day | ORAL | Status: DC | PRN

## 2020-11-17 MED ORDER — NICOTINE 21 MG/24HR TD PT24
21.0000 mg | MEDICATED_PATCH | Freq: Once | TRANSDERMAL | Status: AC
  Administered 2020-11-17: 21 mg via TRANSDERMAL
  Filled 2020-11-17: qty 1

## 2020-11-17 MED ORDER — HYDROXYZINE HCL 25 MG PO TABS: 25.0000 mg | ORAL_TABLET | Freq: Three times a day (TID) | ORAL | Status: DC | PRN

## 2020-11-17 MED ORDER — ACETAMINOPHEN 325 MG PO TABS
650.0000 mg | ORAL_TABLET | Freq: Four times a day (QID) | ORAL | Status: DC | PRN
Start: 2020-11-17 — End: 2020-11-19

## 2020-11-17 MED ORDER — BENZTROPINE MESYLATE 1 MG PO TABS
1.0000 mg | ORAL_TABLET | Freq: Two times a day (BID) | ORAL | Status: DC
  Administered 2020-11-17: 1 mg via ORAL
  Filled 2020-11-17 (×3): qty 1

## 2020-11-17 MED ORDER — ALUM & MAG HYDROXIDE-SIMETH 200-200-20 MG/5ML PO SUSP: 30.0000 mL | ORAL | Status: DC | PRN

## 2020-11-17 MED ORDER — RISPERIDONE 3 MG PO TABS
6.0000 mg | ORAL_TABLET | Freq: Every day | ORAL | Status: DC
  Filled 2020-11-17: qty 2

## 2020-11-17 NOTE — ED Provider Notes (Signed)
Upheld involuntary commitment. 1st examination completed on 11/17/2020 @11 :00 am

## 2020-11-17 NOTE — ED Notes (Signed)
Pt given sandwich and juice

## 2020-11-17 NOTE — ED Notes (Signed)
Pt changed into hospital provided burgundy scrubs and yellow socks., Belongings gathered and placed in secure locker. Pt updated on IVC status.

## 2020-11-17 NOTE — ED Notes (Signed)
Pt is seeing objects near the ceiling but unable to describe what he is seeing. Unsure about to eat cereal. Asking other staff members if he should eat. Stated that he can't eat because his family is not here. Keeps asking for a cigarette. Explained to Pt that cigarettes are not allowed and offered to ask for an order for the nicorette gum. Pt refused and stated that he will wait until he leaves so he can get a cigarette. Pt is sitting on the side of the pull out bed gazing at the cereal not eating anything. Safety maintained and will continue to monitor.

## 2020-11-17 NOTE — ED Notes (Signed)
Pt awake, alert & responsive, no distress noted, Pt under IVC by mother, exhibited aggressive behavior towards her.  Noncompliant with medications, feels people are trying to kill him.  Pt guarded, not forthcoming with information.  Skin search completed, monitoring for safety.

## 2020-11-17 NOTE — ED Notes (Addendum)
Pt refused all bedtime medications and states, "I don't need no medicine, I'm tired."

## 2020-11-17 NOTE — ED Notes (Signed)
PD present to transport pt. All belongings gathered and given for transport. Sent with IVC papers. Pt alert and ambulatory.

## 2020-11-17 NOTE — ED Notes (Signed)
Pt resting in bed. A&O x4, calm and cooperative. No signs of acute distress noted. Will continue to monitor for safety.  

## 2020-11-17 NOTE — ED Provider Notes (Signed)
[facility]  CSN: [id number] Arrival date & time: 11/17/20 0016  Chief Complaint(s) Urgent Emergent Eval IVC  HPI [name] [name] is a 40 y.o. 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  Patient is noncompliant with medications.  Disorganized thought process and unable to cooperate with history.  Remainder of history, ROS, and physical exam limited due to patient's condition (psychiatric disorder). Additional information was obtained from [employer] and IVC paperwork.   Level V Caveat.    HPI  Past Medical History Past Medical History:  Diagnosis Date  . Anxiety   . Depression   . Mental disorder   . Schizophrenia [facility])    Patient Active Problem List   Diagnosis Date Noted  . 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 Schizophrenia (HCC) 01/17/2019  . Psychoactive substance-induced psychosis (HCC)   . Schizoaffective disorder (HCC) 01/13/2019  . 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 Schizoaffective disorder, bipolar type (HCC) 05/07/2016  . Cocaine abuse (HCC) 05/07/2016  . Paranoid schizophrenia (HCC) 04/22/2016  . Tobacco dependence 04/22/2016  . Cannabis use disorder, moderate, in sustained remission (HCC) 04/03/2015  . Alcohol use disorder, moderate, in sustained remission (HCC) 04/03/2015   Home Medication(s) Prior to Admission medications   Medication Sig Start Date End Date Taking? Authorizing Provider  benztropine (COGENTIN) 1 MG tablet Take 1 tablet (1 mg total) by mouth 2 (two) times daily. 11/26/19   Lenard Lance, FNP  risperiDONE (RISPERDAL) 3 MG tablet Take 2 tablets (6 mg total) by mouth at bedtime. 11/26/19   Lenard Lance, FNP  traZODone (DESYREL) 100 MG tablet Take 1 tablet (100 mg total) by mouth at bedtime. 11/26/19   Lenard Lance, FNP                                                                                                                                     Past Surgical History History reviewed. No pertinent surgical history. Family History Family History  Problem Relation Age of Onset  . Mental illness Neg Hx     Social History Social History   Tobacco Use  . Smoking status: Current Every Day Smoker    Packs/day: 1.00    Types: Cigarettes  . Smokeless tobacco: Former Clinical biochemist  . Vaping Use: Unknown  Substance Use Topics  . Alcohol use: Not Currently    Comment: haven't drank in months"  . Drug use: Yes    Types: Marijuana, Methamphetamines    Comment: Per sister, Pt uses meth   Allergies Patient has no known allergies.  Review of Systems Review of Systems Unable to obtain due to psychiatric disorder  Physical Exam Vital Signs  I have reviewed the triage vital signs BP 129/77   Pulse 69   Temp 98.2 F (36.8 C)   Resp 18  Ht 5\' 3"  (1.6 m)   Wt 63.5 kg   SpO2 99%   BMI 24.80 kg/m   Physical Exam Vitals reviewed.  Constitutional:      General: He is not in acute distress.    Appearance: He is well-developed. He is not diaphoretic.  HENT:     Head: Normocephalic and atraumatic.     Jaw: No trismus.     Right Ear: External ear normal.     Left Ear: External ear normal.     Nose: Nose normal.  Eyes:     General: No scleral icterus.    Conjunctiva/sclera: Conjunctivae normal.  Neck:     Trachea: Phonation normal.  Cardiovascular:     Rate and Rhythm: Normal rate and regular rhythm.  Pulmonary:     Effort: Pulmonary effort is normal. No respiratory distress.     Breath sounds: No stridor.  Abdominal:     General: There is no distension.  Musculoskeletal:        General: Normal range of motion.     Cervical back: Normal range of motion.  Neurological:     Mental Status: He is alert and oriented to person, place, and time.  Psychiatric:        Mood and Affect: Mood is anxious.        Speech: Speech is tangential.         Behavior: Behavior normal.     Comments:   Disorganized thought process     ED Results and Treatments Labs (all labs ordered are listed, but only abnormal results are displayed) Labs Reviewed  COMPREHENSIVE METABOLIC PANEL - Abnormal; Notable for the following components:      Result Value   Total Protein 8.5 (*)    Albumin 5.1 (*)    All other components within normal limits  SALICYLATE LEVEL - Abnormal; Notable for the following components:   Salicylate Lvl <7.0 (*)    All other components within normal limits  ACETAMINOPHEN LEVEL - Abnormal; Notable for the following components:   Acetaminophen (Tylenol), Serum <10 (*)    All other components within normal limits  RESP PANEL BY RT-PCR (FLU A&B, COVID) ARPGX2  ETHANOL  CBC  RAPID URINE DRUG SCREEN, HOSP PERFORMED                                                                                                                         EKG  EKG Interpretation  Date/Time:    Ventricular Rate:    PR Interval:    QRS Duration:   QT Interval:    QTC Calculation:   R Axis:     Text Interpretation:        Radiology No results found.  Pertinent labs & imaging results that were available during my care of the patient were reviewed by me and considered in my medical decision making (see chart for details).  Medications Ordered in ED Medications - No data to display  Procedures Procedures  (including critical care time)  Medical Decision Making / ED Course I have reviewed the nursing notes for this encounter and the patient's prior records (if available in EHR or on provided paperwork).   [name] [name] was evaluated in Emergency Department on 11/17/2020 for the symptoms described in the history of present illness. He was evaluated in the context of the global COVID-19 pandemic, which necessitated  consideration that the patient might be at risk for infection with the SARS-CoV-2 virus that causes COVID-19. Institutional protocols and algorithms that pertain to the evaluation of patients at risk for COVID-19 are in a state of rapid change based on information released by regulatory bodies including the CDC and federal and state organizations. These policies and algorithms were followed during the patient's care in the ED.  IVC upheld. Further exam paperwork filled out Patient with disorganized schizophrenia Screening labs obtain and reassuring.  Medically cleared for behavioral health disposition.  Transfer to behavioral health urgent care      Final Clinical Impression(s) / ED Diagnoses Final diagnoses:  Disorganized schizophrenia (HCC)      This chart was dictated using voice recognition software.  Despite best efforts to proofread,  errors can occur which can change the documentation meaning.   Nira Conn, MD 11/17/20 [phone]

## 2020-11-17 NOTE — ED Triage Notes (Signed)
Pt BIB [employer] for IVC. Papers state, "[name] has been diagnosed with schizophrenia and is not medication compliant. He threatened his mother tonight and exhibited aggressive behavior toward her. He sees things that aren't there and refuses to take medications because people are trying to kill him." Calm at this time.

## 2020-11-17 NOTE — BH Assessment (Signed)
Comprehensive Clinical Assessment (CCA) Note  11/17/2020 [name] [name] [id number]  DISPOSITION: Gave clinical report to [name] Abts, PA-C who recommended Pt be transferred to [facility] for continuous assessment. Notified Dr. Amadeo Garnet [name] and [name] Fridge, RN of recommendation. Notified [facility] staff of pending transfer.  The patient demonstrates the following risk factors for suicide: Chronic risk factors for suicide include: psychiatric disorder of schizophrenia. Acute risk factors for suicide include: social withdrawal/isolation. Protective factors for this patient include: responsibility to others (children, family). Considering these factors, the overall suicide risk at this point appears to be low. Patient is appropriate for outpatient follow up.  Flowsheet Row ED from 11/17/2020 in [facility] from 02/28/2020 in [facility]ERGENCY DEPART[facility] ED from 11/24/2019 in [facility]  C-SSRS RISK CATEGORY No Risk Error: Q6 is Yes, you must answer 7 High Risk     Pt is a 40 year old single male who presents unaccompanied to [facility] via Patent examiner after being petitioned for involuntary commitment by his mother, [name] [name] [phone]. Affidavit and petition states: "[name] has been diagnosed with schizophrenia and is not medication compliant. He threatened his mother tonight and exhibited aggressive behavior toward her. He see things that aren't there and refuses to take medication because people are trying to kill him."  Pt would not respond to any questions during assessment. He sat with his arms crossed and avoided eye contact. Pt's medical record indicates Pt has a diagnosis of schizophrenia and has been petitioned for IVC several times in the past. In previous presentations he has avoided eye contact, refused to answer questions, stated he sees ghosts, and stated that people are trying to kill him.  Pt has a history of using alcohol, marijuana, cocaine, and methamphetamines. Pt has a history of medication noncompliance. Per medical record, he has threatened family members in the past. Pt has been psychiatrically hospitalized multiple times, most recently at [facility] in August 2021. He has also been psychiatrically hospitalized at [facility] and [facility].  TTS contacted Pt's mother/petitioner for collateral information. She says Pt has been sitting in his room in the dark talking to people who are not there or wandering the neighborhood. She says he has not been caring for his hygiene. She reports he has not been sleeping. She states Pt has been eating very little and will not take medication because he believes someone is trying to kill him. She says he has not made any comments or behaved in a way to indicate he wants to harm himself. She says he has been verbally aggressive to her but has not been physically aggressive. She says she does not know whether he has used alcohol or drugs recently. She says to her knowledge he does not have any current mental health providers.   Chief Complaint: No chief complaint on file.  Visit Diagnosis: F20.9 Schizophrenia   CCA Screening, Triage and Referral (STR)  Patient Reported Information How did you hear about [place]? No data recorded Referral name: No data recorded Referral phone number: No data recorded  Whom do you see for routine medical problems? No data recorded Practice/Facility Name: No data recorded Practice/Facility Phone Number: No data recorded Name of Contact: No data recorded Contact Number: No data recorded Contact Fax Number: No data recorded Prescriber Name: No data recorded Prescriber Address (if known): No data recorded  What Is the Reason for Your Visit/Call Today? No data recorded How  Long Has This Been Causing You Problems? No data recorded What Do You Feel Would Help You the Most Today? No data  recorded  Have You Recently Been in Any Inpatient Treatment (Hospital/Detox/Crisis Center/28-Day Program)? No data recorded Name/Location of Program/Hospital:No data recorded How Long Were You There? No data recorded When Were You Discharged? No data recorded  Have You Ever Received Services From [facility] Before? No data recorded Who Do You See at [facility]? No data recorded  Have You Recently Had Any Thoughts About Hurting Yourself? No data recorded Are You Planning to Commit Suicide/Harm Yourself At This time? No data recorded  Have you Recently Had Thoughts About Hurting Someone Karolee Ohs? No data recorded Explanation: No data recorded  Have You Used Any Alcohol or Drugs in the Past 24 Hours? No data recorded How Long Ago Did You Use Drugs or Alcohol? No data recorded What Did You Use and How Much? No data recorded  Do You Currently Have a Therapist/Psychiatrist? No data recorded Name of Therapist/Psychiatrist: No data recorded  Have You Been Recently Discharged From Any Office Practice or Programs? No data recorded Explanation of Discharge From Practice/Program: No data recorded    CCA Screening Triage Referral Assessment Type of Contact: No data recorded Is this Initial or Reassessment? No data recorded Date Telepsych consult ordered in [facility]:  02/28/2020  Time Telepsych consult ordered in [facility]:  [id number]   Patient Reported Information Reviewed? No data recorded Patient Left Without Being Seen? No data recorded Reason for Not Completing Assessment: No data recorded  Collateral Involvement: No data recorded  Does Patient Have a Court Appointed Legal Guardian? No data recorded Name and Contact of Legal Guardian: No data recorded If Minor and Not Living with Parent(s), Who has Custody? No data recorded Is CPS involved or ever been involved? -- (UTA)  Is APS involved or ever been involved? -- (UTA)   Patient Determined To Be At Risk for Harm To Self or Others Based on Review  of Patient Reported Information or Presenting Complaint? No data recorded Method: No data recorded Availability of Means: No data recorded Intent: No data recorded Notification Required: No data recorded Additional Information for Danger to Others Potential: No data recorded Additional Comments for Danger to Others Potential: No data recorded Are There Guns or Other Weapons in Your Home? No data recorded Types of Guns/Weapons: No data recorded Are These Weapons Safely Secured?                            No data recorded Who Could Verify You Are Able To Have These Secured: No data recorded Do You Have any Outstanding Charges, Pending Court Dates, Parole/Probation? No data recorded Contacted To Inform of Risk of Harm To Self or Others: No data recorded  Location of Assessment: [facility] ED   Does Patient Present under Involuntary Commitment? No data recorded IVC Papers Initial File Date: No data recorded  [place] of Residence: No data recorded  Patient Currently Receiving the Following Services: No data recorded  Determination of Need: No data recorded  Options For Referral: No data recorded    CCA Biopsychosocial Intake/Chief Complaint:  Pt has diagnosis of schizophrenia and has been petitioned for IVC by his mother. She reports Pt has not been taking medication, responding to internal stimuli, and not caring for himself.  Current Symptoms/Problems: Pt will not respond to staff. Pt's mother reports Pt is wandering the neighborhood, talking to people  who are not there, not caring for hygiene, not sleeping, and eating very little. She says Pt believes people are trying to kill him.   Patient Reported Schizophrenia/Schizoaffective Diagnosis in Past: Yes   Strengths: NA  Preferences: NA  Abilities: NA   Type of Services Patient Feels are Needed: NA   Initial Clinical Notes/Concerns: Pt will not respond to any questions.   Mental Health Symptoms Depression:  Change in  energy/activity; Difficulty Concentrating; Increase/decrease in appetite   Duration of Depressive symptoms: Greater than two weeks   Mania:  Change in energy/activity   Anxiety:   Worrying; Tension; Restlessness   Psychosis:  Affective flattening/alogia/avolition; Hallucinations   Duration of Psychotic symptoms: Greater than six months   Trauma:  No data recorded  Obsessions:  N/A   Compulsions:  None   Inattention:  None   Hyperactivity/Impulsivity:  N/A   Oppositional/Defiant Behaviors:  N/A   Emotional Irregularity:  None   Other Mood/Personality Symptoms:  NA    Mental Status Exam Appearance and self-care  Stature:  Average   Weight:  Average weight   Clothing:  -- (Scrubs)   Grooming:  Neglected   Cosmetic use:  None   Posture/gait:  Tense   Motor activity:  Not Remarkable   Sensorium  Attention:  -- (Unable to assess. Pt will not respond.)   Concentration:  No data recorded  Orientation:  No data recorded  Recall/memory:  No data recorded  Affect and Mood  Affect:  -- (Unable to assess. Pt will not respond.)   Mood:  -- (Unable to assess. Pt will not respond.)   Relating  Eye contact:  Avoided   Facial expression:  Tense   Attitude toward examiner:  Resistant; Guarded   Thought and Language  Speech flow: Mute   Thought content:  -- (Unable to assess. Pt will not respond.)   Preoccupation:  -- (Unable to assess. Pt will not respond.)   Hallucinations:  Visual; Auditory   Organization:  No data recorded  [employer] of Knowledge:  Average   Intelligence:  Average   Abstraction:  -- (Unable to assess. Pt will not respond.)   Judgement:  -- (Unable to assess. Pt will not respond.)   Reality Testing:  -- (Unable to assess. Pt will not respond.)   Insight:  -- (Unable to assess. Pt will not respond.)   Decision Making:  -- (Unable to assess. Pt will not respond.)   Social Functioning  Social Maturity:  Isolates    Social Judgement:  Naive   Stress  Stressors:  Other (Comment) (Unable to assess. Pt will not respond.)   Coping Ability:  -- (Unable to assess. Pt will not respond.)   Skill Deficits:  Responsibility; Self-care   Supports:  Family     Religion: Religion/Spirituality Are You A Religious Person?:  (Unable to assess. Pt will not respond.)  Leisure/Recreation: Leisure / Recreation Do You Have Hobbies?:  (Unable to assess. Pt will not respond.)  Exercise/Diet: Exercise/Diet Do You Exercise?:  (Unable to assess. Pt will not respond.) Have You Gained or Lost A Significant Amount of Weight in the Past Six Months?:  (Unable to assess. Pt will not respond.) Do You Follow a Special Diet?:  (Unable to assess. Pt will not respond.) Do You Have Any Trouble Sleeping?: Yes Explanation of Sleeping Difficulties: Mother reports Pt has not been sleeping.   CCA Employment/Education Employment/Work Situation: Employment / Work Situation Employment situation: On disability Why is patient on  disability: Mental health How long has patient been on disability: Since 2003 What is the longest time patient has a held a job?: 2011-2017 Where was the patient employed at that time?: Restaurant, waiting tables Has patient ever been in the [employer] and Companymilitary?: No  Education:     CCA Family/Childhood History Family and Relationship History: Family history Marital status: Single What is your sexual orientation?: Straight Does patient have children?: Yes How many children?: 1 How is patient's relationship with their children?: Per medical record, Pt has a daughter  Childhood History:  Childhood History By whom was/is the patient raised?: Both parents Additional childhood history information: Came to [place] from [place] with both of his parents at age 89yo.  His parents are [place]; had to flee their country because his father worked with the [employer] military. Does patient have siblings?:  (Unable to  assess. Pt will not respond.) Did patient suffer any verbal/emotional/physical/sexual abuse as a child?:  (Unable to assess. Pt will not respond.) Did patient suffer from severe childhood neglect?:  (Unable to assess. Pt will not respond.) Has patient ever been sexually abused/assaulted/raped as an adolescent or adult?:  (Unable to assess. Pt will not respond.) Was the patient ever a victim of a crime or a disaster?:  (Unable to assess. Pt will not respond.) Witnessed domestic violence?:  (Unable to assess. Pt will not respond.) Has patient been affected by domestic violence as an adult?:  (Unable to assess. Pt will not respond.)  Child/Adolescent Assessment:     CCA Substance Use Alcohol/Drug Use:                           ASAM's:  Six Dimensions of Multidimensional Assessment  Dimension 1:  Acute Intoxication and/or Withdrawal Potential:      Dimension 2:  Biomedical Conditions and Complications:      Dimension 3:  Emotional, Behavioral, or Cognitive Conditions and Complications:     Dimension 4:  Readiness to Change:     Dimension 5:  Relapse, Continued use, or Continued Problem Potential:     Dimension 6:  Recovery/Living Environment:     ASAM Severity Score:    ASAM Recommended Level of Treatment:     Substance use Disorder (SUD)    Recommendations for Services/Supports/Treatments:    DSM5 Diagnoses: Patient Active Problem List   Diagnosis Date Noted  . 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 Schizophrenia (HCC) 01/17/2019  . Psychoactive substance-induced psychosis (HCC)   . Schizoaffective disorder (HCC) 01/13/2019  . 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 Schizoaffective disorder, bipolar type (HCC) 05/07/2016  . Cocaine abuse (HCC) 05/07/2016  . Paranoid schizophrenia (HCC) 04/22/2016  . Tobacco dependence 04/22/2016  . Cannabis use disorder, moderate, in sustained remission (HCC) 04/03/2015  . Alcohol use  disorder, moderate, in sustained remission (HCC) 04/03/2015    Patient Centered Plan: Patient is on the following Treatment Plan(s):  Anxiety   Referrals to Alternative Service(s): Referred to Alternative Service(s):   Place:   Date:   Time:    Referred to Alternative Service(s):   Place:   Date:   Time:    Referred to Alternative Service(s):   Place:   Date:   Time:    Referred to Alternative Service(s):   Place:   Date:   Time:     [name] [name], [name] [name], [facility]

## 2020-11-17 NOTE — ED Notes (Signed)
[employer] notified of pt transfer to [facility], hospital for transport due to IVC.

## 2020-11-17 NOTE — ED Notes (Signed)
TTS assessment in progress. 

## 2020-11-17 NOTE — ED Notes (Signed)
Staff asked pt if he would like anything for lunch and offered options. Pt continues to be confused about if he should eat and did not give an answer. Writer told pt to let staff know of any needs. Pt verbalized understanding.

## 2020-11-17 NOTE — ED Provider Notes (Addendum)
Behavioral Health Admission [facility](FBC & OBS)  Date: 11/17/20 Patient Name: [name] [name] MRN: [id number] Chief Complaint:  Chief Complaint  Patient presents with  . IVC    Aggressive Behavior      Diagnoses:  Final diagnoses:  None    HPI: [name] ItoSimon Pronovost is a 39y/o male. [facility] paperwork petitioned by his mother Hlim Dooner. [facility] Patient is alert, calm, uncooperative, and maintains minimal eye contact.   Patient refused to answer assessment questions and responded "I don't know" to all question asked by this Clinical research associatewriter. Patient did not appear to be in any acute distress, he did not complain of SOB, chest pain/discomfort, dizziness, Gi/GU symptoms, or headache.  Per Venda RodesFord Warrick 11/17/2020@ 03:33: TTS contacted Pt's mother/petitioner for collateral information. She says Pt has been sitting in his room in the dark talking to people who are not there or wandering the neighborhood. She says he has not been caring for his hygiene. She reports he has not been sleeping. She states Pt has been eating very little and will not take medication because he believes someone is trying to kill him. She says he has not made any comments or behaved in a way to indicate he wants to harm himself. She says he has been verbally aggressive to her but has not been physically aggressive. She says she does not know whether he has used alcohol or drugs recently. She says to her knowledge he does not have any current mental health providers.    PHQ 2-9:   [facility] Row [facility] Most recent reading at 11/17/2020  5:36 AM [facility] Most recent reading at 11/17/2020  2:06 AM ED [facility]28/2020 [facility] Most recent reading at 02/28/2020  4:04 AM  C-SSRS RISK CATEGORY No Risk No Risk Error: Q6  is Yes, you must answer 7       Total Time spent with patient: 20 minutes  Musculoskeletal  Strength & Muscle Tone: within normal limits Gait & Station: normal Patient leans: Right  Psychiatric Specialty Exam  Presentation General Appearance: Appropriate for Environment  Eye Contact:Minimal  Speech:Blocked  Speech Volume:Normal  Handedness:No data recorded  Mood and Affect  Mood:Irritable  Affect:Congruent   Thought Process  Thought Processes:-- (unable to assess)  Descriptions of Associations:-- (unable to assess)  Orientation:-- (unable to assess)  Thought Content:-- (unable to assess)  Diagnosis of Schizophrenia or Schizoaffective disorder in past: Yes  Duration of Psychotic Symptoms: Greater than six months  Hallucinations:Hallucinations: -- (unable to assess, refused to answer)  Ideas of Reference:No data recorded Suicidal Thoughts:Suicidal Thoughts: -- (unable to assess, refused to answer)  Homicidal Thoughts:Homicidal Thoughts: -- (unable to assess, refused to answer)   Sensorium  Memory:No data recorded Judgment:No data recorded Insight:No data recorded  Executive Functions  Concentration:-- (unable to assess)  Attention Span:No data recorded Recall:-- (unable to assess, refused to answer)  Fund of Knowledge:No data recorded Language:No data recorded  Psychomotor Activity  Psychomotor Activity:Psychomotor Activity: -- (unable to assess, refused to answer)   Assets  Assets:-- (unable to assess, refused to answer)   Sleep  Sleep:Sleep: -- (unable to assess, refused to answer)   No data recorded  Physical Exam Vitals and nursing note reviewed.  Constitutional:      Appearance: He is well-developed.  HENT:  Head: Normocephalic and atraumatic.  Eyes:     Conjunctiva/sclera: Conjunctivae normal.  Cardiovascular:     Rate and Rhythm: Normal rate.  Pulmonary:     Effort: Pulmonary effort is normal. No respiratory distress.      Breath sounds: Normal breath sounds.  Abdominal:     Palpations: Abdomen is soft.     Tenderness: There is no abdominal tenderness.  Musculoskeletal:     Cervical back: Neck supple.  Skin:    General: Skin is warm and dry.  Neurological:     Mental Status: He is alert.     Comments: Unable to assess, pt refused to answer    Psychiatric:        Mood and Affect: Affect is blunt and flat.        Speech: He is noncommunicative.        Behavior: Behavior is uncooperative.     Comments:       Review of Systems  Reason unable to perform ROS: Unable to assess, pt refused   Constitutional: Negative for chills and fever.       Unable to assess, pt refused   HENT:       Unable to assess, pt refused   Eyes:       Unable to assess, pt refused   Respiratory: Negative for cough.        Unable to assess, pt refused   Cardiovascular: Negative for chest pain.       Unable to assess, pt refused   Gastrointestinal:       Unable to assess, pt refused   Genitourinary:       Unable to assess, pt refused   Musculoskeletal:       Unable to assess, pt refused   Skin:       Unable to assess, pt refused   Neurological:       Unable to assess, pt refused   Endo/Heme/Allergies:       Unable to assess, pt refused   Psychiatric/Behavioral:       Unable to assess, pt refused     Blood pressure 132/88, pulse 61, temperature 97.7 F (36.5 C), resp. rate 18, SpO2 97 %. There is no height or weight on file to calculate BMI.  Past Psychiatric History:    Is the patient at risk to self? No  Has the patient been a risk to self in the past 6 months? No .    Has the patient been a risk to self within the distant past? No   Is the patient a risk to others? Yes   Has the patient been a risk to others in the past 6 months? No   Has the patient been a risk to others within the distant past? No   Past Medical History:  Past Medical History:  Diagnosis Date  . Anxiety   . Depression   . Mental  disorder   . Schizophrenia (HCC)    No past surgical history on file.  Family History:  Family History  Problem Relation Age of Onset  . Mental illness Neg Hx     Social History:  Social History   Socioeconomic History  . Marital status: Married    Spouse name: Not on file  . Number of children: 1  . Years of education: Not on file  . Highest education level: Not on file  Occupational History  . Occupation: Disabled  Tobacco Use  . Smoking status: Current Every Day  Smoker    Packs/day: 1.00    Types: Cigarettes  . Smokeless tobacco: Former Clinical biochemist  . Vaping Use: Unknown  Substance and Sexual Activity  . Alcohol use: Not Currently    Comment: haven't drank in months"  . 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 Sexual activity: Yes    Birth control/protection: None  Other Topics Concern  . Not on file  Social History Narrative   Pt lives with parents and sister.  He is married, but currently separated.  Pt is on disability.   Social Determinants of Health   Financial Resource Strain: Not on file  Food Insecurity: Not on file  Transportation Needs: Not on file  Physical Activity: Not on file  Stress: Not on file  Social Connections: Not on file  Intimate Partner Violence: Not on file    SDOH:  SDOH Screenings   Alcohol Screen: Not on file  Depression (UUV2-5): Not on file  Financial Resource Strain: Not on file  Food Insecurity: Not on file  Housing: Not on file  Physical Activity: Not on file  Social Connections: Not on file  Stress: Not on file  Tobacco Use: High Risk  . Smoking Tobacco Use: Current Every Day Smoker  . Smokeless Tobacco Use: Former Dispensing optician Needs: Not on file    Last Labs:  Admission on 11/17/2020, Discharged on 11/17/2020  Component Date Value Ref Range Status  . Sodium 11/17/2020 141  135 - 145 mmol/L Final  . Potassium 11/17/2020 3.5  3.5 - 5.1 mmol/L  Final  . Chloride 11/17/2020 110  98 - 111 mmol/L Final  . CO2 11/17/2020 23  22 - 32 mmol/L Final  . Glucose, Bld 11/17/2020 91  70 - 99 mg/dL Final   Glucose reference range applies only to samples taken after fasting for at least 8 hours.  . BUN 11/17/2020 18  6 - 20 mg/dL Final  . Creatinine, Ser 11/17/2020 0.91  0.61 - 1.24 mg/dL Final  . Calcium 36/64/4034 9.5  8.9 - 10.3 mg/dL Final  . Total Protein 11/17/2020 8.5* 6.5 - 8.1 g/dL Final  . Albumin 74/25/9563 5.1* 3.5 - 5.0 g/dL Final  . AST 87/56/4332 20  15 - 41 U/L Final  . ALT 11/17/2020 19  0 - 44 U/L Final  . Alkaline Phosphatase 11/17/2020 64  38 - 126 U/L Final  . Total Bilirubin 11/17/2020 0.8  0.3 - 1.2 mg/dL Final  . GFR, Estimated 11/17/2020 >60  >60 mL/min Final   Comment: (NOTE) Calculated using the CKD-EPI Creatinine Equation (2021)   . Anion gap 11/17/2020 8  5 - 15 Final   Performed at [facility], [address], [place], [place] [address]  . Alcohol, Ethyl (B) 11/17/2020 <10  <10 mg/dL Final   Comment: (NOTE) Lowest detectable limit for serum alcohol is 10 mg/dL.  For medical purposes only. Performed at [facility], [address], [place], [place] [address]   . Salicylate Lvl 11/17/2020 <7.0* 7.0 - 30.0 mg/dL Final   Performed at [facility], [address], [place], [place] [address]  . Acetaminophen (Tylenol), Serum 11/17/2020 <10* 10 - 30 ug/mL Final   Comment: (NOTE) Therapeutic concentrations vary significantly. A range of 10-30 ug/mL  may be an effective concentration for many patients. However, some  are best treated at concentrations outside of this range. Acetaminophen concentrations >150  ug/mL at 4 hours after ingestion  and >50 ug/mL at 12 hours after ingestion are often associated with  toxic reactions.  Performed at [facility], [address] [place], [place] [address]   . WBC 11/17/2020 6.1  4.0 - 10.5  K/uL Final  . RBC 11/17/2020 5.38  4.22 - 5.81 MIL/uL Final  . Hemoglobin 11/17/2020 14.3  13.0 - 17.0 g/dL Final  . HCT 54/56/2563 45.4  39.0 - 52.0 % Final  . MCV 11/17/2020 84.4  80.0 - 100.0 fL Final  . MCH 11/17/2020 26.6  26.0 - 34.0 pg Final  . MCHC 11/17/2020 31.5  30.0 - 36.0 g/dL Final  . RDW 89/37/3428 13.1  11.5 - 15.5 % Final  . Platelets 11/17/2020 237  150 - 400 K/uL Final  . nRBC 11/17/2020 0.0  0.0 - 0.2 % Final   Performed at [facility], [address], [place], [place] [address]  . SARS Coronavirus 2 by RT PCR 11/17/2020 NEGATIVE  NEGATIVE Final   Comment: (NOTE) SARS-CoV-2 target nucleic acids are NOT DETECTED.  The SARS-CoV-2 RNA is generally detectable in upper respiratory specimens during the acute phase of infection. The lowest concentration of SARS-CoV-2 viral copies this assay can detect is 138 copies/mL. A negative result does not preclude SARS-Cov-2 infection and should not be used as the sole basis for treatment or other patient management decisions. A negative result may occur with  improper specimen collection/handling, submission of specimen other than nasopharyngeal swab, presence of viral mutation(s) within the areas targeted by this assay, and inadequate number of viral copies(<138 copies/mL). A negative result must be combined with clinical observations, patient history, and epidemiological information. The expected result is Negative.  Fact Sheet for Patients:  BloggerCourse.com  Fact Sheet for Healthcare Providers:  SeriousBroker.it  This test is no                          t yet approved or cleared by the [place] FDA and  has been authorized for detection and/or diagnosis of SARS-CoV-2 by FDA under an Emergency Use Authorization (EUA). This EUA will remain  in effect (meaning this test can be used) for the duration of the COVID-19 declaration under Section  564(b)(1) of the Act, 21 U.S.C.section 360bbb-3(b)(1), unless the authorization is terminated  or revoked sooner.      . Influenza A by PCR 11/17/2020 NEGATIVE  NEGATIVE Final  . Influenza B by PCR 11/17/2020 NEGATIVE  NEGATIVE Final   Comment: (NOTE) The Xpert Xpress SARS-CoV-2/FLU/RSV plus assay is intended as an aid in the diagnosis of influenza from Nasopharyngeal swab specimens and should not be used as a sole basis for treatment. Nasal washings and aspirates are unacceptable for Xpert Xpress SARS-CoV-2/FLU/RSV testing.  Fact Sheet for Patients: BloggerCourse.com  Fact Sheet for Healthcare Providers: SeriousBroker.it  This test is not yet approved or cleared by the [place] FDA and has been authorized for detection and/or diagnosis of SARS-CoV-2 by FDA under an Emergency Use Authorization (EUA). This EUA will remain in effect (meaning this test can be used) for the duration of the COVID-19 declaration under Section 564(b)(1) of the Act, 21 U.S.C. section 360bbb-3(b)(1), unless the authorization is terminated or revoked.  Performed at [facility], [address] [place], [place] [address]     Allergies: Patient has no known allergies.  PTA Medications: (Not in a hospital admission)   Medical Decision Making  Patient admitted under IVC due to psychosis and noncompliance with medication. Patient is currently uncooperative with assessment. Admit to [facility] for observation with continuous assessment. Patient to be reevaluated on day shift on 11/16/2020 by psychiatry.      Recommendations  Based on my evaluation the patient does not appear to have an emergency medical condition.  Maricela Bo, NP 11/17/20  6:18 AM

## 2020-11-17 NOTE — ED Notes (Signed)
Pt not speaking to staff or TTS.

## 2020-11-18 DIAGNOSIS — F201 Disorganized schizophrenia: Secondary | ICD-10-CM | POA: Diagnosis not present

## 2020-11-18 DIAGNOSIS — F200 Paranoid schizophrenia: Secondary | ICD-10-CM | POA: Diagnosis not present

## 2020-11-18 DIAGNOSIS — I445 Left posterior fascicular block: Secondary | ICD-10-CM | POA: Diagnosis not present

## 2020-11-18 DIAGNOSIS — Z20822 Contact with and (suspected) exposure to covid-19: Secondary | ICD-10-CM | POA: Diagnosis not present

## 2020-11-18 DIAGNOSIS — F17210 Nicotine dependence, cigarettes, uncomplicated: Secondary | ICD-10-CM | POA: Diagnosis not present

## 2020-11-18 MED ORDER — RISPERIDONE 3 MG PO TABS: 6.0000 mg | ORAL_TABLET | Freq: Every day | ORAL | Status: DC

## 2020-11-18 MED ORDER — BENZTROPINE MESYLATE 1 MG PO TABS: 1.0000 mg | ORAL_TABLET | Freq: Two times a day (BID) | ORAL | Status: DC

## 2020-11-18 MED ORDER — TRAZODONE HCL 100 MG PO TABS: 100.0000 mg | ORAL_TABLET | Freq: Every day | ORAL | Status: DC

## 2020-11-18 NOTE — ED Provider Notes (Signed)
FBC/OBS ASAP Discharge Summary  Date and Time: 11/18/2020 4:35 PM  Name: [name] [name]  MRN:  [id number]   Discharge Diagnoses:  Final diagnoses:  Paranoid schizophrenia [facility])    Subjective:  40 yo male with history of schizophrenia who presented to the [facility] under IVC for aggressive behavior in the setting of medication non compliance.   Stay Summary:   [name] [name] is a 39y/o male. [facility] under IVC paperwork petitioned by his mother Hlim Greiner. Patient was assessed upon arrival to [facility]. Patient is alert, calm, uncooperative, and maintains minimal eye contact.   Patient refused to answer assessment questions and responded "I don't know" to all question asked by this Clinical research associate. Patient did not appear to be in any acute distress, he did not complain of SOB, chest pain/discomfort, dizziness, Gi/GU symptoms, or headache.  Per Venda Rodes 11/17/2020@ 03:33: TTS contacted Pt's mother/petitioner for collateral information. She says Pt has been sitting in his room in the dark talking to people who are not there or wandering the neighborhood. She says he has not been caring for his hygiene. She reports he has not been sleeping. She states Pt has been eating very little and will not take medication because he believes someone is trying to kill him. She says he has not made any comments or behaved in a way to indicate he wants to harm himself. She says he has been verbally aggressive to her but has not been physically aggressive. She says she does not know whether he has used alcohol or drugs recently. She says to her knowledge he does not have any current mental health providers.  Pt was admitted for overnight observation on 5/15. He was restarted on medications but  Pt refused scheduled medications and continued to present psychotic and appeared to be RIS. He was extremelt guarded and minimally participatory. . Pt was accepted to [facility] for further treatment  Total Time  spent with patient: 15 minutes  Past Psychiatric History: see H&P Past Medical History:  Past Medical History:  Diagnosis Date  . Anxiety   . Depression   . Mental disorder   . Schizophrenia (HCC)    No past surgical history on file. Family History:  Family History  Problem Relation Age of Onset  . Mental illness Neg Hx    Family Psychiatric History: see H&P Social History:  Social History   Substance and Sexual Activity  Alcohol Use Not Currently   Comment: haven't drank in months"     Social History   Substance and Sexual Activity  Drug Use Yes  . 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 Marital status: Married    Spouse name: Not on file  . Number of children: 1  . Years of education: Not on file  . Highest education level: Not on file  Occupational History  . Occupation: Disabled  Tobacco Use  . Smoking status: Current Every Day Smoker    Packs/day: 1.00    Types: Cigarettes  . Smokeless tobacco: Former Clinical biochemist  . Vaping Use: Unknown  Substance and Sexual Activity  . Alcohol use: Not Currently    Comment: haven't drank in months"  . 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 Sexual activity: Yes    Birth control/protection: None  Other Topics  Concern  . Not on file  Social History Narrative   Pt lives with parents and sister.  He is married, but currently separated.  Pt is on disability.   Social Determinants of Health   Financial Resource Strain: Not on file  Food Insecurity: Not on file  Transportation Needs: Not on file  Physical Activity: Not on file  Stress: Not on file  Social Connections: Not on file   SDOH:  SDOH Screenings   Alcohol Screen: Not on file  Depression (WPY0-9): Not on file  Financial Resource Strain: Not on file  Food Insecurity: Not on file  Housing: Not on file  Physical Activity:  Not on file  Social Connections: Not on file  Stress: Not on file  Tobacco Use: High Risk  . Smoking Tobacco Use: Current Every Day Smoker  . Smokeless Tobacco Use: Former Dispensing optician Needs: Not on file    Has this patient used any form of tobacco in the last 30 days? (Cigarettes, Smokeless Tobacco, Cigars, and/or Pipes) Prescription not provided because: n/a  Current Medications:  Current Facility-Administered Medications  Medication Dose Route Frequency Provider Last Rate Last Admin  . acetaminophen (TYLENOL) tablet 650 mg  650 mg Oral Q6H PRN Ajibola, Ene A, NP      . alum & mag hydroxide-simeth (MAALOX/MYLANTA) 200-200-20 MG/5ML suspension 30 mL  30 mL Oral Q4H PRN Ajibola, Ene A, NP      . benztropine (COGENTIN) tablet 1 mg  1 mg Oral BID Ajibola, Ene A, NP   1 mg at 11/17/20 9833  . hydrOXYzine (ATARAX/VISTARIL) tablet 25 mg  25 mg Oral TID PRN Ajibola, Ene A, NP      . magnesium hydroxide (MILK OF MAGNESIA) suspension 30 mL  30 mL Oral Daily PRN Ajibola, Ene A, NP      . risperiDONE (RISPERDAL) tablet 6 mg  6 mg Oral QHS Ajibola, Ene A, NP      . traZODone (DESYREL) tablet 100 mg  100 mg Oral QHS Ajibola, Ene A, NP       No current outpatient medications on file.    PTA Medications: (Not in a hospital admission)   Musculoskeletal  Strength & Muscle Tone: within normal limits Gait & Station: normal Patient leans: N/A  Psychiatric Specialty Exam  Presentation  General Appearance: Appropriate for Environment; Casual  Eye Contact:Minimal  Speech:Blocked  Speech Volume:Normal  Handedness:No data recorded  Mood and Affect  Mood:Irritable  Affect:Congruent   Thought Process  Thought Processes:Other (comment) (UTA due to minimal cooperation/participation)  Descriptions of Associations:-- (UTA due to limited cooperation/participation)  Orientation:Other (comment) (UTA due to limited participation/cooperation)  Thought Content:-- (UTA)  Diagnosis of  Schizophrenia or Schizoaffective disorder in past: Yes  Duration of Psychotic Symptoms: Greater than six months   Hallucinations:Hallucinations: Other (comment) (UTA)  Ideas of Reference:-- (UTA)  Suicidal Thoughts:Suicidal Thoughts: -- (UTA)  Homicidal Thoughts:Homicidal Thoughts: -- (UTA)   Sensorium  Memory:Other (comment) (UTA)  Judgment:Impaired  Insight:Lacking; Poor   Executive Functions  Concentration:Poor  Attention Span:Poor  Recall:Poor  Fund of Knowledge:Poor  Language:Poor   Psychomotor Activity  Psychomotor Activity:Psychomotor Activity: Normal   Assets  Assets:-- (UTA)   Sleep  Sleep:Sleep: -- (UTA)   No data recorded  Physical Exam  Physical Exam Constitutional:      Appearance: Normal appearance. He is normal weight.  HENT:     Head: Normocephalic and atraumatic.  Eyes:     Extraocular Movements: Extraocular movements intact.  Pulmonary:  Effort: Pulmonary effort is normal.  Neurological:     General: No focal deficit present.     Mental Status: He is alert and oriented to person, place, and time.  Psychiatric:        Attention and Perception: Attention and perception normal.        Speech: Speech normal.        Behavior: Behavior normal. Behavior is cooperative.        Thought Content: Thought content normal.    Review of Systems  Unable to perform ROS: Psychiatric disorder (patient declines to answer questions)   Blood pressure 112/82, pulse (!) 53, temperature 98.3 F (36.8 C), temperature source Oral, resp. rate 18, SpO2 99 %. There is no height or weight on file to calculate BMI.  Demographic Factors:  Male  Loss Factors: NA  Historical Factors: Impulsivity  Risk Reduction Factors:   Living with another person, especially a relative  Continued Clinical Symptoms:  Schizophrenia:   Less than 43 years old Paranoid or undifferentiated type Currently Psychotic  Cognitive Features That Contribute To Risk:   Loss of executive function    Suicide Risk:  Moderate:  Frequent suicidal ideation with limited intensity, and duration, some specificity in terms of plans, no associated intent, good self-control, limited dysphoria/symptomatology, some risk factors present, and identifiable protective factors, including available and accessible social support.  Plan Of Care/Follow-up recommendations:  Transfer to [facility]  Disposition: transfer to [facility] for further treatment.  Estella Husk, MD 11/18/2020, 4:35 PM

## 2020-11-18 NOTE — Progress Notes (Signed)
Patient is alert, continues to pace every now and then on the unit. Patient has needed to be redirected due to becoming to close to the beds of male patient. Patient continues to be preoccupied. Safety maintained. Staff will continue to monitor.

## 2020-11-18 NOTE — ED Provider Notes (Signed)
Behavioral Health Progress Note  Date and Time: 11/18/2020 3:41 PM Name: [name] [name] MRN:  [id number]  Subjective:   Patient seen and chart reviewed. He has refused scheduled risperdal. Patient is extremely guarded on interview and appears paranoid and looks as though he is RIS. He repeatedly asks to be discharged. Pt is largely uncooperative with assessment and states "It doesn't matter. Get me out of here" after every question asked.   Diagnosis:  Final diagnoses:  Paranoid schizophrenia (HCC)    Total Time spent with patient: 15 minutes  Past Psychiatric History: schizophrenia Past Medical History:  Past Medical History:  Diagnosis Date  . Anxiety   . Depression   . Mental disorder   . Schizophrenia (HCC)    No past surgical history on file. Family History:  Family History  Problem Relation Age of Onset  . Mental illness Neg Hx    Family Psychiatric  History: see H&P Social History:  Social History   Substance and Sexual Activity  Alcohol Use Not Currently   Comment: haven't drank in months"     Social History   Substance and Sexual Activity  Drug Use Yes  . 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 Marital status: Married    Spouse name: Not on file  . Number of children: 1  . Years of education: Not on file  . Highest education level: Not on file  Occupational History  . Occupation: Disabled  Tobacco Use  . Smoking status: Current Every Day Smoker    Packs/day: 1.00    Types: Cigarettes  . Smokeless tobacco: Former Clinical biochemist  . Vaping Use: Unknown  Substance and Sexual Activity  . Alcohol use: Not Currently    Comment: haven't drank in months"  . 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 Sexual activity: Yes    Birth control/protection: None  Other Topics Concern  . Not on file  Social History Narrative   Pt lives  with parents and sister.  He is married, but currently separated.  Pt is on disability.   Social Determinants of Health   Financial Resource Strain: Not on file  Food Insecurity: Not on file  Transportation Needs: Not on file  Physical Activity: Not on file  Stress: Not on file  Social Connections: Not on file   SDOH:  SDOH Screenings   Alcohol Screen: Not on file  Depression (NWG9-5): Not on file  Financial Resource Strain: Not on file  Food Insecurity: Not on file  Housing: Not on file  Physical Activity: Not on file  Social Connections: Not on file  Stress: Not on file  Tobacco Use: High Risk  . Smoking Tobacco Use: Current Every Day Smoker  . Smokeless Tobacco Use: Former Dispensing optician Needs: Not on file   Additional Social History:                         Sleep: UTA  Appetite:  UTA  Current Medications:  Current Facility-Administered Medications  Medication Dose Route Frequency Provider Last Rate Last Admin  . acetaminophen (TYLENOL) tablet 650 mg  650 mg Oral Q6H PRN Ajibola, Ene A, NP      . alum & mag hydroxide-simeth (MAALOX/MYLANTA) 200-200-20 MG/5ML suspension 30 mL  30 mL Oral Q4H PRN Ajibola, Ene A,  NP      . benztropine (COGENTIN) tablet 1 mg  1 mg Oral BID Ajibola, Ene A, NP   1 mg at 11/17/20 16100906  . hydrOXYzine (ATARAX/VISTARIL) tablet 25 mg  25 mg Oral TID PRN Ajibola, Ene A, NP      . magnesium hydroxide (MILK OF MAGNESIA) suspension 30 mL  30 mL Oral Daily PRN Ajibola, Ene A, NP      . risperiDONE (RISPERDAL) tablet 6 mg  6 mg Oral QHS Ajibola, Ene A, NP      . traZODone (DESYREL) tablet 100 mg  100 mg Oral QHS Ajibola, Ene A, NP       No current outpatient medications on file.    Labs  Lab Results:  Admission on 11/17/2020, Discharged on 11/17/2020  Component Date Value Ref Range Status  . Sodium 11/17/2020 141  135 - 145 mmol/L Final  . Potassium 11/17/2020 3.5  3.5 - 5.1 mmol/L Final  . Chloride 11/17/2020 110  98 - 111  mmol/L Final  . CO2 11/17/2020 23  22 - 32 mmol/L Final  . Glucose, Bld 11/17/2020 91  70 - 99 mg/dL Final   Glucose reference range applies only to samples taken after fasting for at least 8 hours.  . BUN 11/17/2020 18  6 - 20 mg/dL Final  . Creatinine, Ser 11/17/2020 0.91  0.61 - 1.24 mg/dL Final  . Calcium 96/04/540905/15/2022 9.5  8.9 - 10.3 mg/dL Final  . Total Protein 11/17/2020 8.5* 6.5 - 8.1 g/dL Final  . Albumin 81/19/147805/15/2022 5.1* 3.5 - 5.0 g/dL Final  . AST 29/56/213005/15/2022 20  15 - 41 U/L Final  . ALT 11/17/2020 19  0 - 44 U/L Final  . Alkaline Phosphatase 11/17/2020 64  38 - 126 U/L Final  . Total Bilirubin 11/17/2020 0.8  0.3 - 1.2 mg/dL Final  . GFR, Estimated 11/17/2020 >60  >60 mL/min Final   Comment: (NOTE) Calculated using the CKD-EPI Creatinine Equation (2021)   . Anion gap 11/17/2020 8  5 - 15 Final   Performed at [facility], [address] [place], [place] [address]  . Alcohol, Ethyl (B) 11/17/2020 <10  <10 mg/dL Final   Comment: (NOTE) Lowest detectable limit for serum alcohol is 10 mg/dL.  For medical purposes only. Performed at [facility], [address] [place], [place] [address]   . Salicylate Lvl 11/17/2020 <7.0* 7.0 - 30.0 mg/dL Final   Performed at [facility], [address] [place], [place] [address]  . Acetaminophen (Tylenol), Serum 11/17/2020 <10* 10 - 30 ug/mL Final   Comment: (NOTE) Therapeutic concentrations vary significantly. A range of 10-30 ug/mL  may be an effective concentration for many patients. However, some  are best treated at concentrations outside of this range. Acetaminophen concentrations >150 ug/mL at 4 hours after ingestion  and >50 ug/mL at 12 hours after ingestion are often associated with  toxic reactions.  Performed at [facility], [address] [place], [place] [address]   . WBC 11/17/2020 6.1  4.0 - 10.5 K/uL Final  . RBC 11/17/2020 5.38  4.22 - 5.81  MIL/uL Final  . Hemoglobin 11/17/2020 14.3  13.0 - 17.0 g/dL Final  . HCT 01/02/725305/15/2022 45.4  39.0 - 52.0 % Final  . MCV 11/17/2020 84.4  80.0 - 100.0 fL Final  . MCH 11/17/2020 26.6  26.0 - 34.0 pg Final  . MCHC 11/17/2020 31.5  30.0 - 36.0 g/dL Final  .  RDW 11/17/2020 13.1  11.5 - 15.5 % Final  . Platelets 11/17/2020 237  150 - 400 K/uL Final  . nRBC 11/17/2020 0.0  0.0 - 0.2 % Final   Performed at [facility], [address], [place], [place] [address]  . SARS Coronavirus 2 by RT PCR 11/17/2020 NEGATIVE  NEGATIVE Final   Comment: (NOTE) SARS-CoV-2 target nucleic acids are NOT DETECTED.  The SARS-CoV-2 RNA is generally detectable in upper respiratory specimens during the acute phase of infection. The lowest concentration of SARS-CoV-2 viral copies this assay can detect is 138 copies/mL. A negative result does not preclude SARS-Cov-2 infection and should not be used as the sole basis for treatment or other patient management decisions. A negative result may occur with  improper specimen collection/handling, submission of specimen other than nasopharyngeal swab, presence of viral mutation(s) within the areas targeted by this assay, and inadequate number of viral copies(<138 copies/mL). A negative result must be combined with clinical observations, patient history, and epidemiological information. The expected result is Negative.  Fact Sheet for Patients:  BloggerCourse.com  Fact Sheet for Healthcare Providers:  SeriousBroker.it  This test is no                          t yet approved or cleared by the [place] FDA and  has been authorized for detection and/or diagnosis of SARS-CoV-2 by FDA under an Emergency Use Authorization (EUA). This EUA will remain  in effect (meaning this test can be used) for the duration of the COVID-19 declaration under Section 564(b)(1) of the Act, 21 U.S.C.section 360bbb-3(b)(1),  unless the authorization is terminated  or revoked sooner.      . Influenza A by PCR 11/17/2020 NEGATIVE  NEGATIVE Final  . Influenza B by PCR 11/17/2020 NEGATIVE  NEGATIVE Final   Comment: (NOTE) The Xpert Xpress SARS-CoV-2/FLU/RSV plus assay is intended as an aid in the diagnosis of influenza from Nasopharyngeal swab specimens and should not be used as a sole basis for treatment. Nasal washings and aspirates are unacceptable for Xpert Xpress SARS-CoV-2/FLU/RSV testing.  Fact Sheet for Patients: BloggerCourse.com  Fact Sheet for Healthcare Providers: SeriousBroker.it  This test is not yet approved or cleared by the [place] FDA and has been authorized for detection and/or diagnosis of SARS-CoV-2 by FDA under an Emergency Use Authorization (EUA). This EUA will remain in effect (meaning this test can be used) for the duration of the COVID-19 declaration under Section 564(b)(1) of the Act, 21 U.S.C. section 360bbb-3(b)(1), unless the authorization is terminated or revoked.  Performed at [facility], [address] [place], [place] [address]     Blood Alcohol level:  Lab Results  Component Value Date   ETH <10 11/17/2020   ETH <10 02/28/2020    Metabolic Disorder Labs: Lab Results  Component Value Date   HGBA1C 5.3 10/30/2018   MPG 105.41 10/30/2018   MPG 116.89 12/19/2017   Lab Results  Component Value Date   PROLACTIN 45.8 (H) 12/19/2017   PROLACTIN 7.0 04/04/2015   Lab Results  Component Value Date   CHOL 139 10/30/2018   TRIG 77 10/30/2018   HDL 41 10/30/2018   CHOLHDL 3.4 10/30/2018   VLDL 15 10/30/2018   LDLCALC 83 10/30/2018   LDLCALC 111 (H) 12/19/2017    Therapeutic Lab Levels: No results found for: LITHIUM No results found for: VALPROATE No components found for:  CBMZ  Physical Findings  AIMS   Flowsheet Row Admission (Discharged) from OP Visit from 01/31/2019 in  [facility] Admission (Discharged) from OP Visit from 01/17/2019 in [facility] Admission (Discharged) [facility] Admission (Discharged) from OP Visit from 11/16/2018 in [facility] Admission (Discharged) from OP Visit from 10/30/2018 in [facility]  AIMS Total Score 0 0 0 0 0    AUDIT   Flowsheet Row Admission (Discharged) from OP Visit from 01/17/2019 in [facility] Admission (Discharged) from [facility]13/[facility] Admission (Discharged) from 07/29/2018 [facility] Admission (Discharged) from 07/18/2018 [facility] Admission (Discharged) from 12/17/2017 [facility]  Alcohol Use Disorder Identification Test Final Score (AUDIT) 0 0 0 0 0    Flowsheet Row ED from 11/17/2020 in [facility] Most recent reading at 11/17/2020  5:36 AM ED from 11/17/2020 in [facility] Most recent reading at 11/17/2020  2:06 AM ED from 02/28/2020 in [facility] EM[facility]PART[facility] Most recent reading at 02/28/2020  4:04 AM  C-SSRS RISK CATEGORY No Risk No Risk Error: Q6 is Yes, you must answer 7       Musculoskeletal  Strength & Muscle Tone: within normal limits Gait & Station: normal Patient leans: N/A  Psychiatric Specialty Exam  Presentation  General Appearance: Appropriate for Environment; Casual  Eye Contact:Minimal  Speech:Blocked  Speech Volume:Normal  Handedness:No data recorded  Mood and Affect  Mood:Irritable  Affect:Congruent   Thought Process  Thought Processes:Other (comment) (UTA due to minimal cooperation/participation)  Descriptions of Associations:-- (UTA due to limited  cooperation/participation)  Orientation:Other (comment) (UTA due to limited participation/cooperation)  Thought Content:-- (UTA)  Diagnosis of Schizophrenia or Schizoaffective disorder in past: Yes  Duration of Psychotic Symptoms: Greater than six months   Hallucinations:Hallucinations: Other (comment) (UTA)  Ideas of Reference:-- (UTA)  Suicidal Thoughts:Suicidal Thoughts: -- (UTA)  Homicidal Thoughts:Homicidal Thoughts: -- (UTA)   Sensorium  Memory:Other (comment) (UTA)  Judgment:Impaired  Insight:Lacking; Poor   Executive Functions  Concentration:Poor  Attention Span:Poor  Recall:Poor  Fund of Knowledge:Poor  Language:Poor   Psychomotor Activity  Psychomotor Activity:Psychomotor Activity: Normal   Assets  Assets:-- (UTA)   Sleep  Sleep:Sleep: -- (UTA)   No data recorded  Physical Exam  Physical Exam Constitutional:      Appearance: Normal appearance. He is normal weight.  HENT:     Head: Normocephalic and atraumatic.  Eyes:     Extraocular Movements: Extraocular movements intact.  Pulmonary:     Effort: Pulmonary effort is normal.  Neurological:     General: No focal deficit present.     Mental Status: He is alert and oriented to person, place, and time.  Psychiatric:        Attention and Perception: Attention and perception normal.        Speech: Speech normal.        Behavior: Behavior normal. Behavior is cooperative.        Thought Content: Thought content normal.    Review of Systems  Unable to perform ROS: Psychiatric disorder (patient declines to answer questions)   Blood pressure 112/82, pulse (!) 53, temperature 98.3 F (36.8 C), temperature source Oral, resp. rate 18, SpO2 99 %. There is no height or weight on file to calculate BMI.  Treatment Plan Summary: Daily contact with  patient to assess and evaluate symptoms and progress in treatment and Medication management   Schizophrenia -continue risperdal 6 mg qhs and cogentin 1  mg daily for now -may need to do forced med protocol if he continues to refuse -pt currently under IVC -will order EKG; most recent one from 01/2019 -meets criteria for inpatient admission as acutely psychotic; seeking placement- pt has been at the [facility] hours. If no availability at [facility] would be appropriate to fax out  Estella Husk, MD 11/18/2020 3:41 PM

## 2020-11-18 NOTE — ED Notes (Signed)
Pt asleep in bed. Respirations even and unlabored. Will continue to monitor for safety. ?

## 2020-11-18 NOTE — ED Notes (Addendum)
Received message from [place], RN/[facility] stating pt can be transferred around midnight.

## 2020-11-18 NOTE — ED Notes (Signed)
[facility] arrived to transport pt to [facility]. Pt A&O x4, ambulatory. No signs of acute distress noted. Belongings from locker given to [facility] to transport with pt to [facility]. Pt escorted to [facility] by staff without issue. Safety maintained.

## 2020-11-18 NOTE — ED Notes (Signed)
Pt sleeping at present, no distress noted.  Monitoring for safety. 

## 2020-11-18 NOTE — ED Notes (Signed)
Patient given 2 cranberry juice. Patient was offered breakfast items but did not consent to any.

## 2020-11-18 NOTE — ED Notes (Signed)
[employer] called for transport 

## 2020-11-18 NOTE — Progress Notes (Signed)
Patient received ziti for dinner.

## 2020-11-18 NOTE — ED Notes (Signed)
Attempted to call report to [facility]. Spoke with Glendon Axe, charge RN who states she will call this RN back regarding time pt can be transferred.

## 2020-11-18 NOTE — BH Assessment (Signed)
Disposition: Received an update from the [facility], RN), patient is accepted to [phone] at [phone]. Attending is Jola Babinski MD. Report may be called to [phone]. Patient's nurse provided updates via secure chat.

## 2020-11-18 NOTE — Discharge Instructions (Signed)
Transfer to [facility] °

## 2020-11-18 NOTE — Progress Notes (Signed)
Pt under review at [facility].  Penni Homans, MSW, LCSW 11/18/2020 9:49 AM

## 2020-11-18 NOTE — Progress Notes (Signed)
Patient refused scheduled medication this moring. Patient sitting up on the side of the bed staring at hands. Patient seems to be preoccupied and irritable this morning. Patient unwilling to answer questions at this time. Nursing staff will continue to monitor.

## 2020-11-18 NOTE — ED Notes (Signed)
Resting with eyes closed. Rise and fall of chest noted. No new issues at this time. Will continue to monitor for safety 

## 2020-11-18 NOTE — ED Notes (Addendum)
Report given to [place], [facility] RN, states pt may come to [facility] now

## 2020-11-19 ENCOUNTER — Encounter (HOSPITAL_COMMUNITY): Payer: Self-pay | Admitting: Psychiatry

## 2020-11-19 ENCOUNTER — Inpatient Hospital Stay (HOSPITAL_COMMUNITY)
Admission: AD | Admit: 2020-11-19 | Discharge: 2020-11-25 | DRG: 885 | Disposition: A | Discharge status: Home / Self Care | Payer: Medicaid Other | Attending: Psychiatry | Admitting: Psychiatry

## 2020-11-19 DIAGNOSIS — F209 Schizophrenia, unspecified: Secondary | ICD-10-CM | POA: Diagnosis present

## 2020-11-19 DIAGNOSIS — F17210 Nicotine dependence, cigarettes, uncomplicated: Secondary | ICD-10-CM | POA: Diagnosis present

## 2020-11-19 DIAGNOSIS — F200 Paranoid schizophrenia: Secondary | ICD-10-CM | POA: Diagnosis present

## 2020-11-19 DIAGNOSIS — Z79899 Other long term (current) drug therapy: Secondary | ICD-10-CM

## 2020-11-19 DIAGNOSIS — Z9114 Patient's other noncompliance with medication regimen: Secondary | ICD-10-CM

## 2020-11-19 DIAGNOSIS — G4700 Insomnia, unspecified: Secondary | ICD-10-CM | POA: Diagnosis present

## 2020-11-19 DIAGNOSIS — F419 Anxiety disorder, unspecified: Secondary | ICD-10-CM | POA: Diagnosis present

## 2020-11-19 MED ORDER — ZIPRASIDONE MESYLATE 20 MG IM SOLR: 20.0000 mg | INTRAMUSCULAR | Status: DC | PRN

## 2020-11-19 MED ORDER — BENZTROPINE MESYLATE 1 MG PO TABS
1.0000 mg | ORAL_TABLET | Freq: Two times a day (BID) | ORAL | Status: DC
  Administered 2020-11-20 – 2020-11-25 (×11): 1 mg via ORAL
  Filled 2020-11-19 (×2): qty 1
  Filled 2020-11-19 (×2): qty 14
  Filled 2020-11-19 (×3): qty 1
  Filled 2020-11-19: qty 14
  Filled 2020-11-19 (×7): qty 1
  Filled 2020-11-19: qty 14
  Filled 2020-11-19: qty 1

## 2020-11-19 MED ORDER — ALUM & MAG HYDROXIDE-SIMETH 200-200-20 MG/5ML PO SUSP: 30.0000 mL | ORAL | Status: DC | PRN

## 2020-11-19 MED ORDER — MAGNESIUM HYDROXIDE 400 MG/5ML PO SUSP: 30.0000 mL | Freq: Every day | ORAL | Status: DC | PRN

## 2020-11-19 MED ORDER — HALOPERIDOL 5 MG PO TABS: 10.0000 mg | ORAL_TABLET | Freq: Two times a day (BID) | ORAL | Status: DC | PRN

## 2020-11-19 MED ORDER — RISPERIDONE 3 MG PO TABS
6.0000 mg | ORAL_TABLET | Freq: Every day | ORAL | Status: DC
  Administered 2020-11-19: 6 mg via ORAL
  Filled 2020-11-19 (×3): qty 2

## 2020-11-19 MED ORDER — OLANZAPINE 10 MG PO TBDP: 10.0000 mg | ORAL_TABLET | Freq: Three times a day (TID) | ORAL | Status: DC | PRN

## 2020-11-19 MED ORDER — LORAZEPAM 1 MG PO TABS: 1.0000 mg | ORAL_TABLET | ORAL | Status: DC | PRN

## 2020-11-19 MED ORDER — HYDROXYZINE HCL 25 MG PO TABS
25.0000 mg | ORAL_TABLET | Freq: Three times a day (TID) | ORAL | Status: DC | PRN
  Administered 2020-11-23 – 2020-11-24 (×2): 25 mg via ORAL
  Filled 2020-11-19: qty 1
  Filled 2020-11-19: qty 10
  Filled 2020-11-19: qty 1

## 2020-11-19 MED ORDER — RISPERIDONE 2 MG PO TBDP
2.0000 mg | ORAL_TABLET | Freq: Every day | ORAL | Status: DC
  Administered 2020-11-20 – 2020-11-25 (×6): 2 mg via ORAL
  Filled 2020-11-19 (×2): qty 1
  Filled 2020-11-19: qty 7
  Filled 2020-11-19 (×3): qty 1
  Filled 2020-11-19: qty 7
  Filled 2020-11-19: qty 1

## 2020-11-19 MED ORDER — NICOTINE 21 MG/24HR TD PT24
21.0000 mg | MEDICATED_PATCH | Freq: Every day | TRANSDERMAL | Status: DC
  Filled 2020-11-19 (×9): qty 1

## 2020-11-19 MED ORDER — HALOPERIDOL LACTATE 5 MG/ML IJ SOLN: 10.0000 mg | Freq: Two times a day (BID) | INTRAMUSCULAR | Status: DC | PRN

## 2020-11-19 MED ORDER — ACETAMINOPHEN 325 MG PO TABS: 650.0000 mg | ORAL_TABLET | Freq: Four times a day (QID) | ORAL | Status: DC | PRN

## 2020-11-19 MED ORDER — TRAZODONE HCL 100 MG PO TABS
100.0000 mg | ORAL_TABLET | Freq: Every day | ORAL | Status: DC
  Administered 2020-11-19 – 2020-11-23 (×5): 100 mg via ORAL
  Filled 2020-11-19 (×7): qty 1
  Filled 2020-11-19: qty 7

## 2020-11-19 NOTE — Progress Notes (Signed)
Recreation Therapy Notes  Date: 5.17.22 Time: 0950 Location: 500 Hall Dayroom  Group Topic: Wellness  Goal Area(s) Addresses:  Patient will define components of whole wellness. Patient will verbalize benefit of whole wellness.  Intervention: Music  Activity:  Exercise.  LRT led patients in a series of stretches to loosen the muscles.  Patients then took turns leading group in exercises of their choosing.  Patients were to get a least 30 minutes of exercise.  Patients could take breaks or get water as needed.  During processing, LRT went over the elements (mental, physical and spiritual) of wellness with the patients and how they work together.  Education: Wellness, Building control surveyor.   Education Outcome: Acknowledges education/In group clarification offered/Needs additional education.   Clinical Observations/Feedback: Pt did not attend group session.     Caroll Rancher, LRT/CTRS         Caroll Rancher A 11/19/2020 11:32 AM
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
## 2020-11-19 NOTE — Progress Notes (Signed)
The patient attended group but refused to share.  

## 2020-11-19 NOTE — H&P (Signed)
Psychiatric Admission Assessment Adult  Patient Identification: [name] [name] MRN:  [id number] Date of Evaluation:  11/19/2020 Chief Complaint:  Schizophrenia (HCC) [F20.9] Principal Diagnosis: <principal problem not specified> Diagnosis:  Active Problems:   Schizophrenia (HCC)  History of Present Illness: Patient is seen and examined.  Patient is a 40 year old male with a reported past psychiatric history significant for schizophrenia versus schizoaffective disorder as well as psychoactive substance induced psychosis who presented to the [facility] emergency department on 11/17/2020 after he threatened his mother and exhibited aggressive behavior towards her.  She also reported that he sees things that are not there and refuses to take medications because "people are trying to kill him".  On examination today the patient is thought blocking.  He stares out a window and does not speak.  He did come into the office and is able to understand what I am saying.  While in the emergency department he was assessed by the comprehensive clinical assessment team.  He had been petition for involuntary commitment by his mother as per above.  It was noted that he did not respond any questions during that assessment.  He has a history of using alcohol, marijuana, cocaine and methamphetamines.  He also has a history of medication noncompliance.  Per the electronic medical record he has threatened family members in the past.  He has been psychiatrically hospitalized several times, and most recently at [facility] in [place], [place] in August 2021.  His most recent psychiatric hospitalization in our facility was in 01/31/2019.  His medications at that time included Cogentin 1 mg p.o. twice daily, Risperdal 6 mg p.o. nightly as well as trazodone 100 mg p.o. nightly.  He was transferred to the behavioral health urgent care center.  He did speak to the psychiatric team at the behavioral health  urgent care center on 11/18/2020.  It was noted that he had refused Risperdal.  He was noted to be extremely guarded.  It was noted that he appeared to be responding to internal stimuli.  It was noted that he was largely uncooperative with assessment and stated "it does not matter".  He was transferred to our facility on 11/19/2020.  Unfortunately in care everywhere we do not have any information from any of the previous psychiatric hospitalizations.  Associated Signs/Symptoms: Depression Symptoms:  insomnia, disturbed sleep, Duration of Depression Symptoms: Greater than two weeks  (Hypo) Manic Symptoms:  Delusions, Distractibility, Hallucinations, Impulsivity, Irritable Mood, Labiality of Mood, Anxiety Symptoms:  Excessive Worry, Psychotic Symptoms:  Delusions, Hallucinations: Auditory Paranoia, PTSD Symptoms: Negative Total Time spent with patient: 30 minutes  Past Psychiatric History: Patient apparently has had a recent psychiatric hospitalization at [facility], and this was in [facility] in August 2021.  He is also been at our facility in the past.  His last admission here was on 01/31/2019.  He has a history of schizophrenia versus schizoaffective disorder.  Is the patient at risk to self? Yes.    Has the patient been a risk to self in the past 6 months? No.  Has the patient been a risk to self within the distant past? No.  Is the patient a risk to others? Yes.    Has the patient been a risk to others in the past 6 months? No.  Has the patient been a risk to others within the distant past? No.   Prior Inpatient Therapy:   Prior Outpatient Therapy:    Alcohol Screening:   Substance Abuse  History in the last 12 months:  Yes.   Consequences of Substance Abuse: Negative Previous Psychotropic Medications: Yes  Psychological Evaluations: Yes  Past Medical History:  Past Medical History:  Diagnosis Date  . Anxiety   . Depression   . Mental disorder   .  Schizophrenia (HCC)    History reviewed. No pertinent surgical history. Family History:  Family History  Problem Relation Age of Onset  . Mental illness Neg Hx    Family Psychiatric  History: Unknown Tobacco Screening: Have you used any form of tobacco in the last 30 days? (Cigarettes, Smokeless Tobacco, Cigars, and/or Pipes): Yes Tobacco use, Select all that apply: 5 or more cigarettes per day Are you interested in Tobacco Cessation Medications?: Yes, will notify MD for an order Counseled patient on smoking cessation including recognizing danger situations, developing coping skills and basic information about quitting provided: Refused/Declined practical counseling Social History:  Social History   Substance and Sexual Activity  Alcohol Use Not Currently   Comment: haven't drank in months"     Social History   Substance and Sexual Activity  Drug Use Yes  . Types: Marijuana, Methamphetamines   Comment: Per sister, Pt uses meth    Additional Social History:                           Allergies:  No Known Allergies Lab Results: No results found for this or any previous visit (from the past 48 hour(s)).  Blood Alcohol level:  Lab Results  Component Value Date   ETH <10 11/17/2020   ETH <10 02/28/2020    Metabolic Disorder Labs:  Lab Results  Component Value Date   HGBA1C 5.3 10/30/2018   MPG 105.41 10/30/2018   MPG 116.89 12/19/2017   Lab Results  Component Value Date   PROLACTIN 45.8 (H) 12/19/2017   PROLACTIN 7.0 04/04/2015   Lab Results  Component Value Date   CHOL 139 10/30/2018   TRIG 77 10/30/2018   HDL 41 10/30/2018   CHOLHDL 3.4 10/30/2018   VLDL 15 10/30/2018   LDLCALC 83 10/30/2018   LDLCALC 111 (H) 12/19/2017    Current Medications: Current Facility-Administered Medications  Medication Dose Route Frequency Provider Last Rate Last Admin  . acetaminophen (TYLENOL) tablet 650 mg  650 mg Oral Q6H PRN Estella HuskLaubach, Katherine S, MD      . alum  & mag hydroxide-simeth (MAALOX/MYLANTA) 200-200-20 MG/5ML suspension 30 mL  30 mL Oral Q4H PRN Estella HuskLaubach, Katherine S, MD      . benztropine (COGENTIN) tablet 1 mg  1 mg Oral BID Estella HuskLaubach, Katherine S, MD      . hydrOXYzine (ATARAX/VISTARIL) tablet 25 mg  25 mg Oral TID PRN Estella HuskLaubach, Katherine S, MD      . OLANZapine zydis (ZYPREXA) disintegrating tablet 10 mg  10 mg Oral Q8H PRN Estella HuskLaubach, Katherine S, MD       And  . LORazepam (ATIVAN) tablet 1 mg  1 mg Oral PRN Estella HuskLaubach, Katherine S, MD       And  . ziprasidone (GEODON) injection 20 mg  20 mg Intramuscular PRN Estella HuskLaubach, Katherine S, MD      . magnesium hydroxide (MILK OF MAGNESIA) suspension 30 mL  30 mL Oral Daily PRN Estella HuskLaubach, Katherine S, MD      . nicotine (NICODERM CQ - dosed in mg/24 hours) patch 21 mg  21 mg Transdermal Daily [place] Abtsaylor, Cody W, PA-C      . risperiDONE (  RISPERDAL) tablet 6 mg  6 mg Oral QHS Estella Husk, MD      . traZODone (DESYREL) tablet 100 mg  100 mg Oral QHS Estella Husk, MD       PTA Medications: Medications Prior to Admission  Medication Sig Dispense Refill Last Dose  . benztropine (COGENTIN) 1 MG tablet Take 1 tablet (1 mg total) by mouth 2 (two) times daily.     . risperiDONE (RISPERDAL) 3 MG tablet Take 2 tablets (6 mg total) by mouth at bedtime.     . traZODone (DESYREL) 100 MG tablet Take 1 tablet (100 mg total) by mouth at bedtime.       Musculoskeletal: Strength & Muscle Tone: within normal limits Gait & Station: normal Patient leans: N/A            Psychiatric Specialty Exam:  Presentation  General Appearance: Casual  Eye Contact:Minimal  Speech:Blocked  Speech Volume:Other (comment)  Handedness:Right   Mood and Affect  Mood:Dysphoric  Affect:Flat   Thought Process  Thought Processes:Other (comment)  Duration of Psychotic Symptoms: Greater than six months  Past Diagnosis of Schizophrenia or Psychoactive disorder: Yes  Descriptions of  Associations:Loose  Orientation:None  Thought Content:Delusions  Hallucinations:Hallucinations: Auditory  Ideas of Reference:Delusions; Paranoia  Suicidal Thoughts:Suicidal Thoughts: No  Homicidal Thoughts:Homicidal Thoughts: No   Sensorium  Memory:Immediate Poor; Recent Poor; Remote Poor  Judgment:Impaired  Insight:Lacking   Executive Functions  Concentration:Poor  Attention Span:Poor  Recall:Poor  Fund of Knowledge:Poor  Language:Poor   Psychomotor Activity  Psychomotor Activity:Psychomotor Activity: Decreased   Assets  Assets:Desire for Improvement; Resilience   Sleep  Sleep:Sleep: Poor    Physical Exam: Physical Exam Vitals and nursing note reviewed.  Constitutional:      Appearance: Normal appearance.  HENT:     Head: Normocephalic and atraumatic.  Pulmonary:     Effort: Pulmonary effort is normal.  Neurological:     General: No focal deficit present.     Mental Status: He is alert.    ROS Blood pressure 115/72, pulse 64, temperature 98 F (36.7 C), temperature source Oral, height 5\' 3"  (1.6 m), weight 63.5 kg. Body mass index is 24.8 kg/m.  Treatment Plan Summary: Daily contact with patient to assess and evaluate symptoms and progress in treatment, Medication management and Plan : Patient is seen and examined.  Patient is a 40 year old male with the above-stated past psychiatric history who was admitted secondary to worsening paranoia and psychosis as well as noncompliance with medications.  He will be admitted to the hospital.  He will be integrated in the milieu.  He will be encouraged to attend groups.  On admission he was restarted on Cogentin 1 mg p.o. twice daily, hydroxyzine as needed, the Zyprexa Zydis agitation protocol as well as Risperdal 6 mg p.o. nightly.  I suspect that he refused the Risperdal last night while he was at the behavioral health urgent care center.  We will most likely have to do forced medications on him.  I will  asked Dr. 20 to evaluate him for a second opinion on forced medications.  Otherwise, he will be admitted to the unit.  He will be integrated in the milieu.  He will be encouraged to attend groups.  We will continue the medications as is, and most likely he will require a long-acting injectable medication.  We will also get collateral information from his father.  Review of his admission laboratories showed normal electrolytes including creatinine at 0.91 and liver function  enzymes.  CBC was completely normal.  Acetaminophen was less than 10, salicylate less than 7.  His respiratory panel was negative for influenza A, B and coronavirus.  Blood alcohol was less than 10, salicylate less than 7.  Drug screen was not done.  We will attempt to collect one here.  EKG showed a sinus bradycardia with a QTc interval of 427.  His vital signs are stable, he is afebrile.  Observation Level/Precautions:  15 minute checks  Laboratory:  Chemistry Profile  Psychotherapy:    Medications:    Consultations:    Discharge Concerns:    Estimated LOS:  Other:     Physician Treatment Plan for Primary Diagnosis: <principal problem not specified> Long Term Goal(s): Improvement in symptoms so as ready for discharge  Short Term Goals: Ability to identify changes in lifestyle to reduce recurrence of condition will improve, Ability to verbalize feelings will improve, Ability to demonstrate self-control will improve, Ability to identify and develop effective coping behaviors will improve, Ability to maintain clinical measurements within normal limits will improve, Compliance with prescribed medications will improve and Ability to identify triggers associated with substance abuse/mental health issues will improve  Physician Treatment Plan for Secondary Diagnosis: Active Problems:   Schizophrenia (HCC)  Long Term Goal(s): Improvement in symptoms so as ready for discharge  Short Term Goals: Ability to identify changes  in lifestyle to reduce recurrence of condition will improve, Ability to verbalize feelings will improve, Ability to demonstrate self-control will improve, Ability to identify and develop effective coping behaviors will improve, Ability to maintain clinical measurements within normal limits will improve, Compliance with prescribed medications will improve and Ability to identify triggers associated with substance abuse/mental health issues will improve  I certify that inpatient services furnished can reasonably be expected to improve the patient's condition.    Antonieta Pert, MD 5/17/202212:19 PM

## 2020-11-19 NOTE — Tx Team (Signed)
Initial Treatment Plan 11/19/2020 2:49 AM [name] [name] GGY:[id number]    PATIENT STRESSORS: Other: being here at [facility]   PATIENT STRENGTHS: Capable of independent living Physical Health Supportive family/friends   PATIENT IDENTIFIED PROBLEMS: Psychosis  Aggressive behavior towards mother  Medication non-compliance  (pt does not want to be here; wants to go home)               DISCHARGE CRITERIA:  Improved stabilization in mood, thinking, and/or behavior Motivation to continue treatment in a less acute level of care Verbal commitment to aftercare and medication compliance  PRELIMINARY DISCHARGE PLAN: Attend aftercare/continuing care group Attend PHP/IOP Return to previous living arrangement  PATIENT/FAMILY INVOLVEMENT: This treatment plan has been presented to and reviewed with the patient, [name] [name], and/or family member.  The patient and family have been given the opportunity to ask questions and make suggestions.  Victorino December, RN 11/19/2020, 2:49 AM

## 2020-11-19 NOTE — BHH Counselor (Signed)
CSW attempted to complete patients PSA. Patient was observed to be pacing in his room however, would not acknowledge CSW.     Ruthann Cancer MSW, LCSW Clincal Social Worker  [facility]

## 2020-11-19 NOTE — BHH Counselor (Signed)
CSW attempted to complete PSA with this patient. Patient declined to to participate in assessment and declined to take towel off of face.   CSW will attempt to complete PSA at a later time.     Ruthann Cancer MSW, LCSW Clincal Social Worker  [facility]

## 2020-11-19 NOTE — Progress Notes (Signed)
[facility] Second Physician Opinion Progress Note for Medication Administration to Non-consenting Patients (For Involuntarily Committed Patients)  Patient: [name] [name] Date of Birth: [birth year] MRN: [id number]   I was asked to see the patient by his attending psychiatrist Dr. Mallie Darting for second opinion regarding medication administration to a non-consenting patient.  Medical record reviewed.  Patient's case discussed with staff and treating physician.  I met with and evaluated the patient this afternoon on the unit. Arye Weyenberg is a 40 year old male with schizophrenia versus schizoaffective disorder and history of comorbid substance use disorder admitted on IVC commitment after he threatened his mother and exhibited aggressive behavior toward her.  Reportedly, he was not taking his medications prior to admission.  Since presenting to the [facility] the patient has appeared paranoid, irritable, internally preoccupied and responding to internal stimuli at times, intermittently agitated/pacing, disheveled and selectively mute or poorly engaged in conversation with/assessments by physicians and other staff.  Per chart review, the patient reportedly declined to take p.o. risperidone while at the behavioral health urgent care center.  The patient was reclining in bed and pulled a sheet over his face when I knocked and entered the room.  He declined to speak with me at all and remained electively mute despite repeated attempts to engage him in conversation or in discussion of medications.  The patient is currently too symptomatic to participate meaningfully in conversations and decisions about medication and other treatment.  Reason for the Medication: The patient, without the benefit of the specific treatment measure, is incapable of participating in any available treatment plan that will give the patient a realistic opportunity of improving the patient's condition.  There is, without the benefit of  the specific treatment measure, a significant possibility that the patient will harm self or others before improvement of the patient's condition is realized.  Consideration of Side Effects: Consideration of the side effects related to the medication plan has been given.  Rationale for Medication Administration: Without treatment with antipsychotics, patient is likely to remain electively mute, significantly symptomatic and unable to participate in any treatment plan that will give his psychiatric symptoms a realistic opportunity of improving.  Given the current severity of his psychiatric symptoms and the threats to his mother and aggressive behavior towards his mother prior to admission, without treatment with antipsychotics, there is a significant possibility that patient will harm others or himself before improvement of his condition is realized.    Arthor Captain, MD 11/19/20  1:02 PM   This documentation is good for (7) seven days from the date of the MD signature. New documentation must be completed every seven (7) days with detailed justification in the medical record if the patient requires continued non-emergent administration of psychotropic medications.

## 2020-11-19 NOTE — Progress Notes (Signed)
   11/19/20 2120  Psych Admission Type (Psych Patients Only)  Admission Status Involuntary  Psychosocial Assessment  Patient Complaints Other (Comment)  Eye Contact Brief  Facial Expression Flat  Affect Flat  Speech Other (Comment) (not speaking)  Interaction Minimal  Appearance/Hygiene Unremarkable  Behavior Characteristics Cooperative  Mood Other (Comment) (patient non-verbal)  Thought Process  Coherency WDL  Content WDL  Delusions WDL  Perception WDL  Hallucination None reported or observed  Judgment WDL  Confusion WDL  Danger to Self  Current suicidal ideation? Denies  Danger to Others  Danger to Others None reported or observed  Patient not wanting to answer any questions tonight. Will speak at times when he wants something. Did take medication tonight without much effort

## 2020-11-19 NOTE — Progress Notes (Signed)
Patient ID: [name] [name], male   DOB: August 04, 1980, 40 y.o.   MRN: [id number] D: Pt here IVC from [facility]. Pt denies SI, HI, AVH and pain. Pt IVC'd by his mother for aggressive and threatening behavior towards her. Pt off his psychiatric meds, neglecting his hygiene and talking to himself. Upon arrival, pt taken to search room but refused to answer questions. Pt also refused to sign admission paperwork. "I want a cigarette." Pt told that this is a smoke free campus and that a nicotine patch can be ordered for him if he chooses. "I want to go home. I'm not signing any papers." Pt sat in chair. Did allow MHT to assess vital signs. Per report from [facility], pt refused all medications while there.   Per Assessment Note: Pt is a 40 year old single male who presents unaccompanied to [facility] via Patent examiner after being petitioned for involuntary commitment by his mother, [name] [name] [phone]. Affidavit and petition states: "[name] has been diagnosed with schizophrenia and is not medication compliant. He threatened his mother tonight and exhibited aggressive behavior toward her. He see things that aren't there and refuses to take medication because people are trying to kill him."  Pt would not respond to any questions during assessment. He sat with his arms crossed and avoided eye contact. Pt's medical record indicates Pt has a diagnosis of schizophrenia and has been petitioned for IVC several times in the past. In previous presentations he has avoided eye contact, refused to answer questions, stated he sees ghosts, and stated that people are trying to kill him. Pt has a history of using alcohol, marijuana, cocaine, and methamphetamines. Pt has a history of medication noncompliance. Per medical record, he has threatened family members in the past. Pt has been psychiatrically hospitalized multiple times, most recently at [facility] in August 2021. He has also been psychiatrically hospitalized at [facility]  and [facility].  A: Pt was offered support and encouragement. Pt is uncooperative at this time. Belongings searched and contraband items placed in locker. Non-invasive skin search completed: pt has tattoo on his left forearm. Pt offered food and drink and both accepted. Pt introduced to unit milieu by nursing staff. Q 15 minute checks were started for safety.   R: Pt in room eating. Pt safety maintained on unit.

## 2020-11-19 NOTE — BHH Suicide Risk Assessment (Signed)
[facility] Admission Suicide Risk Assessment   Nursing information obtained from:  Patient Demographic factors:  Male,Unemployed,Low socioeconomic status Current Mental Status:  NA Loss Factors:  NA Historical Factors:  NA (pt refusing to answer) Risk Reduction Factors:  Living with another person, especially a relative,Positive social support  Total Time spent with patient: 30 minutes Principal Problem: <principal problem not specified> Diagnosis:  Active Problems:   Schizophrenia (HCC)  Subjective Data: Patient is seen and examined.  Patient is a 39 year old male with a reported past psychiatric history significant for schizophrenia versus schizoaffective disorder as well as psychoactive substance induced psychosis who presented to the [facility] emergency department on 11/17/2020 after he threatened his mother and exhibited aggressive behavior towards her.  She also reported that he sees things that are not there and refuses to take medications because "people are trying to kill him".  On examination today the patient is thought blocking.  He stares out a window and does not speak.  He did come into the office and is able to understand what I am saying.  While in the emergency department he was assessed by the comprehensive clinical assessment team.  He had been petition for involuntary commitment by his mother as per above.  It was noted that he did not respond any questions during that assessment.  He has a history of using alcohol, marijuana, cocaine and methamphetamines.  He also has a history of medication noncompliance.  Per the electronic medical record he has threatened family members in the past.  He has been psychiatrically hospitalized several times, and most recently at [facility] in [place], [place] in August 2021.  His most recent psychiatric hospitalization in our facility was in 01/31/2019.  His medications at that time included Cogentin 1 mg p.o. twice  daily, Risperdal 6 mg p.o. nightly as well as trazodone 100 mg p.o. nightly.  He was transferred to the behavioral health urgent care center.  He did speak to the psychiatric team at the behavioral health urgent care center on 11/18/2020.  It was noted that he had refused Risperdal.  He was noted to be extremely guarded.  It was noted that he appeared to be responding to internal stimuli.  It was noted that he was largely uncooperative with assessment and stated "it does not matter".  He was transferred to our facility on 11/19/2020.  Unfortunately in care everywhere we do not have any information from any of the previous psychiatric hospitalizations.  Continued Clinical Symptoms:    The "Alcohol Use Disorders Identification Test", Guidelines for Use in Primary Care, Second Edition.  World Science writer [facility]). Score between 0-7:  no or low risk or alcohol related problems. Score between 8-15:  moderate risk of alcohol related problems. Score between 16-19:  high risk of alcohol related problems. Score 20 or above:  warrants further diagnostic evaluation for alcohol dependence and treatment.   CLINICAL FACTORS:   Schizophrenia:   Less than 40 years old Paranoid or undifferentiated type   Musculoskeletal: Strength & Muscle Tone: within normal limits Gait & Station: normal Patient leans: N/A  Psychiatric Specialty Exam:  Presentation  General Appearance: Casual  Eye Contact:Minimal  Speech:Blocked  Speech Volume:Other (comment)  Handedness:Right   Mood and Affect  Mood:Dysphoric  Affect:Flat   Thought Process  Thought Processes:Other (comment)  Descriptions of Associations:Loose  Orientation:None  Thought Content:Delusions  History of Schizophrenia/Schizoaffective disorder:Yes  Duration of Psychotic Symptoms:Greater than six months  Hallucinations:Hallucinations: Auditory  Ideas of Reference:Delusions;  Paranoia  Suicidal Thoughts:Suicidal Thoughts:  No  Homicidal Thoughts:Homicidal Thoughts: No   Sensorium  Memory:Immediate Poor; Recent Poor; Remote Poor  Judgment:Impaired  Insight:Lacking   Executive Functions  Concentration:Poor  Attention Span:Poor  Recall:Poor  Fund of Knowledge:Poor  Language:Poor   Psychomotor Activity  Psychomotor Activity:Psychomotor Activity: Decreased   Assets  Assets:Desire for Improvement; Resilience   Sleep  Sleep:Sleep: Poor    Physical Exam: Physical Exam Vitals and nursing note reviewed.  Constitutional:      Appearance: Normal appearance.  HENT:     Head: Normocephalic and atraumatic.  Pulmonary:     Effort: Pulmonary effort is normal.  Neurological:     General: No focal deficit present.     Mental Status: He is alert.    ROS Blood pressure 115/72, pulse 64, temperature 98 F (36.7 C), temperature source Oral, height 5\' 3"  (1.6 m), weight 63.5 kg. Body mass index is 24.8 kg/m.   COGNITIVE FEATURES THAT CONTRIBUTE TO RISK:  Thought constriction (tunnel vision)    SUICIDE RISK:   Moderate:  Frequent suicidal ideation with limited intensity, and duration, some specificity in terms of plans, no associated intent, good self-control, limited dysphoria/symptomatology, some risk factors present, and identifiable protective factors, including available and accessible social support.  PLAN OF CARE: Patient is seen and examined.  Patient is a 40 year old male with the above-stated past psychiatric history who was admitted secondary to worsening paranoia and psychosis as well as noncompliance with medications.  He will be admitted to the hospital.  He will be integrated in the milieu.  He will be encouraged to attend groups.  On admission he was restarted on Cogentin 1 mg p.o. twice daily, hydroxyzine as needed, the Zyprexa Zydis agitation protocol as well as Risperdal 6 mg p.o. nightly.  I suspect that he refused the Risperdal last night while he was at the behavioral health  urgent care center.  We will most likely have to do forced medications on him.  I will asked Dr. 20 to evaluate him for a second opinion on forced medications.  Otherwise, he will be admitted to the unit.  He will be integrated in the milieu.  He will be encouraged to attend groups.  We will continue the medications as is, and most likely he will require a long-acting injectable medication.  We will also get collateral information from his father.  Review of his admission laboratories showed normal electrolytes including creatinine at 0.91 and liver function enzymes.  CBC was completely normal.  Acetaminophen was less than 10, salicylate less than 7.  His respiratory panel was negative for influenza A, B and coronavirus.  Blood alcohol was less than 10, salicylate less than 7.  Drug screen was not done.  We will attempt to collect one here.  EKG showed a sinus bradycardia with a QTc interval of 427.  His vital signs are stable, he is afebrile.  I certify that inpatient services furnished can reasonably be expected to improve the patient's condition.   Clifton Custard, MD 11/19/2020, 10:13 AM

## 2020-11-19 NOTE — Progress Notes (Signed)
Recreation Therapy Notes  5.17.22 1335:  LRT went to patient room to complete recreation therapy assessment.  Patient was walking around in his room.  When asked to complete assessment, patient would not answer any questions asked of him.  LRT will attempt to complete assessment at a later time.    Caroll Rancher, LRT/CTRS    Caroll Rancher A 11/19/2020 3:25 PM

## 2020-11-20 MED ORDER — RISPERIDONE 2 MG PO TBDP
6.0000 mg | ORAL_TABLET | Freq: Every day | ORAL | Status: DC
  Administered 2020-11-20 – 2020-11-23 (×4): 6 mg via ORAL
  Filled 2020-11-20 (×5): qty 3

## 2020-11-20 NOTE — Progress Notes (Signed)
Recreation Therapy Notes  5.18.22  1100:  LRT went to patient room to complete recreation therapy assessment.  Pt was lying in bed and stated he did not want to complete assessment, he just wants to go home.    Caroll Rancher, LRT/CTRS     Caroll Rancher A 11/20/2020 12:03 PM

## 2020-11-20 NOTE — BHH Counselor (Signed)
CSW attempted to complete PSA with this patient. Patient continued to voice "I want to go home" to every question CSW asked. Pt did provide verbal consent for CSW to speak to his family.      Ruthann Cancer MSW, LCSW Clincal Social Worker  [facility]

## 2020-11-20 NOTE — Progress Notes (Signed)
Pt remains electively mute with "Yes and no" answers or none at all. Pt denies SI, HI, AVH and pain "Yes /no". OOB for medications and meals with increased verbal prompts. Seen briefly pacing in hall at intervals, turning around to check behind him without initiating interaction with others. Took his medications with increased verbal prompts. Pt remains guarded, suspicious with flat affect. Ambulatory with a steady gait. Pt did not attend groups as scheduled, isolative to room majority of this shift.  Q 15 minutes safety checks maintained without self harm gestures or outburst. All medications given as ordered with verbal education and effects monitored. Support and encouragement provided to pt this shift.  Pt is safe on and off unit. Denies concerns at this time.

## 2020-11-20 NOTE — Tx Team (Signed)
Interdisciplinary Treatment and Diagnostic Plan Update  11/20/2020 Time of Session: 9:35am  [name] [name] MRN: [id number]  Principal Diagnosis: <principal problem not specified>  Secondary Diagnoses: Active Problems:   Schizophrenia ([place])   Current Medications:  Current Facility-Administered Medications  Medication Dose Route Frequency Provider Last Rate Last Admin  . acetaminophen (TYLENOL) tablet 650 mg  650 mg Oral Q6H PRN Ival Bible, MD      . alum & mag hydroxide-simeth (MAALOX/MYLANTA) 200-200-20 MG/5ML suspension 30 mL  30 mL Oral Q4H PRN Ival Bible, MD      . benztropine (COGENTIN) tablet 1 mg  1 mg Oral BID Ival Bible, MD   1 mg at 11/20/20 1015  . haloperidol (HALDOL) tablet 10 mg  10 mg Oral BID PRN Sharma Covert, MD       Or  . haloperidol lactate (HALDOL) injection 10 mg  10 mg Intramuscular BID PRN Sharma Covert, MD      . hydrOXYzine (ATARAX/VISTARIL) tablet 25 mg  25 mg Oral TID PRN Ival Bible, MD      . OLANZapine zydis (ZYPREXA) disintegrating tablet 10 mg  10 mg Oral Q8H PRN Ival Bible, MD       And  . LORazepam (ATIVAN) tablet 1 mg  1 mg Oral PRN Ival Bible, MD       And  . ziprasidone (GEODON) injection 20 mg  20 mg Intramuscular PRN Ival Bible, MD      . magnesium hydroxide (MILK OF MAGNESIA) suspension 30 mL  30 mL Oral Daily PRN Ival Bible, MD      . nicotine (NICODERM CQ - dosed in mg/24 hours) patch 21 mg  21 mg Transdermal Daily Lovena Le, Cody W, PA-C      . risperiDONE (RISPERDAL M-TABS) disintegrating tablet 2 mg  2 mg Oral Daily Sharma Covert, MD   2 mg at 11/20/20 1015  . risperiDONE (RISPERDAL M-TABS) disintegrating tablet 6 mg  6 mg Oral QHS Sharma Covert, MD      . traZODone (DESYREL) tablet 100 mg  100 mg Oral QHS Ival Bible, MD   100 mg at 11/19/20 2057   PTA Medications: Medications Prior to Admission  Medication Sig Dispense Refill Last Dose   . benztropine (COGENTIN) 1 MG tablet Take 1 tablet (1 mg total) by mouth 2 (two) times daily.     . risperiDONE (RISPERDAL) 3 MG tablet Take 2 tablets (6 mg total) by mouth at bedtime.     . traZODone (DESYREL) 100 MG tablet Take 1 tablet (100 mg total) by mouth at bedtime.       Patient Stressors: Other: being here at [facility]  Patient Strengths: Capable of independent living Physical Health Supportive family/friends  Treatment Modalities: Medication Management, Group therapy, Case management,  1 to 1 session with clinician, Psychoeducation, Recreational therapy.   Physician Treatment Plan for Primary Diagnosis: <principal problem not specified> Long Term Goal(s): Improvement in symptoms so as ready for discharge Improvement in symptoms so as ready for discharge   Short Term Goals: Ability to identify changes in lifestyle to reduce recurrence of condition will improve Ability to verbalize feelings will improve Ability to demonstrate self-control will improve Ability to identify and develop effective coping behaviors will improve Ability to maintain clinical measurements within normal limits will improve Compliance with prescribed medications will improve Ability to identify triggers associated with substance abuse/mental health issues will improve Ability to identify changes in lifestyle  to reduce recurrence of condition will improve Ability to verbalize feelings will improve Ability to demonstrate self-control will improve Ability to identify and develop effective coping behaviors will improve Ability to maintain clinical measurements within normal limits will improve Compliance with prescribed medications will improve Ability to identify triggers associated with substance abuse/mental health issues will improve  Medication Management: Evaluate patient's response, side effects, and tolerance of medication regimen.  Therapeutic Interventions: 1 to 1 sessions, Unit Group sessions and  Medication administration.  Evaluation of Outcomes: Not Met  Physician Treatment Plan for Secondary Diagnosis: Active Problems:   Schizophrenia ([place])  Long Term Goal(s): Improvement in symptoms so as ready for discharge Improvement in symptoms so as ready for discharge   Short Term Goals: Ability to identify changes in lifestyle to reduce recurrence of condition will improve Ability to verbalize feelings will improve Ability to demonstrate self-control will improve Ability to identify and develop effective coping behaviors will improve Ability to maintain clinical measurements within normal limits will improve Compliance with prescribed medications will improve Ability to identify triggers associated with substance abuse/mental health issues will improve Ability to identify changes in lifestyle to reduce recurrence of condition will improve Ability to verbalize feelings will improve Ability to demonstrate self-control will improve Ability to identify and develop effective coping behaviors will improve Ability to maintain clinical measurements within normal limits will improve Compliance with prescribed medications will improve Ability to identify triggers associated with substance abuse/mental health issues will improve     Medication Management: Evaluate patient's response, side effects, and tolerance of medication regimen.  Therapeutic Interventions: 1 to 1 sessions, Unit Group sessions and Medication administration.  Evaluation of Outcomes: Not Met   RN Treatment Plan for Primary Diagnosis: <principal problem not specified> Long Term Goal(s): Knowledge of disease and therapeutic regimen to maintain health will improve  Short Term Goals: Ability to remain free from injury will improve, Ability to participate in decision making will improve, Ability to verbalize feelings will improve, Ability to disclose and discuss suicidal ideas, Ability to identify and develop effective coping  behaviors will improve and Compliance with prescribed medications will improve  Medication Management: RN will administer medications as ordered by provider, will assess and evaluate patient's response and provide education to patient for prescribed medication. RN will report any adverse and/or side effects to prescribing provider.  Therapeutic Interventions: 1 on 1 counseling sessions, Psychoeducation, Medication administration, Evaluate responses to treatment, Monitor vital signs and CBGs as ordered, Perform/monitor CIWA, COWS, AIMS and Fall Risk screenings as ordered, Perform wound care treatments as ordered.  Evaluation of Outcomes: Not Met   LCSW Treatment Plan for Primary Diagnosis: <principal problem not specified> Long Term Goal(s): Safe transition to appropriate next level of care at discharge, Engage patient in therapeutic group addressing interpersonal concerns.  Short Term Goals: Engage patient in aftercare planning with referrals and resources, Increase social support, Increase ability to appropriately verbalize feelings, Increase emotional regulation, Facilitate acceptance of mental health diagnosis and concerns, Identify triggers associated with mental health/substance abuse issues and Increase skills for wellness and recovery  Therapeutic Interventions: Assess for all discharge needs, 1 to 1 time with Social worker, Explore available resources and support systems, Assess for adequacy in community support network, Educate family and significant other(s) on suicide prevention, Complete Psychosocial Assessment, Interpersonal group therapy.  Evaluation of Outcomes: Not Met   Progress in Treatment: Attending groups: Yes. Participating in groups: Yes. Taking medication as prescribed: Yes. Toleration medication: Yes. Family/Significant other contact made: Yes,  individual(s) contacted:  If consents are provided Patient understands diagnosis: No. Discussing patient identified  problems/goals with staff: No. Medical problems stabilized or resolved: Yes. Denies suicidal/homicidal ideation: Yes. Issues/concerns per patient self-inventory: No.   New problem(s) identified: No, Describe:  None   New Short Term/Long Term Goal(s): medication stabilization, elimination of SI thoughts, development of comprehensive mental wellness plan.   Patient Goals: Did not attend   Discharge Plan or Barriers: Patient recently admitted. CSW will continue to follow and assess for appropriate referrals and possible discharge planning.   Reason for Continuation of Hospitalization: Hallucinations Medication stabilization  Estimated Length of Stay: 3 to 5 days  Attendees: Patient: Did not attend  11/20/2020   Physician: Myles Lipps, MD  11/20/2020   Nursing:  11/20/2020   RN Care Manager: 11/20/2020   Social Worker: Verdis Frederickson, [place]  11/20/2020   Recreational Therapist:  11/20/2020   Other:  11/20/2020   Other:  11/20/2020   Other: 11/20/2020     Scribe for Treatment Team: Darleen Crocker, [place] 11/20/2020 1:30 PM

## 2020-11-20 NOTE — Progress Notes (Signed)
Recreation Therapy Notes  Date: 5.18.22 Time: 0950 Location: [address] Dayroom  Group Topic: Self-Esteem  Goal Area(s) Addresses:  Patient will successfully identify positive attributes about themselves.  Patient will successfully identify benefit of improved self-esteem.   Intervention: House outline; Markers  Activity: Things That Make Me:  Patients were given an outline of a house broken up into eleven sections (foundation, basement, 1st floor, 2nd floor, Attic, [employer], [place], Door and [place] sign).  When complete, patients will share what they wrote with the group.    Education:  Self-Esteem, Discharge Planning  Education Outcome: Acknowledges education/In group clarification offered/Needs additional education  Clinical Observations/Feedback: Pt did not attend group session.    Caroll Rancher, LRT/CTRS         Caroll Rancher A 11/20/2020 11:58 AM
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
## 2020-11-20 NOTE — [facility] Group Notes (Signed)
Did not attend am orientation group despite being offered x 2.  

## 2020-11-21 ENCOUNTER — Telehealth (HOSPITAL_COMMUNITY): Payer: Self-pay | Admitting: Emergency Medicine

## 2020-11-21 LAB — LIPID PANEL
Cholesterol: 135 mg/dL (ref 0–200)
HDL: 41 mg/dL (ref >40)
LDL Cholesterol: 69 mg/dL (ref 0–99)
Total CHOL/HDL Ratio: 3.3 RATIO
Triglycerides: 125 mg/dL (ref <150)
VLDL: 25 mg/dL (ref 0–40)

## 2020-11-21 LAB — HEMOGLOBIN A1C
Hgb A1c MFr Bld: 5.4 % (ref 4.8–5.6)
Mean Plasma Glucose: 108.28 mg/dL

## 2020-11-21 NOTE — Progress Notes (Signed)
Pt did not attend orientation group.  

## 2020-11-21 NOTE — Progress Notes (Signed)
Pt remains guarded, suspicious of others, isolative to room with intermittent pacing in hall. Refused to join groups or even engaged during movies or music sessions with peers despite multiple prompts. Affect is flat, he is minimal in his interactions with staff as well just "yes and no" answers still when engaged. Cooperative with lab work. Denies SI, HI, AVH and pain with "no". Reported he slept well with good appetite. Remains medication compliant.  Emotional support and encouragement offered to pt this shift. Q 15 minutes safety checks maintained without outburst thus far. All medications given as ordered with verbal education and effects monitored. Pt tolerates all meals and medications well without discomfort. Remains safe on and off unit. Denies concerns at this time with head node "no".

## 2020-11-21 NOTE — BH Assessment (Signed)
Care Management - Follow Up [facility] Discharges   Patient was placed in an inpatient psychiatric facility at [facility].   

## 2020-11-21 NOTE — Progress Notes (Signed)
   11/20/20 2200  Psych Admission Type (Psych Patients Only)  Admission Status Involuntary  Psychosocial Assessment  Patient Complaints None  Eye Contact Brief  Facial Expression Flat  Affect Flat  Speech Other (Comment) (not speaking)  Interaction Minimal  Motor Activity Pacing;Restless  Appearance/Hygiene Unremarkable  Behavior Characteristics Cooperative  Mood Suspicious;Preoccupied  Thought Process  Coherency WDL  Content UTA (Pt answers "Yes & no" and will not hold a conversation with staff. Observed with elective mutism)  Delusions UTA  Perception UTA  Hallucination UTA  Judgment Poor  Confusion UTA  Danger to Self  Current suicidal ideation? Denies  Danger to Others  Danger to Others None reported or observed  Danger to Others Abnormal  Harmful Behavior to others No threats or harm toward other people  Destructive Behavior No threats or harm toward property

## 2020-11-21 NOTE — [facility] Group Notes (Signed)
Occupational Therapy Group Note Date: 11/21/2020 Group Topic/Focus: Coping Skills  Group Description: Group encouraged increased engagement and participation through discussion and activity focused on Mental Health Awareness Month. May is Mental Health Awareness month and patients engaged in discussion surrounding the stigma surrounding mental health and feelings that they wanted to share/express. Patients were encouraged to engage in activity in which they created mental health ribbons that represented different diagnoses, symptoms, and things in which they would like to bring awareness too. Completed ribbons were then displayed around the unit for mental health awareness month.  Participation Level: Patient did not attend OT group session despite personal invitation. Pt was up and around the unit, coming in and out of group room, however declined to engage in activity.    Plan: Continue to engage patient in OT groups 2 - 3x/week.  11/21/2020  Donne Hazel, MOT, OTR/L

## 2020-11-21 NOTE — Progress Notes (Signed)
Psychoeducational Group Note  Date:  11/21/2020 Time:  2034  Group Topic/Focus:  Wrap-Up Group:   The focus of this group is to help patients review their daily goal of treatment and discuss progress on daily workbooks.  Participation Level: Did Not Attend  Participation Quality:  Not Applicable  Affect:  Not Applicable  Cognitive:  Not Applicable  Insight:  Not Applicable  Engagement in Group: Not Applicable  Additional Comments:  The patient did not attend group this evening.   Hazle Coca S 11/21/2020, 8:34 PM

## 2020-11-21 NOTE — Progress Notes (Signed)
Recreation Therapy Notes  Date: 11/21/2020 Time: 1030a  Topic: Leisure Education   Goal Area(s) Addresses:  Patient will review packet supporting identification of healthy leisure and recreation activities.   Intervention: Independent Workbook   Education: Healthy Leisure Selection, Lifestyle Changes, Behavioral Activation, Discharge Planning   Comments: LRT provided pt a workbook reviewing leisure and its impact on mood and personal fulfillment. Pt given the option to complete the packet in the dayroom with RN staff and peers or at their own pace in their room.   Navy Rothschild, LRT/CTRS 

## 2020-11-21 NOTE — [facility] Group Notes (Addendum)
Type of Therapy and Topic: Group Therapy: Setting Goals  Participation Level:Did not attend.  Description of Group: In this process group, patients discussed using strengths to work toward goals and address challenges. Patients identified two positive things about themselves and one goal they were working on. Patients were given the opportunity to share openly and support each other's plan for self-empowerment. The group discussed the value of gratitude and were encouraged to have a daily reflection of positive characteristics or circumstances. Patients were encouraged to identify a plan to utilize their strengths to work on current challenges and goals.  Therapeutic Goals 1. Patient will verbalize personal strengths/positive qualities and relate how these can assist with achieving desired personal goals 2. Patients will verbalize affirmation of peers plans for personal change and goal setting 3. Patients will explore the value of gratitude and positive focus as related to successful achievement of goals 4. Patients will verbalize a plan for regular reinforcement of personal positive qualities and circumstances.  Summary of Patient Progress: Did not attend   Kiran Lapine MSW, LCSW Clincal Social Worker  [facility] 
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
## 2020-11-22 MED ORDER — PALIPERIDONE PALMITATE ER 234 MG/1.5ML IM SUSY
234.0000 mg | PREFILLED_SYRINGE | Freq: Once | INTRAMUSCULAR | Status: AC
  Administered 2020-11-22: 234 mg via INTRAMUSCULAR

## 2020-11-22 MED ORDER — CARBAMAZEPINE 100 MG PO CHEW
200.0000 mg | CHEWABLE_TABLET | Freq: Two times a day (BID) | ORAL | Status: DC
  Administered 2020-11-22 – 2020-11-25 (×7): 200 mg via ORAL
  Filled 2020-11-22 (×2): qty 2
  Filled 2020-11-22 (×2): qty 28
  Filled 2020-11-22: qty 2
  Filled 2020-11-22: qty 28
  Filled 2020-11-22 (×2): qty 2
  Filled 2020-11-22: qty 28
  Filled 2020-11-22 (×2): qty 2

## 2020-11-22 NOTE — Plan of Care (Signed)
Patient was cooperative with treatment, but still has bizzare behavior like standing in the hallway just starring out into space, he is medication compliant although. He remains anxious and answers questions with I don't know answers.

## 2020-11-22 NOTE — Progress Notes (Incomplete)
Adult Psychoeducational Group Note  Date:  11/22/2020 Time:  11:32 PM  Group Topic/Focus:  Wrap-Up Group:   The focus of this group is to help patients review their daily goal of treatment and discuss progress on daily workbooks.  Participation Level:  Active  Participation Quality:  Appropriate  Affect:  Anxious  Cognitive:  Disorganized and Confused  Insight: Limited  Engagement in Group:  Limited  Modes of Intervention:  Discussion  Additional Comments:    [name] 11/22/2020, 11:32 PM

## 2020-11-22 NOTE — [facility] Group Notes (Signed)
SPIRITUALITY GROUP NOTE   Spirituality group facilitated by Kathleen Argue, BCC.   Group Description: Group focused on topic of hope. Patients participated in facilitated discussion around topic, connecting with one another around experiences and definitions for hope. Group members engaged with visual explorer photos, reflecting on what hope looks like for them today. Group engaged in discussion around how their definitions of hope are present today in hospital.   Modalities: Psycho-social ed, Adlerian, Narrative, MI   Patient Progress: [name] [name] entered the room during group and was invited to participate. When I asked his name, he stated, "Tell me which name you want to give me." He left the room shortly after entering.    [address], Bcc Pager, [phone]

## 2020-11-22 NOTE — [facility] Group Notes (Signed)
[facility] LCSW Group Therapy  5/20/202211:03 AM  Type of Therapy:Group Therapy   Summary of Progress/Problems:CSW was unable to complete group due to MHT group in process.    Mearle Drew A Athina Fahey 11/22/2020,11:03 AM 

## 2020-11-22 NOTE — [facility] Group Notes (Signed)
[facility] Group Notes:  (Nursing/MHT/Case Management/Adjunct)  Date:  11/22/2020  Time: 0830  Type of Therapy:  Psychoeducational Skills  Participation Level:  Minimal  Participation Quality:  Resistant  Affect:  Flat  Cognitive:  Lacking  Insight:  Lacking  Engagement in Group:  Lacking  Modes of Intervention:  Discussion  Summary of Progress/Problems: pt is very blunted. Forwards little states goal is to ''get a cigarette'' when informed we can't smoke but can get nicotine patch he states '' that's fucked upIran Ouch, Latonda Larrivee 11/22/2020, 2:14 PM

## 2020-11-22 NOTE — Progress Notes (Signed)
Recreation Therapy Notes  Date: 11/22/2020 Time: 930a  Topic: Stress Management   Goal Area(s) Addresses:  Patient will review and complete packet supporting identification of stressors and and techniques to combat compounding stress.   Intervention: Independent Workbook   Education: Stress, Time Management, Coping Skills, Lifestyle Changes, Discharge Planning   Comments: LRT provided pt a workbook reviewing stress management concepts and offering an opportunity to create a plan to address stressors. Pt given the option to complete the packet in the dayroom with RN/MHT staff and peers or at their own pace in their room.   Sharelle Burditt, LRT/CTRS 

## 2020-11-22 NOTE — [facility] Counselor (Signed)
Adult Comprehensive Assessment  Patient ID: [name] [name], male   DOB: 03/13/1981, 40 y.o.   MRN: [id number]    CSW unable to complete PSA with this pt due to this patient not answering questions and making the same statement to each question of "I want to go home."    Living/Environment/Situation:  Living Arrangements: Parent,Other relatives     Summary/Recommendations:  CSW was unable to complete full assessment with this pt. [name] [name] is a 40 year old male who presented to [facility] for paranoid psychotic symptoms. While at [facility], pt would like to work on "going home."   Pt currently lives with parents. Pt's highest level of education is a GED. Pt is currently receiving disability income.  While here, [name] [name] can benefit from crisis stabilization, medication management, therapeutic milieu, and referrals for services.      [name] [name] A [name] [name]. 11/22/2020

## 2020-11-22 NOTE — [facility] Suicide Risk Assessment (Signed)
[facility] INPATIENT:  Family/Significant Other Suicide Prevention Education  Suicide Prevention Education:  Contact Attempts: mother, Hlim Wolfman ([phone]) [phone],  has been identified by the patient as the family member/significant other with whom the patient will be residing, and identified as the person(s) who will aid the patient in the event of a mental health crisis.  With written consent from the patient, two attempts were made to provide suicide prevention education, prior to and/or following the patient's discharge.  We were unsuccessful in providing suicide prevention education.  A suicide education pamphlet was given to the patient to share with family/significant other.  Date and time of first attempt:11/22/2020 at 11:30am CSW left HIPAA complaint voicemail for this patients mother. Date and time of second attempt: Second attempt still needs to be made.   Toshika Parrow A Doranne Schmutz 11/22/2020, 11:27 AM

## 2020-11-22 NOTE — Progress Notes (Signed)
Adult Psychoeducational Group Note  Date:  11/22/2020 Time:  11:32 PM  Group Topic/Focus:  Wrap-Up Group:   The focus of this group is to help patients review their daily goal of treatment and discuss progress on daily workbooks.  Participation Level:  Active  Participation Quality:  Appropriate  Affect:  Anxious  Cognitive:  Disorganized and Confused  Insight: Limited  Engagement in Group:  Limited  Modes of Intervention:  Discussion  Additional Comments:  Pt stated his goal for today was to focus on his treatment plan. Pt stated he accomplished his goal today. Pt stated he talked with his doctor and social worker about his care today. Pt rated his overall day a 10. Pt stated he made no calls today. Pt stated he felt better about himself today. Pt stated he attend all groups held today. Pt stated he was able to attend all meals. Pt stated he took all medications provided today. Pt stated his appetite was pretty good today. Pt rated sleep last night was pretty good. Pt stated the goal tonight was to get some rest. Pt stated he had no physical pain today. Pt deny visual hallucinations and auditory issues tonight. Pt denies thoughts of harming himself or others. Pt stated he would alert staff if anything changed.  [name] 11/22/2020, 11:32 PM
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
## 2020-11-23 NOTE — [facility] Group Notes (Signed)
.  Psychoeducational Group Note  Date: 11/23/2020 Time: 0900-1000    Goal Setting   Purpose of Group: This group helps to provide patients with the steps of setting a goal that is specific, measurable, attainable, realistic and time specific. A discussion on how we keep ourselves stuck with negative self talk.    Participation Level:  Did not attend   Nigel Ericsson A  

## 2020-11-23 NOTE — Progress Notes (Signed)
D: Pt appeared to be more anxious today than previous day with this Clinical research associate as well as the group facilitator. However, when asked about his day the pt stated, "good". Stated he didn't want to go outside because of the bugs. Stated, "I woke up with a whole bunch of maggots on me the other day". Writer asked pt if he thought the maggots were real or did he believe he was seeing things. Pt stated they were real and coming out of his stomach. Pt requested a cig, but Clinical research associate reminded it's a no smoking facility and offered gum or patch. Pt refused both. Pt has no other questions or concerns.   A:  Support and encouragement was offered. 15 min checks continued for safety.  R: Pt remains safe.

## 2020-11-23 NOTE — Progress Notes (Signed)
Adult Psychoeducational Group Note  Date:  11/23/2020 Time:  8:43 PM  Group Topic/Focus:  Wrap-Up Group:   The focus of this group is to help patients review their daily goal of treatment and discuss progress on daily workbooks.  Participation Level:  None  Participation Quality:  Resistant  Affect:  Anxious, Irritable, Lethargic and Resistant  Cognitive:  Disorganized and Confused  Insight: None  Engagement in Group:  Resistant  Modes of Intervention:  Discussion  Additional Comments: Pt stated all he wanted was a cigarette and had no interest in participating in group tonight. Writer updated pt on [facility] been smoke free and informed him to talk with his nurse about other options that would help solve his nicotine craving.  [name] 11/23/2020, 8:43 PM
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
## 2020-11-23 NOTE — Progress Notes (Signed)
   11/23/20 1500  Psych Admission Type (Psych Patients Only)  Admission Status Involuntary  Psychosocial Assessment  Patient Complaints None  Eye Contact Brief  Facial Expression Flat  Affect Flat  Speech Soft ("Yes / No")  Interaction Minimal  Motor Activity Pacing;Restless  Appearance/Hygiene Unremarkable  Behavior Characteristics Cooperative  Mood Preoccupied  Thought Process  Coherency Tangential;Disorganized  Content Delusions  Delusions None reported or observed  Perception UTA  Hallucination None reported or observed  Judgment WDL  Confusion Mild  Danger to Self  Current suicidal ideation? Denies  Danger to Others  Danger to Others None reported or observed  Danger to Others Abnormal  Harmful Behavior to others No threats or harm toward other people  Destructive Behavior No threats or harm toward property  Dar Note: Patient observed pacing the hallway for majority of this shift.  Medication given as prescribed.  Routine safety checks maintained.  Patient is safe on and off the unit.
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
## 2020-11-24 MED ORDER — RISPERIDONE 3 MG PO TBDP
6.0000 mg | ORAL_TABLET | Freq: Every day | ORAL | Status: DC
  Administered 2020-11-24: 6 mg via ORAL
  Filled 2020-11-24 (×3): qty 14

## 2020-11-24 MED ORDER — TRAZODONE HCL 150 MG PO TABS
150.0000 mg | ORAL_TABLET | Freq: Every day | ORAL | Status: DC
  Administered 2020-11-24: 150 mg via ORAL
  Filled 2020-11-24: qty 7
  Filled 2020-11-24 (×2): qty 1

## 2020-11-24 MED ORDER — AMLODIPINE BESYLATE 5 MG PO TABS
5.0000 mg | ORAL_TABLET | Freq: Every day | ORAL | Status: DC
  Administered 2020-11-24 – 2020-11-25 (×2): 5 mg via ORAL
  Filled 2020-11-24 (×4): qty 1
  Filled 2020-11-24: qty 7

## 2020-11-24 NOTE — Progress Notes (Signed)
Progress note    11/24/20 0742  Psych Admission Type (Psych Patients Only)  Admission Status Involuntary  Psychosocial Assessment  Patient Complaints Anxiety;Suspiciousness;Worrying  Eye Contact Fair  Facial Expression Anxious;Pensive  Affect Anxious;Preoccupied  Speech Logical/coherent;Soft  Interaction Assertive  Motor Activity Pacing;Restless  Appearance/Hygiene In scrubs  Behavior Characteristics Cooperative;Appropriate to situation;Anxious  Mood Anxious;Suspicious;Preoccupied;Pleasant  Thought Administrator, sports thinking  Content Delusions  Delusions Paranoid  Perception Hallucinations  Hallucination Tactile;Visual  Judgment Poor  Confusion None  Danger to Self  Current suicidal ideation? Denies  Danger to Others  Danger to Others None reported or observed

## 2020-11-24 NOTE — Plan of Care (Signed)
  Problem: Activity: Goal: Sleeping patterns will improve Outcome: Progressing   Problem: Coping: Goal: Ability to verbalize frustrations and anger appropriately will improve Outcome: Progressing Goal: Ability to demonstrate self-control will improve Outcome: Progressing   

## 2020-11-24 NOTE — Progress Notes (Signed)
D: Patient presents with pleasant affect at time of assessment. Patient reports that he is ready to go home and plans to make changes to his habits upon discharge. Patient denies SI/HI at this time. Patient also denies AH/VH at this time. Patient contracts for safety.  A: Provided positive reinforcement and encouragement.  R: Patient cooperative and receptive to efforts. Patient remains safe on the unit.   11/24/20 2033  Psych Admission Type (Psych Patients Only)  Admission Status Involuntary  Psychosocial Assessment  Patient Complaints None  Eye Contact Fair  Facial Expression Pensive  Affect Appropriate to circumstance  Speech Logical/coherent;Soft  Interaction Assertive  Motor Activity Pacing  Appearance/Hygiene Unremarkable  Behavior Characteristics Cooperative;Appropriate to situation  Mood Pleasant  Thought Process  Coherency Concrete thinking  Content Delusions  Delusions Paranoid  Perception WDL  Hallucination None reported or observed  Judgment Poor  Confusion None  Danger to Self  Current suicidal ideation? Denies  Danger to Others  Danger to Others None reported or observed

## 2020-11-24 NOTE — Progress Notes (Signed)
Adult Psychoeducational Group Note  Date:  11/24/2020 Time:  9:25 PM  Group Topic/Focus:  Wrap-Up Group:   The focus of this group is to help patients review their daily goal of treatment and discuss progress on daily workbooks.  Participation Level:  Minimal  Participation Quality:  Appropriate  Affect:  Anxious, Excited and Irritable  Cognitive:  Disorganized and Confused  Insight: Limited  Engagement in Group:  Limited, Off Topic and Poor  Modes of Intervention:  Discussion  Additional Comments:   Pt stated his goal for today was to focus on his treatment plan. Pt stated he accomplished his goal today. Pt stated he talked with his doctor and social worker about his care today. Pt rated his overall day a 10. Pt stated he made no calls today. Pt stated he felt better about himself today. Pt stated he attend all groups held today. Pt stated he was able to attend all meals. Pt stated he took all medications provided today. Pt stated his appetite was pretty good today. Pt rated sleep last night was pretty good. Pt stated the goal tonight was to get some rest. Pt stated he had no physical pain today. Pt deny visual hallucinations and auditory issues tonight. Pt denies thoughts of harming himself or others. Pt stated he would alert staff if anything changed.  [name] 11/24/2020, 9:25 PM

## 2020-11-24 NOTE — [facility] Group Notes (Signed)
Adult Psychoeducational Group Not Date:  11/24/2020 Time:  [id number] Group Topic/Focus: PROGRESSIVE RELAXATION. A group where deep breathing is taught and tensing and relaxation muscle groups is used. Imagery is used as well.  Pts are asked to imagine 3 pillars that hold them up when they are not able to hold themselves up.  Participation Level:  Active  Participation Quality:  Appropriate  Affect:  flat  Cognitive:  limited  Insight: Improving  Engagement in Group:  Engaged  Modes of Intervention:  Activity, Discussion, Education, and Support  Additional Comments:  Pt rates his energy at a 10. Then left the group.  Dione Housekeeper
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
## 2020-11-25 DIAGNOSIS — F200 Paranoid schizophrenia: Principal | ICD-10-CM

## 2020-11-25 LAB — CBC WITH DIFFERENTIAL/PLATELET
Abs Immature Granulocytes: 0.01 10*3/uL (ref 0.00–0.07)
Basophils Absolute: 0 10*3/uL (ref 0.0–0.1)
Basophils Relative: 1 %
Eosinophils Absolute: 0.2 10*3/uL (ref 0.0–0.5)
Eosinophils Relative: 3 %
HCT: 44 % (ref 39.0–52.0)
Hemoglobin: 13.8 g/dL (ref 13.0–17.0)
Immature Granulocytes: 0 %
Lymphocytes Relative: 17 %
Lymphs Abs: 1.2 10*3/uL (ref 0.7–4.0)
MCH: 26.5 pg (ref 26.0–34.0)
MCHC: 31.4 g/dL (ref 30.0–36.0)
MCV: 84.5 fL (ref 80.0–100.0)
Monocytes Absolute: 0.7 10*3/uL (ref 0.1–1.0)
Monocytes Relative: 10 %
Neutro Abs: 4.6 10*3/uL (ref 1.7–7.7)
Neutrophils Relative %: 69 %
Platelets: 179 10*3/uL (ref 150–400)
RBC: 5.21 MIL/uL (ref 4.22–5.81)
RDW: 13 % (ref 11.5–15.5)
WBC: 6.6 10*3/uL (ref 4.0–10.5)
nRBC: 0 % (ref 0.0–0.2)

## 2020-11-25 LAB — HEPATIC FUNCTION PANEL
ALT: 19 U/L (ref 0–44)
AST: 18 U/L (ref 15–41)
Albumin: 4.5 g/dL (ref 3.5–5.0)
Alkaline Phosphatase: 59 U/L (ref 38–126)
Bilirubin, Direct: <0.1 mg/dL (ref 0.0–0.2)
Total Bilirubin: 0.5 mg/dL (ref 0.3–1.2)
Total Protein: 8 g/dL (ref 6.5–8.1)

## 2020-11-25 LAB — CARBAMAZEPINE LEVEL, TOTAL: Carbamazepine Lvl: 8 ug/mL (ref 4.0–12.0)

## 2020-11-25 LAB — TSH: TSH: 0.491 u[IU]/mL (ref 0.350–4.500)

## 2020-11-25 MED ORDER — CARBAMAZEPINE 100 MG PO CHEW: 200.0000 mg | CHEWABLE_TABLET | Freq: Two times a day (BID) | ORAL | 0 refills | Status: DC

## 2020-11-25 MED ORDER — TRAZODONE HCL 150 MG PO TABS: 150.0000 mg | ORAL_TABLET | Freq: Every day | ORAL | 0 refills | Status: DC

## 2020-11-25 MED ORDER — RISPERIDONE 2 MG PO TBDP: 2.0000 mg | ORAL_TABLET | Freq: Every day | ORAL | 0 refills | Status: DC

## 2020-11-25 MED ORDER — AMLODIPINE BESYLATE 5 MG PO TABS: 5.0000 mg | ORAL_TABLET | Freq: Every day | ORAL | 0 refills | Status: DC

## 2020-11-25 MED ORDER — RISPERIDONE 3 MG PO TBDP: 6.0000 mg | ORAL_TABLET | Freq: Every day | ORAL | 0 refills | Status: DC

## 2020-11-25 MED ORDER — BENZTROPINE MESYLATE 1 MG PO TABS: 1.0000 mg | ORAL_TABLET | Freq: Two times a day (BID) | ORAL | 0 refills | Status: DC

## 2020-11-25 MED ORDER — HYDROXYZINE HCL 25 MG PO TABS: 25.0000 mg | ORAL_TABLET | Freq: Three times a day (TID) | ORAL | 0 refills | Status: DC | PRN

## 2020-11-25 NOTE — [facility] Suicide Risk Assessment (Signed)
[facility] INPATIENT:  Family/Significant Other Suicide Prevention Education  Suicide Prevention Education:  Education Completed; mother, [name] [name] [phone]) [phone], has been identified by the patient as the family member/significant other with whom the patient will be residing, and identified as the person(s) who will aid the patient in the event of [name] mental health crisis (suicidal ideations/suicide attempt).  With written consent from the patient, the family member/significant other has been provided the following suicide prevention education, prior to the and/or following the discharge of the patient.  CSW spoke with patients mother who denied having safety concerns at this time. Patient is to be picked up by mother at discharge.   The suicide prevention education provided includes the following:  Suicide risk factors  Suicide prevention and interventions  National Suicide Hotline telephone number  [facility] assessment telephone number  [facility] Emergency Assistance 911  [facility] and/or Residential Mobile Crisis Unit telephone number  Request made of family/significant other to:  Remove weapons (e.g., guns, rifles, knives), all items previously/currently identified as safety concern.    Remove drugs/medications (over-the-counter, prescriptions, illicit drugs), all items previously/currently identified as [name] safety concern.  The family member/significant other verbalizes understanding of the suicide prevention education information provided.  The family member/significant other agrees to remove the items of safety concern listed above.  [name] [name] [name] [name] 11/25/2020, 9:45 AM

## 2020-11-25 NOTE — Tx Team (Signed)
Interdisciplinary Treatment and Diagnostic Plan Update  11/25/2020 Time of Session: 9:35am  [name] [name] MRN: [id number]  Principal Diagnosis: Schizophrenia [facility])  Secondary Diagnoses: Principal Problem:   Schizophrenia (HCC)   Current Medications:  Current Facility-Administered Medications  Medication Dose Route Frequency Provider Last Rate Last Admin  . acetaminophen (TYLENOL) tablet 650 mg  650 mg Oral Q6H PRN Estella Husk, MD      . alum & mag hydroxide-simeth (MAALOX/MYLANTA) 200-200-20 MG/5ML suspension 30 mL  30 mL Oral Q4H PRN Estella Husk, MD      . amLODipine (NORVASC) tablet 5 mg  5 mg Oral Daily Antonieta Pert, MD   5 mg at 11/25/20 5885  . benztropine (COGENTIN) tablet 1 mg  1 mg Oral BID Estella Husk, MD   1 mg at 11/25/20 0277  . carbamazepine (TEGRETOL) chewable tablet 200 mg  200 mg Oral BID Antonieta Pert, MD   200 mg at 11/25/20 4128  . haloperidol (HALDOL) tablet 10 mg  10 mg Oral BID PRN Antonieta Pert, MD       Or  . haloperidol lactate (HALDOL) injection 10 mg  10 mg Intramuscular BID PRN Antonieta Pert, MD      . hydrOXYzine (ATARAX/VISTARIL) tablet 25 mg  25 mg Oral TID PRN Estella Husk, MD   25 mg at 11/24/20 2033  . OLANZapine zydis (ZYPREXA) disintegrating tablet 10 mg  10 mg Oral Q8H PRN Estella Husk, MD       And  . LORazepam (ATIVAN) tablet 1 mg  1 mg Oral PRN Estella Husk, MD       And  . ziprasidone (GEODON) injection 20 mg  20 mg Intramuscular PRN Estella Husk, MD      . magnesium hydroxide (MILK OF MAGNESIA) suspension 30 mL  30 mL Oral Daily PRN Estella Husk, MD      . nicotine (NICODERM CQ - dosed in mg/24 hours) patch 21 mg  21 mg Transdermal Daily Ladona Ridgel, Cody W, PA-C      . risperiDONE (RISPERDAL M-TABS) disintegrating tablet 2 mg  2 mg Oral Daily Antonieta Pert, MD   2 mg at 11/25/20 0802  . risperiDONE (RISPERDAL M-TABS) disintegrating tablet 6 mg  6 mg Oral QHS  Antonieta Pert, MD   6 mg at 11/24/20 2034  . traZODone (DESYREL) tablet 150 mg  150 mg Oral QHS Antonieta Pert, MD   150 mg at 11/24/20 2034   PTA Medications: Medications Prior to Admission  Medication Sig Dispense Refill Last Dose  . risperiDONE (RISPERDAL) 3 MG tablet Take 2 tablets (6 mg total) by mouth at bedtime.     . traZODone (DESYREL) 100 MG tablet Take 1 tablet (100 mg total) by mouth at bedtime.     . [DISCONTINUED] benztropine (COGENTIN) 1 MG tablet Take 1 tablet (1 mg total) by mouth 2 (two) times daily.       Patient Stressors: Other: being here at [facility]  Patient Strengths: Capable of independent living Physical Health Supportive family/friends  Treatment Modalities: Medication Management, Group therapy, Case management,  1 to 1 session with clinician, Psychoeducation, Recreational therapy.   Physician Treatment Plan for Primary Diagnosis: Schizophrenia (HCC) Long Term Goal(s): Improvement in symptoms so as ready for discharge Improvement in symptoms so as ready for discharge   Short Term Goals: Ability to identify changes in lifestyle to reduce recurrence of condition will improve Ability to verbalize feelings will improve  Ability to demonstrate self-control will improve Ability to identify and develop effective coping behaviors will improve Ability to maintain clinical measurements within normal limits will improve Compliance with prescribed medications will improve Ability to identify triggers associated with substance abuse/mental health issues will improve Ability to identify changes in lifestyle to reduce recurrence of condition will improve Ability to verbalize feelings will improve Ability to demonstrate self-control will improve Ability to identify and develop effective coping behaviors will improve Ability to maintain clinical measurements within normal limits will improve Compliance with prescribed medications will improve Ability to identify  triggers associated with substance abuse/mental health issues will improve  Medication Management: Evaluate patient's response, side effects, and tolerance of medication regimen.  Therapeutic Interventions: 1 to 1 sessions, Unit Group sessions and Medication administration.  Evaluation of Outcomes: Adequate for Discharge  Physician Treatment Plan for Secondary Diagnosis: Principal Problem:   Schizophrenia (HCC)  Long Term Goal(s): Improvement in symptoms so as ready for discharge Improvement in symptoms so as ready for discharge   Short Term Goals: Ability to identify changes in lifestyle to reduce recurrence of condition will improve Ability to verbalize feelings will improve Ability to demonstrate self-control will improve Ability to identify and develop effective coping behaviors will improve Ability to maintain clinical measurements within normal limits will improve Compliance with prescribed medications will improve Ability to identify triggers associated with substance abuse/mental health issues will improve Ability to identify changes in lifestyle to reduce recurrence of condition will improve Ability to verbalize feelings will improve Ability to demonstrate self-control will improve Ability to identify and develop effective coping behaviors will improve Ability to maintain clinical measurements within normal limits will improve Compliance with prescribed medications will improve Ability to identify triggers associated with substance abuse/mental health issues will improve     Medication Management: Evaluate patient's response, side effects, and tolerance of medication regimen.  Therapeutic Interventions: 1 to 1 sessions, Unit Group sessions and Medication administration.  Evaluation of Outcomes: Adequate for Discharge   RN Treatment Plan for Primary Diagnosis: Schizophrenia (HCC) Long Term Goal(s): Knowledge of disease and therapeutic regimen to maintain health will  improve  Short Term Goals: Ability to remain free from injury will improve, Ability to participate in decision making will improve, Ability to verbalize feelings will improve, Ability to disclose and discuss suicidal ideas, Ability to identify and develop effective coping behaviors will improve and Compliance with prescribed medications will improve  Medication Management: RN will administer medications as ordered by provider, will assess and evaluate patient's response and provide education to patient for prescribed medication. RN will report any adverse and/or side effects to prescribing provider.  Therapeutic Interventions: 1 on 1 counseling sessions, Psychoeducation, Medication administration, Evaluate responses to treatment, Monitor vital signs and CBGs as ordered, Perform/monitor CIWA, COWS, AIMS and Fall Risk screenings as ordered, Perform wound care treatments as ordered.  Evaluation of Outcomes: Adequate for Discharge   LCSW Treatment Plan for Primary Diagnosis: Schizophrenia [facility]) Long Term Goal(s): Safe transition to appropriate next level of care at discharge, Engage patient in therapeutic group addressing interpersonal concerns.  Short Term Goals: Engage patient in aftercare planning with referrals and resources, Increase social support, Increase ability to appropriately verbalize feelings, Increase emotional regulation, Facilitate acceptance of mental health diagnosis and concerns, Identify triggers associated with mental health/substance abuse issues and Increase skills for wellness and recovery  Therapeutic Interventions: Assess for all discharge needs, 1 to 1 time with Social worker, Explore available resources and support systems, Assess for adequacy  in community support network, Educate family and significant other(s) on suicide prevention, Complete Psychosocial Assessment, Interpersonal group therapy.  Evaluation of Outcomes: Adequate for Discharge   Progress in  Treatment: Attending groups: Yes. Participating in groups: Yes. Taking medication as prescribed: Yes. Toleration medication: Yes. Family/Significant other contact made: Yes, individual(s) contacted:  If consents are provided Patient understands diagnosis: No. Discussing patient identified problems/goals with staff: No. Medical problems stabilized or resolved: Yes. Denies suicidal/homicidal ideation: Yes. Issues/concerns per patient self-inventory: No.   New problem(s) identified: No, Describe:  None   New Short Term/Long Term Goal(s): medication stabilization, elimination of SI thoughts, development of comprehensive mental wellness plan.   Patient Goals: Did not attend   Discharge Plan or Barriers: Patient recently admitted. CSW will continue to follow and assess for appropriate referrals and possible discharge planning.   Reason for Continuation of Hospitalization: Hallucinations Medication stabilization  Estimated Length of Stay: 3 to 5 days  Attendees: Patient: Did not attend  11/20/2020   Physician: Landry Mellow, MD  11/20/2020   Nursing:  11/20/2020   RN Care Manager: 11/20/2020   Social Worker: Melba Coon, LCSWA  11/20/2020   Recreational Therapist:  11/20/2020   Other:  11/20/2020   Other:  11/20/2020   Other: 11/20/2020     Scribe for Treatment Team: Felizardo Hoffmann, LCSWA 11/25/2020 11:58 AM

## 2020-11-25 NOTE — Progress Notes (Signed)
  [facility] Adult Case Management Discharge Plan :  Will you be returning to the same living situation after discharge:  No. Will be staying with mother At discharge, do you have transportation home?: Yes,  mother to pick this patient up Do you have the ability to pay for your medications: Yes,  has insurance    Release of information consent forms completed and in the chart;  Patient's signature needed at discharge.  Patient to Follow up at:  Follow-up Information    [facility]. Go on 11/27/2020.   Specialty: Behavioral Health Why: You have an appointment for therapy services on 11/27/20 at 7:45 am.  You also have an appointment for medication management on 12/17/20 at 7:45 am.  These appointments will be held in person and are first come, first served.   Contact information: [address] [place] [address] [phone]              Next level of care provider has access to [facility] Link:yes  Safety Planning and Suicide Prevention discussed: Yes,  with mother  Have you used any form of tobacco in the last 30 days? (Cigarettes, Smokeless Tobacco, Cigars, and/or Pipes): Yes  Has patient been referred to the Quitline?: Patient refused referral  Patient has been referred for addiction treatment: Pt. refused referral  Otelia Santee, LCSW 11/25/2020, 10:07 AM

## 2020-11-25 NOTE — [facility] Suicide Risk Assessment (Signed)
[facility] Discharge Suicide Risk Assessment   Principal Problem: <principal problem not specified> Discharge Diagnoses: Active Problems:   Schizophrenia (HCC)   Total Time spent with patient: 20 minutes  Musculoskeletal: Strength & Muscle Tone: within normal limits Gait & Station: normal Patient leans: N/A  Psychiatric Specialty Exam  Presentation  General Appearance: Appropriate for Environment  Eye Contact:Fair  Speech:Normal Rate  Speech Volume:Normal  Handedness:Right   Mood and Affect  Mood:Euthymic  Duration of Depression Symptoms: Greater than two weeks  Affect:Flat   Thought Process  Thought Processes:Goal Directed  Descriptions of Associations:Circumstantial  Orientation:Full (Time, Place and Person)  Thought Content:Delusions; Paranoid Ideation  History of Schizophrenia/Schizoaffective disorder:Yes  Duration of Psychotic Symptoms:Greater than six months  Hallucinations:Hallucinations: None  Ideas of Reference:Delusions; Paranoia  Suicidal Thoughts:Suicidal Thoughts: No  Homicidal Thoughts:Homicidal Thoughts: No   Sensorium  Memory:Immediate Fair; Recent Fair; Remote Fair  Judgment:Fair  Insight:Fair   Executive Functions  Concentration:Fair  Attention Span:Fair  Recall:Fair  Fund of Knowledge:Fair  Language:Fair   Psychomotor Activity  Psychomotor Activity:Psychomotor Activity: Normal   Assets  Assets:Desire for Improvement; Housing; Resilience; Social Support   Sleep  Sleep:Sleep: Good Number of Hours of Sleep: 6   Physical Exam: Physical Exam Vitals and nursing note reviewed.  Constitutional:      Appearance: Normal appearance.  HENT:     Head: Normocephalic and atraumatic.  Pulmonary:     Effort: Pulmonary effort is normal.  Neurological:     General: No focal deficit present.     Mental Status: He is alert and oriented to person, place, and time.    Review of Systems  All other systems reviewed and are  negative.  Blood pressure 125/76, pulse 92, temperature 98.3 F (36.8 C), temperature source Oral, resp. rate 18, height 5\' 3"  (1.6 m), weight 63.5 kg, SpO2 100 %. Body mass index is 24.8 kg/m.  Mental Status Per Nursing Assessment::   On Admission:  NA  Demographic Factors:  Male, Low socioeconomic status and Unemployed  Loss Factors: NA  Historical Factors: Impulsivity  Risk Reduction Factors:   Living with another person, especially a relative and Positive social support  Continued Clinical Symptoms:  Schizophrenia:   Less than 58 years old Paranoid or undifferentiated type  Cognitive Features That Contribute To Risk:  Thought constriction (tunnel vision)    Suicide Risk:  Minimal: No identifiable suicidal ideation.  Patients presenting with no risk factors but with morbid ruminations; may be classified as minimal risk based on the severity of the depressive symptoms   Follow-up Information    [facility]. Go on 11/27/2020.   Specialty: Behavioral Health Why: You have an appointment for therapy services on 11/27/20 at 7:45 am.  You also have an appointment for medication management on 12/17/20 at 7:45 am.  These appointments will be held in person and are first come, first served.   Contact information: [address] [place] [phone]              Plan Of Care/Follow-up recommendations:  Activity:  ad lib  [phone], MD 11/25/2020, 9:06 AM

## 2020-11-25 NOTE — Progress Notes (Signed)
Pt discharged to lobby. Pt was stable and appreciative at that time. All papers, samples and prescriptions were given and valuables returned. Verbal understanding expressed. Denies SI/HI and A/VH. Pt given opportunity to express concerns and ask questions.  

## 2020-11-25 NOTE — Discharge Summary (Signed)
Physician Discharge Summary Note  Patient:  [name] [name] is an 40 y.o., male MRN:  [id number] DOB:  1980/09/20 Patient phone:  [phone] (home)  Patient address:   [address] [place] [address],  Total Time spent with patient: 30 minutes  Date of Admission:  11/19/2020 Date of Discharge: 11/25/2020  Reason for Admission:  (From MD's admission note): Patient is a 40 year old male with a reported past psychiatric history significant for schizophrenia versus schizoaffective disorder as well as psychoactive substance induced psychosis who presented to the [facility] emergency department on 11/17/2020 after he threatened his mother and exhibited aggressive behavior towards her. She also reported that he sees things that are not there and refuses to take medications because "people are trying to kill him". On examination today the patient is thought blocking. He stares out a window and does not speak. He did come into the office and is able to understand what I am saying. While in the emergency department he was assessed by the comprehensive clinical assessment team. He had been petition for involuntary commitment by his mother as per above. It was noted that he did not respond any questions during that assessment. He has a history of using alcohol, marijuana, cocaine and methamphetamines. He also has a history of medication noncompliance. Per the electronic medical record he has threatened family members in the past. He has been psychiatrically hospitalized several times, and most recently at [facility] in [place], [place] in August 2021. His most recent psychiatric hospitalization in our facility was in 01/31/2019. His medications at that time included Cogentin 1 mg p.o. twice daily, Risperdal 6 mg p.o. nightly as well as trazodone 100 mg p.o. nightly. He was transferred to the behavioral health urgent care center. He did speak to the psychiatric team at  the behavioral health urgent care center on 11/18/2020. It was noted that he had refused Risperdal. He was noted to be extremely guarded. It was noted that he appeared to be responding to internal stimuli. It was noted that he was largely uncooperative with assessment and stated "it does not matter". He was transferred to our facility on 11/19/2020. Unfortunately in care everywhere we do not have any information from any of the previous psychiatric hospitalizations.  Evaluation on the unit today: Patient is seen and evaluated. He denies paranoia, auditory and visual hallucinations and suicidal/homicidal ideation. He does not appear to be responding to internal stimuli. He has improved since his hospital stay. He has been taking his medications as prescribed and has no issues with them. He initially was refusing to take his medications and was placed on a forced medication order. He did take his medications by mouth and did not require any injections during this hospitalization. He is sleeping well, he slept 6 hours last night. He reported a good appetite. He has attended  group therapy. He is calm and cooperative. He will be going home with his mother today. He has follow up appointments as listed in his discharge paperwork. His medications were sent to the pharamacy on file in his chart. Patient is stable for discharge today.   Principal Problem: Schizophrenia [facility]) Discharge Diagnoses: Active Problems:   Schizophrenia [facility])  Past Psychiatric History: See H&P  Past Medical History:  Past Medical History:  Diagnosis Date  . Anxiety   . Depression   . Mental disorder   . Schizophrenia (HCC)    History reviewed. No pertinent surgical history. Family History:  Family History  Problem  Relation Age of Onset  . Mental illness Neg Hx    Family Psychiatric  History: See H&P Social History:  Social History   Substance and Sexual Activity  Alcohol Use Not Currently   Comment: haven't drank in  months"     Social History   Substance and Sexual Activity  Drug Use Yes  . 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 Marital status: Married    Spouse name: Not on file  . Number of children: 1  . Years of education: Not on file  . Highest education level: Not on file  Occupational History  . Occupation: Disabled  Tobacco Use  . Smoking status: Current Every Day Smoker    Packs/day: 1.00    Types: Cigarettes  . Smokeless tobacco: Former Clinical biochemist  . Vaping Use: Unknown  Substance and Sexual Activity  . Alcohol use: Not Currently    Comment: haven't drank in months"  . 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 Sexual activity: Yes    Birth control/protection: None  Other Topics Concern  . Not on file  Social History Narrative   Pt refusing to answer any questions during assessment. Pt lives with mother who IVC'd him.      Pt lives with parents and sister.  He is married, but currently separated.  Pt is on disability.   Social Determinants of Health   Financial Resource Strain: Not on file  Food Insecurity: Not on file  Transportation Needs: Not on file  Physical Activity: Not on file  Stress: Not on file  Social Connections: Not on file    Hospital Course:  After the above admission evaluation, [name] [name]'s presenting symptoms were noted. He was recommended for mood stabilization treatments. The medication regimen targeting those presenting symptoms were discussed with him & initiated with his consent. He was restarted on Risperdal, which was titrated to manage his psychosis. He is also on Tegretol for mood stabilization, Cogentin for EPS, Trazodone for insomnia and Vistaril PRN for anxiety.  He received paliperidone 234 mg IM LAI on 5/20. He was scheduled to receive his second dose of paliperidone 156 mg on 5/27. Since he is being discharged  before this date, he can discuss with his medication management provider whether to continue the LAI. He will continue on Risperdal PO after discharge for the added benefit of symptom control. His medication management appoint is scheduled for 6/14. His UDS and BAL on arrival to the ED were negative. He was however medicated, stabilized & discharged on the medications as listed on his discharge medication list below. Besides the mood stabilization treatments, [name] [name] was also enrolled & participated in the group counseling sessions being offered & held on this unit. He learned coping skills. He presented no other significant pre-existing medical issues that required treatment. He tolerated his treatment regimen without any adverse effects or reactions reported.   During the course of his hospitalization, the 15-minute checks were adequate to ensure patient's safety. [name] [name] did not display any dangerous, violent or suicidal behavior on the unit.  He interacted with patients & staff appropriately, participated appropriately in the group sessions/therapies. His medications were addressed & adjusted to meet his needs. He was recommended for outpatient follow-up care & medication management upon discharge to assure continuity of care & mood stability.  At the time  of discharge patient is not reporting any acute suicidal/homicidal ideations. He feels more confident about his self-care & in managing his mental health. He currently denies any new issues or concerns. Education and supportive counseling provided throughout his hospital stay & upon discharge.   Today upon his discharge evaluation with the attending psychiatrist, [name] [name] shares he is doing well. He denies any other specific concerns. He is sleeping well, he slept 6 hours last night. His appetite is good. He denies other physical complaints. He denies AH/VH, delusional thoughts or paranoia. He does not appear to be responding to any internal stimuli. He feels  that his medications have been helpful & is in agreement to continue his current treatment regimen as recommended. He was able to engage in safety planning [facility] or contact emergency services if he feels unable to maintain his own safety or the safety of others. Pt had no further questions, comments, or concerns. [facility] in no apparent distress. Transportation home per his mother.   Physical Findings: AIMS: Facial and Oral Movements Muscles of Facial Expression: None, normal Lips and Perioral Area: None, normal Jaw: None, normal Tongue: None, normal,Extremity Movements Upper (arms, wrists, hands, fingers): None, normal Lower (legs, knees, ankles, toes): None, normal, Trunk Movements Neck, shoulders, hips: None, normal, Overall Severity Severity of abnormal movements (highest score from questions above): None, normal Incapacitation due to abnormal movements: None, normal Patient's awareness of abnormal movements (rate only patient's report): No Awareness, Dental Status Current problems with teeth and/or dentures?: No Does patient usually wear dentures?: No  CIWA:    COWS:     Musculoskeletal: Strength & Muscle Tone: within normal limits Gait & Station: normal Patient leans: N/A  Psychiatric Specialty Exam:  Presentation  General Appearance: Appropriate for Environment  Eye Contact:Fair  Speech:Normal Rate  Speech Volume:Normal  Handedness:Right  Mood and Affect  Mood:Euthymic  Affect:Flat  Thought Process  Thought Processes:Goal Directed  Descriptions of Associations:Circumstantial  Orientation:Full (Time, Place and Person)  Thought Content:Delusions; Paranoid Ideation  History of Schizophrenia/Schizoaffective disorder:Yes  Duration of Psychotic Symptoms:Greater than six months  Hallucinations:Hallucinations: None  Ideas of Reference:Delusions; Paranoia  Suicidal Thoughts:Suicidal Thoughts:  No  Homicidal Thoughts:Homicidal Thoughts: No  Sensorium  Memory:Immediate Fair; Recent Fair; Remote Fair  Judgment:Fair  Insight:Fair  Executive Functions  Concentration:Fair  Attention Span:Fair  Recall:Fair  Fund of Knowledge:Fair  Language:Fair  Psychomotor Activity  Psychomotor Activity:Psychomotor Activity: Normal  Assets  Assets:Desire for Improvement; Housing; Resilience; Social Support  Sleep  Sleep:Sleep: Good Number of Hours of Sleep: 6  Physical Exam: Physical Exam Vitals and nursing note reviewed.  Constitutional:      Appearance: Normal appearance.  HENT:     Head: Normocephalic.  Pulmonary:     Effort: Pulmonary effort is normal.  Musculoskeletal:        General: Normal range of motion.     Cervical back: Normal range of motion.  Neurological:     Mental Status: He is alert and oriented to person, place, and time.  Psychiatric:        Attention and Perception: Attention normal. He does not perceive auditory or visual hallucinations.        Mood and Affect: Mood normal.        Speech: Speech normal.        Behavior: Behavior normal. Behavior is cooperative.        Thought Content: Thought content normal. Thought content is not paranoid or delusional.  Thought content does not include homicidal or suicidal ideation. Thought content does not include homicidal or suicidal plan.        Cognition and Memory: Cognition normal.    Review of Systems  Constitutional: Negative for fever.  HENT: Negative for congestion, sinus pain and sore throat.   Respiratory: Negative for cough, shortness of breath and wheezing.   Cardiovascular: Negative for chest pain.  Gastrointestinal: Negative.   Genitourinary: Negative.   Musculoskeletal: Negative.   Neurological: Negative.    Blood pressure 125/76, pulse 92, temperature 98.3 F (36.8 C), temperature source Oral, resp. rate 18, height 5\' 3"  (1.6 m), weight 63.5 kg, SpO2 100 %. Body mass index is 24.8  kg/m.   Have you used any form of tobacco in the last 30 days? (Cigarettes, Smokeless Tobacco, Cigars, and/or Pipes): Yes  Has this patient used any form of tobacco in the last 30 days? (Cigarettes, Smokeless Tobacco, Cigars, and/or Pipes) Yes, Yes, A prescription for an FDA-approved tobacco cessation medication was offered at discharge and the patient refused  Blood Alcohol level:  Lab Results  Component Value Date   [facility] <10 11/17/2020   ETH <10 02/28/2020    Metabolic Disorder Labs:  Lab Results  Component Value Date   HGBA1C 5.4 11/21/2020   MPG 108.28 11/21/2020   MPG 105.41 10/30/2018   Lab Results  Component Value Date   PROLACTIN 45.8 (H) 12/19/2017   PROLACTIN 7.0 04/04/2015   Lab Results  Component Value Date   CHOL 135 11/21/2020   TRIG 125 11/21/2020   HDL 41 11/21/2020   CHOLHDL 3.3 11/21/2020   VLDL 25 11/21/2020   LDLCALC 69 11/21/2020   LDLCALC 83 10/30/2018    See Psychiatric Specialty Exam and Suicide Risk Assessment completed by Attending Physician prior to discharge.  Discharge destination:  Home  Is patient on multiple antipsychotic therapies at discharge:  Yes,   Do you recommend tapering to monotherapy for antipsychotics?  No   Has Patient had three or more failed trials of antipsychotic monotherapy by history:  No  Recommended Plan for Multiple Antipsychotic Therapies: NA  Discharge Instructions    Diet - low sodium heart healthy   Complete by: As directed    Increase activity slowly   Complete by: As directed      Allergies as of 11/25/2020   No Known Allergies     Medication List    STOP taking these medications   risperiDONE 3 MG tablet Commonly known as: RISPERDAL Replaced by: risperiDONE 2 MG disintegrating tablet     TAKE these medications     Indication  amLODipine 5 MG tablet Commonly known as: NORVASC Take 1 tablet (5 mg total) by mouth daily. Start taking on: Nov 26, 2020  Indication: High Blood Pressure  Disorder   benztropine 1 MG tablet Commonly known as: COGENTIN Take 1 tablet (1 mg total) by mouth 2 (two) times daily.  Indication: Extrapyramidal Reaction caused by Medications   carbamazepine 100 MG chewable tablet Commonly known as: TEGRETOL Chew 2 tablets (200 mg total) by mouth 2 (two) times daily.  Indication: schizophrenia   hydrOXYzine 25 MG tablet Commonly known as: ATARAX/VISTARIL Take 1 tablet (25 mg total) by mouth 3 (three) times daily as needed for anxiety.  Indication: Feeling Anxious   risperidone 3 MG disintegrating tablet Commonly known as: RISPERDAL M-TABS Take 2 tablets (6 mg total) by mouth at bedtime.  Indication: Schizophrenia   risperiDONE 2 MG disintegrating tablet Commonly known as: RISPERDAL  M-TABS Take 1 tablet (2 mg total) by mouth daily. Start taking on: Nov 26, 2020 Replaces: risperiDONE 3 MG tablet  Indication: Schizophrenia   traZODone 150 MG tablet Commonly known as: DESYREL Take 1 tablet (150 mg total) by mouth at bedtime. What changed:   medication strength  how much to take  Indication: Trouble Sleeping       Follow-up Information    [facility]. Go on 11/27/2020.   Specialty: Behavioral Health Why: You have an appointment for therapy services on 11/27/20 at 7:45 am.  You also have an appointment for medication management on 12/17/20 at 7:45 am.  These appointments will be held in person and are first come, first served.   Contact information: [address] [place] [address] [phone]              Follow-up recommendations:  Activity:  as tolerated Diet:  Heart healthy  Comments:  Prescriptions were sent to the pharmacy on file at discharge.  Patient agreeable to plan.  Patient was given opportunity to ask questions.  He appears to feel comfortable with discharge and denies any current suicidal or homicidal thoughts.  Patient is instructed prior to discharge to: Take all  medications as prescribed by his mental healthcare provider. Report any adverse effects and or reactions from the medicines to his outpatient provider promptly. Patient has been instructed & cautioned: To not engage in alcohol and or illegal drug use while on prescription medicines. In the event of worsening symptoms, patient is instructed to call the crisis hotline, 911 and or go to the nearest ED for appropriate evaluation and treatment of symptoms. To follow-up with his primary care provider for your other medical issues, concerns and or health care needs.   Signed: Laveda Abbe, NP 11/25/2020, 9:49 AM

## 2022-03-23 ENCOUNTER — Ambulatory Visit (HOSPITAL_COMMUNITY)
Admission: EM | Admit: 2022-03-23 | Discharge: 2022-03-23 | Disposition: A | Discharge status: Home / Self Care | Payer: Medicare Other | Attending: Psychiatry | Admitting: Psychiatry

## 2022-03-23 DIAGNOSIS — R4689 Other symptoms and signs involving appearance and behavior: Secondary | ICD-10-CM | POA: Diagnosis not present

## 2022-03-23 DIAGNOSIS — F17200 Nicotine dependence, unspecified, uncomplicated: Secondary | ICD-10-CM | POA: Diagnosis not present

## 2022-03-23 DIAGNOSIS — F1990 Other psychoactive substance use, unspecified, uncomplicated: Secondary | ICD-10-CM | POA: Diagnosis not present

## 2022-03-23 DIAGNOSIS — F200 Paranoid schizophrenia: Secondary | ICD-10-CM | POA: Diagnosis not present

## 2022-03-23 DIAGNOSIS — R4589 Other symptoms and signs involving emotional state: Secondary | ICD-10-CM

## 2022-03-23 DIAGNOSIS — Z91148 Patient's other noncompliance with medication regimen for other reason: Secondary | ICD-10-CM | POA: Insufficient documentation

## 2022-03-23 DIAGNOSIS — F259 Schizoaffective disorder, unspecified: Secondary | ICD-10-CM | POA: Diagnosis present

## 2022-03-23 NOTE — ED Provider Notes (Signed)
Behavioral Health Urgent Care Medical Screening Exam  Patient Name: [name] [name] MRN: [id number] Date of Evaluation: 03/24/22 Chief Complaint:   Diagnosis:  Final diagnoses:  H/O medication noncompliance  Anxious appearance  Aggression    History of Present illness: [name] [name] is a 41 y.o. male. With a history of paranoid schizophrenia, aggressive behavior, polysubstance use, homicidal ideation, schizoaffective disorder.  Presented to [facility] via [employer],  because he was out of his Olanzapine medication.  According to patient he takes 2 pills each night and already has missed 2 days of his medication. Patient is followed by an ACTT, TTS was able to contact the team and they stated that they will have his medicine ready for him in the morning. According the after hours ACTT patient does have refill of all his medication. However they will have his nurse coordinate everything in the AM.   NP double check patient medication history but did not see a medication on the list for olanzapine.    TTS notes, Pt called [employer] for transportation to [facility]. Pt says he is out of his alazopine. He says he gets 2 pills a night. He has missed two days worth of the medication. He cannot find the prescription. The precription is filled at [facility]. He says he has ACTT services from [facility]. He says he has not called them in a few days. He denies any SI, Hi. He is not sure about whether he has been seeing things. He gets a shot every three months but he cannot recall teh medication. Pt he is wanting to get to his job tomorrow. He used some meth about a week ago. Appetite and sleep are reported to be WNL.  Face-to-face observation of patient patient is alert and oriented x 4, speech is clear maintained eye contact.  Mood is agitated affect congruent with mood.  Patient denies SI, HI, AVH.  Patient reports he smoked meth a week ago, according to patient I always stop smoking when I get my medicines. After talking with  patient he stated can I go now.   Recommend discharge for patient to f/u with act team in the AM   Psychiatric Specialty Exam  Presentation  General Appearance:Casual  Eye Contact:Fair  Speech:Clear and Coherent  Speech Volume:Normal  Handedness:Right   Mood and Affect  Mood:Anxious  Affect:Flat   Thought Process  Thought Processes:Goal Directed  Descriptions of Associations:Circumstantial  Orientation:Full (Time, Place and Person)  Thought Content:Rumination  Diagnosis of Schizophrenia or Schizoaffective disorder in past: No data recorded Duration of Psychotic Symptoms: No data recorded Hallucinations:None  Ideas of Reference:None  Suicidal Thoughts:No  Homicidal Thoughts:No   Sensorium  Memory:Immediate Fair  Judgment:Fair  Insight:Fair   Executive Functions  Concentration:Fair  Attention Span:Fair  [place]   Psychomotor Activity  Psychomotor Activity:Normal   Assets  Assets:Desire for Improvement; Social Support   Sleep  Sleep:Fair  Number of hours: 6   No data recorded  Physical Exam: Physical Exam HENT:     Head: Normocephalic.     Nose: Nose normal.  Cardiovascular:     Rate and Rhythm: Normal rate.  Pulmonary:     Effort: Pulmonary effort is normal.  Musculoskeletal:        General: Normal range of motion.     Cervical back: Normal range of motion.  Neurological:     General: No focal deficit present.     Mental Status: He is alert.  Psychiatric:  Mood and Affect: Mood normal.    Review of Systems  Constitutional: Negative.   HENT: Negative.    Eyes: Negative.   Respiratory: Negative.    Cardiovascular: Negative.   Gastrointestinal: Negative.   Genitourinary: Negative.   Musculoskeletal: Negative.   Skin: Negative.   Neurological: Negative.   Endo/Heme/Allergies: Negative.   Psychiatric/Behavioral:  The patient is nervous/anxious.    There were no  vitals taken for this visit. There is no height or weight on file to calculate BMI.  Musculoskeletal: Strength & Muscle Tone: within normal limits Gait & Station: normal Patient leans: N/A   [place] MSE Discharge Disposition for Follow up and Recommendations: Based on my evaluation the patient does not appear to have an emergency medical condition and can be discharged with resources and follow up care in outpatient services for Medication Management   Evette Georges, NP 03/24/2022, 4:14 AM

## 2022-03-23 NOTE — Discharge Instructions (Signed)
F/u to follow up with ACTT in the AM

## 2022-03-23 NOTE — BH Assessment (Signed)
Patient gave permission to call [facility].  Clinician spoke to [facility] who said that patient is prescribed alanzopine and that he should have refill.  He said that if patient can make it through the night, their nurse will call him tomorrow about his medication.

## 2022-03-23 NOTE — Progress Notes (Signed)
Pt called [employer] for transportation to [facility].  Pt says he is out of his alazopine.  He says he gets 2 pills a night.  He has missed two days worth of the medication.  He cannot find the prescription.  The precription is filled at [facility].  He says he has ACTT services from [facility].  He says he has not called them in a few days.  He denies any SI, Hi.  He is not sure about whether he has been seeing things.  He gets a shot every three months but he cannot recall teh medication.  Pt he is wanting to get to his job tomorrow.  He used some meth about a week ago.  Appetite and sleep are reported to be WNL.  Pt gave permission to call [facility].  Pt is routine.

## 2022-03-24 ENCOUNTER — Telehealth (HOSPITAL_COMMUNITY): Payer: Self-pay | Admitting: General Practice

## 2022-03-24 NOTE — BH Assessment (Signed)
Care Management - [place] Follow Up Discharges   Writer attempted to make contact with patient today and was unsuccessful.  Writer left a HIPPA compliant voice message.   Per chart review, patient will follow up with his established ACTT Team provider.

## 2022-07-23 ENCOUNTER — Emergency Department (HOSPITAL_COMMUNITY)
Admission: EM | Admit: 2022-07-23 | Discharge: 2022-07-23 | Disposition: A | Discharge status: Home / Self Care | Payer: 59 | Attending: Emergency Medicine | Admitting: Emergency Medicine

## 2022-07-23 ENCOUNTER — Other Ambulatory Visit: Payer: Self-pay

## 2022-07-23 DIAGNOSIS — Z5321 Procedure and treatment not carried out due to patient leaving prior to being seen by health care provider: Secondary | ICD-10-CM | POA: Insufficient documentation

## 2022-07-23 DIAGNOSIS — M25512 Pain in left shoulder: Secondary | ICD-10-CM | POA: Diagnosis present

## 2022-07-23 MED ORDER — ACETAMINOPHEN 325 MG PO TABS
650.0000 mg | ORAL_TABLET | Freq: Once | ORAL | Status: AC
  Administered 2022-07-23: 650 mg via ORAL
  Filled 2022-07-23: qty 2

## 2022-07-23 NOTE — ED Triage Notes (Signed)
PT states that an [facility] nurse came by house today approx 3 hours ago to give mental health medication and now arm hurts. Pt states he doesn't know the med but he doesn't like shots and normally takes pills.

## 2022-07-23 NOTE — ED Notes (Signed)
Pt stated he was leaving because he needed to go home at this time. Pt alert and oriented and in no apparent distress at the time he left. PA notified.

## 2022-07-23 NOTE — ED Provider Triage Note (Signed)
Emergency Medicine Provider Triage Evaluation Note  [name] [name] , a 42 y.o. male  was evaluated in triage.  Pt complains of  left shoulder pain after injection today.   Review of Systems  Positive:  Negative:   Physical Exam  BP (!) 136/97 (BP Location: Left Arm)   Pulse 95   Temp 98.5 F (36.9 C) (Oral)   Resp 16   Ht 5\' 3"  (1.6 m)   Wt 63.5 kg   SpO2 100%   BMI 24.80 kg/m  Gen:   Awake, no distress   Resp:  Normal effort  MSK:   Moves extremities without difficulty  Other:  No signs of trauma. No point tenderness.  Medical Decision Making  Medically screening exam initiated at 8:52 PM.  Appropriate orders placed.  [name] [name] was informed that the remainder of the evaluation will be completed by another provider, this initial triage assessment does not replace that evaluation, and the importance of remaining in the ED until their evaluation is complete.  XR ordered   Sherrell Puller, PA-C 07/23/22 2055

## 2022-10-05 ENCOUNTER — Ambulatory Visit (HOSPITAL_COMMUNITY)
Admission: EM | Admit: 2022-10-05 | Discharge: 2022-10-06 | Disposition: A | Discharge status: Other Acute Inpatient Hospital | Payer: 59 | Attending: Behavioral Health | Admitting: Behavioral Health

## 2022-10-05 ENCOUNTER — Encounter (HOSPITAL_COMMUNITY): Payer: Self-pay | Admitting: Behavioral Health

## 2022-10-05 ENCOUNTER — Other Ambulatory Visit: Payer: Self-pay

## 2022-10-05 DIAGNOSIS — F17210 Nicotine dependence, cigarettes, uncomplicated: Secondary | ICD-10-CM | POA: Insufficient documentation

## 2022-10-05 DIAGNOSIS — Z9151 Personal history of suicidal behavior: Secondary | ICD-10-CM | POA: Diagnosis not present

## 2022-10-05 DIAGNOSIS — Z1152 Encounter for screening for COVID-19: Secondary | ICD-10-CM | POA: Insufficient documentation

## 2022-10-05 DIAGNOSIS — F200 Paranoid schizophrenia: Secondary | ICD-10-CM | POA: Diagnosis not present

## 2022-10-05 DIAGNOSIS — Z91148 Patient's other noncompliance with medication regimen for other reason: Secondary | ICD-10-CM | POA: Insufficient documentation

## 2022-10-05 LAB — MAGNESIUM: Magnesium: 2 mg/dL (ref 1.7–2.4)

## 2022-10-05 LAB — COMPREHENSIVE METABOLIC PANEL
ALT: 40 U/L (ref 0–44)
AST: 22 U/L (ref 15–41)
Albumin: 3.8 g/dL (ref 3.5–5.0)
Alkaline Phosphatase: 63 U/L (ref 38–126)
Anion gap: 13 (ref 5–15)
BUN: 9 mg/dL (ref 6–20)
CO2: 25 mmol/L (ref 22–32)
Calcium: 9.2 mg/dL (ref 8.9–10.3)
Chloride: 103 mmol/L (ref 98–111)
Creatinine, Ser: 0.78 mg/dL (ref 0.61–1.24)
GFR, Estimated: >60 mL/min (ref >60)
Glucose, Bld: 72 mg/dL (ref 70–99)
Potassium: 3.9 mmol/L (ref 3.5–5.1)
Sodium: 141 mmol/L (ref 135–145)
Total Bilirubin: 0.5 mg/dL (ref 0.3–1.2)
Total Protein: 7 g/dL (ref 6.5–8.1)

## 2022-10-05 LAB — CBC WITH DIFFERENTIAL/PLATELET
Abs Immature Granulocytes: 0.07 10*3/uL (ref 0.00–0.07)
Basophils Absolute: 0.1 10*3/uL (ref 0.0–0.1)
Basophils Relative: 1 %
Eosinophils Absolute: 0.2 10*3/uL (ref 0.0–0.5)
Eosinophils Relative: 2 %
HCT: 43.9 % (ref 39.0–52.0)
Hemoglobin: 13.6 g/dL (ref 13.0–17.0)
Immature Granulocytes: 1 %
Lymphocytes Relative: 26 %
Lymphs Abs: 2.2 10*3/uL (ref 0.7–4.0)
MCH: 25.1 pg — ABNORMAL LOW (ref 26.0–34.0)
MCHC: 31 g/dL (ref 30.0–36.0)
MCV: 81.1 fL (ref 80.0–100.0)
Monocytes Absolute: 0.7 10*3/uL (ref 0.1–1.0)
Monocytes Relative: 9 %
Neutro Abs: 5.2 10*3/uL (ref 1.7–7.7)
Neutrophils Relative %: 61 %
Platelets: 284 10*3/uL (ref 150–400)
RBC: 5.41 MIL/uL (ref 4.22–5.81)
RDW: 15 % (ref 11.5–15.5)
WBC: 8.5 10*3/uL (ref 4.0–10.5)
nRBC: 0 % (ref 0.0–0.2)

## 2022-10-05 LAB — ETHANOL: Alcohol, Ethyl (B): <10 mg/dL (ref <10)

## 2022-10-05 LAB — LIPID PANEL
Cholesterol: 156 mg/dL (ref 0–200)
HDL: 40 mg/dL — ABNORMAL LOW (ref >40)
LDL Cholesterol: 93 mg/dL (ref 0–99)
Total CHOL/HDL Ratio: 3.9 RATIO
Triglycerides: 116 mg/dL (ref <150)
VLDL: 23 mg/dL (ref 0–40)

## 2022-10-05 LAB — POCT URINE DRUG SCREEN - MANUAL ENTRY (I-SCREEN)
POC Amphetamine UR: NOT DETECTED
POC Buprenorphine (BUP): NOT DETECTED
POC Cocaine UR: NOT DETECTED
POC Marijuana UR: NOT DETECTED
POC Methadone UR: NOT DETECTED
POC Methamphetamine UR: NOT DETECTED
POC Morphine: NOT DETECTED
POC Oxazepam (BZO): NOT DETECTED
POC Oxycodone UR: NOT DETECTED
POC Secobarbital (BAR): NOT DETECTED

## 2022-10-05 LAB — LITHIUM LEVEL: Lithium Lvl: 0.24 mmol/L — ABNORMAL LOW (ref 0.60–1.20)

## 2022-10-05 LAB — TSH: TSH: 0.441 u[IU]/mL (ref 0.350–4.500)

## 2022-10-05 LAB — SARS CORONAVIRUS 2 BY RT PCR: SARS Coronavirus 2 by RT PCR: NEGATIVE

## 2022-10-05 MED ORDER — ACETAMINOPHEN 325 MG PO TABS: 650.0000 mg | ORAL_TABLET | Freq: Four times a day (QID) | ORAL | Status: DC | PRN

## 2022-10-05 MED ORDER — ALUM & MAG HYDROXIDE-SIMETH 200-200-20 MG/5ML PO SUSP: 30.0000 mL | ORAL | Status: DC | PRN

## 2022-10-05 MED ORDER — ZIPRASIDONE MESYLATE 20 MG IM SOLR: 20.0000 mg | INTRAMUSCULAR | Status: DC | PRN

## 2022-10-05 MED ORDER — MAGNESIUM HYDROXIDE 400 MG/5ML PO SUSP: 30.0000 mL | Freq: Every day | ORAL | Status: DC | PRN

## 2022-10-05 MED ORDER — OLANZAPINE 5 MG PO TBDP
5.0000 mg | ORAL_TABLET | Freq: Three times a day (TID) | ORAL | Status: DC | PRN
  Administered 2022-10-05: 5 mg via ORAL
  Filled 2022-10-05: qty 1

## 2022-10-05 MED ORDER — LITHIUM CARBONATE 300 MG PO CAPS
300.0000 mg | ORAL_CAPSULE | Freq: Two times a day (BID) | ORAL | Status: DC
  Administered 2022-10-05 (×2): 300 mg via ORAL
  Filled 2022-10-05 (×2): qty 1

## 2022-10-05 MED ORDER — OLANZAPINE 10 MG PO TABS
20.0000 mg | ORAL_TABLET | Freq: Every day | ORAL | Status: DC
  Administered 2022-10-05: 20 mg via ORAL
  Filled 2022-10-05: qty 2

## 2022-10-05 MED ORDER — LORAZEPAM 1 MG PO TABS: 1.0000 mg | ORAL_TABLET | ORAL | Status: DC | PRN
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
## 2022-10-05 NOTE — ED Notes (Signed)
Report attempted to [facility], staff state [facility]a to follow up with RN.
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
## 2022-10-05 NOTE — ED Notes (Signed)
Pt A&O x 4, resting at present, no distress noted.  Snack given.  Pt calm & cooperative at present.  Monitoring for safety.  Pending [facility] admit and transfer.  Pt is IVC.

## 2022-10-05 NOTE — ED Notes (Addendum)
Patient became agitated earlier and started yelling and banging his fist against the desk outside of the observation window, stating he wanted to go home, his rights were being violated, and he didn't need to be here. Pt verbally redirected by NP Estill Bamberg and security, Risk analyst. Pt did take Zyprexa PRN at 1736 and has been quiet, watching TV. NP explained to pt he is under IVC but patient ignored her.

## 2022-10-05 NOTE — ED Notes (Signed)
Report called to RN Jane/[facility] rm [phone].  Pending [employer] [place].  Pt is IVC.

## 2022-10-05 NOTE — ED Notes (Signed)
Spoke with Herbert Seta, [facility].  Pt pending transfer around 12 midnight.  Pt is IVC.

## 2022-10-05 NOTE — Progress Notes (Signed)
Pt was accepted to [facility] 10/05/2022, pending labs, covid, and IVC paperwork faxed to [phone]. Bed assignment: [id number]  Pt meets inpatient criteria per Asencion Islam, NP  Attending Physician will be Janine Limbo, MD  Report can be called to: - Adult unit: [phone]  Night Shift AC to coordinate arrival time  Care Team Notified: [facility] Lynnda Shields, RN, Luanna Salk, RN, and Asencion Islam, NP  Denna Haggard, [place]  10/05/2022 3:07 PM
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
## 2022-10-06 ENCOUNTER — Encounter (HOSPITAL_COMMUNITY): Payer: Self-pay | Admitting: Behavioral Health

## 2022-10-06 ENCOUNTER — Inpatient Hospital Stay (HOSPITAL_COMMUNITY)
Admission: AD | Admit: 2022-10-06 | Discharge: 2022-10-10 | DRG: 885 | Disposition: A | Discharge status: Home / Self Care | Payer: 59 | Source: Intra-hospital | Attending: Psychiatry | Admitting: Psychiatry

## 2022-10-06 DIAGNOSIS — F411 Generalized anxiety disorder: Secondary | ICD-10-CM | POA: Diagnosis present

## 2022-10-06 DIAGNOSIS — F200 Paranoid schizophrenia: Secondary | ICD-10-CM | POA: Diagnosis present

## 2022-10-06 DIAGNOSIS — Z79899 Other long term (current) drug therapy: Secondary | ICD-10-CM

## 2022-10-06 DIAGNOSIS — F1021 Alcohol dependence, in remission: Secondary | ICD-10-CM | POA: Diagnosis present

## 2022-10-06 DIAGNOSIS — Z20822 Contact with and (suspected) exposure to covid-19: Secondary | ICD-10-CM | POA: Diagnosis present

## 2022-10-06 DIAGNOSIS — K30 Functional dyspepsia: Secondary | ICD-10-CM | POA: Diagnosis present

## 2022-10-06 DIAGNOSIS — K5900 Constipation, unspecified: Secondary | ICD-10-CM | POA: Diagnosis present

## 2022-10-06 DIAGNOSIS — F1221 Cannabis dependence, in remission: Secondary | ICD-10-CM | POA: Diagnosis present

## 2022-10-06 DIAGNOSIS — Z91148 Patient's other noncompliance with medication regimen for other reason: Secondary | ICD-10-CM | POA: Diagnosis not present

## 2022-10-06 DIAGNOSIS — F1591 Other stimulant use, unspecified, in remission: Secondary | ICD-10-CM | POA: Diagnosis not present

## 2022-10-06 DIAGNOSIS — G4700 Insomnia, unspecified: Secondary | ICD-10-CM | POA: Diagnosis present

## 2022-10-06 DIAGNOSIS — F17210 Nicotine dependence, cigarettes, uncomplicated: Secondary | ICD-10-CM | POA: Diagnosis present

## 2022-10-06 LAB — PROLACTIN: Prolactin: 17.4 ng/mL (ref 3.9–22.7)

## 2022-10-06 LAB — HEMOGLOBIN A1C
Hgb A1c MFr Bld: 5.6 % (ref 4.8–5.6)
Mean Plasma Glucose: 114 mg/dL

## 2022-10-06 MED ORDER — LORAZEPAM 2 MG/ML IJ SOLN: 2.0000 mg | Freq: Three times a day (TID) | INTRAMUSCULAR | Status: DC | PRN

## 2022-10-06 MED ORDER — OLANZAPINE 10 MG PO TABS
20.0000 mg | ORAL_TABLET | Freq: Every day | ORAL | Status: DC
  Administered 2022-10-06 – 2022-10-09 (×4): 20 mg via ORAL
  Filled 2022-10-06 (×7): qty 2

## 2022-10-06 MED ORDER — ACETAMINOPHEN 325 MG PO TABS: 650.0000 mg | ORAL_TABLET | Freq: Four times a day (QID) | ORAL | Status: DC | PRN

## 2022-10-06 MED ORDER — TRAZODONE HCL 50 MG PO TABS
50.0000 mg | ORAL_TABLET | Freq: Every evening | ORAL | Status: DC | PRN
  Administered 2022-10-06: 50 mg via ORAL
  Filled 2022-10-06: qty 1

## 2022-10-06 MED ORDER — HALOPERIDOL 5 MG PO TABS
5.0000 mg | ORAL_TABLET | Freq: Three times a day (TID) | ORAL | Status: DC | PRN
  Administered 2022-10-07: 5 mg via ORAL
  Filled 2022-10-06: qty 1

## 2022-10-06 MED ORDER — DIPHENHYDRAMINE HCL 50 MG/ML IJ SOLN: 50.0000 mg | Freq: Three times a day (TID) | INTRAMUSCULAR | Status: DC | PRN

## 2022-10-06 MED ORDER — LORAZEPAM 1 MG PO TABS
2.0000 mg | ORAL_TABLET | Freq: Three times a day (TID) | ORAL | Status: DC | PRN
  Administered 2022-10-07: 2 mg via ORAL
  Filled 2022-10-06: qty 2

## 2022-10-06 MED ORDER — HALOPERIDOL LACTATE 5 MG/ML IJ SOLN: 5.0000 mg | Freq: Three times a day (TID) | INTRAMUSCULAR | Status: DC | PRN

## 2022-10-06 MED ORDER — MAGNESIUM HYDROXIDE 400 MG/5ML PO SUSP: 30.0000 mL | Freq: Every day | ORAL | Status: DC | PRN

## 2022-10-06 MED ORDER — ALUM & MAG HYDROXIDE-SIMETH 200-200-20 MG/5ML PO SUSP: 30.0000 mL | ORAL | Status: DC | PRN

## 2022-10-06 MED ORDER — LITHIUM CARBONATE 300 MG PO CAPS
300.0000 mg | ORAL_CAPSULE | Freq: Two times a day (BID) | ORAL | Status: DC
  Administered 2022-10-06 – 2022-10-10 (×9): 300 mg via ORAL
  Filled 2022-10-06 (×14): qty 1

## 2022-10-06 MED ORDER — HYDROXYZINE HCL 25 MG PO TABS
25.0000 mg | ORAL_TABLET | Freq: Three times a day (TID) | ORAL | Status: DC | PRN
  Administered 2022-10-06 – 2022-10-07 (×3): 25 mg via ORAL
  Filled 2022-10-06 (×3): qty 1

## 2022-10-06 MED ORDER — DIPHENHYDRAMINE HCL 25 MG PO CAPS
50.0000 mg | ORAL_CAPSULE | Freq: Three times a day (TID) | ORAL | Status: DC | PRN
  Administered 2022-10-07: 50 mg via ORAL
  Filled 2022-10-06: qty 2

## 2022-10-06 NOTE — Progress Notes (Signed)
Recreation Therapy Notes  INPATIENT RECREATION THERAPY ASSESSMENT  Patient Details Name: Danta Chamber MRN: [id number] DOB: Aug 06, 1980 Today's Date: 10/06/2022       Information Obtained From: Patient  Able to Participate in Assessment/Interview: Yes  Patient Presentation: Alert  Reason for Admission (Per Patient): Other (Comments) ("Agricultural engineer")  Patient Stressors: Other (Comment) ("the thought of not leaving here")  Coping Skills:   Isolation, Journal, TV, Sports, Arguments, Aggression, Music, Exercise, Deep Breathing, Meditate, Substance Abuse, Talk, Art, Prayer, Avoidance, Read, Hot Bath/Shower  Leisure Interests (2+):  Music - Listen, Social - Family, Individual - Other (Comment) (Smoke; Plan family vacations; Think about family)  Frequency of Recreation/Participation: Monthly  Awareness of Community Resources:  Yes  Community Resources:  Patent examiner, Engineer, drilling, Tax inspector, Other (Comment) Environmental consultant; Various Stores)  Current Use: Yes  If no, Barriers?:    Expressed Interest in [employer]: No  [employer] of Residence:  Investment banker, corporate  Patient Main Form of Transportation: Musician (Walk)  Patient Strengths:  Sports, Cytogeneticist, Good Behavior, Happy, Find something to do/action packed, Active  Patient Identified Areas of Improvement:  "talk therapy" (talking to people who he has problems with without raising his voice), Money management, Focus on wife/daughter  Patient Goal for Hospitalization:  "stay learning, active until I get out"  Current SI (including self-harm):  No  Current HI:  No  Current AVH: No  Staff Intervention Plan: Group Attendance, Collaborate with Interdisciplinary Treatment Team  Consent to Intern Participation: N/A   Addelyn Alleman-McCall, LRT,CTRS Sherryann Frese A Crystalee Ventress-McCall 10/06/2022, 2:10 PM

## 2022-10-06 NOTE — Progress Notes (Signed)
D- Patient alert and oriented. Affect flat. Mood depressed. Denies SI, HI, AVH, and pain. Speech is soft and makes minimal eye contact. A- Scheduled medications administered to patient, per MD orders. Support and encouragement provided.  Routine safety checks conducted every 15 minutes.  Patient informed to notify staff with problems or concerns. R- No adverse drug reactions noted. Patient contracts for safety at this time. Patient compliant with medications and treatment plan. Patient receptive, calm, and cooperative. Patient interacts well with others on the unit.  Patient remains safe at this time.

## 2022-10-06 NOTE — Group Note (Signed)
Date:  10/06/2022 Time:  11:30 AM  Group Topic/Focus:  Goals Group:   The focus of this group is to help patients establish daily goals to achieve during treatment and discuss how the patient can incorporate goal setting into their daily lives to aide in recovery. Orientation:   The focus of this group is to educate the patient on the purpose and policies of crisis stabilization and provide a format to answer questions about their admission.  The group details unit policies and expectations of patients while admitted.    Participation Level:  Active  Participation Quality:  Appropriate  Affect:  Appropriate  Cognitive:  Appropriate  Insight: Appropriate  Engagement in Group:  Engaged  Modes of Intervention:  Confrontation  Additional Comments:  Patient attended morning orientation/goal group and said that his goal for today is to go home.   Vaneta Hammontree W Shey Bartmess [phone], 11:30 AM

## 2022-10-06 NOTE — Group Note (Signed)
Date:  10/06/2022 Time:  9:31 PM  Group Topic/Focus:  Wrap-Up Group:   The focus of this group is to help patients review their daily goal of treatment and discuss progress on daily workbooks.    Participation Level:  Active  Participation Quality:  Appropriate  Affect:  Appropriate  Cognitive:  Appropriate  Insight: Appropriate  Engagement in Group:  Engaged  Modes of Intervention:  Education and Exploration  Additional Comments:  Patient attended and participated in group tonight. He reports that his goal today was to go to group and to be discharge. He went to group today. He rate his day a [address]  [name] [name] 10/06/2022, 9:31 PM

## 2022-10-06 NOTE — Group Note (Signed)
Date:  10/06/2022 Time:  4:43 PM  Group Topic/Focus:  Wellness Toolbox:   The focus of this group is to discuss various aspects of wellness, balancing those aspects and exploring ways to increase the ability to experience wellness.  Patients will create a wellness toolbox for use upon discharge.    Participation Level:  Active  Participation Quality:  Appropriate  Affect:  Appropriate  Cognitive:  Appropriate  Insight: Appropriate  Engagement in Group:  Engaged  Modes of Intervention:  Discussion  Additional Comments:    Garvin Fila 10/06/2022, 4:43 PM
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
## 2022-10-06 NOTE — Tx Team (Signed)
Initial Treatment Plan 10/06/2022 2:46 AM [name] [id number]    PATIENT STRESSORS: Financial difficulties   Marital or family conflict   Medication change or noncompliance   Occupational concerns   Substance abuse     PATIENT STRENGTHS: Supportive family/friends  Work skills    PATIENT IDENTIFIED PROBLEMS: Depression  Substance  " Housing"                 DISCHARGE CRITERIA:  Adequate post-discharge living arrangements Improved stabilization in mood, thinking, and/or behavior Medical problems require only outpatient monitoring Verbal commitment to aftercare and medication compliance  PRELIMINARY DISCHARGE PLAN: Attend aftercare/continuing care group Attend PHP/IOP Outpatient therapy Placement in alternative living arrangements Return to previous work or school arrangements  PATIENT/FAMILY INVOLVEMENT: This treatment plan has been presented to and reviewed with the patient, [name] [name], and/or family member.  The patient and family have been given the opportunity to ask questions and make suggestions.  Wolfgang Phoenix, RN 10/06/2022, 2:46 AM

## 2022-10-06 NOTE — Group Note (Signed)
Recreation Therapy Group Note   Group Topic:Self-Esteem  Group Date: 10/06/2022 Start Time: [id number] End Time: 1116 Facilitators: Geoffery Aultman-McCall, LRT,CTRS Location: [address] Day[address]   Goal Area(s) Addresses:   Patient will successfully identify things that describe them and are important to them.  Patient will successfully express the importance of self esteem.   Group Description: Patient asked to create a personalized collage on their construction paper color of choice. Patients were told to make the collage relative to things that represent them, things they like and things that are important to them. Patients were given multiple craft materials to complete this activity. Patients shared their collages when they completed.   Affect/Mood: Appropriate   Participation Level: Engaged   Participation Quality: Independent   Behavior: Appropriate   Speech/Thought Process: Focused   Insight: Good   Judgement: Good   Modes of Intervention: Art   Patient Response to Interventions:  Engaged   Education Outcome:  Acknowledges education and In group clarification offered    Clinical Observations/Individualized Feedback: Pt was quiet but engaged.  Pt was focused on completing the collage.  Pt identified sports cars, food, animals and cartoons as things he loves.  Pt was appropriate throughout group session.  Pt was also interactive with peers.     Plan: Continue to engage patient in RT group sessions 2-3x/week.   Fayetta Sorenson-McCall, LRT,CTRS  10/06/2022 1:09 PM
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
## 2022-10-06 NOTE — ED Notes (Signed)
[facility] Transport requested to [facility].  Pt is IVC.

## 2022-10-06 NOTE — [facility] Counselor (Signed)
Adult Comprehensive Assessment  Patient ID: [name] [name], male   DOB: [birth year], 42 y.o.   MRN: [id number]  Information Source: Information source: Patient  Current Stressors:  Patient states their primary concerns and needs for treatment are:: Patient states he does not know why they brought him here, he states that the police showed up and he was brought here Patient states their goals for this hospitilization and ongoing recovery are:: patient states he would like to be discharged Educational / Learning stressors: No stressors Employment / Job issues: Patient works at [employer]. no stressors noted Family Relationships: patient states he is working on relationship with mother and brother.  He moved into house with mother and brother after seperating from wife Museum/gallery curator / Lack of resources (include bankruptcy): patient states he receives [employer] / Lack of housing: Patient working through conflict with brother and mother while living with them Physical health (include injuries & life threatening diseases): no stressors Social relationships: limited social support Substance abuse: patient states that he smokes cigarettes which Bereavement / Loss: Patient states that he seperated from wife and his daughter moved out  Living/Environment/Situation:  Living Arrangements: Parent, Other relatives Living conditions (as described by patient or guardian): patient states the environment covers basic needs Who else lives in the home?: mother and brother How long has patient lived in current situation?: 2018 What is atmosphere in current home: Comfortable, Supportive  Family History:  Marital status: Separated Separated, when?: 2018 What types of issues is patient dealing with in the relationship?: patient states there are some trust issues Additional relationship information: "I only want to be married once. I want to stay with her forever". Are you sexually active?: No What is  your sexual orientation?: Straight Has your sexual activity been affected by drugs, alcohol, medication, or emotional stress?: No Does patient have children?: Yes How many children?: 1 How is patient's relationship with their children?: patient states he has regular contact  Childhood History:  By whom was/is the patient raised?: Both parents Additional childhood history information: Came to [place] from [place] with both of his parents at age 17yo.  His parents are [place]; had to flee their country because his father worked with the [employer]. Description of patient's relationship with caregiver when they were a child: "It was good, nothing was wrong." How were you disciplined when you got in trouble as a child/adolescent?: Appropriate ways, grounded, told to go to room Does patient have siblings?: Yes Number of Siblings: 4 Description of patient's current relationship with siblings: patient has good relationship with 3 sisters.  Patient states that brother can be a bully Did patient suffer any verbal/emotional/physical/sexual abuse as a child?: No Did patient suffer from severe childhood neglect?: No Has patient ever been sexually abused/assaulted/raped as an adolescent or adult?: No Was the patient ever a victim of a crime or a disaster?: No Witnessed domestic violence?: No Has patient been affected by domestic violence as an adult?: No  Education:  Highest grade of school patient has completed: GED Currently a Ship broker?: No Learning disability?: No  Employment/Work Situation:   Employment Situation: On disability Why is Patient on Disability: MENTAL HEALTH How Long has Patient Been on Disability: UNKNOWN Patient's Job has Been Impacted by Current Illness: No What is the Longest Time Patient has Held a Job?: been employed since 2012 Where was the Patient Employed at that Time?: Restaurant, waiting tables, [employer] Has Patient ever Been in the [employer]?:  No  Financial Resources:  Financial resources: Receives SSI Does patient have a Programmer, applications or guardian?: No  Alcohol/Substance Abuse:   What has been your use of drugs/alcohol within the last 12 months?: alcohol, "cigarettes" If attempted suicide, did drugs/alcohol play a role in this?: No Alcohol/Substance Abuse Treatment Hx: Denies past history Has alcohol/substance abuse ever caused legal problems?: Yes (DUI)  Social Support System:   Patient's Community Support System: Fair Astronomer System: mother, [facility] Type of faith/religion: none How does patient's faith help to cope with current illness?: none  Leisure/Recreation:   Do You Have Hobbies?: Yes Leisure and Hobbies: Swimming, playing basketball, going out to eat  Strengths/Needs:   What is the patient's perception of their strengths?: patient states he is good at drawing, exercising Patient states they can use these personal strengths during their treatment to contribute to their recovery: no Patient states these barriers may affect/interfere with their treatment: none Patient states these barriers may affect their return to the community: none Other important information patient would like considered in planning for their treatment: none  Discharge Plan:   Currently receiving community mental health services: Yes (From Whom) ([facility]) Patient states concerns and preferences for aftercare planning are: none Patient states they will know when they are safe and ready for discharge when: patient states that he is ready Does patient have access to transportation?: Yes (Mother) Does patient have financial barriers related to discharge medications?: No Will patient be returning to same living situation after discharge?: Yes  Summary/Recommendations:   Summary and Recommendations (to be completed by the evaluator): [name] [name] is a 42 year old male who has a past psychiatric history of  schizophrenia.  Patient states that he has a substance history and his brother and him sometimes "get into it." He currently lives with his mother and brother and working on his relationship with them.  Patient reports that he is seperated from his wife and has been seperated since 2018.  He has a 44 year old daughter whom he sees frequently.  Patient is connected with [facility].  He states no additional concerns and states he is ready for discharge. While here, [place] can benefit from crisis stabilization, medication management, therapeutic milieu, and referrals for services.   Korbyn Chopin E Saturnino Liew. 10/06/2022

## 2022-10-07 ENCOUNTER — Encounter (HOSPITAL_COMMUNITY): Payer: Self-pay

## 2022-10-07 DIAGNOSIS — F200 Paranoid schizophrenia: Principal | ICD-10-CM

## 2022-10-07 LAB — URINALYSIS, ROUTINE W REFLEX MICROSCOPIC
Bilirubin Urine: NEGATIVE
Glucose, UA: NEGATIVE mg/dL
Hgb urine dipstick: NEGATIVE
Ketones, ur: NEGATIVE mg/dL
Leukocytes,Ua: NEGATIVE
Nitrite: NEGATIVE
Protein, ur: NEGATIVE mg/dL
Specific Gravity, Urine: 1.01 (ref 1.005–1.030)
pH: 6 (ref 5.0–8.0)

## 2022-10-07 LAB — VITAMIN D 25 HYDROXY (VIT D DEFICIENCY, FRACTURES): Vit D, 25-Hydroxy: 11 ng/mL — ABNORMAL LOW (ref 30–100)

## 2022-10-07 MED ORDER — TRAZODONE HCL 50 MG PO TABS
50.0000 mg | ORAL_TABLET | Freq: Every day | ORAL | Status: DC
  Administered 2022-10-07 – 2022-10-09 (×3): 50 mg via ORAL
  Filled 2022-10-07 (×5): qty 1

## 2022-10-07 MED ORDER — VITAMIN D (ERGOCALCIFEROL) 1.25 MG (50000 UNIT) PO CAPS
50000.0000 [IU] | ORAL_CAPSULE | ORAL | Status: DC
  Administered 2022-10-08: 50000 [IU] via ORAL
  Filled 2022-10-07: qty 1

## 2022-10-07 NOTE — Group Note (Signed)
Date:  10/07/2022 Time:  5:02 PM  Group Topic/Focus:  Developing a Wellness Toolbox:   The focus of this group is to help patients develop a "wellness toolbox" with skills and strategies to promote recovery upon discharge.    Participation Level:  Active  Participation Quality:  Appropriate  Affect:  Appropriate  Cognitive:  Appropriate  Insight: Appropriate  Engagement in Group:  Engaged  Modes of Intervention:  Discussion  Additional Comments:     Jerrye Beavers 10/07/2022, 5:02 PM

## 2022-10-07 NOTE — Group Note (Signed)
Date:  10/07/2022 Time:  11:05 AM  Group Topic/Focus:  Orientation:   The focus of this group is to educate the patient on the purpose and policies of crisis stabilization and provide a format to answer questions about their admission.  The group details unit policies and expectations of patients while admitted.    Participation Level:  Active  Participation Quality:  Inattentive  Affect:  Not Congruent  Cognitive:  Lacking  Insight: Lacking  Engagement in Group:  Poor  Modes of Intervention:  Discussion  Additional Comments:     Jerrye Beavers 10/07/2022, 11:05 AM

## 2022-10-07 NOTE — Progress Notes (Addendum)
   10/06/22 2043  Psych Admission Type (Psych Patients Only)  Admission Status Involuntary  Psychosocial Assessment  Patient Complaints Anxiety;Depression  Eye Contact Fair  Facial Expression Flat  Affect Depressed  Speech Soft  Interaction Assertive  Motor Activity Slow  Appearance/Hygiene Improved  Behavior Characteristics Cooperative;Appropriate to situation  Mood Depressed;Anxious  Thought Process  Coherency WDL  Content WDL  Delusions None reported or observed  Perception WDL  Hallucination None reported or observed  Judgment Impaired  Confusion None  Danger to Self  Current suicidal ideation? Denies  Agreement Not to Harm Self Yes  Description of Agreement verbal  Danger to Others  Danger to Others None reported or observed   On assessment, patient is observed at the medication window with moderate anxiety and depression.  Patient reports, "I'm just tired."  Patient denies SI/HI/AVH and contracts for safety.  Administered PRN Hydroxyzine and Trazodone per [facility] per patient request.  Patient is safe on the unit with q15 minute safety checks.

## 2022-10-07 NOTE — Progress Notes (Signed)
[facility] MD Progress Note  10/07/2022 4:20 PM [name] [name]  MRN:  [id number] Principal Problem: Paraphrenic schizophrenia Diagnosis: Principal Problem:   Paraphrenic schizophrenia Active Problems:   History of methamphetamine use   Insomnia   Anxiety state  Reason for admission: [name] [name] is a 42 yo male with a history of p. schizophrenia who was taken to the [facility] health urgent care by his community assertive treatment team for worsening inability to tend to personal hygiene needs, poor sleep, restlessness, decreased appetite with weight loss, auditory hallucinations & aggressive behaviors towards his family members in the context of medication non compliance x several months.  Patient was involuntarily committed by his ACTT provider, prior to transportation to this [facility] for treatment and stabilization of his mental status.   24 hr chart review: BP slightly elevated earlier today morning at 140/92. HR WNL.  Patient remains compliant with scheduled medications, required agitation protocol medications at 1 AM this morning, was given Haldol 5 mg, Benadryl 50 mg, and Ativan 2 mg p.o.  Nursing documentation does not reflect behaviors that warranted these medications, but mentions that patient was complaining of feeling anxious and being agitated.  Patient assessment note, 10/07/2022: On assessment today, patient was drowsy, and sleepy, most likely from agitation protocol medications given to him earlier today morning at 1 AM.  He was however, easy to arouse, and was able to engage in conversations.  Patient denied SI/HI/AVH, denies paranoia at time of assessment, stated that the last time that he heard voices was yesterday.  He states that at that time, he heard the voice of God saying: "I got you. Don't worry."   Patient reports a good sleep quality last night, even though nursing documentation reflects otherwise, he reports a good appetite, denies being in any physical pain, reports  that he is tolerating current medications well, with no medication related side effects.  We will continue olanzapine 20 mg nightly, and will schedule trazodone 50 mg nightly for sleep. We will continue other medications as listed below.  Total Time spent with patient: 45 minutes  Past Psychiatric History: See H & P  Past Medical History:  Past Medical History:  Diagnosis Date   Anxiety    Depression    Mental disorder    Schizophrenia    History reviewed. No pertinent surgical history. Family History:  Family History  Problem Relation Age of Onset   Mental illness Neg Hx    Family Psychiatric  History: See H & P 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, Pt uses meth    Social History   Socioeconomic History   Marital status: Married    Spouse name: Not on file   Number of children: 1   Years of education: Not on file   Highest education level: Not on file  Occupational History   Occupation: Disabled  Tobacco Use   Smoking status: Every Day    Packs/day: 1    Types: Cigarettes   Smokeless tobacco: Former  Scientific laboratory technician Use: Unknown  Substance and Sexual Activity   Alcohol use: Not Currently    Comment: haven't drank in months"   Drug use: Yes    Types: Marijuana, Methamphetamines    Comment:  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 Pt lives with mother who IVC'd him.      Pt lives with parents and sister.  He is married, but currently separated.  Pt is on disability.   Social Determinants of Health   Financial Resource Strain: Not on file  Food Insecurity: Not on file   Transportation Needs: Not on file  Physical Activity: Not on file  Stress: Not on file  Social Connections: Not on file   Sleep: Fair  Appetite:  Good  Current Medications: Current Facility-Administered Medications  Medication Dose Route Frequency Provider Last Rate Last Admin   acetaminophen (TYLENOL) tablet 650 mg  650 mg Oral Q6H PRN Hezzie Bump, NP       alum & mag hydroxide-simeth (MAALOX/MYLANTA) 200-200-20 MG/5ML suspension 30 mL  30 mL Oral Q4H PRN Hezzie Bump, NP       diphenhydrAMINE (BENADRYL) capsule 50 mg  50 mg Oral TID PRN Hezzie Bump, NP   50 mg at 10/07/22 0114   Or   diphenhydrAMINE (BENADRYL) injection 50 mg  50 mg Intramuscular TID PRN Hezzie Bump, NP       haloperidol (HALDOL) tablet 5 mg  5 mg Oral TID PRN Hezzie Bump, NP   5 mg at 10/07/22 0114   Or   haloperidol lactate (HALDOL) injection 5 mg  5 mg Intramuscular TID PRN Hezzie Bump, NP       hydrOXYzine (ATARAX) tablet 25 mg  25 mg Oral TID PRN [name], NP   25 mg at 10/07/22 1322   lithium carbonate capsule 300 mg  300 mg Oral BID Hezzie Bump, NP   300 mg at 10/07/22 [id number]   LORazepam (ATIVAN) tablet 2 mg  2 mg Oral TID PRN Hezzie Bump, NP   2 mg at 10/07/22 0114   Or   LORazepam (ATIVAN) injection 2 mg  2 mg Intramuscular TID PRN Hezzie Bump, NP       magnesium hydroxide (MILK OF MAGNESIA) suspension 30 mL  30 mL Oral Daily PRN Hezzie Bump, NP       OLANZapine (ZYPREXA) tablet 20 mg  20 mg Oral QHS Hezzie Bump, NP   20 mg at 10/06/22 2043   traZODone (DESYREL) tablet 50 mg  50 mg Oral QHS [name], NP       [START ON 10/08/2022] Vitamin D (Ergocalciferol) (DRISDOL) 1.25 MG (50000 UNIT) capsule 50,000 Units  50,000 Units Oral Q7 days [name], NP        Lab Results:  Results for orders placed or performed during the hospital encounter of 10/06/22 (from the past 48 hour(s))  Urinalysis, Routine w reflex microscopic -Urine,  Clean Catch     Status: Abnormal   Collection Time: 10/06/22  7:41 PM  Result Value Ref Range   Color, Urine STRAW (A) YELLOW   APPearance CLEAR CLEAR   Specific Gravity, Urine 1.010 1.005 - 1.030   pH 6.0 5.0 - 8.0   Glucose, UA NEGATIVE NEGATIVE mg/dL   Hgb urine dipstick NEGATIVE NEGATIVE   Bilirubin Urine NEGATIVE NEGATIVE   Ketones, ur NEGATIVE NEGATIVE mg/dL   Protein, ur NEGATIVE NEGATIVE mg/dL   Nitrite NEGATIVE NEGATIVE   Leukocytes,Ua NEGATIVE NEGATIVE    Comment: Performed at  [facility], [place] [address], [place], [place] [address]  VITAMIN D 25 Hydroxy (Vit-D Deficiency, Fractures)     Status: Abnormal   Collection Time: 10/07/22  6:30 AM  Result Value Ref Range   Vit D, 25-Hydroxy 11.00 (L) 30 - 100 ng/mL    Comment: (NOTE) Vitamin D deficiency has been defined by the Institute of Medicine  and an Endocrine Society practice guideline as a level of serum 25-OH  vitamin D less than 20 ng/mL (1,2). The Endocrine Society went on to  further define vitamin D insufficiency as a level between 21 and 29  ng/mL (2).  1. IOM (Institute of Medicine). 2010. Dietary reference intakes for  calcium and D. Twin: The [employer]. 2. Holick MF, Binkley Tightwad, Bischoff-Ferrari HA, et al. Evaluation,  treatment, and prevention of vitamin D deficiency: an Endocrine  Society clinical practice guideline, JCEM. 2011 Jul; 96(7): 1911-30.  Performed at [facility], [place] [address] [place], [place] [address]     Blood Alcohol level:  Lab Results  Component Value Date   [facility] <10 10/05/2022   ETH <10 [id number]    Metabolic Disorder Labs: Lab Results  Component Value Date   HGBA1C 5.6 10/05/2022   MPG 114 10/05/2022   MPG 108.28 11/21/2020   Lab Results  Component Value Date   PROLACTIN 17.4 10/05/2022   PROLACTIN 45.8 (H) 12/19/2017   Lab Results  Component Value Date   CHOL 156 10/05/2022   TRIG 116 10/05/2022   HDL 40 (L)  10/05/2022   CHOLHDL 3.9 10/05/2022   VLDL 23 10/05/2022   LDLCALC 93 10/05/2022   LDLCALC 69 11/21/2020    Physical Findings: AIMS:  , ,  ,  ,    CIWA:    COWS:     Musculoskeletal: Strength & Muscle Tone: within normal limits Gait & Station: normal Patient leans: N/A  Psychiatric Specialty Exam:  Presentation  General Appearance:  Appropriate for Environment; Fairly Groomed  Eye Contact: Fair  Speech: Clear and Coherent  Speech Volume: Normal  Handedness: Right   Mood and Affect  Mood: Depressed  Affect: Congruent   Thought Process  Thought Processes: Coherent  Descriptions of Associations:Intact  Orientation:Full (Time, Place and Person)  Thought Content:Logical  History of Schizophrenia/Schizoaffective disorder:Yes  Duration of Psychotic Symptoms:Greater than six months  Hallucinations:Hallucinations: None  Ideas of Reference:None  Suicidal Thoughts:Suicidal Thoughts: No  Homicidal Thoughts:Homicidal Thoughts: No   Sensorium  Memory: Immediate Good  Judgment: Fair  Insight: Fair   Materials engineer: Fair  Attention Span: Fair  Recall: [employer] of Knowledge: Fair  Language: Fair   Psychomotor Activity  Psychomotor Activity: Psychomotor Activity: Normal   Assets  Assets: Communication Skills; Social Support; Resilience   Sleep  Sleep: Sleep: Fair   Physical Exam: Physical Exam Constitutional:      Appearance: Normal appearance.  HENT:     Head: Normocephalic.     Nose: Nose normal.  Eyes:     Pupils: Pupils are equal, round, and reactive to light.  Pulmonary:     Effort: Pulmonary effort is normal.  Musculoskeletal:        General: Normal range of motion.     Cervical back: Normal range of motion.  Neurological:     Mental Status: He is alert and oriented to person, place, and time.  Psychiatric:        Behavior: Behavior normal.    Review of Systems  Constitutional:   Negative for fever.  HENT:  Negative for hearing loss.   Eyes:  Negative for blurred vision.  Respiratory: Negative.    Cardiovascular: Negative.   Gastrointestinal:  Negative for heartburn.  Genitourinary:  Negative for dysuria.  Musculoskeletal:  Negative for myalgias.  Skin: Negative.   Neurological:  Negative for dizziness.  Psychiatric/Behavioral:  Positive for depression and hallucinations. Negative for memory loss, substance abuse and suicidal ideas. The patient is nervous/anxious and has insomnia.    Blood pressure (!) 140/92, pulse 82, temperature 98.8 F (37.1 C), temperature source Oral, resp. rate 18, height 5\' 3"  (1.6 m), weight 68 kg, SpO2 96 %. Body mass index is 26.57 kg/m.  Treatment Plan Summary: Daily contact with patient to assess and evaluate symptoms and progress in treatment and Medication management   Observation Level/Precautions:  15 minute checks  Laboratory:  Labs reviewed: Lipid panel WNL, lithium level subtherapeutic at 0.24, CMP WNL, CBC mostly WNL.  Labs ordered: Vitamin D low at 11. Will order Vitamin D 50.000 units weekly., lithium level to be drawn on 4/5, hemoglobin A1c, Baseline UA. EKG completed on 04/01 with QTC slightly elevated at 468  Psychotherapy:  Unit Group sessions  Medications:  See [facility]  Consultations:  To be determined   Discharge Concerns:  Safety, medication compliance, mood stability  Estimated LOS: 5-7 days  Other:  N/A    PLAN Safety and Monitoring: Voluntary admission to inpatient psychiatric unit for safety, stabilization and treatment Daily contact with patient to assess and evaluate symptoms and progress in treatment Patient's case to be discussed in multi-disciplinary team meeting Observation Level : q15 minute checks Vital signs: q12 hours Precautions: Safety   Long Term Goal(s): Improvement in symptoms so as ready for discharge   Short Term Goals: Ability to identify changes in lifestyle to reduce recurrence of  condition will improve, Ability to verbalize feelings will improve, Ability to disclose and discuss suicidal ideas, Ability to demonstrate self-control will improve, Ability to identify and develop effective coping behaviors will improve, and Compliance with prescribed medications will improve   Diagnoses Principal Problem:   Paraphrenic schizophrenia Active Problems:   History of methamphetamine use   Insomnia   Anxiety state   Medications -Continue olanzapine 20 mg nightly for psychosis/mood stabilization (Home medication) -Continue lithium 300 mg twice daily for mood stabilization (home medication)-will complete Level on 10/09/2022 -Continue hydroxyzine 25 mg as needed 3 times daily for anxiety -Schedule trazodone 50 mg nightly for sleep -Start Vitamin D 50,000 units weekly for Vitamin D deficiency -Continue agitation protocol medications: Haldol 5 mg/Ativan 2 mg/Benadryl 50 mg p.o. or IM as needed 3 times daily for agitation   Other PRNS -Continue Tylenol 650 mg every 6 hours PRN for mild pain -Continue Maalox 30 mg every 4 hrs PRN for indigestion -Continue Milk of Magnesia as needed every 6 hrs for constipation   Discharge Planning: Social work and case management to assist with discharge planning and identification of hospital follow-up needs prior to discharge Estimated LOS: 5-7 days Discharge Concerns: Need to establish a safety plan; Medication compliance and effectiveness Discharge Goals: Return home with outpatient referrals for mental health follow-up including medication management/psychotherapy   I certify that inpatient services furnished can reasonably be expected to improve the patient's condition.      [name], NP 10/07/2022, 4:20 PM

## 2022-10-07 NOTE — Progress Notes (Signed)
   10/07/22 0555  15 Minute Checks  Location Bedroom  Visual Appearance Calm  Behavior Sleeping  Sleep (Behavioral Health Patients Only)  Calculate sleep? (Click Yes once per 24 hr at 0600 safety check) Yes  Documented sleep last 24 hours 8.25

## 2022-10-07 NOTE — Progress Notes (Signed)
   10/07/22 2100  Psych Admission Type (Psych Patients Only)  Admission Status Involuntary  Psychosocial Assessment  Patient Complaints Anxiety  Eye Contact Fair  Facial Expression Flat  Affect Anxious  Speech Soft  Interaction Assertive  Motor Activity Slow  Appearance/Hygiene Unremarkable  Behavior Characteristics Cooperative;Appropriate to situation  Mood Depressed;Anxious  Thought Process  Coherency WDL  Content WDL  Delusions WDL  Perception WDL  Hallucination None reported or observed  Judgment WDL  Confusion Severe  Danger to Self  Current suicidal ideation? Denies  Agreement Not to Harm Self Yes  Description of Agreement VERBAL  Danger to Others  Danger to Others None reported or observed   Pt compliant with medication denies SI/HI/A/VH and verbally contracted for safety no adverse effects noted. Support and encouragement provided.

## 2022-10-07 NOTE — Group Note (Signed)
Date:  10/07/2022 Time:  4:57 PM  Group Topic/Focus:  Orientation:   The focus of this group is to educate the patient on the purpose and policies of crisis stabilization and provide a format to answer questions about their admission.  The group details unit policies and expectations of patients while admitted.    Participation Level:  Active  Participation Quality:  Appropriate  Affect:  Appropriate  Cognitive:  Appropriate  Insight: Appropriate  Engagement in Group:  Engaged  Modes of Intervention:  Discussion  Additional Comments:     Jerrye Beavers 10/07/2022, 4:57 PM

## 2022-10-07 NOTE — Group Note (Signed)
Recreation Therapy Group Note   Group Topic:Communication  Group Date: 10/07/2022 Start Time: [id number] End Time: 1025 Facilitators: Armin Yerger-McCall, LRT,CTRS Location: [address] Dayroom   Goal Area(s) Addresses:  Patient will effectively listen to complete activity.  Patient will identify communication skills used to make activity successful.  Patient will identify how skills used during activity can be used to reach post d/c goals.    Group Description: Geometric Drawings.  Three volunteers from the peer group will be shown an abstract picture with a particular arrangement of geometrical shapes.  Each round, one 'speaker' will describe the pattern, as accurately as possible without revealing the image to the group.  The remaining group members will listen and draw the picture to reflect how it is described to them. Patients with the role of 'listener' cannot ask clarifying questions but, may request that the speaker repeat a direction. Once the drawings are complete, the presenter will show the rest of the group the picture and compare how close each person came to drawing the picture. LRT will facilitate a post-activity discussion regarding effective communication and the importance of planning, listening, and asking for clarification in daily interactions with others.   Affect/Mood: Drowsy   Participation Level: Active   Participation Quality: Independent   Behavior: Appropriate   Speech/Thought Process: Incoherent   Insight: Fair   Judgement: Fair    Modes of Intervention: Activity   Patient Response to Interventions:  Engaged   Education Outcome:  Acknowledges education and In group clarification offered    Clinical Observations/Individualized Feedback: Pt appeared drowsy at the beginning of group but became more alert as group went on.  Pt was the first presenter.  Pt directions were hard to follow.  Pt would stumble over his words and sounded a little pressured.  Pt  was somewhat engaged when peers presented their drawings to the rest of the group.     Plan: Continue to engage patient in RT group sessions 2-3x/week.   Darus Hershman-McCall, LRT,CTRS 10/07/2022 11:43 AM

## 2022-10-07 NOTE — BH IP Treatment Plan (Signed)
Interdisciplinary Treatment and Diagnostic Plan Update  10/07/2022 Time of Session: 11:05 AM  [name] [name] MRN: [id number]  Principal Diagnosis: Paraphrenic schizophrenia  Secondary Diagnoses: Principal Problem:   Paraphrenic schizophrenia Active Problems:   History of methamphetamine use   Insomnia   Anxiety state   Current Medications:  Current Facility-Administered Medications  Medication Dose Route Frequency Provider Last Rate Last Admin   acetaminophen (TYLENOL) tablet 650 mg  650 mg Oral Q6H PRN Hezzie Bump, NP       alum & mag hydroxide-simeth (MAALOX/MYLANTA) 200-200-20 MG/5ML suspension 30 mL  30 mL Oral Q4H PRN Hezzie Bump, NP       diphenhydrAMINE (BENADRYL) capsule 50 mg  50 mg Oral TID PRN Hezzie Bump, NP   50 mg at 10/07/22 0114   Or   diphenhydrAMINE (BENADRYL) injection 50 mg  50 mg Intramuscular TID PRN Hezzie Bump, NP       haloperidol (HALDOL) tablet 5 mg  5 mg Oral TID PRN Hezzie Bump, NP   5 mg at 10/07/22 0114   Or   haloperidol lactate (HALDOL) injection 5 mg  5 mg Intramuscular TID PRN Hezzie Bump, NP       hydrOXYzine (ATARAX) tablet 25 mg  25 mg Oral TID PRN [name], NP   25 mg at 10/06/22 2043   lithium carbonate capsule 300 mg  300 mg Oral BID Hezzie Bump, NP   300 mg at 10/07/22 [id number]   LORazepam (ATIVAN) tablet 2 mg  2 mg Oral TID PRN Hezzie Bump, NP   2 mg at 10/07/22 0114   Or   LORazepam (ATIVAN) injection 2 mg  2 mg Intramuscular TID PRN Hezzie Bump, NP       magnesium hydroxide (MILK OF MAGNESIA) suspension 30 mL  30 mL Oral Daily PRN Hezzie Bump, NP       OLANZapine (ZYPREXA) tablet 20 mg  20 mg Oral QHS Hezzie Bump, NP   20 mg at 10/06/22 2043   traZODone (DESYREL) tablet 50 mg  50 mg Oral QHS PRN [name], NP   50 mg at 10/06/22 2043   PTA Medications: Medications Prior to Admission  Medication Sig Dispense Refill Last Dose   lithium 300 MG tablet Take 600 mg by mouth  at bedtime.      Paliperidone Palmitate ER (INVEGA TRINZA) 546 MG/1.75ML injection Inject 546 mg into the muscle every 30 (thirty) days.      ZYPREXA 20 MG tablet Take 20 mg by mouth daily.       Patient Stressors: Financial difficulties   Marital or family conflict   Medication change or noncompliance   Occupational concerns   Substance abuse    Patient Strengths: Supportive family/friends  Work skills   Treatment Modalities: Medication Management, Group therapy, Case management,  1 to 1 session with clinician, Psychoeducation, Recreational therapy.   Physician Treatment Plan for Primary Diagnosis: Paraphrenic schizophrenia Long Term Goal(s): Improvement in symptoms so as ready for discharge   Short Term Goals: Ability to identify changes in lifestyle to reduce recurrence of condition will improve Ability to verbalize feelings will improve Ability to disclose and discuss suicidal ideas Ability to demonstrate self-control will improve Ability to identify and develop effective coping behaviors will improve Compliance with prescribed medications will improve  Medication Management: Evaluate patient's response, side effects, and tolerance of medication regimen.  Therapeutic Interventions: 1 to 1 sessions, Unit Group sessions and Medication administration.  Evaluation of Outcomes: Not Progressing  Physician Treatment Plan for Secondary Diagnosis: Principal Problem:   Paraphrenic schizophrenia Active Problems:   History of methamphetamine use   Insomnia   Anxiety state  Long Term Goal(s): Improvement in symptoms so as ready for discharge   Short Term Goals: Ability to identify changes in lifestyle to reduce recurrence of condition will improve Ability to verbalize feelings will improve Ability to disclose and discuss suicidal ideas Ability to demonstrate self-control will improve Ability to identify and develop effective coping behaviors will improve Compliance with  prescribed medications will improve     Medication Management: Evaluate patient's response, side effects, and tolerance of medication regimen.  Therapeutic Interventions: 1 to 1 sessions, Unit Group sessions and Medication administration.  Evaluation of Outcomes: Not Progressing   RN Treatment Plan for Primary Diagnosis: Paraphrenic schizophrenia Long Term Goal(s): Knowledge of disease and therapeutic regimen to maintain health will improve  Short Term Goals: Ability to remain free from injury will improve, Ability to verbalize frustration and anger appropriately will improve, Ability to demonstrate self-control, Ability to participate in decision making will improve, Ability to verbalize feelings will improve, Ability to disclose and discuss suicidal ideas, Ability to identify and develop effective coping behaviors will improve, and Compliance with prescribed medications will improve  Medication Management: RN will administer medications as ordered by provider, will assess and evaluate patient's response and provide education to patient for prescribed medication. RN will report any adverse and/or side effects to prescribing provider.  Therapeutic Interventions: 1 on 1 counseling sessions, Psychoeducation, Medication administration, Evaluate responses to treatment, Monitor vital signs and CBGs as ordered, Perform/monitor CIWA, COWS, AIMS and Fall Risk screenings as ordered, Perform wound care treatments as ordered.  Evaluation of Outcomes: Not Progressing   LCSW Treatment Plan for Primary Diagnosis: Paraphrenic schizophrenia Long Term Goal(s): Safe transition to appropriate next level of care at discharge, Engage patient in therapeutic group addressing interpersonal concerns.  Short Term Goals: Engage patient in aftercare planning with referrals and resources, Increase social support, Increase ability to appropriately verbalize feelings, Increase emotional regulation, Facilitate acceptance of  mental health diagnosis and concerns, Facilitate patient progression through stages of change regarding substance use diagnoses and concerns, Identify triggers associated with mental health/substance abuse issues, and Increase skills for wellness and recovery  Therapeutic Interventions: Assess for all discharge needs, 1 to 1 time with Social worker, Explore available resources and support systems, Assess for adequacy in community support network, Educate family and significant other(s) on suicide prevention, Complete Psychosocial Assessment, Interpersonal group therapy.  Evaluation of Outcomes: Not Progressing   Progress in Treatment: Attending groups: Yes. Participating in groups: Yes. Taking medication as prescribed: Yes. Toleration medication: Yes. Family/Significant other contact made: No, will contact:  Mother or Hjeim (sister)-[phone] Patient understands diagnosis: No. Discussing patient identified problems/goals with staff: Yes. Medical problems stabilized or resolved: Yes. Denies suicidal/homicidal ideation: Yes. Issues/concerns per patient self-inventory: No.   New problem(s) identified: No, Describe:  None reported   New Short Term/Long Term Goal(s): detox, medication management for mood stabilization; elimination of SI thoughts; development of comprehensive mental wellness/sobriety plan  Patient Goals: " Complete this program so I can go home , stay away from cigarettes and drugs and stay quiet. "  Discharge Plan or Barriers: Patient recently admitted. CSW will continue to follow and assess for appropriate referrals and possible discharge planning.    Reason for Continuation of Hospitalization: Aggression Anxiety Delusions  Depression Medication stabilization Withdrawal symptoms  Estimated Length of Stay:  5-7 days   Last 3 [place] Suicide Severity Risk Score: Flowsheet Row Admission (Current) from 10/06/2022 in [place] [facility] ED from  10/05/2022 in [facility] ED from 07/23/2022 in [facility] Department at [place] No Risk No Risk No Risk       Last [facility] 2/9 Scores:     No data to display          Scribe for Treatment Team: Charlett Lango 10/07/2022 12:44 PM

## 2022-10-07 NOTE — Plan of Care (Signed)
  Problem: Education: Goal: Verbalization of understanding the information provided will improve Outcome: Progressing   Problem: Coping: Goal: Will verbalize feelings Outcome: Progressing

## 2022-10-07 NOTE — Progress Notes (Signed)
Adult Psychoeducational Group Note  Date:  10/07/2022 Time:  8:53 PM  Group Topic/Focus:  Wrap-Up Group:   The focus of this group is to help patients review their daily goal of treatment and discuss progress on daily workbooks.  Participation Level:  Active  Participation Quality:  Appropriate  Affect:  Appropriate  Cognitive:  Disorganized  Insight: Good  Engagement in Group:  Engaged  Modes of Intervention:  Discussion  Additional Comments:   Pt states that he had a good day and has had a good time interacting with his peers. Pt called his sister and had a positive conversation with her. Pt is tangential and gets easily distracted. Pt denies everything  Gerhard Perches 10/07/2022, 8:53 PM

## 2022-10-07 NOTE — Group Note (Unsigned)
Date:  10/07/2022 Time:  5:01 PM  Group Topic/Focus:  Orientation:   The focus of this group is to educate the patient on the purpose and policies of crisis stabilization and provide a format to answer questions about their admission.  The group details unit policies and expectations of patients while admitted.     Participation Level:  {BHH PARTICIPATION [id number]  Participation Quality:  {BHH PARTICIPATION QUALITY:22265}  Affect:  {BHH AFFECT:22266}  Cognitive:  {BHH COGNITIVE:22267}  Insight: {BHH Insight2:20797}  Engagement in Group:  {BHH ENGAGEMENT IN BP:[id number]  Modes of Intervention:  {BHH MODES OF INTERVENTION:22269}  Additional Comments:  ***  Jerrye Beavers 10/07/2022, 5:01 PM

## 2022-10-07 NOTE — Group Note (Unsigned)
Date:  10/07/2022 Time:  11:07 AM  Group Topic/Focus:  Goals Group:   The focus of this group is to help patients establish daily goals to achieve during treatment and discuss how the patient can incorporate goal setting into their daily lives to aide in recovery.     Participation Level:  {BHH PARTICIPATION WO:[id number]  Participation Quality:  {BHH PARTICIPATION QUALITY:22265}  Affect:  {BHH AFFECT:22266}  Cognitive:  {BHH COGNITIVE:22267}  Insight: {BHH Insight2:20797}  Engagement in Group:  {BHH ENGAGEMENT IN BP:[id number]  Modes of Intervention:  {BHH MODES OF INTERVENTION:22269}  Additional Comments:  ***  Jerrye Beavers 10/07/2022, 11:07 AM

## 2022-10-07 NOTE — Progress Notes (Signed)
   Patient presents with complaint of anxiety and agitation.  PRN Benadryl, Haldol, and Ativan administered per [facility] per patient request.

## 2022-10-08 LAB — HEMOGLOBIN A1C
Hgb A1c MFr Bld: 5.7 % — ABNORMAL HIGH (ref 4.8–5.6)
Mean Plasma Glucose: 117 mg/dL

## 2022-10-08 MED ORDER — NICOTINE POLACRILEX 2 MG MT GUM
2.0000 mg | CHEWING_GUM | OROMUCOSAL | Status: DC | PRN
  Administered 2022-10-08 – 2022-10-10 (×4): 2 mg via ORAL
  Filled 2022-10-08: qty 1

## 2022-10-08 NOTE — Group Note (Signed)
Occupational Therapy Group Note  Group Topic: Sleep Hygiene  Group Date: 10/08/2022 Start Time: 1400 End Time: 1430 Facilitators: Brantley Stage, OT   Group Description: Group encouraged increased participation and engagement through topic focused on sleep hygiene. Patients reflected on the quality of sleep they typically receive and identified areas that need improvement. Group was given background information on sleep and sleep hygiene, including common sleep disorders. Group members also received information on how to improve one's sleep and introduced a sleep diary as a tool that can be utilized to track sleep quality over a length of time. Group session ended with patients identifying one or more strategies they could utilize or implement into their sleep routine in order to improve overall sleep quality.        Therapeutic Goal(s):  Identify one or more strategies to improve overall sleep hygiene  Identify one or more areas of sleep that are negatively impacted (sleep too much, too little, etc)     Participation Level: Engaged   Participation Quality: Independent   Behavior: Appropriate   Speech/Thought Process: Loose association    Affect/Mood: Flat   Insight: Fair   Judgement: Limited   Individualization: pt was engaged in their participation of group discussion/activity. New skills were identified  Modes of Intervention: Discussion  Patient Response to Interventions:  Attentive   Plan: Continue to engage patient in OT groups 2 - 3x/week.  10/08/2022  Brantley Stage, OT   Cornell Barman, OT

## 2022-10-08 NOTE — Progress Notes (Signed)
   10/08/22 [id number]  Psych Admission Type (Psych Patients Only)  Admission Status Involuntary  Psychosocial Assessment  Patient Complaints Anxiety  Eye Contact Fair  Facial Expression Flat  Affect Anxious  Speech Soft  Interaction Assertive  Motor Activity Slow  Appearance/Hygiene Unremarkable  Behavior Characteristics Cooperative;Appropriate to situation  Mood Depressed;Anxious  Aggressive Behavior  Targets Family  Type of Behavior Striking out  Effect No apparent injury  Thought Process  Coherency WDL  Content WDL  Delusions WDL  Perception WDL  Hallucination None reported or observed  Judgment WDL  Confusion Severe  Danger to Self  Current suicidal ideation? Denies  Agreement Not to Harm Self Yes  Description of Agreement verbal  Danger to Others  Danger to Others None reported or observed

## 2022-10-08 NOTE — Progress Notes (Signed)
[facility] MD Progress Note  10/08/2022 5:31 PM [name] [name]   MRN:  [id number]  Principal Problem: Paraphrenic schizophrenia  Diagnosis: Principal Problem:   Paraphrenic schizophrenia Active Problems:   History of methamphetamine use   Insomnia   Anxiety state  Reason for admission: [name] [name] is a 42 yo male with a history of p. schizophrenia who was taken to the [facility] health urgent care by his community assertive treatment team for worsening inability to tend to personal hygiene needs, poor sleep, restlessness, decreased appetite with weight loss, auditory hallucinations & aggressive behaviors towards his family members in the context of medication non compliance x several months.  Patient was involuntarily committed by his ACTT provider, prior to transportation to this [facility] for treatment and stabilization of his mental status.   Daily notes: [name] [name] is seen. Chart reviewed. The chart findings discussed with the treatment team. He presents alert, oriented & aware of situation. However, seems a bit disorganized in his thinking & processing information. He is able to express his thoughts in the best possible way he knows how. He reports, "I probably did not know why I'm here. Some provider from the [facility] of  care Act team came to see me. She probably thought I was not looking okay. She thought that I was not sleeping. But I was. She made me come here. I'm up already this morning. I took a shower. I ate breakfast. I went to group. But nothing changed. I feel okay. I'm taking my medicines". [name] [name] currently denies any SIHI, AVH, delusional thoughts or paranoia. He does not appear to be responding to any internal stimuli. He denies any side effects from his medications.  Reviewed vital signs. Diastolic blood press is elevated: 137/90. Patient is currently in no apparent distress. Will recheck vital signs. There are no changes made on his current plan of care. Will continue as already in  progress. Will obtain Lithium level tomorrow 10-09-22.  Total Time spent with patient:  35 minutes  Past Psychiatric History: See H & P  Past Medical History:  Past Medical History:  Diagnosis Date   Anxiety    Depression    Mental disorder    Schizophrenia    History reviewed. No pertinent surgical history.  Family History:  Family History  Problem Relation Age of Onset   Mental illness Neg Hx    Family Psychiatric  History: See H & P  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, Pt uses meth    Social History   Socioeconomic History   Marital status: Married    Spouse name: Not on file   Number of children: 1   Years of education: Not on file   Highest education level: Not on file  Occupational History   Occupation: Disabled  Tobacco Use   Smoking status: Every Day    Packs/day: 1    Types: Cigarettes   Smokeless tobacco: Former  Scientific laboratory technician Use: Unknown  Substance and Sexual Activity   Alcohol use: Not Currently    Comment: haven't drank in months"   Drug use: Yes    Types: Marijuana, Methamphetamines    Comment: Per sister, Pt uses meth   Sexual activity: Yes  Birth control/protection: None  Other Topics Concern   Not on file  Social History Narrative   Pt refusing to answer any questions during assessment. Pt lives with mother who IVC'd him.      Pt lives with parents and sister.  He is married, but currently separated.  Pt is on disability.   Social Determinants of Health   Financial Resource Strain: Not on file  Food Insecurity: Not on file  Transportation Needs: Not on file  Physical Activity: Not on file  Stress: Not on file  Social Connections: Not on file   Sleep: Fair  Appetite:  Good  Current  Medications: Current Facility-Administered Medications  Medication Dose Route Frequency Provider Last Rate Last Admin   acetaminophen (TYLENOL) tablet 650 mg  650 mg Oral Q6H PRN Hezzie Bump, NP       alum & mag hydroxide-simeth (MAALOX/MYLANTA) 200-200-20 MG/5ML suspension 30 mL  30 mL Oral Q4H PRN Hezzie Bump, NP       diphenhydrAMINE (BENADRYL) capsule 50 mg  50 mg Oral TID PRN Hezzie Bump, NP   50 mg at 10/07/22 0114   Or   diphenhydrAMINE (BENADRYL) injection 50 mg  50 mg Intramuscular TID PRN Hezzie Bump, NP       haloperidol (HALDOL) tablet 5 mg  5 mg Oral TID PRN Hezzie Bump, NP   5 mg at 10/07/22 0114   Or   haloperidol lactate (HALDOL) injection 5 mg  5 mg Intramuscular TID PRN Hezzie Bump, NP       hydrOXYzine (ATARAX) tablet 25 mg  25 mg Oral TID PRN [name], NP   25 mg at 10/07/22 2038   lithium carbonate capsule 300 mg  300 mg Oral BID Hezzie Bump, NP   300 mg at 10/08/22 [id number]   LORazepam (ATIVAN) tablet 2 mg  2 mg Oral TID PRN Hezzie Bump, NP   2 mg at 10/07/22 0114   Or   LORazepam (ATIVAN) injection 2 mg  2 mg Intramuscular TID PRN Hezzie Bump, NP       magnesium hydroxide (MILK OF MAGNESIA) suspension 30 mL  30 mL Oral Daily PRN Hezzie Bump, NP       nicotine polacrilex (NICORETTE) gum 2 mg  2 mg Oral PRN Massengill, Ovid Curd, MD       OLANZapine (ZYPREXA) tablet 20 mg  20 mg Oral QHS Hezzie Bump, NP   20 mg at 10/07/22 2038   traZODone (DESYREL) tablet 50 mg  50 mg Oral QHS [name], NP   50 mg at 10/07/22 2038   Vitamin D (Ergocalciferol) (DRISDOL) 1.25 MG (50000 UNIT) capsule 50,000 Units  50,000 Units Oral Q7 days [name], NP   50,000 Units at 10/08/22 [id number]   Lab Results:  Results for orders placed or performed during the hospital encounter of 10/06/22 (from the past 48 hour(s))  Urinalysis, Routine w reflex microscopic -Urine, Clean Catch     Status: Abnormal   Collection Time: 10/06/22   7:41 PM  Result Value Ref Range   Color, Urine STRAW (A) YELLOW   APPearance CLEAR CLEAR   Specific Gravity, Urine 1.010 1.005 - 1.030   pH 6.0 5.0 - 8.0   Glucose, UA NEGATIVE NEGATIVE mg/dL   Hgb urine dipstick NEGATIVE NEGATIVE   Bilirubin Urine NEGATIVE NEGATIVE   Ketones, ur NEGATIVE NEGATIVE mg/dL   Protein, ur NEGATIVE NEGATIVE mg/dL   Nitrite NEGATIVE NEGATIVE  Leukocytes,Ua NEGATIVE NEGATIVE    Comment: Performed at [facility], [place] [address] [place], [place] [address]  VITAMIN D 25 Hydroxy (Vit-D Deficiency, Fractures)     Status: Abnormal   Collection Time: 10/07/22  6:30 AM  Result Value Ref Range   Vit D, 25-Hydroxy 11.00 (L) 30 - 100 ng/mL    Comment: (NOTE) Vitamin D deficiency has been defined by the Institute of Medicine  and an Endocrine Society practice guideline as a level of serum 25-OH  vitamin D less than 20 ng/mL (1,2). The Endocrine Society went on to  further define vitamin D insufficiency as a level between 21 and 29  ng/mL (2).  1. IOM (Institute of Medicine). 2010. Dietary reference intakes for  calcium and D. [place]: The [employer]. 2. Holick MF, Binkley [place], Bischoff-Ferrari HA, et al. Evaluation,  treatment, and prevention of vitamin D deficiency: an Endocrine  Society clinical practice guideline, JCEM. 2011 Jul; 96(7): 1911-30.  Performed at [facility], [place] [address] [place],  [address]   Hemoglobin A1c     Status: Abnormal   Collection Time: 10/07/22  6:30 AM  Result Value Ref Range   Hgb A1c MFr Bld 5.7 (H) 4.8 - 5.6 %    Comment: (NOTE)         Prediabetes: 5.7 - 6.4         Diabetes: >6.4         Glycemic control for adults with diabetes: <7.0    Mean Plasma Glucose 117 mg/dL    Comment: (NOTE) Performed At: [facility] [place], [place] [id number] Rush Farmer MD [id number]    Blood Alcohol level:  Lab Results  Component Value Date   [facility]  <10 10/05/2022   ETH <10 [id number]    Metabolic Disorder Labs: Lab Results  Component Value Date   HGBA1C 5.7 (H) 10/07/2022   MPG 117 10/07/2022   MPG 114 10/05/2022   Lab Results  Component Value Date   PROLACTIN 17.4 10/05/2022   PROLACTIN 45.8 (H) 12/19/2017   Lab Results  Component Value Date   CHOL 156 10/05/2022   TRIG 116 10/05/2022   HDL 40 (L) 10/05/2022   CHOLHDL 3.9 10/05/2022   VLDL 23 10/05/2022   LDLCALC 93 10/05/2022   LDLCALC 69 11/21/2020   Physical Findings: AIMS:  , ,  ,  ,    CIWA:    COWS:     Musculoskeletal: Strength & Muscle Tone: within normal limits Gait & Station: normal Patient leans: N/A  Psychiatric Specialty Exam:  Presentation  General Appearance:  Appropriate for Environment; Fairly Groomed  Eye Contact: Fair  Speech: Clear and Coherent  Speech Volume: Normal  Handedness: Right  Mood and Affect  Mood: Depressed  Affect: Congruent  Thought Process  Thought Processes: Coherent  Descriptions of Associations:Intact  Orientation:Full (Time, Place and Person)  Thought Content:Logical  History of Schizophrenia/Schizoaffective disorder:Yes  Duration of Psychotic Symptoms:Greater than six months  Hallucinations:Hallucinations: None  Ideas of Reference:None  Suicidal Thoughts:Suicidal Thoughts: No  Homicidal Thoughts:Homicidal Thoughts: No  Sensorium  Memory: Immediate Good  Judgment: Fair  Insight: Fair  Materials engineer: Fair  Attention Span: Fair  Recall: [employer] of Knowledge: Fair  Language: Fair  Psychomotor Activity  Psychomotor Activity: Psychomotor Activity: Normal  Assets  Assets: Communication Skills; Social Support; Resilience  Sleep  Sleep: Sleep: Fair  Physical Exam: Physical Exam Constitutional:  Appearance: Normal appearance.  HENT:     Head: Normocephalic.     Nose: Nose normal.  Eyes:     Pupils: Pupils are equal, round,  and reactive to light.  Pulmonary:     Effort: Pulmonary effort is normal.  Musculoskeletal:        General: Normal range of motion.     Cervical back: Normal range of motion.  Neurological:     Mental Status: He is alert and oriented to person, place, and time.  Psychiatric:        Behavior: Behavior normal.    Review of Systems  Constitutional:  Negative for fever.  HENT:  Negative for hearing loss.   Eyes:  Negative for blurred vision.  Respiratory: Negative.    Cardiovascular: Negative.   Gastrointestinal:  Negative for heartburn.  Genitourinary:  Negative for dysuria.  Musculoskeletal:  Negative for myalgias.  Skin: Negative.   Neurological:  Negative for dizziness.  Psychiatric/Behavioral:  Positive for depression and hallucinations. Negative for memory loss, substance abuse and suicidal ideas. The patient is nervous/anxious and has insomnia.    Blood pressure (!) 137/90, pulse 88, temperature 98.2 F (36.8 C), temperature source Oral, resp. rate 18, height 5\' 3"  (1.6 m), weight 68 kg, SpO2 99 %. Body mass index is 26.57 kg/m.  Treatment Plan Summary: Daily contact with patient to assess and evaluate symptoms and progress in treatment and Medication management.   Continue inpatient hospitalization. Will continue today 10/08/2022 plan as below except where it is noted.     Diagnoses Principal Problem:   Paraphrenic schizophrenia Active Problems:   History of methamphetamine use   Insomnia   Anxiety state   Medications -Continue olanzapine 20 mg nightly for psychosis/mood stabilization (Home medication) -Continue lithium 300 mg twice daily for mood stabilization (home medication)-will complete Level on 10/09/2022 -Continue hydroxyzine 25 mg as needed 3 times daily for anxiety -Continue trazodone 50 mg nightly for sleep -Continue Vitamin D 50,000 units weekly for Vitamin D deficiency -Continue agitation protocol medications: Haldol 5 mg/Ativan 2 mg/Benadryl 50 mg p.o.  or IM as needed 3 times daily for agitation   Other PRNS -Continue Tylenol 650 mg every 6 hours PRN for mild pain -Continue Maalox 30 mg every 4 hrs PRN for indigestion -Continue Milk of Magnesia as needed every 6 hrs for constipation   Discharge Planning: Social work and case management to assist with discharge planning and identification of hospital follow-up needs prior to discharge Estimated LOS: 5-7 days Discharge Concerns: Need to establish a safety plan; Medication compliance and effectiveness Discharge Goals: Return home with outpatient referrals for mental health follow-up including medication management/psychotherapy   I certify that inpatient services furnished can reasonably be expected to improve the patient's condition.     Lindell Spar, NP, pmhnp, fnp-bc. 10/08/2022, 5:31 PMPatient ID: [name] [name], male   DOB: 02-Jan-1981, 42 y.o.   MRN: [id number]

## 2022-10-08 NOTE — Plan of Care (Signed)
  Problem: Education: Goal: Knowledge of [facility] [facility] Education information/materials will improve Outcome: Progressing Goal: Emotional status will improve Outcome: Progressing Goal: Mental status will improve Outcome: Progressing Goal: Verbalization of understanding the information provided will improve Outcome: Progressing   Problem: Education: Goal: Will be free of psychotic symptoms Outcome: Progressing Goal: Knowledge of the prescribed therapeutic regimen will improve Outcome: Progressing

## 2022-10-08 NOTE — Group Note (Signed)
Recreation Therapy Group Note   Group Topic:Self-Esteem  Group Date: 10/08/2022 Start Time: [id number] End Time: 1040 Facilitators: Connelly Spruell-McCall, LRT,CTRS Location: [address] Dayroom   Goal Area(s) Addresses:  Patient will identify and write positive traits about themself. Patient will successfully identify mottos/quotes, interests, etc that express their feelings. Patient will acknowledge the benefit of healthy self-esteem. Patient will endorse understanding of ways to increase self-esteem.   Group Description:  LRT began group session with open dialogue asking the patients to define self-esteem and verbally identify positive qualities and traits people may possess. Patients were then instructed to design a personalized license plate, with words and drawings, representing at least 3 positive things about themselves. Pts were encouraged to include favorites, things they are proud of, what they enjoy doing, and dreams for their future. If a patient had a life motto or a meaningful phase that expressed their life values, pt's were asked to incorporate that into their design as well. Patients were given the opportunity to share their completed work with the group.     Affect/Mood: Appropriate   Participation Level: Engaged   Participation Quality: Independent   Behavior: Appropriate   Speech/Thought Process: Focused   Insight: Moderate   Judgement: Moderate   Modes of Intervention: Art   Patient Response to Interventions:  Engaged   Education Outcome:  Acknowledges education and In group clarification offered    Clinical Observations/Individualized Feedback: Pt expressed being able to identify the positive things about himself.  Some of the quotes pt lives by are "listen to your heart; get high and ride or die; bud smokers only; love you forever; and puff puff be gone".  Pt named his daughter Janett Billow' and an influence.  Pt also identified "Smurf" as his nickname.    Plan:  Continue to engage patient in RT group sessions 2-3x/week.   Jermane Brayboy-McCall, LRT,CTRS  10/08/2022 12:18 PM

## 2022-10-08 NOTE — BHH Suicide Risk Assessment (Signed)
[place] INPATIENT:  Family/Significant Other Suicide Prevention Education  Suicide Prevention Education:  Education Completed; - Hjeim (sister)-[phone]      ,  (name of family member/significant other) has been identified by the patient as the family member/significant other with whom the patient will be residing, and identified as the person(s) who will aid the patient in the event of a mental health crisis (suicidal ideations/suicide attempt).  With written consent from the patient, the family member/significant other has been provided the following suicide prevention education, prior to the and/or following the discharge of the patient.  CSW spoke to patient sister who reports no additional safety concerns.  No access to guns/weapons. Mother will be picking patient up.  The suicide prevention education provided includes the following: Suicide risk factors Suicide prevention and interventions National Suicide Hotline telephone number [facility] assessment telephone number [facility] Emergency Assistance [place] and/or Residential Mobile Crisis Unit telephone number  Request made of family/significant other to: Remove weapons (e.g., guns, rifles, knives), all items previously/currently identified as safety concern.   Remove drugs/medications (over-the-counter, prescriptions, illicit drugs), all items previously/currently identified as a safety concern.  The family member/significant other verbalizes understanding of the suicide prevention education information provided.  The family member/significant other agrees to remove the items of safety concern listed above.  Baya Lentz E Joab Carden 10/08/2022, 11:57 AM

## 2022-10-08 NOTE — Group Note (Signed)
Date:  10/08/2022 Time:  9:02 PM  Group Topic/Focus:  Wrap-Up Group:   The focus of this group is to help patients review their daily goal of treatment and discuss progress on daily workbooks.    Participation Level:  Active  Participation Quality:  Appropriate  Affect:  Appropriate  Cognitive:  Appropriate  Insight: Appropriate  Engagement in Group:  Engaged  Modes of Intervention:  Education  Additional Comments:  Patient attended and participated in group tonight. He reports that a positive thing that happened to him today was that he learn that he will be discharged on Saturday. He also learn that he can sing  Debe Coder 10/08/2022, 9:02 PM

## 2022-10-08 NOTE — Progress Notes (Signed)
Adult Psychoeducational Group Note  Date:  10/08/2022 Time:  12:09 PM  Group Topic/Focus:  Goals Group:   The focus of this group is to help patients establish daily goals to achieve during treatment and discuss how the patient can incorporate goal setting into their daily lives to aide in recovery. Orientation:   The focus of this group is to educate the patient on the purpose and policies of crisis stabilization and provide a format to answer questions about their admission.  The group details unit policies and expectations of patients while admitted.  Participation Level:  Active  Participation Quality:  Appropriate  Affect:  Appropriate  Cognitive:  Appropriate  Insight: Appropriate  Engagement in Group:  Engaged  Modes of Intervention:  Discussion  Additional Comments:  Pt attended the goals/orientation group and remained appropriate and engaged throughout the duration of the group.   Beryle Beams 10/08/2022, 12:09 PM

## 2022-10-09 NOTE — Group Note (Signed)
Recreation Therapy Group Note   Group Topic:Problem Solving  Group Date: 10/09/2022 Start Time: 1005 End Time: 1030 Facilitators: Toy Samarin-McCall, LRT,CTRS Location: [address] Dayroom   Goal Area(s) Addresses:  Patient will effectively work with peer towards shared goal.  Patient will identify skills used to make activity successful.  Patient will share challenges and verbalize solution-driven approaches used. Patient will identify how skills used during activity can be used to reach post d/c goals.   Group Description: [employer]. Patients were provided the following materials: 2 drinking straws, 5 rubber bands, 5 paper clips, 2 index cards, 2 drinking cups, and 2 toilet paper rolls. Using the provided materials patients were asked to build a launching mechanism to launch a ping pong ball across the room, approximately 10 feet. Patients were divided into teams of 3-5. Instructions required all materials be incorporated into the device, functionality of items left to the peer group's discretion.   Affect/Mood: Appropriate   Participation Level: Engaged   Participation Quality: Independent   Behavior: Appropriate   Speech/Thought Process: Focused   Insight: Good   Judgement: Good   Modes of Intervention: STEM Activity   Patient Response to Interventions:  Engaged   Education Outcome:  Acknowledges education and In group clarification offered    Clinical Observations/Individualized Feedback: Pt was focused on activity.  Pt was observed working through his ideas to create a launcher that would work.  Pt had no help or influence from peer to complete the activity.  Pt was concentrating and appropriate throughout group session.  Pt was bright and confident once he completed his launcher.   Plan: Continue to engage patient in RT group sessions 2-3x/week.   Seamus Warehime-McCall, LRT,CTRS  10/09/2022 12:40 PM

## 2022-10-09 NOTE — B[facility] Group Notes (Signed)
Spirituality group facilitated by Kathleen Argue, BCC.  Group Description: Group focused on topic of hope. Patients participated in facilitated discussion around topic, connecting with one another around experiences and definitions for hope. Group members engaged with visual explorer photos, reflecting on what hope looks like for them today. Group engaged in discussion around how their definitions of hope are present today in hospital.  Modalities: Psycho-social ed, Adlerian, Narrative, MI  Patient Progress: [name] [name] attended group and actively engaged and participated in group conversation.  His comments were mostly on topic and appropriate to group setting.  [address] Bcc Pager, [phone]

## 2022-10-09 NOTE — Progress Notes (Signed)
B[facility]/BMU LCSW Progress Note   10/09/2022    3:41 PM  [name] [name]   [phone]   Type of Contact and Topic:  Medicare Notice  Patient planned to be discharged tomorrow.  Patient informed of right to appeal discharge, provided phone number to [facility]. Patient expressed no interest in appealing discharge at this time. CSW will continue to monitor situation.     Signed:  Anson [place] MSW, LCSW, LCAS 10/09/2022 3:41 PM

## 2022-10-09 NOTE — Progress Notes (Signed)
Adult Psychoeducational Group Note  Date:  10/09/2022 Time:  8:57 PM  Group Topic/Focus:  Wrap-Up Group:   The focus of this group is to help patients review their daily goal of treatment and discuss progress on daily workbooks.  Participation Level:  Active  Participation Quality:  Appropriate  Affect:  Excited  Cognitive:  Appropriate  Insight: Good  Engagement in Group:  Engaged  Modes of Intervention:  Discussion and Support  Additional Comments:  Pt attended the evening group and responded to all discussion prompts from the Writer. Pt shared that he was excited about his upcoming discharge, mostly because he missed his cigarettes. On the subject of staying well upon discharge, [name] [name] mentioned taking his medication as prescribed and also being more social. "I see my brother a lot, but I should drive to go see my daughter." Pt rated his day a 6 out of 10.  Christ Kick 10/09/2022, 8:57 PM

## 2022-10-09 NOTE — Plan of Care (Signed)
  Problem: Education: Goal: Knowledge of [facility] Education information/materials will improve Outcome: Progressing Goal: Emotional status will improve Outcome: Progressing Goal: Mental status will improve Outcome: Progressing Goal: Verbalization of understanding the information provided will improve Outcome: Progressing   Problem: Education: Goal: Will be free of psychotic symptoms Outcome: Progressing Goal: Knowledge of the prescribed therapeutic regimen will improve Outcome: Progressing   Problem: Coping: Goal: Ability to identify and develop effective coping behavior will improve Outcome: Progressing   Problem: Coping: Goal: Coping ability will improve Outcome: Progressing Goal: Will verbalize feelings Outcome: Progressing

## 2022-10-09 NOTE — Progress Notes (Signed)
   10/08/22 2200  Psych Admission Type (Psych Patients Only)  Admission Status Involuntary  Psychosocial Assessment  Patient Complaints None  Eye Contact Fair  Facial Expression Animated  Affect Appropriate to circumstance  Speech Soft  Interaction Assertive  Motor Activity Pacing  Appearance/Hygiene Unremarkable  Behavior Characteristics Cooperative;Appropriate to situation  Mood Depressed;Anxious  Thought Process  Coherency WDL  Content WDL  Delusions None reported or observed  Perception WDL  Hallucination None reported or observed  Judgment WDL  Confusion Mild  Danger to Self  Current suicidal ideation? Denies  Agreement Not to Harm Self Yes  Description of Agreement verbal  Danger to Others  Danger to Others None reported or observed

## 2022-10-09 NOTE — Progress Notes (Signed)
Adult Psychoeducational Group Note  Date:  10/09/2022 Time:  11:07 AM  Group Topic/Focus:  Goals Group:   The focus of this group is to help patients establish daily goals to achieve during treatment and discuss how the patient can incorporate goal setting into their daily lives to aide in recovery. Orientation:   The focus of this group is to educate the patient on the purpose and policies of crisis stabilization and provide a format to answer questions about their admission.  The group details unit policies and expectations of patients while admitted.  Participation Level:  Active  Participation Quality:  Appropriate  Affect:  Appropriate  Cognitive:  Appropriate  Insight: Appropriate  Engagement in Group:  Engaged  Modes of Intervention:  Discussion  Additional Comments:  Pt attended the goals/orientation group and remained appropriate and engaged throughout the duration of the group.   Fara Olden O 10/09/2022, 11:07 AM

## 2022-10-09 NOTE — Progress Notes (Signed)
[facility] MD Progress Note  10/09/2022 5:39 PM [name] [name]   MRN:  [id number]  Principal Problem: Paraphrenic schizophrenia  Diagnosis: Principal Problem:   Paraphrenic schizophrenia Active Problems:   History of methamphetamine use   Insomnia   Anxiety state  Reason for admission: [name] [name] is a 42 yo male with a history of p. schizophrenia who was taken to the [facility] urgent care by his community assertive treatment team for worsening inability to tend to personal hygiene needs, poor sleep, restlessness, decreased appetite with weight loss, auditory hallucinations & aggressive behaviors towards his family members in the context of medication non compliance x several months.  Patient was involuntarily committed by his ACTT provider, prior to transportation to this [facility] for treatment and stabilization of his mental status.   Daily notes: [name] [name] is seen. Chart reviewed. The chart findings discussed with the treatment team. He presents alert, oriented & aware of situation. He presents more organized today. He reports, "I'm doing good. I'm enjoying the groups. I'm learning how to problem solve. The doctor told me that I will be discharged tomorrow. I'm excited. I feel ready to go home too". Akim currently denies any SIHI, AVH, delusional thoughts or paranoia. He does not appear to responding to any internal stimuli. Lithium level will be obtained tomorrow morning. If patient maintains stability, will be discharged in am.  Total Time spent with patient:  35 minutes  Past Psychiatric History: See H & P  Past Medical History:  Past Medical History:  Diagnosis Date   Anxiety    Depression    Mental disorder    Schizophrenia    History reviewed. No pertinent surgical history.  Family History:  Family History  Problem Relation Age of Onset   Mental illness Neg Hx    Family Psychiatric  History: See H & P  Social History:  Social History   Substance and Sexual  Activity  Alcohol Use Not Currently   Comment: haven't drank in months"     Social History   Substance and Sexual Activity  Drug Use Yes   Types: Marijuana, Methamphetamines   Comment: Per sister, Pt uses meth    Social History   Socioeconomic History   Marital status: Married    Spouse name: Not on file   Number of children: 1   Years of education: Not on file   Highest education level: Not on file  Occupational History   Occupation: Disabled  Tobacco Use   Smoking status: Every Day    Packs/day: 1    Types: Cigarettes   Smokeless tobacco: Former  Building services engineer Use: Unknown  Substance and Sexual Activity   Alcohol use: Not Currently    Comment: haven't drank in months"   Drug use: Yes    Types: Marijuana, Methamphetamines    Comment: 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 Pt lives with mother who IVC'd him.      Pt lives with parents and sister.  He is married, but currently separated.  Pt is on disability.   Social Determinants of Health   Financial Resource Strain: Not on file  Food Insecurity: Not on file  Transportation Needs: Not on file  Physical Activity: Not on file  Stress: Not on file  Social Connections: Not  on file   Sleep: Fair  Appetite:  Good  Current Medications: Current Facility-Administered Medications  Medication Dose Route Frequency Provider Last Rate Last Admin   acetaminophen (TYLENOL) tablet 650 mg  650 mg Oral Q6H PRN Sunday CornSellers, Amanda E, NP       alum & mag hydroxide-simeth (MAALOX/MYLANTA) 200-200-20 MG/5ML suspension 30 mL  30 mL Oral Q4H PRN Sunday CornSellers, Amanda E, NP       diphenhydrAMINE (BENADRYL) capsule 50 mg  50 mg Oral TID PRN Sunday CornSellers, Amanda E, NP   50 mg at 10/07/22 0114   Or   diphenhydrAMINE (BENADRYL) injection 50 mg  50 mg Intramuscular TID PRN Sunday CornSellers,  Amanda E, NP       haloperidol (HALDOL) tablet 5 mg  5 mg Oral TID PRN Sunday CornSellers, Amanda E, NP   5 mg at 10/07/22 0114   Or   haloperidol lactate (HALDOL) injection 5 mg  5 mg Intramuscular TID PRN Sunday CornSellers, Amanda E, NP       hydrOXYzine (ATARAX) tablet 25 mg  25 mg Oral TID PRN Starleen BlueNkwenti, Doris, NP   25 mg at 10/07/22 2038   lithium carbonate capsule 300 mg  300 mg Oral BID Sunday CornSellers, Amanda E, NP   300 mg at 10/09/22 1704   LORazepam (ATIVAN) tablet 2 mg  2 mg Oral TID PRN Sunday CornSellers, Amanda E, NP   2 mg at 10/07/22 0114   Or   LORazepam (ATIVAN) injection 2 mg  2 mg Intramuscular TID PRN Sunday CornSellers, Amanda E, NP       magnesium hydroxide (MILK OF MAGNESIA) suspension 30 mL  30 mL Oral Daily PRN Sunday CornSellers, Amanda E, NP       nicotine polacrilex (NICORETTE) gum 2 mg  2 mg Oral PRN Massengill, Harrold DonathNathan, MD   2 mg at 10/09/22 0756   OLANZapine (ZYPREXA) tablet 20 mg  20 mg Oral QHS Sunday CornSellers, Amanda E, NP   20 mg at 10/08/22 2036   traZODone (DESYREL) tablet 50 mg  50 mg Oral QHS Starleen BlueNkwenti, Doris, NP   50 mg at 10/08/22 2036   Vitamin D (Ergocalciferol) (DRISDOL) 1.25 MG (50000 UNIT) capsule 50,000 Units  50,000 Units Oral Q7 days Starleen BlueNkwenti, Doris, NP   50,000 Units at 10/08/22 [id number]   Lab Results:  No results found for this or any previous visit (from the past 48 hour(s)).  Blood Alcohol level:  Lab Results  Component Value Date   ETH <10 10/05/2022   ETH <10 11/17/2020    Metabolic Disorder Labs: Lab Results  Component Value Date   HGBA1C 5.7 (H) 10/07/2022   MPG 117 10/07/2022   MPG 114 10/05/2022   Lab Results  Component Value Date   PROLACTIN 17.4 10/05/2022   PROLACTIN 45.8 (H) 12/19/2017   Lab Results  Component Value Date   CHOL 156 10/05/2022   TRIG 116 10/05/2022   HDL 40 (L) 10/05/2022   CHOLHDL 3.9 10/05/2022   VLDL 23 10/05/2022   LDLCALC 93 10/05/2022   LDLCALC 69 11/21/2020   Physical Findings: AIMS:  , ,  ,  ,    CIWA:    COWS:     Musculoskeletal: Strength & Muscle Tone:  within normal limits Gait & Station: normal Patient leans: N/A  Psychiatric Specialty Exam:  Presentation  General Appearance:  Appropriate for Environment; Fairly Groomed  Eye Contact: Fair  Speech: Clear and Coherent  Speech Volume: Normal  Handedness: Right  Mood and Affect  Mood: Depressed  Affect: Congruent  Thought  Process  Thought Processes: Coherent  Descriptions of Associations:Intact  Orientation:Full (Time, Place and Person)  Thought Content:Logical  History of Schizophrenia/Schizoaffective disorder:Yes  Duration of Psychotic Symptoms:Greater than six months  Hallucinations:No data recorded  Ideas of Reference:None  Suicidal Thoughts:No data recorded  Homicidal Thoughts:No data recorded  Sensorium  Memory: Immediate Good  Judgment: Fair  Insight: Fair  Art therapist  Concentration: Fair  Attention Span: Fair  Recall: [employer] of Knowledge: Fair  Language: Fair  Psychomotor Activity  Psychomotor Activity: No data recorded  Assets  Assets: Communication Skills; Social Support; Resilience  Sleep  Sleep: No data recorded  Physical Exam: Physical Exam Constitutional:      Appearance: Normal appearance.  HENT:     Head: Normocephalic.     Nose: Nose normal.  Eyes:     Pupils: Pupils are equal, round, and reactive to light.  Pulmonary:     Effort: Pulmonary effort is normal.  Musculoskeletal:        General: Normal range of motion.     Cervical back: Normal range of motion.  Neurological:     Mental Status: He is alert and oriented to person, place, and time.  Psychiatric:        Behavior: Behavior normal.    Review of Systems  Constitutional:  Negative for fever.  HENT:  Negative for hearing loss.   Eyes:  Negative for blurred vision.  Respiratory: Negative.    Cardiovascular: Negative.   Gastrointestinal:  Negative for heartburn.  Genitourinary:  Negative for dysuria.  Musculoskeletal:   Negative for myalgias.  Skin: Negative.   Neurological:  Negative for dizziness.  Psychiatric/Behavioral:  Positive for depression and hallucinations. Negative for memory loss, substance abuse and suicidal ideas. The patient is nervous/anxious and has insomnia.    Blood pressure 130/88, pulse 79, temperature 98.2 F (36.8 C), temperature source Oral, resp. rate 18, height 5\' 3"  (1.6 m), weight 68 kg, SpO2 99 %. Body mass index is 26.57 kg/m.  Treatment Plan Summary: Daily contact with patient to assess and evaluate symptoms and progress in treatment and Medication management.   Continue inpatient hospitalization. Will continue today 10/09/2022 plan as below except where it is noted.     Diagnoses Principal Problem:   Paraphrenic schizophrenia Active Problems:   History of methamphetamine use   Insomnia   Anxiety state   Medications -Continue olanzapine 20 mg nightly for psychosis/mood stabilization (Home medication) -Continue lithium 300 mg twice daily for mood stabilization (home medication)-will complete Level on 10/09/2022 -Continue hydroxyzine 25 mg as needed 3 times daily for anxiety -Continue trazodone 50 mg nightly for sleep -Continue Vitamin D 50,000 units weekly for Vitamin D deficiency -Continue agitation protocol medications: Haldol 5 mg/Ativan 2 mg/Benadryl 50 mg p.o. or IM as needed 3 times daily for agitation   Other PRNS -Continue Tylenol 650 mg every 6 hours PRN for mild pain -Continue Maalox 30 mg every 4 hrs PRN for indigestion -Continue Milk of Magnesia as needed every 6 hrs for constipation   Discharge Planning: Social work and case management to assist with discharge planning and identification of hospital follow-up needs prior to discharge Estimated LOS: 5-7 days Discharge Concerns: Need to establish a safety plan; Medication compliance and effectiveness Discharge Goals: Return home with outpatient referrals for mental health follow-up including medication  management/psychotherapy   I certify that inpatient services furnished can reasonably be expected to improve the patient's condition.     Armandina Stammer, NP, pmhnp, fnp-bc. 10/09/2022, 5:39 PMPatient  ID: Richelle ItoSimon Rolf, male   DOB: 1980/10/24, 42 y.o.   MRN: [id number] Patient ID: Richelle ItoSimon Vanblarcom, male   DOB: 1980/10/24, 42 y.o.   MRN: [id number]

## 2022-10-09 NOTE — Progress Notes (Signed)
D: Patient alert and oriented, able to make needs known. Denies SI/HI, AVH at present. Denies pain at present. Patient goal today "staying home safely." Rates depression 0/10, hopelessness 0/10, and anxiety 0/10. Patient reports energy level as good. He reports he slept well last night. Patient does not request any PRN medication at this time.   A: Scheduled medications administered to patient per MD order. Support and encouragement provided. Routine safety checks conducted every fifteen minutes. Patient informed to notify staff with problems or concerns. Frequent verbal contact made.   R: No adverse drug reactions noted. Patient contracts for safety at this time. Patient is compliant with medications and treatment plan. Patient receptive, calm and cooperative. Patient interacts with others appropriately on unit at present. Patient remains safe at present.

## 2022-10-10 LAB — LITHIUM LEVEL: Lithium Lvl: 0.19 mmol/L — ABNORMAL LOW (ref 0.60–1.20)

## 2022-10-10 MED ORDER — TRAZODONE HCL 50 MG PO TABS: 50.0000 mg | ORAL_TABLET | Freq: Every day | ORAL | 0 refills | Status: DC

## 2022-10-10 MED ORDER — HYDROXYZINE HCL 25 MG PO TABS: 25.0000 mg | ORAL_TABLET | Freq: Three times a day (TID) | ORAL | 0 refills | Status: DC | PRN

## 2022-10-10 MED ORDER — NICOTINE POLACRILEX 2 MG MT GUM: 2.0000 mg | CHEWING_GUM | OROMUCOSAL | 0 refills | Status: DC | PRN

## 2022-10-10 MED ORDER — OLANZAPINE 20 MG PO TABS: 20.0000 mg | ORAL_TABLET | Freq: Every day | ORAL | 0 refills | Status: DC

## 2022-10-10 MED ORDER — LITHIUM CARBONATE 300 MG PO CAPS: 300.0000 mg | ORAL_CAPSULE | Freq: Two times a day (BID) | ORAL | 0 refills | Status: DC

## 2022-10-10 MED ORDER — CLONIDINE HCL 0.1 MG PO TABS
0.1000 mg | ORAL_TABLET | Freq: Once | ORAL | Status: AC
  Administered 2022-10-10: 0.1 mg via ORAL
  Filled 2022-10-10 (×2): qty 1

## 2022-10-10 MED ORDER — VITAMIN D (ERGOCALCIFEROL) 1.25 MG (50000 UNIT) PO CAPS: 50000.0000 [IU] | ORAL_CAPSULE | ORAL | 0 refills | Status: DC

## 2022-10-10 NOTE — Progress Notes (Signed)
Order received for patient discharge. Patient denies any SI HI or AV Hallucinations. NO signs of acute decompensation. Pt compliant with am medications and discharge instructions including AVS and crisis services reviewed. Patient belongings returned and pt escorted from unit to lobby to the care of family. Pt is safe.

## 2022-10-10 NOTE — Plan of Care (Signed)
  Problem: Education: Goal: Emotional status will improve Outcome: Progressing Goal: Mental status will improve Outcome: Progressing   Problem: Activity: Goal: Interest or engagement in leisure activities will improve Outcome: Progressing   Problem: Coping: Goal: Coping ability will improve Outcome: Progressing Goal: Will verbalize feelings Outcome: Progressing   

## 2022-10-10 NOTE — [facility] Suicide Risk Assessment (Signed)
Suicide Risk Assessment  Discharge Assessment    [facility] Discharge Suicide Risk Assessment   Principal Problem: Paraphrenic schizophrenia  Discharge Diagnoses: Principal Problem:   Paraphrenic schizophrenia Active Problems:   History of methamphetamine use   Insomnia   Anxiety state  Total Time spent with patient:  Greater than 30 minutes  Musculoskeletal: Strength & Muscle Tone: within normal limits Gait & Station: normal Patient leans: N/A  Psychiatric Specialty Exam  Presentation  General Appearance:  Appropriate for Environment; Casual; Fairly Groomed  Eye Contact: Good  Speech: Clear and Coherent; Normal Rate  Speech Volume: Normal  Handedness: Right   Mood and Affect  Mood: Euthymic  Duration of Depression Symptoms: No data recorded Affect: Appropriate; Congruent   Thought Process  Thought Processes: Coherent; Goal Directed; Linear  Descriptions of Associations:Intact  Orientation:Full (Time, Place and Person)  Thought Content:Logical  History of Schizophrenia/Schizoaffective disorder:Yes  Duration of Psychotic Symptoms:Greater than six months  Hallucinations:Hallucinations: None Description of Auditory Hallucinations: NA  Ideas of Reference:None  Suicidal Thoughts:Suicidal Thoughts: No  Homicidal Thoughts:Homicidal Thoughts: No   Sensorium  Memory: Immediate Good; Recent Good; Remote Good  Judgment: Good  Insight: Good   Executive Functions  Concentration: Good  Attention Span: Good  Recall: Good  Fund of Knowledge: Good  Language: Good   Psychomotor Activity  Psychomotor Activity: Psychomotor Activity: Normal   Assets  Assets: Communication Skills; Desire for Improvement; Financial Resources/Insurance; Housing; Social Support; Resilience; Physical Health   Sleep  Sleep: Sleep: Good Number of Hours of Sleep: 7.5   Physical Exam:  Blood pressure (!) 124/97, pulse 82, temperature 97.7 F (36.5  C), temperature source Oral, resp. rate 14, height 5\' 3"  (1.6 m), weight 68 kg, SpO2 99 %. Body mass index is 26.57 kg/m.  Mental Status Per Nursing Assessment::   On Admission:  NA  Demographic Factors:  Male, Adolescent or young adult, and Low socioeconomic status  Loss Factors: NA  Historical Factors: NA  Risk Reduction Factors:   Living with another person, especially a relative, Positive social support, Positive therapeutic relationship, and Positive coping skills or problem solving skills  Continued Clinical Symptoms:  Schizophrenia:   Paranoid or undifferentiated type Previous Psychiatric Diagnoses and Treatments  Cognitive Features That Contribute To Risk:  Closed-mindedness, Loss of executive function, and Polarized thinking    Suicide Risk:  Minimal: No identifiable suicidal ideation.  Patients presenting with no risk factors but with morbid ruminations; may be classified as minimal risk based on the severity of the depressive symptoms   Follow-up Information     [facility]. Go to.   Why: Please follow up with your ACTT for ongoing mental health care. Contact information: [address] [place] [address] [phone]                Plan Of Care/Follow-up recommendations:  See the discharge recommendations above.  Armandina Stammer, NP, pmhnp, fnp-bc. 10/10/2022, 8:54 AM

## 2022-10-10 NOTE — Progress Notes (Signed)
Adult Psychoeducational Group Note  Date:  10/10/2022 Time:  10:20 AM  Group Topic/Focus:  Goals Group:   The focus of this group is to help patients establish daily goals to achieve during treatment and discuss how the patient can incorporate goal setting into their daily lives to aide in recovery.  Participation Level:  Active  Participation Quality:  Appropriate  Affect:  Appropriate  Cognitive:  Appropriate  Insight: Appropriate  Engagement in Group:  Engaged  Modes of Intervention:  Discussion  Additional Comments: The patient engage in group  Octavio Manns 10/10/2022, 10:20 AM

## 2022-10-10 NOTE — Discharge Summary (Signed)
Physician Discharge Summary Note  Patient:  [name] [name] is an 42 y.o., male  MRN:  [id number]  DOB:  [birth year]  Patient phone:  [phone] (home)   Patient address:   [address] [place] [phone],   Total Time spent with patient:  Greater than 30 minutes  Date of Admission:  10/06/2022  Date of Discharge: 10-10-22  Reason for Admission: Worsening symptoms of schizophrenia.   Principal Problem: Paraphrenic schizophrenia  Discharge Diagnoses: Principal Problem:   Paraphrenic schizophrenia Active Problems:   History of methamphetamine use   Insomnia   Anxiety state  Past Psychiatric History: See H&P  Past Medical History:  Past Medical History:  Diagnosis Date   Anxiety    Depression    Mental disorder    Schizophrenia    History reviewed. No pertinent surgical history.  Family History:  Family History  Problem Relation Age of Onset   Mental illness Neg Hx    Family Psychiatric  History: See H&P  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, Pt uses meth    Social History   Socioeconomic History   Marital status: Married    Spouse name: Not on file   Number of children: 1   Years of education: Not on file   Highest education level: Not on file  Occupational History   Occupation: Disabled  Tobacco Use   Smoking status: Every Day    Packs/day: 1    Types: Cigarettes   Smokeless tobacco: Former  Building services engineerVaping Use   Vaping Use: Unknown  Substance and Sexual Activity   Alcohol use: Not Currently    Comment: haven't drank in months"   Drug use: Yes    Types: Marijuana, Methamphetamines    Comment: Per sister, Pt uses meth   Sexual activity: Yes    Birth control/protection: None  Other Topics Concern   Not on file  Social History  Narrative   Pt refusing to answer any questions during assessment. Pt lives with mother who IVC'd him.      Pt lives with parents and sister.  He is married, but currently separated.  Pt is on disability.   Social Determinants of Health   Financial Resource Strain: Not on file  Food Insecurity: Not on file  Transportation Needs: Not on file  Physical Activity: Not on file  Stress: Not on file  Social Connections: Not on file   Hospital Course: (Per admission evaluation notes):  42 yo male with a history of p. schizophrenia who was [place] [facility] by his community assertive treatment team for worsening inability to tend to personal hygiene needs, poor sleep, restlessness, decreased appetite with weight loss, auditory hallucinations & aggressive behaviors towards his family members in the context of medication non compliance x several months.  Patient was involuntarily committed by his ACTT provider, [facility] and stabilization of his mental status.   This is one of several psychiatric admissions[facility] summ[facility] for this 42 year old Asian male with hx of chronic mental illness & multiple psychiatric admissions. [facility] for worsening symptoms of Schizophrenia. He has been tried on multiple psychotropic medications for his symptoms &  it appeared his symptoms has not been able to improve or stabilize & yet, Jaykwon is known to be non-compliant to his treatment regimen pre-disposing him to relapses/recurrent of symptoms & frequent hospitalizations. He was brought to the [facility] this time around for evaluation & treatment for worsening & exacerbation of symptoms. Patient was apparently not taking his medications or attending to his personal needs/hygiene care.  After evaluation of his presenting symptoms, Butch was recommended for mood stabilization treatments. The medication regimen for his  presenting symptoms were discussed & with his consent initiated. He received, stabilized & was discharged on the medications as listed below on his discharge medication lists. He was also enrolled & participated in the group counseling sessions being offered & held on this unit. He learned coping skills. He presented on this admission, no other chronic medical conditions that required treatment & monitoring other an administration calcium supplement for his bone health. He tolerated his treatment regimen without any adverse effects or reactions reported.  During the course of this hospitalization, the 15-minute checks were adequate to ensure Vincen's safety.  Patient did not display any dangerous, violent or suicidal behavior on the unit. He interacted with patients & staff appropriately. He participated appropriately in the group sessions/therapies. His medications were addressed & adjusted to meet his needs. He was recommended for outpatient follow-up care & medication management upon discharge to assure his continuity of care.  At the time of discharge, patient is not reporting any acute suicidal/homicidal ideations. He currently denies any new issues or concerns. Education and supportive counseling provided throughout her hospital stay & upon discharge. His Lithium level was checked prior to discharge & the level is not at it's therapeutic range as of yet. This will in time get to the point as patient continues his treatment regimen after discharge. However, his mood has stabilized & patient presents with much improved symptoms as evidenced by his mannerism, behaviors & in taking care of himself.  Today upon his discharge evaluation his treatment team, Balin presents mentally & medically stable. He denies any other specific concerns. He is sleeping well. His appetite is good. He denies other physical complaints. He denies AH/VH, delusional thoughts or paranoia. He feels that his medications have been helpful  & is in agreement to continue his current treatment regimen as recommended. He was able to engage in safety planning including plan to return to [facility] or contact emergency services if he feels unable to maintain his own safety or the safety of others. Pt had no further questions, comments, or concerns. He left [facility] with all personal belongings in no apparent distress. Transportation per his family.  Physical Findings: AIMS:  , ,  ,  ,    CIWA:    COWS:     Musculoskeletal: Strength & Muscle Tone: within normal limits Gait & Station: normal Patient leans: N/A  Psychiatric Specialty Exam:  Presentation  General Appearance: Appropriate for Environment; Casual; Fairly Groomed  Eye Contact:Good  Speech:Clear and Coherent; Normal Rate  Speech Volume:Normal  Handedness:Right  Mood and Affect  Mood:Euthymic  Affect:Appropriate; Congruent  Thought Process  Thought Processes:Coherent; Goal Directed; Linear  Descriptions of Associations:Intact  Orientation:Full (Time, Place and Person)  Thought Content:Logical  History of Schizophrenia/Schizoaffective disorder:Yes  Duration of Psychotic Symptoms:Greater than six months  Hallucinations:Hallucinations: None Description of Auditory Hallucinations: NA   Ideas of Reference:None  Suicidal Thoughts:Suicidal Thoughts: No   Homicidal Thoughts:Homicidal Thoughts: No   Sensorium  Memory:Immediate Good; Recent Good; Remote Good  Judgment:Good  Insight:Good  Executive Functions  Concentration:Good  Attention Span:Good  Recall:Good  Fund of Knowledge:Good  Language:Good  Psychomotor Activity  Psychomotor Activity:Psychomotor Activity: Normal  Assets  Assets:Communication Skills; Desire for Improvement; Financial Resources/Insurance; Housing; Social Support; Resilience; Physical Health  Sleep  Sleep:Sleep: Good Number of Hours of Sleep: 7.5  Physical Exam: Physical Exam Vitals and nursing note reviewed.   Constitutional:      Appearance: Normal appearance.  HENT:     Head: Normocephalic.     Nose: Nose normal.     Mouth/Throat:     Pharynx: Oropharynx is clear.  Eyes:     Pupils: Pupils are equal, round, and reactive to light.  Cardiovascular:     Rate and Rhythm: Normal rate.     Pulses: Normal pulses.  Pulmonary:     Effort: Pulmonary effort is normal.  Genitourinary:    Comments: Deferred Musculoskeletal:        General: Normal range of motion.     Cervical back: Normal range of motion.  Skin:    General: Skin is warm and dry.  Neurological:     General: No focal deficit present.     Mental Status: He is alert and oriented to person, place, and time. Mental status is at baseline.  Psychiatric:        Attention and Perception: Attention normal. He does not perceive auditory or visual hallucinations.        Mood and Affect: Mood normal.        Speech: Speech normal.        Behavior: Behavior normal. Behavior is cooperative.        Thought Content: Thought content normal. Thought content is not paranoid or delusional. Thought content does not include homicidal or suicidal ideation. Thought content does not include homicidal or suicidal plan.        Cognition and Memory: Cognition normal.    Review of Systems  Constitutional:  Negative for fever.  HENT:  Negative for congestion, sinus pain and sore throat.   Eyes:  Negative for blurred vision.  Respiratory:  Negative for cough, shortness of breath and wheezing.   Cardiovascular:  Negative for chest pain and palpitations.  Gastrointestinal:  Negative for abdominal pain, constipation, diarrhea, heartburn, nausea and vomiting.  Genitourinary:  Negative for dysuria.  Musculoskeletal:  Negative for joint pain and myalgias.  Neurological:  Negative for dizziness, tingling, tremors, sensory change, speech change, focal weakness, seizures, loss of consciousness, weakness and headaches.  Endo/Heme/Allergies:        NKDA   Psychiatric/Behavioral:  Positive for hallucinations (Hx of Psychosis (stable on medication).). Negative for depression, memory loss, substance abuse and suicidal ideas. The patient has insomnia (Hx of (stable on medication).). The patient is not nervous/anxious (Stable upon discharge).    Blood pressure 128/85, pulse 82, temperature 97.7 F (36.5 C), temperature source Oral, resp. rate 14, height 5\' 3"  (1.6 m), weight 68 kg, SpO2 99 %. Body mass index is 26.57 kg/m.    Has this patient used any form of tobacco in the last 30 days? (Cigarettes, Smokeless Tobacco, Cigars, and/or Pipes):  Yes, Yes, A prescription for an FDA-approved tobacco cessation medication was offered at discharge and the patient refused  Blood Alcohol level:  Lab Results  Component Value Date   [facility] <10 10/05/2022   ETH <10 11/17/2020   Metabolic Disorder Labs:  Lab Results  Component Value Date   HGBA1C 5.7 (H) 10/07/2022   MPG 117 10/07/2022   MPG 114  10/05/2022   Lab Results  Component Value Date   PROLACTIN 17.4 10/05/2022   PROLACTIN 45.8 (H) 12/19/2017   Lab Results  Component Value Date   CHOL 156 10/05/2022   TRIG 116 10/05/2022   HDL 40 (L) 10/05/2022   CHOLHDL 3.9 10/05/2022   VLDL 23 10/05/2022   LDLCALC 93 10/05/2022   LDLCALC 69 11/21/2020   See Psychiatric Specialty Exam and Suicide Risk Assessment completed by Attending Physician prior to discharge.  Discharge destination:  Home  Is patient on multiple antipsychotic therapies at discharge:  Yes,   Do you recommend tapering to monotherapy for antipsychotics?  No   Has Patient had three or more failed trials of antipsychotic monotherapy by history:  No  Recommended Plan for Multiple Antipsychotic Therapies: NA  Allergies as of 10/10/2022   No Known Allergies      Medication List     STOP taking these medications    lithium 300 MG tablet Replaced by: lithium carbonate 300 MG capsule   Paliperidone Palmitate ER 546 MG/1.75ML  injection Commonly known as: INVEGA TRINZA       TAKE these medications      Indication  hydrOXYzine 25 MG tablet Commonly known as: ATARAX Take 1 tablet (25 mg total) by mouth 3 (three) times daily as needed for anxiety.  Indication: Feeling Anxious   lithium carbonate 300 MG capsule Take 1 capsule (300 mg total) by mouth 2 (two) times daily. For mood stabilization. Replaces: lithium 300 MG tablet  Indication: Mood stabilization.   nicotine polacrilex 2 MG gum Commonly known as: NICORETTE Take 1 each (2 mg total) by mouth as needed. (May buy from over the counter): For smoking cessation  Indication: Nicotine Addiction   OLANZapine 20 MG tablet Commonly known as: ZYPREXA Take 1 tablet (20 mg total) by mouth at bedtime. For mood control What changed:  when to take this additional instructions  Indication: Mood control   traZODone 50 MG tablet Commonly known as: DESYREL Take 1 tablet (50 mg total) by mouth at bedtime. For sleep  Indication: Trouble Sleeping   Vitamin D (Ergocalciferol) 1.25 MG (50000 UNIT) Caps capsule Commonly known as: DRISDOL Take 1 capsule (50,000 Units total) by mouth every 7 (seven) days. For bone health Start taking on: October 15, 2022  Indication: Vitamin D Deficiency         Follow-up Information     [facility], [facility]. Go to.   Why: Please follow up with your ACTT for ongoing mental health care. Contact information: [address] [place] [address] [phone]                Follow-up recommendations: Activity:  As tolerated Diet: As recommended by your primary care doctor. Keep all scheduled follow-up appointments as recommended.   Comments: Comments: Patient is recommended to follow-up care on an outpatient basis as noted above. Prescriptions sent to pt's pharmacy of choice at discharge.   Patient agreeable to plan.   Given opportunity to ask questions.   Appears to feel comfortable with discharge  denies any current suicidal or homicidal thought. Patient is also instructed prior to discharge to: Take all medications as prescribed by his/her mental healthcare provider. Report any adverse effects and or reactions from the medicines to his/her outpatient provider promptly. Patient has been instructed & cautioned: To not engage in alcohol and or illegal drug use while on prescription medicines. In the event of worsening symptoms, patient is instructed to call the  crisis hotline, 911 and or go to the nearest ED for appropriate evaluation and treatment of symptoms. To follow-up with his/her primary care provider for your other medical issues, concerns and or health care needs.   Signed: Armandina Stammer, NP, pmhnp, fnp-bc. 10/10/2022, 5:24 PM

## 2022-10-10 NOTE — Progress Notes (Signed)
   10/10/22 0600  15 Minute Checks  Location Dayroom  Visual Appearance Calm  Behavior Composed  Sleep (Behavioral Health Patients Only)  Calculate sleep? (Click Yes once per 24 hr at 0600 safety check) Yes  Documented sleep last 24 hours 5.75

## 2022-10-10 NOTE — Progress Notes (Signed)
  [facility] Adult Case Management Discharge Plan :  Will you be returning to the same living situation after discharge:  Yes,  mother and brother At discharge, do you have transportation home?: Yes,  family Do you have the ability to pay for your medications: Yes,  insurance  Release of information consent forms completed and emailed to Medical Records, then turned in to Medical Records by CSW.   Patient to Follow up at:  Follow-up Information     [facility], [facility]. Go to.   Why: Please follow up with your ACTT for ongoing mental health care. Contact information: [address] [place] [address] [phone]                 Next level of care provider has access to [facility] Link:no  Safety Planning and Suicide Prevention discussed: Yes,  with sister     Has patient been referred to the Quitline?: Patient refused referral  Patient has been referred for addiction treatment: Yes  Lynnell Chad, LCSW 10/10/2022, 12:01 PM

## 2022-10-10 NOTE — Progress Notes (Signed)
    10/09/22 2040  Psych Admission Type (Psych Patients Only)  Admission Status Involuntary  Psychosocial Assessment  Patient Complaints Anxiety  Eye Contact Fair  Facial Expression Animated  Affect Appropriate to circumstance  Speech Soft  Interaction Assertive  Motor Activity Pacing  Appearance/Hygiene Unremarkable  Behavior Characteristics Cooperative  Mood Depressed  Thought Process  Coherency WDL  Content WDL  Delusions None reported or observed  Perception WDL  Hallucination None reported or observed  Judgment WDL  Confusion Mild  Danger to Self  Current suicidal ideation? Denies  Agreement Not to Harm Self Yes  Description of Agreement verbal  Danger to Others  Danger to Others None reported or observed

## 2022-11-18 ENCOUNTER — Ambulatory Visit (HOSPITAL_COMMUNITY)
Admission: EM | Admit: 2022-11-18 | Discharge: 2022-11-18 | Disposition: A | Discharge status: Home / Self Care | Payer: 59 | Attending: Psychiatry | Admitting: Psychiatry

## 2022-11-18 DIAGNOSIS — F209 Schizophrenia, unspecified: Secondary | ICD-10-CM | POA: Diagnosis present

## 2022-11-18 DIAGNOSIS — F32A Depression, unspecified: Secondary | ICD-10-CM | POA: Insufficient documentation

## 2022-11-18 NOTE — Progress Notes (Addendum)
   11/18/22 1029  [facility] Triage Screening (Walk-ins at [facility] only)  How Did You Hear About [place]? Other (Comment)  What Is the Reason for Your Visit/Call Today? Pt presents to [facility] voluntarily accompanied by his ACTT team worker due to manic behavior. Pt's ACTT team worker reports the pt has been aggressive towards his family members and has been without his medication (Lithium). Pt reports he does not know why he was brought to this facility, but he is tired and would like to get some rest. Pt denies SI/HI and AVH.  How Long Has This Been Causing You Problems? <Week  Have You Recently Had Any Thoughts About Hurting Yourself? No  Are You Planning to Commit Suicide/Harm Yourself At This time? No  Have you Recently Had Thoughts About Hurting Someone Karolee Ohs? No  Are You Planning To Harm Someone At This Time? No  Are you currently experiencing any auditory, visual or other hallucinations? No  Have You Used Any Alcohol or Drugs in the Past 24 Hours? No  Do you have any current medical co-morbidities that require immediate attention? No  Clinician description of patient physical appearance/behavior: appears to be sleepy  (yawning), calm and cooperative  What Do You Feel Would Help You the Most Today? Social Support;Treatment for Depression or other mood problem  If access to [facility] was not available, would you have sought care in the Emergency Department? No  Determination of Need Routine (7 days)  Options For Referral Outpatient Therapy;Medication Management

## 2022-11-18 NOTE — Discharge Instructions (Addendum)
Please follow up with your ACTT team for continued behavioral health services.  Patient is instructed prior to discharge to: Take all medications as prescribed by his/her mental healthcare provider. Report any adverse effects and or reactions from the medicines to his/her outpatient provider promptly. Keep all scheduled appointments, to ensure that you are getting refills on time and to avoid any interruption in your medication.  If you are unable to keep an appointment call to reschedule.  Be sure to follow-up with resources and follow-up appointments provided.  Patient has been instructed & cautioned: To not engage in alcohol and or illegal drug use while on prescription medicines. In the event of worsening symptoms, patient is instructed to call the crisis hotline, 911 and or go to the nearest ED for appropriate evaluation and treatment of symptoms. To follow-up with his/her primary care provider for your other medical issues, concerns and or health care needs.  Information: -National Suicide Prevention Lifeline 1-800-SUICIDE or 504 121 6121.  -988 offers 24/7 access to trained crisis counselors who can help people experiencing mental health-related distress. People can call or text 988 or chat 988lifeline.org for themselves or if they are worried about a loved one who may need crisis support.
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
## 2023-01-13 ENCOUNTER — Ambulatory Visit (HOSPITAL_COMMUNITY)
Admission: EM | Admit: 2023-01-13 | Discharge: 2023-01-13 | Disposition: A | Discharge status: Home / Self Care | Payer: 59 | Attending: Nurse Practitioner | Admitting: Nurse Practitioner

## 2023-01-13 DIAGNOSIS — Z008 Encounter for other general examination: Secondary | ICD-10-CM

## 2023-01-13 DIAGNOSIS — F209 Schizophrenia, unspecified: Secondary | ICD-10-CM | POA: Insufficient documentation

## 2023-01-13 DIAGNOSIS — Z638 Other specified problems related to primary support group: Secondary | ICD-10-CM | POA: Insufficient documentation

## 2023-01-13 DIAGNOSIS — F17210 Nicotine dependence, cigarettes, uncomplicated: Secondary | ICD-10-CM | POA: Diagnosis not present

## 2023-01-13 NOTE — Discharge Instructions (Addendum)

## 2023-01-13 NOTE — Progress Notes (Signed)
   01/13/23 0207  [facility] Triage Screening (Walk-ins at [facility] only)  What Is the Reason for Your Visit/Call Today? Pt presents to [facility] voluntarily, accompanied by [employer] due to being "too high". Pt is talking exccessively, rapping and laughing inappropriately. Per [employer], pt was picked up on [employer] and behaviors were observed to be very odd. Pt entered the building without shirt and socks upon arrival. Pt reports using meth and alcohol last night. Pt stated diagnosis of schizophrenia, but is not currently taking medications at this time. Pt denies SI,HI,AVH.  How Long Has This Been Causing You Problems? <Week  Have You Recently Had Any Thoughts About Hurting Yourself? No  Are You Planning to Commit Suicide/Harm Yourself At This time? No  Have you Recently Had Thoughts About Hurting Someone Karolee Ohs? No  Are You Planning To Harm Someone At This Time? No  Are you currently experiencing any auditory, visual or other hallucinations? No  Have You Used Any Alcohol or Drugs in the Past 24 Hours? Yes  How long ago did you use Drugs or Alcohol? last night  What Did You Use and How Much? meth and alcohol (unknown quantity)  Do you have any current medical co-morbidities that require immediate attention? No  Clinician description of patient physical appearance/behavior: pt is laughing inappropriately, rapping lyrics to songs and talking excessively. Pt is cooperative and can be redirected  What Do You Feel Would Help You the Most Today? Treatment for Depression or other mood problem;Medication(s)  If access to [facility] was not available, would you have sought care in the Emergency Department? Yes  Determination of Need Routine (7 days)  Options For Referral Other: Comment;Outpatient Therapy;Medication Management
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
## 2023-03-09 ENCOUNTER — Encounter (HOSPITAL_COMMUNITY): Payer: Self-pay | Admitting: Psychiatry

## 2023-03-09 ENCOUNTER — Other Ambulatory Visit: Payer: Self-pay

## 2023-03-09 ENCOUNTER — Inpatient Hospital Stay (HOSPITAL_COMMUNITY)
Admission: AD | Admit: 2023-03-09 | Discharge: 2023-04-09 | DRG: 885 | Disposition: A | Discharge status: Home / Self Care | Payer: No Typology Code available for payment source | Source: Intra-hospital | Attending: Psychiatry | Admitting: Psychiatry

## 2023-03-09 ENCOUNTER — Ambulatory Visit (HOSPITAL_COMMUNITY)
Admission: EM | Admit: 2023-03-09 | Discharge: 2023-03-09 | Disposition: A | Discharge status: Other Acute Inpatient Hospital | Payer: Medicare Other | Attending: Psychiatry | Admitting: Psychiatry

## 2023-03-09 DIAGNOSIS — Z79899 Other long term (current) drug therapy: Secondary | ICD-10-CM | POA: Diagnosis not present

## 2023-03-09 DIAGNOSIS — F32A Depression, unspecified: Secondary | ICD-10-CM | POA: Diagnosis not present

## 2023-03-09 DIAGNOSIS — F22 Delusional disorders: Secondary | ICD-10-CM | POA: Diagnosis present

## 2023-03-09 DIAGNOSIS — F1010 Alcohol abuse, uncomplicated: Secondary | ICD-10-CM | POA: Diagnosis present

## 2023-03-09 DIAGNOSIS — R45850 Homicidal ideations: Secondary | ICD-10-CM | POA: Insufficient documentation

## 2023-03-09 DIAGNOSIS — Z91148 Patient's other noncompliance with medication regimen for other reason: Secondary | ICD-10-CM | POA: Diagnosis not present

## 2023-03-09 DIAGNOSIS — F14150 Cocaine abuse with cocaine-induced psychotic disorder with delusions: Secondary | ICD-10-CM | POA: Diagnosis present

## 2023-03-09 DIAGNOSIS — F19959 Other psychoactive substance use, unspecified with psychoactive substance-induced psychotic disorder, unspecified: Secondary | ICD-10-CM | POA: Diagnosis present

## 2023-03-09 DIAGNOSIS — F250 Schizoaffective disorder, bipolar type: Principal | ICD-10-CM | POA: Diagnosis present

## 2023-03-09 DIAGNOSIS — F17210 Nicotine dependence, cigarettes, uncomplicated: Secondary | ICD-10-CM | POA: Diagnosis present

## 2023-03-09 DIAGNOSIS — Z23 Encounter for immunization: Secondary | ICD-10-CM

## 2023-03-09 DIAGNOSIS — F1210 Cannabis abuse, uncomplicated: Secondary | ICD-10-CM | POA: Diagnosis present

## 2023-03-09 DIAGNOSIS — R45851 Suicidal ideations: Secondary | ICD-10-CM | POA: Diagnosis not present

## 2023-03-09 DIAGNOSIS — R030 Elevated blood-pressure reading, without diagnosis of hypertension: Secondary | ICD-10-CM | POA: Diagnosis not present

## 2023-03-09 DIAGNOSIS — G4700 Insomnia, unspecified: Secondary | ICD-10-CM | POA: Diagnosis present

## 2023-03-09 DIAGNOSIS — F419 Anxiety disorder, unspecified: Secondary | ICD-10-CM | POA: Diagnosis present

## 2023-03-09 DIAGNOSIS — F15150 Other stimulant abuse with stimulant-induced psychotic disorder with delusions: Secondary | ICD-10-CM | POA: Diagnosis present

## 2023-03-09 LAB — COMPREHENSIVE METABOLIC PANEL
ALT: 24 U/L (ref 0–44)
AST: 33 U/L (ref 15–41)
Albumin: 3.9 g/dL (ref 3.5–5.0)
Alkaline Phosphatase: 65 U/L (ref 38–126)
Anion gap: 9 (ref 5–15)
BUN: 12 mg/dL (ref 6–20)
CO2: 26 mmol/L (ref 22–32)
Calcium: 9.1 mg/dL (ref 8.9–10.3)
Chloride: 106 mmol/L (ref 98–111)
Creatinine, Ser: 0.73 mg/dL (ref 0.61–1.24)
GFR, Estimated: >60 mL/min (ref >60)
Glucose, Bld: 90 mg/dL (ref 70–99)
Potassium: 4 mmol/L (ref 3.5–5.1)
Sodium: 141 mmol/L (ref 135–145)
Total Bilirubin: 0.8 mg/dL (ref 0.3–1.2)
Total Protein: 7 g/dL (ref 6.5–8.1)

## 2023-03-09 LAB — POCT URINE DRUG SCREEN - MANUAL ENTRY (I-SCREEN)
POC Amphetamine UR: POSITIVE — AB
POC Amphetamine UR: NOT DETECTED
POC Buprenorphine (BUP): NOT DETECTED
POC Buprenorphine (BUP): NOT DETECTED
POC Cocaine UR: NOT DETECTED
POC Cocaine UR: NOT DETECTED
POC Marijuana UR: NOT DETECTED
POC Marijuana UR: NOT DETECTED
POC Methadone UR: POSITIVE — AB
POC Methadone UR: NOT DETECTED
POC Methamphetamine UR: NOT DETECTED
POC Methamphetamine UR: POSITIVE — AB
POC Morphine: NOT DETECTED
POC Morphine: NOT DETECTED
POC Oxazepam (BZO): NOT DETECTED
POC Oxazepam (BZO): NOT DETECTED
POC Oxycodone UR: NOT DETECTED
POC Oxycodone UR: NOT DETECTED
POC Secobarbital (BAR): NOT DETECTED
POC Secobarbital (BAR): NOT DETECTED

## 2023-03-09 LAB — CBC WITH DIFFERENTIAL/PLATELET
Abs Immature Granulocytes: 0.01 10*3/uL (ref 0.00–0.07)
Basophils Absolute: 0 10*3/uL (ref 0.0–0.1)
Basophils Relative: 1 %
Eosinophils Absolute: 0.2 10*3/uL (ref 0.0–0.5)
Eosinophils Relative: 4 %
HCT: 36.3 % — ABNORMAL LOW (ref 39.0–52.0)
Hemoglobin: 11.4 g/dL — ABNORMAL LOW (ref 13.0–17.0)
Immature Granulocytes: 0 %
Lymphocytes Relative: 18 %
Lymphs Abs: 0.9 10*3/uL (ref 0.7–4.0)
MCH: 25.6 pg — ABNORMAL LOW (ref 26.0–34.0)
MCHC: 31.4 g/dL (ref 30.0–36.0)
MCV: 81.4 fL (ref 80.0–100.0)
Monocytes Absolute: 0.5 10*3/uL (ref 0.1–1.0)
Monocytes Relative: 10 %
Neutro Abs: 3.1 10*3/uL (ref 1.7–7.7)
Neutrophils Relative %: 67 %
Platelets: 211 10*3/uL (ref 150–400)
RBC: 4.46 MIL/uL (ref 4.22–5.81)
RDW: 13.6 % (ref 11.5–15.5)
WBC: 4.7 10*3/uL (ref 4.0–10.5)
nRBC: 0 % (ref 0.0–0.2)

## 2023-03-09 LAB — URINALYSIS, COMPLETE (UACMP) WITH MICROSCOPIC
Bilirubin Urine: NEGATIVE
Glucose, UA: NEGATIVE mg/dL
Ketones, ur: NEGATIVE mg/dL
Leukocytes,Ua: NEGATIVE
Nitrite: NEGATIVE
Protein, ur: 30 mg/dL — AB
Specific Gravity, Urine: 1.024 (ref 1.005–1.030)
pH: 6 (ref 5.0–8.0)

## 2023-03-09 LAB — LITHIUM LEVEL: Lithium Lvl: <0.06 mmol/L — ABNORMAL LOW (ref 0.60–1.20)

## 2023-03-09 LAB — MAGNESIUM: Magnesium: 1.8 mg/dL (ref 1.7–2.4)

## 2023-03-09 LAB — ETHANOL: Alcohol, Ethyl (B): <10 mg/dL (ref <10)

## 2023-03-09 MED ORDER — LOPERAMIDE HCL 2 MG PO CAPS: 2.0000 mg | ORAL_CAPSULE | ORAL | Status: DC | PRN

## 2023-03-09 MED ORDER — HYDROXYZINE HCL 25 MG PO TABS: 25.0000 mg | ORAL_TABLET | Freq: Three times a day (TID) | ORAL | Status: DC | PRN

## 2023-03-09 MED ORDER — OLANZAPINE 5 MG PO TABS
5.0000 mg | ORAL_TABLET | Freq: Two times a day (BID) | ORAL | Status: DC
  Administered 2023-03-09: 5 mg via ORAL
  Filled 2023-03-09 (×4): qty 1

## 2023-03-09 MED ORDER — NAPROXEN 500 MG PO TABS: 500.0000 mg | ORAL_TABLET | Freq: Two times a day (BID) | ORAL | Status: DC | PRN

## 2023-03-09 MED ORDER — HYDROXYZINE HCL 25 MG PO TABS: 25.0000 mg | ORAL_TABLET | Freq: Four times a day (QID) | ORAL | Status: DC | PRN

## 2023-03-09 MED ORDER — ZIPRASIDONE MESYLATE 20 MG IM SOLR
20.0000 mg | INTRAMUSCULAR | Status: DC | PRN
  Administered 2023-03-11: 20 mg via INTRAMUSCULAR
  Filled 2023-03-09: qty 20

## 2023-03-09 MED ORDER — ZIPRASIDONE MESYLATE 20 MG IM SOLR: 20.0000 mg | INTRAMUSCULAR | Status: DC | PRN

## 2023-03-09 MED ORDER — TRAZODONE HCL 50 MG PO TABS
50.0000 mg | ORAL_TABLET | Freq: Every evening | ORAL | Status: DC | PRN
  Administered 2023-03-10: 50 mg via ORAL
  Filled 2023-03-09 (×2): qty 1

## 2023-03-09 MED ORDER — MAGNESIUM HYDROXIDE 400 MG/5ML PO SUSP: 30.0000 mL | Freq: Every day | ORAL | Status: DC | PRN

## 2023-03-09 MED ORDER — LORAZEPAM 1 MG PO TABS
1.0000 mg | ORAL_TABLET | ORAL | Status: AC | PRN
  Administered 2023-03-09: 1 mg via ORAL
  Filled 2023-03-09: qty 1

## 2023-03-09 MED ORDER — ONDANSETRON 4 MG PO TBDP: 4.0000 mg | ORAL_TABLET | Freq: Four times a day (QID) | ORAL | Status: DC | PRN

## 2023-03-09 MED ORDER — ACETAMINOPHEN 325 MG PO TABS: 650.0000 mg | ORAL_TABLET | Freq: Four times a day (QID) | ORAL | Status: DC | PRN

## 2023-03-09 MED ORDER — ONDANSETRON 4 MG PO TBDP: 4.0000 mg | ORAL_TABLET | Freq: Four times a day (QID) | ORAL | Status: AC | PRN

## 2023-03-09 MED ORDER — ALUM & MAG HYDROXIDE-SIMETH 200-200-20 MG/5ML PO SUSP: 30.0000 mL | ORAL | Status: DC | PRN

## 2023-03-09 MED ORDER — ACETAMINOPHEN 325 MG PO TABS
650.0000 mg | ORAL_TABLET | Freq: Four times a day (QID) | ORAL | Status: DC | PRN
  Administered 2023-03-22 – 2023-04-06 (×2): 650 mg via ORAL
  Filled 2023-03-09 (×2): qty 2

## 2023-03-09 MED ORDER — OLANZAPINE 10 MG PO TBDP
10.0000 mg | ORAL_TABLET | Freq: Three times a day (TID) | ORAL | Status: DC | PRN
  Administered 2023-03-09: 10 mg via ORAL
  Filled 2023-03-09: qty 1

## 2023-03-09 MED ORDER — DICYCLOMINE HCL 20 MG PO TABS: 20.0000 mg | ORAL_TABLET | Freq: Four times a day (QID) | ORAL | Status: DC | PRN

## 2023-03-09 MED ORDER — TRAZODONE HCL 50 MG PO TABS: 50.0000 mg | ORAL_TABLET | Freq: Every evening | ORAL | Status: DC | PRN

## 2023-03-09 MED ORDER — OLANZAPINE 5 MG PO TABS: 5.0000 mg | ORAL_TABLET | Freq: Two times a day (BID) | ORAL | Status: DC

## 2023-03-09 MED ORDER — HYDROXYZINE HCL 25 MG PO TABS
25.0000 mg | ORAL_TABLET | Freq: Three times a day (TID) | ORAL | Status: DC | PRN
  Administered 2023-03-10 – 2023-03-14 (×4): 25 mg via ORAL
  Filled 2023-03-09 (×4): qty 1

## 2023-03-09 MED ORDER — METHOCARBAMOL 500 MG PO TABS: 500.0000 mg | ORAL_TABLET | Freq: Three times a day (TID) | ORAL | Status: DC | PRN

## 2023-03-09 MED ORDER — OLANZAPINE 5 MG PO TABS
5.0000 mg | ORAL_TABLET | Freq: Two times a day (BID) | ORAL | Status: DC
  Administered 2023-03-10: 5 mg via ORAL
  Filled 2023-03-09 (×6): qty 1

## 2023-03-09 MED ORDER — OLANZAPINE 10 MG PO TBDP
10.0000 mg | ORAL_TABLET | Freq: Three times a day (TID) | ORAL | Status: DC | PRN
  Filled 2023-03-09: qty 1
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
## 2023-03-09 NOTE — ED Notes (Signed)
Spoke to lab about add on lithium level. They report they can add it on from the red top tube that was sent. No need for repeat draw.

## 2023-03-09 NOTE — Plan of Care (Signed)
  Problem: Activity: Goal: Sleeping patterns will improve Outcome: Progressing   Problem: Safety: Goal: Periods of time without injury will increase Outcome: Progressing   
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
## 2023-03-09 NOTE — ED Notes (Signed)
Updated POCT UDS entered as initial results circled incorrectly. Second UDS updated as results read. NP notified and aware.

## 2023-03-09 NOTE — ED Notes (Signed)
Pt escorted to [employer] , ambulatory. All belongings sent with pt.

## 2023-03-09 NOTE — Discharge Instructions (Addendum)
Transfer to [facility] for IP admission, Dr. Octavia Bruckner is accepting MD

## 2023-03-09 NOTE — ED Notes (Signed)
Pt con't to rest quietly, with even respirations.
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
## 2023-03-09 NOTE — Group Note (Signed)
Date:  03/09/2023 Time:  8:27 PM  Group Topic/Focus:  Wrap-Up Group:   The focus of this group is to help patients review their daily goal of treatment and discuss progress on daily workbooks.    Participation Level:  Did Not Attend  Participation Quality:   Did Not Attend  Affect:  Did Not Attend  Cognitive:  Did Not Attend  Insight: None  Engagement in Group:  Did Not Attend  Modes of Intervention:  Did Not Attend  Additional Comments:  Pt was encouraged to attend wrap up group but did not attend.  [name] 03/09/2023, 8:27 PM

## 2023-03-09 NOTE — Plan of Care (Signed)
  Problem: Education: Goal: Knowledge of [facility] Education information/materials will improve Outcome: Progressing Goal: Emotional status will improve Outcome: Progressing Goal: Mental status will improve Outcome: Progressing Goal: Verbalization of understanding the information provided will improve Outcome: Progressing   

## 2023-03-09 NOTE — ED Notes (Signed)
At 0915 courier was called for stat lab pick up for this pt. As of this writing, noted lab has not been picked up. Amanda MHT called again at 1108 to notify that stat labs have not been picked up. Courier made aware again.

## 2023-03-09 NOTE — Progress Notes (Addendum)
Admission Note:  Patient was brought IVC by [employer] from [facility] to [facility] due to acute psychosis with bizarre behavior and hypersexuality. 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 Patient arrived after having received agitation protocol and was drowsy and unsteady. Patient is responding to internal stimuli, singing and rhyming, but calm and cooperative with admission process. Denies SI, HI and AVH but reports "I'm not taking that disgusting medicine." And "I need a long sleeve shirt or I will stab something."  Patient belongings were searched and secured in locker. Patient's clothing and skin were assessed with Mia, MHT. Notable for blisters on the bottom of feet. Patient was provided packet. Patient signed all forms, was oriented to unit, provided meal and went to sleep. 15 minute safety checks were initiated.

## 2023-03-09 NOTE — ED Notes (Signed)
Pt is resting quietly. Respirations even and unlabored.

## 2023-03-09 NOTE — Tx Team (Signed)
Initial Treatment Plan 03/09/2023 2:27 PM [name] [name] ZOX:[id number]    PATIENT STRESSORS: Other: don't want to be here.     PATIENT STRENGTHS: Supportive family/friends    PATIENT IDENTIFIED PROBLEMS: "I'm not taking those disgusting medicines"  "I don't want to be here it's too cold                   DISCHARGE CRITERIA:  Adequate post-discharge living arrangements Improved stabilization in mood, thinking, and/or behavior  PRELIMINARY DISCHARGE PLAN: Outpatient therapy  PATIENT/FAMILY INVOLVEMENT: This treatment plan has been presented to and reviewed with the patient, [name] [name].  The patient has been given the opportunity to ask questions and make suggestions.  Karn Pickler, RN 03/09/2023, 2:27 PM

## 2023-03-09 NOTE — Progress Notes (Signed)
Pt has been accepted to [facility] TODAY 03/09/2023, pending labs, EKG, and IVC paperwork faxed to [phone]. Bed assignment: 501-1  Pt meets inpatient criteria per Vernard Gambles, NP  Attending Physician will be Phineas Inches, MD  Report can be called to: - Adult unit: [id number]  Pt can arrive after pending items are received  Care Team Notified: [facility] Rona Ravens, RN, Karn Pickler, RN, Lorella Nimrod, RN, Vernard Gambles, NP, and Malva Limes, RN  Cathie Beams, LCSW  03/09/2023 10:01 AM

## 2023-03-09 NOTE — Progress Notes (Signed)
Pt resting quietly after prn agitation doses given. Pt with sheet over head with even respirations.

## 2023-03-09 NOTE — ED Notes (Addendum)
Pt admitted to Continuous assessment while awaiting inpatient bed. Pt upon entry to [facility] is acutely psychotic , brought in IVC via [employer]. Pt is disorganized with pressured speech. 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 '' Pt was able to be redirected and given po medications for agitation/psychosis and accepted orally with no issue. Pt compliant with admission process and work up, while drawing blood pt chanting in another language and humming continuously. Pt skin searched and belongings searched which revealed old blisters callouses to bottom of his feet. During EKG pt laughing continuously and responding to internal stimuli. He has been given two sandwiches and consumed both and drank juice. Pt is able to follow verbal redirection but due to his hypersexual behaviors has been placed in FLEX area. Pt is safe. NP order to reschedule scheduled dose of zyprexa as pt just received prn dose from agitation protocol. IVC paperwork copies placed in chart.

## 2023-03-09 NOTE — Progress Notes (Signed)
Pt recognized writer from prior admissions, pt was seen talking to people not visible to staff     03/09/23 2115  Psych Admission Type (Psych Patients Only)  Admission Status Involuntary  Psychosocial Assessment  Patient Complaints Suspiciousness;Restlessness  Eye Contact Fair  Facial Expression Flat  Affect Preoccupied  Speech Logical/coherent  Interaction Cautious;Avoidant  Motor Activity Pacing;Fidgety  Appearance/Hygiene Disheveled  Behavior Characteristics Cooperative;Pacing  Mood Preoccupied;Suspicious  Aggressive Behavior  Effect No apparent injury  Thought Process  Coherency Circumstantial;Disorganized  Content Blaming others  Delusions Paranoid  Perception Hallucinations  Hallucination Auditory  Judgment Impaired  Confusion Mild  Danger to Self  Current suicidal ideation? Denies  Danger to Others  Danger to Others None reported or observed

## 2023-03-09 NOTE — ED Notes (Signed)
Report called to [place], Charity fundraiser. Also called [employer] for [employer] transport to [facility].

## 2023-03-10 ENCOUNTER — Encounter (HOSPITAL_COMMUNITY): Payer: Self-pay

## 2023-03-10 DIAGNOSIS — F250 Schizoaffective disorder, bipolar type: Principal | ICD-10-CM

## 2023-03-10 MED ORDER — OLANZAPINE 7.5 MG PO TABS
15.0000 mg | ORAL_TABLET | Freq: Every day | ORAL | Status: DC
  Administered 2023-03-10 – 2023-03-22 (×13): 15 mg via ORAL
  Filled 2023-03-10 (×15): qty 2

## 2023-03-10 MED ORDER — LITHIUM CARBONATE 300 MG PO CAPS
300.0000 mg | ORAL_CAPSULE | Freq: Two times a day (BID) | ORAL | Status: DC
  Administered 2023-03-10 – 2023-03-13 (×6): 300 mg via ORAL
  Filled 2023-03-10 (×10): qty 1

## 2023-03-10 MED ORDER — PALIPERIDONE ER 6 MG PO TB24
6.0000 mg | ORAL_TABLET | Freq: Every day | ORAL | Status: DC
  Administered 2023-03-10 – 2023-03-11 (×2): 6 mg via ORAL
  Filled 2023-03-10 (×5): qty 1

## 2023-03-10 MED ORDER — OLANZAPINE 5 MG PO TABS
5.0000 mg | ORAL_TABLET | Freq: Every day | ORAL | Status: DC
  Administered 2023-03-11 – 2023-03-23 (×13): 5 mg via ORAL
  Filled 2023-03-10 (×15): qty 1

## 2023-03-10 NOTE — Progress Notes (Signed)
Pt requires frequent redirection to stay in room and to stop the loud talking to himself in room.

## 2023-03-10 NOTE — Progress Notes (Signed)
Recreation Therapy Notes  Patient admitted to unit 9.3.24. Due to admission within last year, no new recreation therapy assessment conducted at this time. Last assessment conducted on 4.2.24.    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 Patient needed a lot of redirection due to getting off topic and entering writer's personal space.  Reason for current admission per patient, "I don't know".  Patient reports wanting to leave and needing a cigarette as stressors for current admission.  Patient identified coping skills as deep breathing, talk, smoke cigarettes and walking  Patient reports goal of "get out fast as possible and smoke cigarettes".  Patient rates SI as a 1 and contracts for safety but denies HI and AVH.   Information found below from assessment conducted 4.2.24.  Coping Skills: Isolation,TV, Sports, Arguments, Aggression, Music, Exercise, Deep Breathing, Meditate, Substance Abuse, Talk, Art, Prayer, Avoidance, Read, Hot Bath/Shower  Leisure Interests: Listen to music, Social with family, Smoke, Plan family vacations and Think about family  Patient Strengths: Sports, Cooperative, Good Behavior, Happy, Find something to do/action packed, Active  Areas of Improvement: Talk therapy, Money management, Focus on wife/daughter     [name] [name], LRT,CTRS [name] [name] 03/10/2023 1:00 PM

## 2023-03-10 NOTE — Group Note (Signed)
Date:  03/10/2023 Time:  1:31 PM  Group Topic/Focus:  Emotional Education:   The focus of this group is to discuss what feelings/emotions are, and how they are experienced.    Participation Level:  Minimal  Participation Quality:  Inattentive  Affect:  Anxious  Cognitive:  Disorganized and Delusional  Insight: None  Engagement in Group:  Distracting  Modes of Intervention:  Limit-setting  Additional Comments:  Pt was asked to leave group after being disruptive, singing aloud and talking to himself while others were participating.   Donell Beers 03/10/2023, 1:31 PM

## 2023-03-10 NOTE — H&P (Signed)
Psychiatric Admission Assessment Adult  Patient Identification: [name] [name]  MRN:  [id number]  Date of Evaluation:  03/10/2023  Chief Complaint:  Substance-induced psychotic disorder [facility]) [F19.959]  Principal Diagnosis: Schizoaffective disorder, bipolar type (HCC)  Diagnosis:  Principal Problem:   Schizoaffective disorder, bipolar type (HCC) Active Problems:   Substance-induced psychotic disorder (HCC)  History of Present Illness: (Per IVC report): Patient placed under IVC by [employer]/[facility] [place]e, "[name] has been diagnosed with schizophrenia, the respondent has been hallucinating he believes he is God and the devil.  [name] is having disorganized and delusional thoughts for example he believes a kitten kissed him while in the back of the patrol car and he believed he was crying tears of blood which was not the case.  In addition the respondent was running around the parking lot in a shopping center swinging a sword, stating he would kill them.  [name] swung a sword at officers stating he would kill them."   Upon this [facility] admission: During this evaluation, [name] [name] is observed actively having a conversation with himself. He was sitting in the day room at the time. He presents alert & oriented to himself. He is approachable. He presents delusional, highly psychotic & a poor historian. He is unable make any concrete statements. He is currently unable to identify the reasons that led to his current hospitals. He is unable to remember his previous medications taken by him. He reports.   "The police took me to the the place I was before I came here on September, 3rd. I don't know why. I have been hurting too much. Please tell them to not make me walk too much. I have been walking everywhere. Everybody is making me do it.. I need cigarettes. I take my medicines, but I forget sometimes. I don't use drugs, I smoke cigarettes".   Per Act team reports: Patient is linked with [place] the  (envision of  life act team).The envision of life staff reports that patient has been under the care of the [facility]. However, reports that patient has not been able to receive his monthly Invega shots that was due on 01-14-23 because he was incarcerated.  The Act team reports that patient's current medications include; Zyprexa 20 mg Q hs, Lithium carbonate 300 mg bid & the Invega 456 mg that he never got. Patient was apparently on two separate antipsychotic medications for his mental health issues.  Associated Signs/Symptoms:  Depression Symptoms:  insomnia, psychomotor agitation, difficulty concentrating, impaired memory,  (Hypo) Manic Symptoms:  Delusions, Distractibility, Flight of Ideas, Hallucinations, Impulsivity, Labiality of Mood,  Anxiety Symptoms:   Patient is noted pacing up & down the hall way.  Psychotic Symptoms:   Patient is noted talking to   PTSD Symptoms: NA  Total Time spent with patient: 1 hour  Past Psychiatric History: (Per previous assessment): [facility] admissions x multiple times [facility] hospitalizations: 02/28/2020, 01/17/2019 for psychosis, 11/16/2018, 10/30/2018, 07/25/2018,respectively). Patient is linked to the [facility] Act Team. (Per previous assessment): Paranoid schizophrenia, anxiety disorder, major depression, stimulant use disorder, Schizoaffective disorder, cannabis use disorder. Has extensive history of aggression as per chart review especially when under the influence of drugs.  Is the patient at risk to self? No.  Has the patient been a risk to self in the past 6 months? No.  Has the patient been a risk to self within the distant past? No.  Is the patient a risk to others? Yes.    Has  the patient been a risk to others in the past 6 months? No.  Has the patient been a risk to others within the distant past? Yes.     [place] Scale:  Flowsheet Row [facility] (Current[facility] Most  recent reading at 03/09/2023  2:00 PM ED from 03/09/2023 in [facility] Most recent reading at 03/09/2023  9:44 AM ED from 01/13/2023 in [facility] Most recent reading at 01/13/2023  2:16 AM  C-SSRS RISK CATEGORY No Risk No Risk No Risk      Prior Inpatient Therapy: Yes.   If yes, describe: [facility] x multiple times.   Prior Outpatient Therapy: Yes.   If yes, describe: [facility] Act team.   Alcohol Screening: 1. How often do you have a drink containing alcohol?: Never 2. How many drinks containing alcohol do you have on a typical day when you are drinking?: 1 or 2 3. How often do you have six or more drinks on one occasion?: Never AUDIT-C Score: 0 4. How often during the last year have you found that you were not able to stop drinking once you had started?: Never 5. How often during the last year have you failed to do what was normally expected from you because of drinking?: Never 6. How often during the last year have you needed a first drink in the morning to get yourself going after a heavy drinking session?: Never 7. How often during the last year have you had a feeling of guilt of remorse after drinking?: Never 8. How often during the last year have you been unable to remember what happened the night before because you had been drinking?: Never 9. Have you or someone else been injured as a result of your drinking?: No 10. Has a relative or friend or a doctor or another health worker been concerned about your drinking or suggested you cut down?: No Alcohol Use Disorder Identification Test Final Score (AUDIT): 0  Substance Abuse History in the last 12 months:  Yes.    Consequences of Substance Abuse: Discussed with patient during this admission evaluation. Medical Consequences:  Liver damage, Possible death by overdose Legal Consequences:  Arrests, jail time, Loss of driving privilege. Family Consequences:  Family discord, divorce  and or separation.  Previous Psychotropic Medications:  Invega injections, Zyprexa.  Psychological Evaluations: No   Past Medical History:  Past Medical History:  Diagnosis Date   Anxiety    Depression    Mental disorder    Schizophrenia (HCC)    History reviewed. No pertinent surgical history.  Family History:  Family History  Problem Relation Age of Onset   Mental illness Neg Hx    Family Psychiatric  History: Patient currently presents as a poor historian due to psychosis/mania. Below information is obtained via chart review: Patient reports that he lives with his mother and brother, states that he has 1 child and is divorced.  States that his child is 30 years old.  Reports highest level of education as being high school.  States that he has a GED.  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   Tobacco Screening:  Social History   Tobacco Use  Smoking Status Every Day   Current packs/day: 1.00   Types: Cigarettes  Smokeless Tobacco Former    BH Tobacco Counseling     Are you interested in Tobacco Cessation Medications?  No, patient  refused Counseled patient on smoking cessation:  Refused/Declined practical counseling Reason Tobacco Screening Not Completed: Patient Refused Screening       Social History: Patient is currently unable to provide this information due to mental disorganization. This information is obtained per chart review: Patient reports that he lives with his mother and brother, states that he has 1 child and is divorced. States that his child is 46 years old.  Reports highest level of education as being high school.  States that he has a GED.  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  Social History   Substance and Sexual Activity  Alcohol Use Not Currently   Comment: haven't drank in months"     Social History   Substance and Sexual Activity  Drug Use Yes   Types: Marijuana, Methamphetamines   Comment:  Per sister, Pt uses meth    Additional Social History:  Allergies:  No Known Allergies Lab Results:  Results for orders placed or performed during the hospital encounter of 03/09/23 (from the past 48 hour(s))  CBC with Differential/Platelet     Status: Abnormal   Collection Time: 03/09/23  9:15 AM  Result Value Ref Range   WBC 4.7 4.0 - 10.5 K/uL   RBC 4.46 4.22 - 5.81 MIL/uL   Hemoglobin 11.4 (L) 13.0 - 17.0 g/dL   HCT 52.8 (L) 41.3 - 24.4 %   MCV 81.4 80.0 - 100.0 fL   MCH 25.6 (L) 26.0 - 34.0 pg   MCHC 31.4 30.0 - 36.0 g/dL   RDW 01.0 27.2 - 53.6 %   Platelets 211 150 - 400 K/uL   nRBC 0.0 0.0 - 0.2 %   Neutrophils Relative % 67 %   Neutro Abs 3.1 1.7 - 7.7 K/uL   Lymphocytes Relative 18 %   Lymphs Abs 0.9 0.7 - 4.0 K/uL   Monocytes Relative 10 %   Monocytes Absolute 0.5 0.1 - 1.0 K/uL   Eosinophils Relative 4 %   Eosinophils Absolute 0.2 0.0 - 0.5 K/uL   Basophils Relative 1 %   Basophils Absolute 0.0 0.0 - 0.1 K/uL   Immature Granulocytes 0 %   Abs Immature Granulocytes 0.01 0.00 - 0.07 K/uL    Comment: Performed at [facility], [address], [place], [place] [address]  Comprehensive metabolic panel     Status: None   Collection Time: 03/09/23  9:15 AM  Result Value Ref Range   Sodium 141 135 - 145 mmol/L   Potassium 4.0 3.5 - 5.1 mmol/L   Chloride 106 98 - 111 mmol/L   CO2 26 22 - 32 mmol/L   Glucose, Bld 90 70 - 99 mg/dL    Comment: Glucose reference range applies only to samples taken after fasting for at least 8 hours.   BUN 12 6 - 20 mg/dL   Creatinine, Ser 4.74 0.61 - 1.24 mg/dL   Calcium 9.1 8.9 - 25.9 mg/dL   Total Protein 7.0 6.5 - 8.1 g/dL   Albumin 3.9 3.5 - 5.0 g/dL   AST 33 15 - 41 U/L   ALT 24 0 - 44 U/L   Alkaline Phosphatase 65 38 - 126 U/L   Total Bilirubin 0.8 0.3 - 1.2 mg/dL   GFR, Estimated >56 >38 mL/min    Comment: (NOTE) Calculated using the CKD-EPI Creatinine Equation (2021)    Anion gap 9 5 - 15    Comment: Performed at  [facility], [address], [place], [place] [address]  Ethanol     Status: None   Collection Time: 03/09/23  9:15 AM  Result Value Ref Range   Alcohol, Ethyl (B) <10 <10 mg/dL    Comment: (NOTE) Lowest detectable limit for serum alcohol is 10 mg/dL.  For medical purposes only. Performed at [facility], [address], [place], [place] [address]   Magnesium     Status: None   Collection Time: 03/09/23  9:15 AM  Result Value Ref Range   Magnesium 1.8 1.7 - 2.4 mg/dL    Comment: Performed at [facility], [address] [place], [place] [address]  Urinalysis, Complete w Microscopic -Urine, Clean Catch     Status: Abnormal   Collection Time: 03/09/23  9:15 AM  Result Value Ref Range   Color, Urine YELLOW YELLOW   APPearance CLEAR CLEAR   Specific Gravity, Urine 1.024 1.005 - 1.030   pH 6.0 5.0 - 8.0   Glucose, UA NEGATIVE NEGATIVE mg/dL   Hgb urine dipstick SMALL (A) NEGATIVE   Bilirubin Urine NEGATIVE NEGATIVE   Ketones, ur NEGATIVE NEGATIVE mg/dL   Protein, ur 30 (A) NEGATIVE mg/dL   Nitrite NEGATIVE NEGATIVE   Leukocytes,Ua NEGATIVE NEGATIVE   RBC / HPF 11-20 0 - 5 RBC/hpf   WBC, UA 0-5 0 - 5 WBC/hpf   Bacteria, UA RARE (A) NONE SEEN   Squamous Epithelial / HPF 0-5 0 - 5 /HPF   Mucus PRESENT     Comment: Performed at [facility], [address] [place], [place] [address]  POCT Urine Drug Screen - (I-Screen)     Status: Abnormal   Collection Time: 03/09/23  9:15 AM  Result Value Ref Range   POC Amphetamine UR Positive (A) NONE DETECTED (Cut Off Level 1000 ng/mL)   POC Secobarbital (BAR) None Detected NONE DETECTED (Cut Off Level 300 ng/mL)   POC Buprenorphine (BUP) None Detected NONE DETECTED (Cut Off Level 10 ng/mL)   POC Oxazepam (BZO) None Detected NONE DETECTED (Cut Off Level 300 ng/mL)   POC Cocaine UR None Detected NONE DETECTED (Cut Off Level 300 ng/mL)   POC Methamphetamine UR None Detected NONE DETECTED (Cut Off Level 1000 ng/mL)    POC Morphine None Detected NONE DETECTED (Cut Off Level 300 ng/mL)   POC Methadone UR Positive (A) NONE DETECTED (Cut Off Level 300 ng/mL)   POC Oxycodone UR None Detected NONE DETECTED (Cut Off Level 100 ng/mL)   POC Marijuana UR None Detected NONE DETECTED (Cut Off Level 50 ng/mL)  Lithium level     Status: Abnormal   Collection Time: 03/09/23 10:09 AM  Result Value Ref Range   Lithium Lvl <0.06 (L) 0.60 - 1.20 mmol/L    Comment: Performed at [facility], [address], [place], [place] [address]  POCT Urine Drug Screen - (I-Screen)     Status: Abnormal   Collection Time: 03/09/23 10:20 AM  Result Value Ref Range   POC Amphetamine UR None Detected NONE DETECTED (Cut Off Level 1000 ng/mL)   POC Secobarbital (BAR) None Detected NONE DETECTED (Cut Off Level 300 ng/mL)   POC Buprenorphine (BUP) None Detected NONE DETECTED (Cut Off Level 10 ng/mL)   POC Oxazepam (BZO) None Detected NONE DETECTED (Cut Off Level 300 ng/mL)   POC Cocaine UR None Detected NONE DETECTED (Cut Off Level 300 ng/mL)   POC Methamphetamine UR Positive (A) NONE DETECTED (Cut Off Level 1000 ng/mL)   POC Morphine None  Detected NONE DETECTED (Cut Off Level 300 ng/mL)   POC Methadone UR None Detected NONE DETECTED (Cut Off Level 300 ng/mL)   POC Oxycodone UR None Detected NONE DETECTED (Cut Off Level 100 ng/mL)   POC Marijuana UR None Detected NONE DETECTED (Cut Off Level 50 ng/mL)   Blood Alcohol level:  Lab Results  Component Value Date   ETH <10 03/09/2023   ETH <10 10/05/2022   Metabolic Disorder Labs:  Lab Results  Component Value Date   HGBA1C 5.7 (H) 10/07/2022   MPG 117 10/07/2022   MPG 114 10/05/2022   Lab Results  Component Value Date   PROLACTIN 17.4 10/05/2022   PROLACTIN 45.8 (H) 12/19/2017   Lab Results  Component Value Date   CHOL 156 10/05/2022   TRIG 116 10/05/2022   HDL 40 (L) 10/05/2022   CHOLHDL 3.9 10/05/2022   VLDL 23 10/05/2022   LDLCALC 93 10/05/2022   LDLCALC 69  11/21/2020   Current Medications: Current Facility-Administered Medications  Medication Dose Route Frequency Provider Last Rate Last Admin   acetaminophen (TYLENOL) tablet 650 mg  650 mg Oral Q6H PRN Ardis Hughs, NP       alum & mag hydroxide-simeth (MAALOX/MYLANTA) 200-200-20 MG/5ML suspension 30 mL  30 mL Oral Q4H PRN Ardis Hughs, NP       hydrOXYzine (ATARAX) tablet 25 mg  25 mg Oral TID PRN Ardis Hughs, NP   25 mg at 03/10/23 0102   magnesium hydroxide (MILK OF MAGNESIA) suspension 30 mL  30 mL Oral Daily PRN Ardis Hughs, NP       OLANZapine (ZYPREXA) tablet 5 mg  5 mg Oral BID Ardis Hughs, NP   5 mg at 03/10/23 0625   OLANZapine zydis (ZYPREXA) disintegrating tablet 10 mg  10 mg Oral Q8H PRN Ardis Hughs, NP       And   ziprasidone (GEODON) injection 20 mg  20 mg Intramuscular PRN Ardis Hughs, NP       ondansetron (ZOFRAN-ODT) disintegrating tablet 4 mg  4 mg Oral Q6H PRN Ardis Hughs, NP       traZODone (DESYREL) tablet 50 mg  50 mg Oral QHS PRN Ardis Hughs, NP       PTA Medications: Medications Prior to Admission  Medication Sig Dispense Refill Last Dose   INVEGA TRINZA 546 MG/1.75ML injection Inject 546 mg into the muscle every 3 (three) months. (Patient not taking: Reported on 03/09/2023)      lithium 300 MG tablet Take 300 mg by mouth daily. (Patient not taking: Reported on 03/09/2023)      OLANZapine (ZYPREXA) 20 MG tablet Take 1 tablet (20 mg total) by mouth at bedtime. For mood control (Patient not taking: Reported on 03/09/2023) 30 tablet 0    OLANZapine (ZYPREXA) 5 MG tablet Take 1 tablet (5 mg total) by mouth 2 (two) times daily.      Musculoskeletal: Strength & Muscle Tone: within normal limits Gait & Station: normal Patient leans: N/A  Psychiatric Specialty Exam:  Presentation  General Appearance:  Casual  Eye Contact: Fleeting  Speech: Pressured  Speech Volume: Increased  Handedness: Right   Mood  and Affect  Mood: Labile (psychotic, responing to internal stimuli.)  Affect: Congruent; Labile  Thought Process  Thought Processes: Disorganized  Duration of Psychotic Symptoms: Greater than two weeks.  Past Diagnosis of Schizophrenia or Psychoactive disorder: Yes  Descriptions of Associations:Tangential  Orientation:Partial  Thought Content:Illogical; Tangential; Scattered  Hallucinations:Hallucinations:  Auditory Description of Auditory Hallucinations: Patient is responding to internal stimuli (talking to himself loudly & answering to himself).  Ideas of Reference:Delusions  Suicidal Thoughts:Suicidal Thoughts: -- (Unsure, patient is unable to provide this information.)  Homicidal Thoughts:Homicidal Thoughts: Yes, Passive HI Passive Intent and/or Plan: With Intent; With Plan; With Means to Carry Out  Sensorium  Memory: Immediate Fair; Recent Poor; Remote Poor  Judgment: Impaired  Insight: Lacking  Executive Functions  Concentration: Poor  Attention Span: Poor  Recall: Poor  Fund of Knowledge: Poor  Language: Fair  Psychomotor Activity  Psychomotor Activity: Psychomotor Activity: Increased  Assets  Assets: Social Support; Resilience; Physical Health; Financial Resources/Insurance; Desire for Improvement  Sleep  Sleep: Sleep: Poor Number of Hours of Sleep: 4.2  Physical Exam: Physical Exam Vitals and nursing note reviewed.  HENT:     Head: Normocephalic.     Mouth/Throat:     Pharynx: Oropharynx is clear.  Cardiovascular:     Rate and Rhythm: Normal rate.     Pulses: Normal pulses.  Pulmonary:     Effort: Pulmonary effort is normal.  Genitourinary:    Comments: Deferred Musculoskeletal:        General: Normal range of motion.     Cervical back: Normal range of motion.  Skin:    General: Skin is warm and dry.  Neurological:     Mental Status: He is alert.     Comments: Oriented to self & place.   Review of Systems   Constitutional:  Positive for diaphoresis. Negative for chills and fever.  HENT:  Negative for congestion and sore throat.   Respiratory:  Negative for cough, shortness of breath and wheezing.   Cardiovascular:  Negative for chest pain and palpitations.  Gastrointestinal:  Negative for abdominal pain, constipation, diarrhea, heartburn, nausea and vomiting.  Musculoskeletal:  Negative for joint pain and myalgias.  Skin:  Negative for itching and rash.  Neurological:  Negative for dizziness, tingling, tremors, sensory change, speech change, focal weakness, seizures, loss of consciousness, weakness and headaches.  Endo/Heme/Allergies:        NKDA  Psychiatric/Behavioral:  Positive for hallucinations, memory loss (Pt presents with an impaired memory at this time.) and substance abuse. Negative for depression and suicidal ideas. The patient is nervous/anxious and has insomnia.    Blood pressure 123/89, pulse 82, temperature (!) 97.5 F (36.4 C), temperature source Oral, resp. rate 16, height 5\' 3"  (1.6 m), weight 70 kg, SpO2 98%. Body mass index is 27.35 kg/m.  Treatment Plan Summary: Daily contact with patient to assess and evaluate symptoms and progress in treatment and Medication management.   Principal/active diagnoses. Schizoaffective disorder, bipolar type.  Substance induced mood disorder.  Stimulant use disorder.  Plan: The risks/benefits/side-effects/alternatives to the medications in use were discussed in detail with the patient and time was given for patient's questions. The patient consents to medication trial.   -Continue Zyprexa 5 mg po q daily for mood instability.  -Initiated Lithium carbonate 300 mg po bid for mood stabilization. -Initiated Zyprexa 15 mg po Q hs for psychosis.  -Continue hydroxyzine 25 mg po tid prn for anxiety.  -Continue Trazodone 50 mg po Q hs prn for insomnia,  -Agitation protocols: Cont as recommended;  Olanzapine zydis 10 mg po tid prn for  agitation/psychosis.  -& -Geodon 20 mg IM prn for agitation,  Other PRNS -Continue Tylenol 650 mg every 6 hours PRN for mild pain -Continue Maalox 30 ml Q 4 hrs PRN for indigestion -Continue MOM 30  ml po Q 6 hrs for constipation.  -Continue Zofran-ODT 4 mg po Q 6 hrs prn for N/V.  Safety and Monitoring: Voluntary admission to inpatient psychiatric unit for safety, stabilization and treatment Daily contact with patient to assess and evaluate symptoms and progress in treatment Patient's case to be discussed in multi-disciplinary team meeting Observation Level : q15 minute checks Vital signs: q12 hours Precautions: Safety  Discharge Planning: Social work and case management to assist with discharge planning and identification of hospital follow-up needs prior to discharge Estimated LOS: 5-7 days Discharge Concerns: Need to establish a safety plan; Medication compliance and effectiveness Discharge Goals: Return home with outpatient referrals for mental health follow-up including medication management/psychotherapy  Observation Level/Precautions:  15 minute checks  Laboratory:   Per ED, current lab results reviewed, will obtain lipid panel, hgba1c.  Psychotherapy:    Medications: See MAR.  Consultations:  As needed.  Discharge Concerns: Safety, mood stability.  Estimated LOS: 7 days.  Other: Safety, mood stability.    Physician Treatment Plan for Primary Diagnosis: Schizoaffective disorder, bipolar type (HCC).  Long Term Goal(s): Improvement in symptoms so as ready for discharge  Short Term Goals: Ability to identify changes in lifestyle to reduce recurrence of condition will improve, Ability to verbalize feelings will improve, Ability to disclose and discuss suicidal ideas, and Ability to demonstrate self-control will improve  Physician Treatment Plan for Secondary Diagnosis: Principal Problem:   Schizoaffective disorder, bipolar type (HCC) Active Problems:   Substance-induced  psychotic disorder (HCC)  Long Term Goal(s): Improvement in symptoms so as ready for discharge  Short Term Goals: Ability to identify and develop effective coping behaviors will improve, Ability to maintain clinical measurements within normal limits will improve, Compliance with prescribed medications will improve, and Ability to identify triggers associated with substance abuse/mental health issues will improve  I certify that inpatient services furnished can reasonably be expected to improve the patient's condition.    Armandina Stammer, NP, pmhno, fnp-bc. 9/4/20241:15 PM

## 2023-03-10 NOTE — Progress Notes (Signed)
Pt taking a shower. Did not take sleep medication due to his paranoia. Frequent redirection.

## 2023-03-10 NOTE — Progress Notes (Signed)
   03/10/23 0555  15 Minute Checks  Location Bedroom  Visual Appearance Calm  Behavior Composed  Sleep (Behavioral Health Patients Only)  Calculate sleep? (Click Yes once per 24 hr at 0600 safety check) Yes  Documented sleep last 24 hours 4.5

## 2023-03-10 NOTE — Progress Notes (Signed)
   03/10/23 1000  Psych Admission Type (Psych Patients Only)  Admission Status Involuntary  Psychosocial Assessment  Patient Complaints Irritability;Restlessness;Suspiciousness  Eye Contact Fair;Staring  Facial Expression Flat  Affect Labile;Preoccupied  Speech Logical/coherent;Rhyming  Interaction Avoidant;Cautious  Motor Activity Fidgety;Pacing  Appearance/Hygiene Disheveled  Behavior Characteristics Cooperative;Pacing  Mood Labile;Suspicious;Preoccupied  Thought Process  Coherency Circumstantial;Disorganized  Content Blaming others  Delusions Paranoid  Perception Hallucinations  Hallucination Auditory  Judgment Impaired  Confusion Mild  Danger to Self  Current suicidal ideation? Denies  Danger to Others  Danger to Others None reported or observed

## 2023-03-10 NOTE — Progress Notes (Addendum)
Pt observed pacing the hallways rapping/singing to self throughout shift. Pt appears to be rhyming while singing/rapping.  Pt brought back to unit from cafeteria at lunchtime due to disruptive behavior. Pt was standing at the cafeteria door singing/rapping very loudly. Pt was offered PRN and refused.

## 2023-03-10 NOTE — [facility] Suicide Risk Assessment (Signed)
Suicide Risk Assessment  Admission Assessment    [facility] Admission Suicide Risk Assessment   Nursing information obtained from:     Demographic factors:  Male, Low socioeconomic status  Current Mental Status:  NA  Loss Factors:  NA  Historical Factors:  Impulsivity  Risk Reduction Factors:  Sense of responsibility to family  Total Time spent with patient: 1.5 hours  Principal Problem: Substance-induced psychotic disorder (HCC)  Diagnosis:  Principal Problem:   Substance-induced psychotic disorder (HCC)  Subjective Data: "I don't know why I'm here".  Continued Clinical Symptoms: Has psychosis and anxiety, requires continuous hospitalization for treatment and stabilization.  Alcohol Use Disorder Identification Test Final Score (AUDIT): 0 The "Alcohol Use Disorders Identification Test", Guidelines for Use in Primary Care, Second Edition.  World Science writer [facility] Score between 0-7:  no or low risk or alcohol related problems. Score between 8-15:  moderate risk of alcohol related problems. Score between 16-19:  high risk of alcohol related problems. Score 20 or above:  warrants further diagnostic evaluation for alcohol dependence and treatment.  CLINICAL FACTORS:   Schizophrenia:   Paranoid or undifferentiated type  Musculoskeletal: Strength & Muscle Tone: within normal limits Gait & Station: normal Patient leans: N/A  Psychiatric Specialty Exam:  Presentation  General Appearance:  Casual  Eye Contact: Fleeting  Speech: Pressured  Speech Volume: Increased  Handedness: Right   Mood and Affect  Mood: Labile (psychotic, responing to internal stimuli.)  Affect: Congruent; Labile  Thought Process  Thought Processes: Disorganized  Descriptions of Associations:Tangential  Orientation:Partial  Thought Content:Illogical; Tangential; Scattered  History of Schizophrenia/Schizoaffective disorder:Yes  Duration of Psychotic Symptoms:Greater than six  months  Hallucinations:Hallucinations: Auditory Description of Auditory Hallucinations: Patient is responding to internal stimuli (talking to himself loudly & answering to himself).  Ideas of Reference:Delusions  Suicidal Thoughts:Suicidal Thoughts: -- (Unsure, patient is unable to provide this information.)  Homicidal Thoughts:Homicidal Thoughts: Yes, Passive HI Passive Intent and/or Plan: With Intent; With Plan; With Means to Carry Out   Sensorium  Memory: Immediate Fair; Recent Poor; Remote Poor  Judgment: Impaired  Insight: Lacking   Executive Functions  Concentration: Poor  Attention Span: Poor  Recall: Poor  Fund of Knowledge: Poor  Language: Fair  Psychomotor Activity  Psychomotor Activity: Psychomotor Activity: Increased  Assets  Assets: Social Support; Resilience; Physical Health; Financial Resources/Insurance; Desire for Improvement  Sleep  Sleep: Sleep: Poor Number of Hours of Sleep: 4.2  Physical Exam: Physical Exam Constitutional:      Appearance: Normal appearance.  HENT:     Head: Normocephalic.     Mouth/Throat:     Pharynx: Oropharynx is clear.  Pulmonary:     Effort: Pulmonary effort is normal.  Genitourinary:    Comments: Deferred Musculoskeletal:        General: Normal range of motion.     Cervical back: Normal range of motion.  Skin:    General: Skin is warm.  Neurological:     Mental Status: He is alert.     Comments: Alert & oriented x self & place.    Review of Systems  Constitutional:  Negative for chills and fever.  HENT:  Negative for congestion and sore throat.   Eyes:  Negative for blurred vision.  Respiratory:  Negative for cough, shortness of breath and wheezing.   Cardiovascular:  Negative for chest pain and palpitations.  Gastrointestinal:  Negative for abdominal pain, diarrhea, heartburn, nausea and vomiting.  Genitourinary:  Negative for dysuria.  Musculoskeletal:  Negative for joint  pain and  myalgias.  Skin:  Negative for itching and rash.  Neurological:  Negative for dizziness, tingling, tremors, sensory change, speech change, focal weakness, seizures, loss of consciousness, weakness and headaches.  Endo/Heme/Allergies:        NKDA  Psychiatric/Behavioral:  Positive for depression and hallucinations. Negative for memory loss, substance abuse and suicidal ideas. The patient is nervous/anxious and has insomnia.    Blood pressure 123/89, pulse 82, temperature (!) 97.5 F (36.4 C), temperature source Oral, resp. rate 16, height 5\' 3"  (1.6 m), weight 70 kg, SpO2 98%. Body mass index is 27.35 kg/m.  COGNITIVE FEATURES THAT CONTRIBUTE TO RISK:  None    SUICIDE RISK:   Mild:  Suicidal ideation of limited frequency, intensity, duration, and specificity.  There are no identifiable plans, no associated intent, mild dysphoria and related symptoms, good self-control (both objective and subjective assessment), few other risk factors, and identifiable protective factors, including available and accessible social support.  PLAN OF CARE: See H&P.  I certify that inpatient services furnished can reasonably be expected to improve the patient's condition.   Armandina Stammer, NP, pmhnp, fnp-bc. 03/10/2023, 12:03 PM

## 2023-03-10 NOTE — BH IP Treatment Plan (Signed)
Interdisciplinary Treatment and Diagnostic Plan Initial  03/10/2023 Time of Session: 1110 [name] [name] MRN: [id number]  Principal Diagnosis: Substance-induced psychotic disorder [facility])  Secondary Diagnoses: Principal Problem:   Substance-induced psychotic disorder (HCC)   Current Medications:  Current Facility-Administered Medications  Medication Dose Route Frequency Provider Last Rate Last Admin   acetaminophen (TYLENOL) tablet 650 mg  650 mg Oral Q6H PRN Ardis Hughs, NP       alum & mag hydroxide-simeth (MAALOX/MYLANTA) 200-200-20 MG/5ML suspension 30 mL  30 mL Oral Q4H PRN Ardis Hughs, NP       hydrOXYzine (ATARAX) tablet 25 mg  25 mg Oral TID PRN Ardis Hughs, NP   25 mg at 03/10/23 8469   magnesium hydroxide (MILK OF MAGNESIA) suspension 30 mL  30 mL Oral Daily PRN Ardis Hughs, NP       OLANZapine (ZYPREXA) tablet 5 mg  5 mg Oral BID Ardis Hughs, NP   5 mg at 03/10/23 0625   OLANZapine zydis (ZYPREXA) disintegrating tablet 10 mg  10 mg Oral Q8H PRN Ardis Hughs, NP       And   ziprasidone (GEODON) injection 20 mg  20 mg Intramuscular PRN Ardis Hughs, NP       ondansetron (ZOFRAN-ODT) disintegrating tablet 4 mg  4 mg Oral Q6H PRN Ardis Hughs, NP       traZODone (DESYREL) tablet 50 mg  50 mg Oral QHS PRN Ardis Hughs, NP       PTA Medications: Medications Prior to Admission  Medication Sig Dispense Refill Last Dose   INVEGA TRINZA 546 MG/1.75ML injection Inject 546 mg into the muscle every 3 (three) months. (Patient not taking: Reported on 03/09/2023)      lithium 300 MG tablet Take 300 mg by mouth daily. (Patient not taking: Reported on 03/09/2023)      OLANZapine (ZYPREXA) 20 MG tablet Take 1 tablet (20 mg total) by mouth at bedtime. For mood control (Patient not taking: Reported on 03/09/2023) 30 tablet 0    OLANZapine (ZYPREXA) 5 MG tablet Take 1 tablet (5 mg total) by mouth 2 (two) times daily.       Patient Stressors: Other:  don't want to be here.    Patient Strengths: Supportive family/friends   Treatment Modalities: Medication Management, Group therapy, Case management,  1 to 1 session with clinician, Psychoeducation, Recreational therapy.   Physician Treatment Plan for Primary Diagnosis: Substance-induced psychotic disorder (HCC) Long Term Goal(s):     Short Term Goals:    Medication Management: Evaluate patient's response, side effects, and tolerance of medication regimen.  Therapeutic Interventions: 1 to 1 sessions, Unit Group sessions and Medication administration.  Evaluation of Outcomes: Progressing  Physician Treatment Plan for Secondary Diagnosis: Principal Problem:   Substance-induced psychotic disorder (HCC)  Long Term Goal(s):     Short Term Goals:       Medication Management: Evaluate patient's response, side effects, and tolerance of medication regimen.  Therapeutic Interventions: 1 to 1 sessions, Unit Group sessions and Medication administration.  Evaluation of Outcomes: Progressing   RN Treatment Plan for Primary Diagnosis: Substance-induced psychotic disorder (HCC) Long Term Goal(s): Knowledge of disease and therapeutic regimen to maintain health will improve  Short Term Goals: Ability to remain free from injury will improve, Ability to verbalize frustration and anger appropriately will improve, Ability to demonstrate self-control, Ability to participate in decision making will improve, Ability to verbalize feelings will improve, Ability to disclose and discuss suicidal  ideas, Ability to identify and develop effective coping behaviors will improve, and Compliance with prescribed medications will improve  Medication Management: RN will administer medications as ordered by provider, will assess and evaluate patient's response and provide education to patient for prescribed medication. RN will report any adverse and/or side effects to prescribing provider.  Therapeutic  Interventions: 1 on 1 counseling sessions, Psychoeducation, Medication administration, Evaluate responses to treatment, Monitor vital signs and CBGs as ordered, Perform/monitor CIWA, COWS, AIMS and Fall Risk screenings as ordered, Perform wound care treatments as ordered.  Evaluation of Outcomes: Progressing   LCSW Treatment Plan for Primary Diagnosis: Substance-induced psychotic disorder [facility]) Long Term Goal(s): Safe transition to appropriate next level of care at discharge, Engage patient in therapeutic group addressing interpersonal concerns.  Short Term Goals: Engage patient in aftercare planning with referrals and resources, Increase social support, Increase ability to appropriately verbalize feelings, Increase emotional regulation, Facilitate acceptance of mental health diagnosis and concerns, Facilitate patient progression through stages of change regarding substance use diagnoses and concerns, Identify triggers associated with mental health/substance abuse issues, and Increase skills for wellness and recovery  Therapeutic Interventions: Assess for all discharge needs, 1 to 1 time with Social worker, Explore available resources and support systems, Assess for adequacy in community support network, Educate family and significant other(s) on suicide prevention, Complete Psychosocial Assessment, Interpersonal group therapy.  Evaluation of Outcomes: Progressing   Progress in Treatment: Attending groups: Yes. Participating in groups: Yes. Taking medication as prescribed: Yes. Toleration medication: Yes. Family/Significant other contact made: No, will contact:  Pt declined Consent Patient understands diagnosis: No. Discussing patient identified problems/goals with staff: Yes. Medical problems stabilized or resolved: Yes. Denies suicidal/homicidal ideation: No. Issues/concerns per patient self-inventory: Yes. Other: N/A  New problem(s) identified: No, Describe:  None Reported  New Short  Term/Long Term Goal(s): medication stabilization, elimination of SI thoughts, development of comprehensive mental wellness plan.     Patient Goals:  "I need to get out of this place"  Discharge Plan or Barriers: :  Patient recently admitted. CSW will continue to follow and assess for appropriate referrals and possible discharge planning.   Reason for Continuation of Hospitalization: Aggression Delusions  Hallucinations Homicidal ideation Medication stabilization Withdrawal symptoms  Estimated Length of Stay: 5-7 Days  Last 3 [place] Suicide Severity Risk Score: Flowsheet Row [facility]Current[facility] Most recent reading at 03/09/2023  2:00 PM ED from 03/09/2023 in [facility] Most recent reading at 03/09/2023  9:44 AM ED from 01/13/2023 in [facility] Most recent reading at 01/13/2023  2:16 AM  C-SSRS RISK CATEGORY No Risk No Risk No Risk       Last PHQ 2/9 Scores:     No data to display         detox, medication management for mood stabilization; elimination of SI thoughts; development of comprehensive mental wellness/sobriety plan   Scribe for Treatment Team: Ane Payment, LCSW 03/10/2023 1:14 PM

## 2023-03-10 NOTE — Progress Notes (Signed)
Adult Psychoeducational Group Note  Date:  03/10/2023 Time:  9:39 PM  Group Topic/Focus:  Wrap-Up Group:   The focus of this group is to help patients review their daily goal of treatment and discuss progress on daily workbooks.  Participation Level:  Minimal  Participation Quality:  Drowsy  Affect:  Appropriate  Cognitive:  Disorganized  Insight: Improving  Engagement in Group:  Lacking  Modes of Intervention:  Discussion  Additional Comments:  Pt stated he was tired.  Pt stated his goal for the day was to go outside and get some fresh air.  Pt met goal.  Lucilla Lame 03/10/2023, 9:39 PM

## 2023-03-10 NOTE — Plan of Care (Signed)
  Problem: Education: Goal: Emotional status will improve Outcome: Progressing Goal: Mental status will improve Outcome: Progressing   Problem: Activity: Goal: Interest or engagement in activities will improve Outcome: Progressing   Problem: Safety: Goal: Periods of time without injury will increase Outcome: Progressing

## 2023-03-10 NOTE — Progress Notes (Addendum)
Pt coming out his room being loud at times, pt stating "I'm a police officer , I will kill you" , pt cursing at staff , pt redirectable at this time, pt encouraged to not disturb peers

## 2023-03-10 NOTE — Group Note (Signed)
Recreation Therapy Group Note   Group Topic:Self-Esteem  Group Date: 03/10/2023 Start Time: 1015 End Time: 1050 Facilitators: Brown Dunlap-McCall, LRT,CTRS Location: 500 Hall Dayroom   Goal Area(s) Addresses:  Patient will successfully identify positive attributes about themselves.  Patient will identify healthy ways to increase self-esteem. Patient will acknowledge benefit(s) of improved self-esteem.   Group Description: Patients were given a worksheet in which they were to identify their strengths and qualities. As patients worked on [employer], they were also given the opportunity to request songs to be played during group session as long as the songs were clean and appropriate.  Education:  Self-Esteem, Discharge Planning  Education Outcome: Acknowledges education/In group clarification offered/Needs additional education   Affect/Mood: Anxious   Participation Level: Minimal   Participation Quality: Minimal Cues   Behavior: Restless   Speech/Thought Process: Delusional   Insight: Impaired   Judgement: Impaired   Modes of Intervention: Music and Worksheet   Patient Response to Interventions:  Challenging    Education Outcome:  In group clarification offered    Clinical Observations/Individualized Feedback: Pt was unable to sit still during group session. Pt was constantly moving around and in/out of group. Pt needed redirection to refocus, which pt did in short spurts. Pt was unable to identify any strengths or qualities about himself.     Plan: Continue to engage patient in RT group sessions 2-3x/week.   Izel Hochberg-McCall, LRT,CTRS 03/10/2023 12:06 PM

## 2023-03-10 NOTE — Progress Notes (Signed)
Pt took HS medications without issue, pt appeared less agitated this evening    03/10/23 2145  Psych Admission Type (Psych Patients Only)  Admission Status Involuntary  Psychosocial Assessment  Patient Complaints Restlessness  Eye Contact Fair  Facial Expression Flat  Affect Preoccupied  Speech Logical/coherent  Interaction Cautious;Avoidant  Motor Activity Pacing;Fidgety  Appearance/Hygiene Disheveled  Behavior Characteristics Anxious;Pacing  Mood Suspicious;Preoccupied  Aggressive Behavior  Effect No apparent injury  Thought Process  Coherency Circumstantial;Disorganized  Content Blaming others  Delusions Paranoid  Perception Hallucinations  Hallucination Auditory  Judgment Impaired  Confusion Mild  Danger to Self  Current suicidal ideation? Denies  Danger to Others  Danger to Others None reported or observed

## 2023-03-10 NOTE — Progress Notes (Addendum)
Pt continues to pace the hall, rapping, singing, and talking to self. Pt also changing his clothes multiple times. Pt offered PRN and refused.

## 2023-03-10 NOTE — Group Note (Signed)
Date:  03/10/2023 Time:  10:17 AM  Group Topic/Focus:  Goals Group:   The focus of this group is to help patients establish daily goals to achieve during treatment and discuss how the patient can incorporate goal setting into their daily lives to aide in recovery.    Participation Level:  Minimal  Participation Quality:  Appropriate  Affect:  Appropriate  Cognitive:  Lacking  Insight: Lacking  Engagement in Group:  Lacking  Modes of Intervention:  Discussion and Exploration  Additional Comments:  Pt said his goal is to "Go home" when asked how he can do that he didn't know  Donell Beers 03/10/2023, 10:17 AM

## 2023-03-11 DIAGNOSIS — F250 Schizoaffective disorder, bipolar type: Secondary | ICD-10-CM | POA: Diagnosis not present

## 2023-03-11 MED ORDER — LORAZEPAM 2 MG/ML IJ SOLN
2.0000 mg | Freq: Once | INTRAMUSCULAR | Status: AC
  Administered 2023-03-11: 2 mg via INTRAMUSCULAR
  Filled 2023-03-11: qty 1

## 2023-03-11 MED ORDER — OLANZAPINE 10 MG IM SOLR
5.0000 mg | Freq: Three times a day (TID) | INTRAMUSCULAR | Status: DC | PRN
  Administered 2023-03-12: 5 mg via INTRAMUSCULAR
  Filled 2023-03-11: qty 10

## 2023-03-11 MED ORDER — DIPHENHYDRAMINE HCL 50 MG/ML IJ SOLN
50.0000 mg | Freq: Three times a day (TID) | INTRAMUSCULAR | Status: DC | PRN
  Administered 2023-03-12: 50 mg via INTRAVENOUS
  Filled 2023-03-11: qty 1

## 2023-03-11 MED ORDER — INFLUENZA VIRUS VACC SPLIT PF (FLUZONE) 0.5 ML IM SUSY
0.5000 mL | PREFILLED_SYRINGE | INTRAMUSCULAR | Status: AC
  Administered 2023-03-12: 0.5 mL via INTRAMUSCULAR
  Filled 2023-03-11: qty 0.5

## 2023-03-11 MED ORDER — OLANZAPINE 5 MG PO TABS
5.0000 mg | ORAL_TABLET | Freq: Three times a day (TID) | ORAL | Status: DC | PRN
  Administered 2023-03-17 – 2023-03-26 (×3): 5 mg via ORAL
  Filled 2023-03-11 (×3): qty 1

## 2023-03-11 MED ORDER — DIPHENHYDRAMINE HCL 50 MG/ML IJ SOLN
50.0000 mg | Freq: Once | INTRAMUSCULAR | Status: AC
  Filled 2023-03-11: qty 1

## 2023-03-11 MED ORDER — DIPHENHYDRAMINE HCL 50 MG/ML IJ SOLN
INTRAMUSCULAR | Status: AC
  Administered 2023-03-11: 50 mg
  Filled 2023-03-11: qty 1

## 2023-03-11 MED ORDER — TRAZODONE HCL 100 MG PO TABS
100.0000 mg | ORAL_TABLET | Freq: Every day | ORAL | Status: DC
  Administered 2023-03-11: 100 mg via ORAL
  Filled 2023-03-11 (×4): qty 1

## 2023-03-11 MED ORDER — LORAZEPAM 1 MG PO TABS
1.0000 mg | ORAL_TABLET | Freq: Three times a day (TID) | ORAL | Status: DC | PRN
  Administered 2023-03-17 – 2023-03-26 (×3): 1 mg via ORAL
  Filled 2023-03-11 (×3): qty 1

## 2023-03-11 NOTE — Group Note (Signed)
Recreation Therapy Group Note   Group Topic:Healthy Decision Making  Group Date: 03/11/2023 Start Time: 1000 End Time: 1030 Facilitators: Shamila Lerch-McCall, LRT,CTRS Location: 500 Hall Dayroom   Goal Area(s) Addresses:  Patient will effectively work with peer towards shared goal.  Patient will identify factors that guided their decision making.  Patient will pro-socially communicate ideas during group session.   Intervention: Survival Scenario - pencil, paper  Group Description: Patients were given a scenario that they were going to be stranded on a deserted [place] for several months before being rescued. Writer tasked them with making a list of 15 things they would choose to bring with them for "survival". The list of items was prioritized most important to least. Each patient would come up with their own list, then work together to create a new list of 15 items while in a group of 3-5 peers. LRT discussed each person's list and how it differed from others. The debrief included discussion of priorities, good decisions versus bad decisions, and how it is important to think before acting so we can make the best decision possible. LRT tied the concept of effective communication among group members to patient's support systems outside of the hospital and its benefit post discharge.  Clinical Observations/Individualized Feedback: Group did not occur due to LRT completing yearly competencies for work role.    Plan: Continue to engage patient in RT group sessions 2-3x/week.   Rayon Mcchristian-McCall, LRT,CTRS 03/11/2023 12:14 PM

## 2023-03-11 NOTE — Progress Notes (Signed)
During wrap up group, Patient stated if he did not discharge tomorrow that he will murder someone. Patients nurse notified. Will continue to monitor.

## 2023-03-11 NOTE — Progress Notes (Signed)
   03/11/23 2045  Psych Admission Type (Psych Patients Only)  Admission Status Involuntary  Psychosocial Assessment  Patient Complaints Restlessness  Eye Contact Fair  Facial Expression Flat  Affect Preoccupied  Speech Logical/coherent  Interaction Cautious;Avoidant  Motor Activity Pacing;Fidgety  Appearance/Hygiene Disheveled  Behavior Characteristics Anxious;Pacing  Mood Suspicious;Preoccupied  Aggressive Behavior  Effect No apparent injury  Thought Process  Coherency Circumstantial;Disorganized  Content Blaming others  Delusions Paranoid  Perception Hallucinations  Hallucination Auditory  Judgment Impaired  Confusion Mild  Danger to Self  Current suicidal ideation? Denies  Danger to Others  Danger to Others None reported or observed

## 2023-03-11 NOTE — Progress Notes (Signed)
   03/11/23 0541  15 Minute Checks  Location Hallway  Visual Appearance Calm  Behavior Composed  Sleep (Behavioral Health Patients Only)  Calculate sleep? (Click Yes once per 24 hr at 0600 safety check) Yes  Documented sleep last 24 hours 8.75

## 2023-03-11 NOTE — Plan of Care (Signed)
  Problem: Education: Goal: Emotional status will improve Outcome: Progressing Goal: Mental status will improve Outcome: Progressing   Problem: Activity: Goal: Sleeping patterns will improve Outcome: Progressing   Problem: Coping: Goal: Ability to demonstrate self-control will improve Outcome: Progressing   Problem: Safety: Goal: Periods of time without injury will increase Outcome: Progressing

## 2023-03-11 NOTE — Group Note (Signed)
Date:  03/11/2023 Time:  1:13 PM  Group Topic/Focus:  Goals Group:   The focus of this group is to help patients establish daily goals to achieve during treatment and discuss how the patient can incorporate goal setting into their daily lives to aide in recovery.    Participation Level:  Did Not Attend   [name] [name] 03/11/2023, 1:13 PM

## 2023-03-11 NOTE — Group Note (Signed)
Occupational Therapy Group Note  Group Topic:Coping Skills  Group Date: 03/11/2023 Start Time: 1430 End Time: 1500 Facilitators: Ted Mcalpine, OT   Group Description: Group encouraged increased engagement and participation through discussion and activity focused on "Coping Ahead." Patients were split up into teams and selected a card from a stack of positive coping strategies. Patients were instructed to act out/charade the coping skill for other peers to guess and receive points for their team. Discussion followed with a focus on identifying additional positive coping strategies and patients shared how they were going to cope ahead over the weekend while continuing hospitalization stay.  Therapeutic Goal(s): Identify positive vs negative coping strategies. Identify coping skills to be used during hospitalization vs coping skills outside of hospital/at home Increase participation in therapeutic group environment and promote engagement in treatment   Participation Level: Did not attend                              Plan: Continue to engage patient in OT groups 2 - 3x/week.  03/11/2023  Ted Mcalpine, OT  Kerrin Champagne, OT

## 2023-03-11 NOTE — Progress Notes (Signed)
[facility] MD Progress Note  03/11/2023 4:08 PM Vaughn Bickers  MRN:  [id number]  Reason for admission: Patient placed under IVC by [employer] E[place] Noe, "[name] has been diagnosed with schizophrenia, the respondent has been hallucinating he believes he is God and the devil. [name] is having disorganized and delusional thoughts for example he believes a kitten kissed him while in the back of the patrol car and he believed he was crying tears of blood which was not the case. In addition the respondent was running around the parking lot in a shopping center swinging a sword, stating he would kill them.   Daily notes: Alastor is seen this morning. Chart reviewed. The chart findings discussed with the treatment team. He presents alert, oriented to name & place. He is verbally responsive, making a good eye contact. He is observed wearing a black bandana to his head while walking up & down the unit hall-way. He is approachable, however, remains disorganized, tangential & delusional. He is showing some mild clarity in his thinking pattern. He reports, "I'm good. I want to go home. I'm ready to go home. I have been here two days & the day I spent at the other place the police took me to on the first day. That is 3 days & it is enough. I need to go. Tell the doctor to come see me". Tziah was asked the reason his Act team could not locate him to give him his monthly injection in July because he was in jail? He replied, "Yes, I was in jail for 60 days. I went to the jail to bail someone out. 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 When I got there, the cops put me in jail instead & kept me there for 60 days". Eion remains delusional & some-what grandiose. He has been re-started on his medications. His Act Team indicated that the last time he received his last Invega injection was in April of 2024. His Invega tablet has been re-started. We will transition him to the injectable form  by discharge. This provider did reach out to the [facility] Act team to ask for a visit with patient to see if he is approaching his baseline. They agreed to come sometime tomorrow 03-12-23. Patient currently denies any SIHI, plans or intent. Staff reports that patient is not sleeping well at night. Has increased his Trazodone dose & changed to routine instead of prn. Patient is currently in no apparent distress. Discussed this case with the attending psychiatrist. Vitals signs reviewed. Blood pressure is elevated. Staff will recheck. Patient is in no apparent distress. See treatment plan including the agitation protocls.  Principal Problem: Schizoaffective disorder, bipolar type (HCC)  Diagnosis: Principal Problem:   Schizoaffective disorder, bipolar type (HCC) Active Problems:   Substance-induced psychotic disorder (HCC)  Total Time spent with patient: 45 minutes  Past Psychiatric History: See H&P.  Past Medical History:  Past Medical History:  Diagnosis Date   Anxiety    Depression    Mental disorder    Schizophrenia (HCC)    History reviewed. No pertinent surgical history. Family History:  Family History  Problem Relation Age of Onset   Mental illness Neg Hx    Family Psychiatric  History: See H&P.  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, Pt uses meth    Social History   Socioeconomic History   Marital status: Married    Spouse name: Not on file   Number of children: 1   Years of education: Not on file   Highest education level: Not on file  Occupational History   Occupation: Disabled  Tobacco Use   Smoking status: Every Day    Current packs/day: 1.00    Types: Cigarettes   Smokeless tobacco: Former  Building services engineer status: Unknown   Substance and Sexual Activity   Alcohol use: Not Currently    Comment: haven't drank in months"   Drug use: Yes    Types: Marijuana, Methamphetamines    Comment: 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 Pt lives with mother who IVC'd him.      Pt lives with parents and sister.  He is married, but currently separated.  Pt is on disability.   Social Determinants of Health   Financial Resource Strain: Not on file  Food Insecurity: Patient Unable To Answer (03/09/2023)   Hunger Vital Sign    Worried About Running Out of Food in the Last Year: Patient unable to answer    Ran Out of Food in the Last Year: Patient unable to answer  Transportation Needs: Patient Unable To Answer (03/09/2023)   PRAPARE - Administrator, Civil Service (Medical): Patient unable to answer    Lack of Transportation (Non-Medical): Patient unable to answer  Physical Activity: Not on file  Stress: Not on file  Social Connections: Not on file   Additional Social History:   Sleep: Poor  Appetite:  Good  Current Medications: Current Facility-Administered Medications  Medication Dose Route Frequency Provider Last Rate Last Admin   acetaminophen (TYLENOL) tablet 650 mg  650 mg Oral Q6H PRN Ardis Hughs, NP       alum & mag hydroxide-simeth (MAALOX/MYLANTA) 200-200-20 MG/5ML suspension 30 mL  30 mL Oral Q4H PRN Ardis Hughs, NP       OLANZapine (ZYPREXA) injection 5 mg  5 mg Intramuscular TID PRN Armandina Stammer I, NP       And   diphenhydrAMINE (BENADRYL) injection 50 mg  50 mg Intravenous TID PRN Armandina Stammer I, NP       hydrOXYzine (ATARAX) tablet 25 mg  25 mg Oral TID PRN Ardis Hughs, NP   25 mg at 03/10/23 0625   [START ON 03/12/2023] influenza vac split trivalent PF (FLULAVAL) injection 0.5 mL  0.5 mL Intramuscular  Tomorrow-1000 Chequita Mofield I, NP       lithium carbonate capsule 300 mg  300 mg Oral BID WC Delight Bickle I, NP   300 mg at 03/11/23 0832   OLANZapine (ZYPREXA) tablet 5 mg  5 mg Oral TID PRN Armandina Stammer I, NP       And   LORazepam (ATIVAN) tablet 1 mg  1 mg Oral TID PRN Armandina Stammer I, NP       magnesium hydroxide (MILK OF MAGNESIA) suspension 30 mL  30 mL Oral Daily PRN Ardis Hughs, NP       OLANZapine (ZYPREXA) tablet 15 mg  15 mg Oral QHS Armandina Stammer  I, NP   15 mg at 03/10/23 2015   OLANZapine (ZYPREXA) tablet 5 mg  5 mg Oral Daily Christe Tellez, Nicole Kindred I, NP   5 mg at 03/11/23 0832   ondansetron (ZOFRAN-ODT) disintegrating tablet 4 mg  4 mg Oral Q6H PRN Ardis Hughs, NP       paliperidone (INVEGA) 24 hr tablet 6 mg  6 mg Oral QHS Jameison Haji, Nicole Kindred I, NP   6 mg at 03/10/23 2015   traZODone (DESYREL) tablet 50 mg  50 mg Oral QHS PRN Ardis Hughs, NP   50 mg at 03/10/23 2015    Lab Results: No results found for this or any previous visit (from the past 48 hour(s)).  Blood Alcohol level:  Lab Results  Component Value Date   ETH <10 03/09/2023   ETH <10 10/05/2022    Metabolic Disorder Labs: Lab Results  Component Value Date   HGBA1C 5.7 (H) 10/07/2022   MPG 117 10/07/2022   MPG 114 10/05/2022   Lab Results  Component Value Date   PROLACTIN 17.4 10/05/2022   PROLACTIN 45.8 (H) 12/19/2017   Lab Results  Component Value Date   CHOL 156 10/05/2022   TRIG 116 10/05/2022   HDL 40 (L) 10/05/2022   CHOLHDL 3.9 10/05/2022   VLDL 23 10/05/2022   LDLCALC 93 10/05/2022   LDLCALC 69 11/21/2020    Physical Findings: AIMS:  , ,  ,  ,    CIWA:    COWS:     Musculoskeletal: Strength & Muscle Tone: within normal limits Gait & Station: normal Patient leans: N/A  Psychiatric Specialty Exam:  Presentation  General Appearance:  Casual  Eye Contact: Fleeting  Speech: Pressured  Speech Volume: Increased  Handedness: Right   Mood and Affect  Mood: Labile  (psychotic, responing to internal stimuli.)  Affect: Congruent; Labile  Thought Process  Thought Processes: Disorganized  Descriptions of Associations:Tangential  Orientation:Partial  Thought Content:Illogical; Tangential; Scattered  History of Schizophrenia/Schizoaffective disorder:Yes  Duration of Psychotic Symptoms:Greater than six months  Hallucinations:Hallucinations: Auditory Description of Auditory Hallucinations: Patient is responding to internal stimuli (talking to himself loudly & answering to himself).  Ideas of Reference:Delusions  Suicidal Thoughts:Suicidal Thoughts: -- (Unsure, patient is unable to provide this information.)  Homicidal Thoughts:No data recorded  Sensorium  Memory: Immediate Fair; Recent Poor; Remote Poor  Judgment: Impaired  Insight: Lacking  Executive Functions  Concentration: Poor  Attention Span: Poor  Recall: Poor  Fund of Knowledge: Poor  Language: Fair  Psychomotor Activity  Psychomotor Activity: Psychomotor Activity: Increased  Assets  Assets: Social Support; Resilience; Physical Health; Financial Resources/Insurance; Desire for Improvement  Sleep  Sleep: Sleep: Poor Number of Hours of Sleep: 4.2  Physical Exam: Physical Exam Vitals and nursing note reviewed.  HENT:     Nose: Nose normal.     Mouth/Throat:     Pharynx: Oropharynx is clear.  Cardiovascular:     Pulses: Normal pulses.     Comments: Blood pressure: elevated 141/94. Pulmonary:     Effort: Pulmonary effort is normal.  Musculoskeletal:        General: Normal range of motion.     Cervical back: Normal range of motion.  Skin:    General: Skin is warm.  Neurological:     General: No focal deficit present.     Mental Status: He is alert and oriented to person, place, and time.    Review of Systems  Constitutional:  Negative for chills, diaphoresis and fever.  HENT:  Negative for congestion and sore throat.   Respiratory:  Negative  for cough, shortness of breath and wheezing.   Cardiovascular:  Negative for chest pain and palpitations.  Gastrointestinal:  Negative for abdominal pain, constipation, diarrhea, heartburn, nausea and vomiting.  Musculoskeletal:  Negative for joint pain and myalgias.  Neurological:  Negative for dizziness, tingling, tremors, sensory change, speech change, focal weakness, seizures, loss of consciousness, weakness and headaches.  Psychiatric/Behavioral:  Positive for hallucinations and substance abuse. Negative for depression, memory loss and suicidal ideas. The patient is nervous/anxious and has insomnia.    Blood pressure (!) 141/94, pulse 98, temperature 98.5 F (36.9 C), temperature source Oral, resp. rate 18, height 5\' 3"  (1.6 m), weight 70 kg, SpO2 100%. Body mass index is 27.35 kg/m.  Treatment Plan Summary: Daily contact with patient to assess and evaluate symptoms and progress in treatment and Medication management.   Continue inpatient hospitalization.  Will continue today 03/11/2023 plan as below except where it is noted.   Principal/active diagnoses. Schizoaffective disorder, bipolar type.  Substance induced mood disorder.  Stimulant use disorder.  Plan: The risks/benefits/side-effects/alternatives to the medications in use were discussed in detail with the patient and time was given for patient's questions. The patient consents to medication trial.   -Continue Zyprexa 5 mg po q daily for mood instability.  -Continue Lithium carbonate 300 mg po bid for mood stabilization. -Continue Zyprexa 15 mg po Q hs for psychosis.  -Continue hydroxyzine 25 mg po tid prn for anxiety.  -Increased/changed Trazodone to 100 mg po Q hs for insomnia..  -Continue Invega 6 mg po Q bedtime for mood control.   Agitation protocols. -Olanzapine 5 mg po tid prn -& Lorazepam 1 mg po tid prn.   OR  -Olanzapine 5 mg IM tid prn  & Benadryl 50 mg IM tid prn.   Other PRNS -Continue Tylenol 650 mg  every 6 hours PRN for mild pain -Continue Maalox 30 ml Q 4 hrs PRN for indigestion -Continue MOM 30 ml po Q 6 hrs for constipation.  -Continue Zofran-ODT 4 mg po Q 6 hrs prn for N/V.   Safety and Monitoring: Voluntary admission to inpatient psychiatric unit for safety, stabilization and treatment Daily contact with patient to assess and evaluate symptoms and progress in treatment Patient's case to be discussed in multi-disciplinary team meeting Observation Level : q15 minute checks Vital signs: q12 hours Precautions: Safety   Discharge Planning: Social work and case management to assist with discharge planning and identification of hospital follow-up needs prior to discharge Estimated LOS: 5-7 days Discharge Concerns: Need to establish a safety plan; Medication compliance and effectiveness Discharge Goals: Return home with outpatient referrals for mental health follow-up including medication management/psychotherapy  Armandina Stammer, NP, pmhnp, fnp-bc. 03/11/2023, 4:08 PM

## 2023-03-11 NOTE — Progress Notes (Signed)
Dar Note: Patient observed pacing the hallway talking loud to self.  Pacing the hallway swinging his arms in the air,  shadow boxing and screaming.Threatening to hurt male staff and yelling at male staff. Patient stated, "I'm suppose to be home with my wife and kids but you bitches keeping here."  Patient became more angry and escalated in behavior when redirected by staff.  Agitation protocol given with good effect.

## 2023-03-11 NOTE — Progress Notes (Signed)
Patient is wide awake and has been attempting to walk in the hallway. Patient explained that he has "no air" in his bedroom and that he needed a snack. The air conditioning was turned on and he was provided with a snack. He is refusing to take additional medication at this time.

## 2023-03-12 DIAGNOSIS — F250 Schizoaffective disorder, bipolar type: Secondary | ICD-10-CM | POA: Diagnosis not present

## 2023-03-12 MED ORDER — LORAZEPAM 2 MG/ML IJ SOLN
INTRAMUSCULAR | Status: AC
  Filled 2023-03-12: qty 1

## 2023-03-12 MED ORDER — DIPHENHYDRAMINE HCL 50 MG/ML IJ SOLN
INTRAMUSCULAR | Status: AC
  Filled 2023-03-12: qty 1

## 2023-03-12 MED ORDER — TRAZODONE HCL 100 MG PO TABS
200.0000 mg | ORAL_TABLET | Freq: Every day | ORAL | Status: DC
  Administered 2023-03-12 – 2023-03-13 (×2): 200 mg via ORAL
  Filled 2023-03-12 (×3): qty 2

## 2023-03-12 MED ORDER — LORAZEPAM 2 MG/ML IJ SOLN
2.0000 mg | Freq: Once | INTRAMUSCULAR | Status: AC
  Administered 2023-03-12: 2 mg via INTRAMUSCULAR

## 2023-03-12 MED ORDER — PALIPERIDONE ER 3 MG PO TB24
9.0000 mg | ORAL_TABLET | Freq: Every day | ORAL | Status: DC
  Administered 2023-03-12 – 2023-03-13 (×2): 9 mg via ORAL
  Filled 2023-03-12 (×3): qty 3

## 2023-03-12 MED ORDER — DIPHENHYDRAMINE HCL 50 MG/ML IJ SOLN
50.0000 mg | Freq: Once | INTRAMUSCULAR | Status: AC
  Administered 2023-03-12: 50 mg via INTRAMUSCULAR
  Filled 2023-03-12: qty 1

## 2023-03-12 NOTE — Plan of Care (Signed)

## 2023-03-12 NOTE — Progress Notes (Signed)
Pt observed sleeping in quiet room with door open at this time. Respirations even and unlabored. No distress noted. Will continue to monitor.

## 2023-03-12 NOTE — Progress Notes (Signed)
   03/12/23 0548  15 Minute Checks  Location Dayroom  Visual Appearance Calm  Behavior Composed  Sleep (Behavioral Health Patients Only)  Calculate sleep? (Click Yes once per 24 hr at 0600 safety check) Yes  Documented sleep last 24 hours 1.25

## 2023-03-12 NOTE — Progress Notes (Signed)
Pt constantly coming out his room, pt requesting a cigarette to smoke, pt offered patch, pt wanted a cigarette. Pt acting bizarre, pt stated he needed to get out of here"I'm a pastor , now get out my fucking room" pt offered medication for anxiety, but pt refused , pt informed that he could not be disruptive on the unit or disturb other patients while they are sleeping
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
## 2023-03-12 NOTE — Progress Notes (Signed)
The patient is awake and has attempted to return to his bedroom despite being told not to do so. Patient utilized the restroom in the quiet room and has returned to bed.

## 2023-03-12 NOTE — BHH Counselor (Signed)
Adult Comprehensive Assessment  Patient ID: [name] [name], male   DOB: 1981-05-30, 42 y.o.   MRN: [id number]  Information Source:    Current Stressors:  Patient states their primary concerns and needs for treatment are:: 42 y/o male pt presented under IVC and is acute psychosis with bizarre behavior and hypersexuality. 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 Patient arrived after having received agitation protocol and was drowsy and unsteady. Patient is responding to internal stimuli, singing and rhyming, but calm and cooperative with admission process. Denies SI, HI and AVH Patient states their goals for this hospitilization and ongoing recovery are:: DNA Educational / Learning stressors: DNA Employment / Job issues: DNA Family Relationships: Community education officer / Lack of resources (include bankruptcy): DNA Housing / Lack of housing: DNA Physical health (include injuries & life threatening diseases): DNA Social relationships: DNA Substance abuse: METHAMPHETAMINES Bereavement / Loss: DNA  Living/Environment/Situation:  Living Arrangements: Parent, Other relatives Who else lives in the home?: DNA What is atmosphere in current home: Chaotic, Dangerous  Family History:  Are you sexually active?: No What is your sexual orientation?: Straight Has your sexual activity been affected by drugs, alcohol, medication, or emotional stress?: No Does patient have children?: Yes How many children?: 1 How is patient's relationship with their children?: UTA, due to patient's current mental state  Childhood History:  By whom was/is the patient raised?: Both parents Additional childhood history information: Came to [place] from [place] with both of his parents at age 39yo.  His parents are [place] had to flee their country because his father worked with the [employer]. Description of patient's relationship with caregiver when they were a child: "It was good, nothing was wrong." How were you  disciplined when you got in trouble as a child/adolescent?: Appropriate ways, grounded, told to go to room Does patient have siblings?: Yes Did patient suffer any verbal/emotional/physical/sexual abuse as a child?: No Did patient suffer from severe childhood neglect?: No Has patient ever been sexually abused/assaulted/raped as an adolescent or adult?: No Was the patient ever a victim of a crime or a disaster?: No Has patient been affected by domestic violence as an adult?: No  Education:  Highest grade of school patient has completed: DNA Currently a student?: No Learning disability?: No  Employment/Work Situation:   Employment Situation: On disability Why is Patient on Disability: Mental health How Long has Patient Been on Disability: unknown Patient's Job has Been Impacted by Current Illness: No What is the Longest Time Patient has Held a Job?: been employed since 2012 Has Patient ever Been in the [employer]?: No  Financial Resources:   Surveyor, quantity resources: Insurance claims handler  Alcohol/Substance Abuse:   What has been your use of drugs/alcohol within the last 12 months?: Everyday If yes, describe treatment: DNA  Social Support System:   [employer] Support System: Good Type of faith/religion: DNA How does patient's faith help to cope with current illness?: I smoke  Leisure/Recreation:   Do You Have Hobbies?: Yes Leisure and Hobbies: Swimming, playing basketball, going out to eat per previous assessment  Strengths/Needs:   What is the patient's perception of their strengths?: DNA Patient states they can use these personal strengths during their treatment to contribute to their recovery: DNA Patient states these barriers may affect/interfere with their treatment: DNA Patient states these barriers may affect their return to the community: DNA Other important information patient would like considered in planning for their treatment: DNA  Discharge Plan:   Patient states  concerns and preferences for  aftercare planning are: ACCT Does patient have financial barriers related to discharge medications?: No Will patient be returning to same living situation after discharge?: No  Summary/Recommendations:   Summary and Recommendations (to be completed by the evaluator): 43 y/o m pt acute psychosis with bizarre behavior and hypersexuality. 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 Patient arrived after having received agitation protocol and was drowsy and unsteady. Patient is responding to internal stimuli, singing and rhyming, but calm and cooperative but disorganized during assessment  process. Denies SI, While here, [name] [name] can benefit from crisis stabilization, medication management, therapeutic milieu, and referrals for services.  [name] [name]. 03/12/2023

## 2023-03-12 NOTE — BHH Group Notes (Signed)
Spiritual care group facilitated by Chaplain Katy Claussen, B[facility]  Group focused on topic of strength. Group members reflected on what thoughts and feelings emerge when they hear this topic. They then engaged in facilitated dialog around how strength is present in their lives. This dialog focused on representing what strength had been to them in their lives (images and patterns given) and what they saw as helpful in their life now (what they needed / wanted).  Activity drew on narrative framework.  Patient Progress: Did not attend.  

## 2023-03-12 NOTE — Group Note (Signed)
Date:  03/12/2023 Time:  9:04 PM  Group Topic/Focus:  Wrap-Up Group:   The focus of this group is to help patients review their daily goal of treatment and discuss progress on daily workbooks.    Participation Level:  Did Not Attend   Scot Dock 03/12/2023, 9:04 PM

## 2023-03-12 NOTE — Group Note (Signed)
Recreation Therapy Group Note   Group Topic:Team Building  Group Date: 03/12/2023 Start Time: 1035 End Time: 1105 Facilitators: Nova Evett-McCall, LRT,CTRS Location: [address] Dayroom   Goal Area(s) Addresses:  Patient will effectively work with peer towards shared goal.  Patient will identify skills used to make activity successful.  Patient will identify how skills used during activity can be used to reach post d/c goals.   Intervention: STEM Activity  Group Description:  Straw Bridge. In teams of 3-5, patients were given 15 plastic drinking straws and an equal length of masking tape. Using the materials provided, patients were instructed to build a free standing bridge-like structure to suspend an everyday item (ex: puzzle box) off of the floor or table surface. All materials were required to be used by the team in their design. LRT facilitated post-activity discussion reviewing team process. Patients were encouraged to reflect how the skills used in this activity can be generalized to daily life post discharge.    Affect/Mood: Manic   Participation Level: None   Participation Quality: Maximum Cues   Behavior: Disruptive   Speech/Thought Process: Disorganized   Insight: Lacking   Judgement: Lacking    Modes of Intervention: STEM Activity   Patient Response to Interventions:  Disengaged   Education Outcome:  In group clarification offered    Clinical Observations/Individualized Feedback: Pt was rapping, dancing around and being loud. Pt needed multiple cues throughout group. Pt was unable to be put out of group due to peer being disruptive in hallway. Pt eventually calmed down and less disruptive during group.      Plan: Continue to engage patient in RT group sessions 2-3x/week.   Jarvin Ogren-McCall, LRT,CTRS  03/12/2023 12:38 PM

## 2023-03-13 DIAGNOSIS — F250 Schizoaffective disorder, bipolar type: Secondary | ICD-10-CM | POA: Diagnosis not present

## 2023-03-13 LAB — LITHIUM LEVEL: Lithium Lvl: 0.22 mmol/L — ABNORMAL LOW (ref 0.60–1.20)

## 2023-03-13 MED ORDER — LITHIUM CARBONATE 300 MG PO CAPS
300.0000 mg | ORAL_CAPSULE | Freq: Every day | ORAL | Status: DC
  Administered 2023-03-14 – 2023-03-15 (×2): 300 mg via ORAL
  Filled 2023-03-13 (×4): qty 1

## 2023-03-13 MED ORDER — LITHIUM CARBONATE 300 MG PO CAPS
450.0000 mg | ORAL_CAPSULE | Freq: Every day | ORAL | Status: DC
  Administered 2023-03-13 – 2023-03-14 (×2): 450 mg via ORAL
  Filled 2023-03-13 (×5): qty 1

## 2023-03-13 MED ORDER — DIPHENHYDRAMINE HCL 25 MG PO CAPS
50.0000 mg | ORAL_CAPSULE | Freq: Three times a day (TID) | ORAL | Status: DC | PRN
  Administered 2023-03-17 – 2023-03-26 (×5): 50 mg via ORAL
  Filled 2023-03-13 (×5): qty 2

## 2023-03-13 NOTE — Progress Notes (Signed)
   03/13/23 1300  Psych Admission Type (Psych Patients Only)  Admission Status Involuntary  Psychosocial Assessment  Patient Complaints Restlessness  Eye Contact Fair  Facial Expression Animated;Angry  Affect Preoccupied  Speech Tangential  Interaction Hostile  Motor Activity Pacing;Fidgety  Appearance/Hygiene Disheveled  Behavior Characteristics Anxious;Intrusive;Impulsive  Mood Preoccupied  Thought Process  Coherency Disorganized;Tangential  Content Blaming others  Delusions Paranoid  Perception Hallucinations  Hallucination Auditory  Judgment Impaired  Confusion Mild  Danger to Self  Current suicidal ideation? Denies  Danger to Others  Danger to Others None reported or observed

## 2023-03-13 NOTE — Group Note (Signed)
Date:  03/13/2023 Time:  9:18 PM  Group Topic/Focus:  Wrap-Up Group:   The focus of this group is to help patients review their daily goal of treatment and discuss progress on daily workbooks.    Participation Level:  Active  Participation Quality:  Appropriate  Affect:  Appropriate  Cognitive:  Appropriate  Insight: Appropriate  Engagement in Group:  Engaged  Modes of Intervention:  Education and Exploration  Additional Comments:  Patient attended and participated in group tonight. He reports that today he learn about strenght in group. His strength is having peace.  Lita Mains [facility] 03/13/2023, 9:18 PM

## 2023-03-13 NOTE — Progress Notes (Cosign Needed Addendum)
[facility] MD Progress Note  03/13/2023 5:14 PM [name] [name]  MRN:  [id number]  Reason for admission: Patient placed under IVC by [facility]/[facility]RT Erie Noe, "[name] has been diagnosed with schizophrenia, the respondent has been hallucinating he believes he is God and the devil. [name] is having disorganized and delusional thoughts for example he believes a kitten kissed him while in the back of the patrol car and he believed he was crying tears of blood which was not the case. In addition the respondent was running around the parking lot in a shopping center swinging a sword, stating he would kill them.   Daily notes:  [name] [name] is seen this morning in his room. Chart reviewed. The chart findings discussed with the treatment team. He presents alert, oriented to name & place. He is verbally responsive, making a good eye contact. He is observed walking up & down the unit hall-way. He is approachable, however, remains disorganized, tangential & delusional. He reports, "I got another shot today. I'm ready to go home now. I'm feeling better. Go tell the doctor to come see. If I don't go home today, I will kill myself here. I will shoot myself with a gun. I need to go home, my children are missing me. The medicines are making me drowsy a lot & I blank-out sometimes. I don't like to feel that way". Although presents a bit civil & to a point during this evaluation, patient remains delusional. When asked if he has any gun with him in this hospital. [name] replies, "no, I was just kidding". The staff reports how disruptive patient has been on the unit. Besides his constant pacing along the unit hallway, patient also is observed singing loudly & talking to himself today. Staff reports how verbally aggressive patient can be, calling the male nurses derogatory names. Thaxton can also be sexually inappropriate at times requiring redirections. He continues to require the administration of the agitation protocols due to his  disruptive behaviors/agitation. His Lithium level is 0.22 (low). Will increase his night lithium dose at 450 mg. Other than the complain of drowsiness, patient is currently tolerating his treatment regimen. Will continue as already in progress. His blood pressure is slightly elevated 141/83. Staff will recheck.  Principal Problem: Schizoaffective disorder, bipolar type (HCC)  Diagnosis: Principal Problem:   Schizoaffective disorder, bipolar type (HCC) Active Problems:   Substance-induced psychotic disorder (HCC)  Total Time spent with patient: 45 minutes  Past Psychiatric History: See H&P.  Past Medical History:  Past Medical History:  Diagnosis Date   Anxiety    Depression    Mental disorder    Schizophrenia (HCC)    History reviewed. No pertinent surgical history. Family History:  Family History  Problem Relation Age of Onset   Mental illness Neg Hx    Family Psychiatric  History: See H&P.  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, Pt uses meth    Social History   Socioeconomic History   Marital status: Married    Spouse name: Not on file   Number of children: 1   Years of education: Not on file   Highest education level: Not on file  Occupational History   Occupation: Disabled  Tobacco Use   Smoking status: Every Day  Current packs/day: 1.00    Types: Cigarettes   Smokeless tobacco: Former  Building services engineer status: Unknown  Substance and Sexual Activity   Alcohol use: Not Currently    Comment: haven't drank in months"   Drug use: Yes    Types: Marijuana, Methamphetamines    Comment: 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 Pt lives with mother who IVC'd him.      Pt lives with parents and sister.  He is married, but currently separated.  Pt is on disability.   Social Determinants of Health   Financial Resource Strain: Not on file  Food Insecurity: Patient Unable To Answer (03/09/2023)   Hunger Vital Sign    Worried About Running Out of Food in the Last Year: Patient unable to answer    Ran Out of Food in the Last Year: Patient unable to answer  Transportation Needs: Patient Unable To Answer (03/09/2023)   PRAPARE - Administrator, Civil Service (Medical): Patient unable to answer    Lack of Transportation (Non-Medical): Patient unable to answer  Physical Activity: Not on file  Stress: Not on file  Social Connections: Not on file   Additional Social History:   Sleep: Poor  Appetite:  Good  Current Medications: Current Facility-Administered Medications  Medication Dose Route Frequency Provider Last Rate Last Admin   acetaminophen (TYLENOL) tablet 650 mg  650 mg Oral Q6H PRN Ardis Hughs, NP       alum & mag hydroxide-simeth (MAALOX/MYLANTA) 200-200-20 MG/5ML suspension 30 mL  30 mL Oral Q4H PRN Ardis Hughs, NP       diphenhydrAMINE (BENADRYL) capsule 50 mg  50 mg Oral TID PRN Armandina Stammer I, NP       OLANZapine (ZYPREXA) injection 5 mg  5 mg Intramuscular TID PRN Armandina Stammer I, NP   5 mg at 03/12/23 1143   And   diphenhydrAMINE (BENADRYL) injection 50 mg  50 mg Intravenous TID PRN Armandina Stammer I, NP   50 mg at 03/12/23 1142   hydrOXYzine (ATARAX) tablet 25 mg  25 mg Oral TID PRN Ardis Hughs, NP   25 mg at 03/13/23 9528   lithium carbonate capsule 300 mg  300 mg Oral BID WC Armandina Stammer I, NP   300 mg at 03/13/23 0807   OLANZapine (ZYPREXA) tablet 5 mg  5 mg Oral TID PRN Armandina Stammer I, NP       And   LORazepam (ATIVAN) tablet 1 mg  1 mg Oral TID PRN Armandina Stammer I, NP       magnesium hydroxide (MILK OF MAGNESIA)  suspension 30 mL  30 mL Oral Daily PRN Ardis Hughs, NP       OLANZapine (ZYPREXA) tablet 15 mg  15 mg Oral QHS Willie Loy I, NP   15 mg at 03/12/23 2351   OLANZapine (ZYPREXA) tablet 5 mg  5 mg Oral Daily Nadra Hritz, Nicole Kindred I, NP   5 mg at 03/13/23 0807   ondansetron (ZOFRAN-ODT) disintegrating tablet 4 mg  4 mg Oral Q6H PRN Ardis Hughs, NP       paliperidone (INVEGA) 24 hr tablet 9 mg  9 mg Oral QHS Armandina Stammer I, NP   9 mg at 03/12/23 2353   traZODone (DESYREL) tablet  200 mg  200 mg Oral QHS Arnie Clingenpeel I, NP   200 mg at 03/12/23 2350    Lab Results:  Results for orders placed or performed during the hospital encounter of 03/09/23 (from the past 48 hour(s))  Lithium level     Status: Abnormal   Collection Time: 03/13/23  6:46 AM  Result Value Ref Range   Lithium Lvl 0.22 (L) 0.60 - 1.20 mmol/L    Comment: Performed at [facility], [address] [place], [place] [address]    Blood Alcohol level:  Lab Results  Component Value Date   [facility] <10 03/09/2023   ETH <10 10/05/2022    Metabolic Disorder Labs: Lab Results  Component Value Date   HGBA1C 5.7 (H) 10/07/2022   MPG 117 10/07/2022   MPG 114 10/05/2022   Lab Results  Component Value Date   PROLACTIN 17.4 10/05/2022   PROLACTIN 45.8 (H) 12/19/2017   Lab Results  Component Value Date   CHOL 156 10/05/2022   TRIG 116 10/05/2022   HDL 40 (L) 10/05/2022   CHOLHDL 3.9 10/05/2022   VLDL 23 10/05/2022   LDLCALC 93 10/05/2022   LDLCALC 69 11/21/2020    Physical Findings: AIMS:  , ,  ,  ,    CIWA:    COWS:     Musculoskeletal: Strength & Muscle Tone: within normal limits Gait & Station: normal Patient leans: N/A  Psychiatric Specialty Exam:  Presentation  General Appearance:  Casual  Eye Contact: Fleeting  Speech: Pressured  Speech Volume: Increased  Handedness: Right   Mood and Affect  Mood: Labile (psychotic, responing to internal stimuli.)  Affect: Congruent;  Labile  Thought Process  Thought Processes: Disorganized  Descriptions of Associations:Tangential  Orientation:Partial  Thought Content:Illogical; Tangential; Scattered  History of Schizophrenia/Schizoaffective disorder:Yes  Duration of Psychotic Symptoms:Greater than six months  Hallucinations:No data recorded  Ideas of Reference:Delusions  Suicidal Thoughts:No data recorded  Homicidal Thoughts:No data recorded  Sensorium  Memory: Immediate Fair; Recent Poor; Remote Poor  Judgment: Impaired  Insight: Lacking  Executive Functions  Concentration: Poor  Attention Span: Poor  Recall: Poor  Fund of Knowledge: Poor  Language: Fair  Psychomotor Activity  Psychomotor Activity: No data recorded  Assets  Assets: Social Support; Resilience; Physical Health; Financial Resources/Insurance; Desire for Improvement  Sleep  Sleep: No data recorded  Physical Exam: Physical Exam Vitals and nursing note reviewed.  HENT:     Nose: Nose normal.     Mouth/Throat:     Pharynx: Oropharynx is clear.  Cardiovascular:     Pulses: Normal pulses.     Comments: Blood pressure: elevated 141/94. Pulmonary:     Effort: Pulmonary effort is normal.  Musculoskeletal:        General: Normal range of motion.     Cervical back: Normal range of motion.  Skin:    General: Skin is warm.  Neurological:     General: No focal deficit present.     Mental Status: He is alert and oriented to person, place, and time.    Review of Systems  Constitutional:  Negative for chills, diaphoresis and fever.  HENT:  Negative for congestion and sore throat.   Respiratory:  Negative for cough, shortness of breath and wheezing.   Cardiovascular:  Negative for chest pain and palpitations.  Gastrointestinal:  Negative for abdominal pain, constipation, diarrhea, heartburn, nausea and vomiting.  Musculoskeletal:  Negative for joint pain and myalgias.  Neurological:  Negative for dizziness,  tingling, tremors,  sensory change, speech change, focal weakness, seizures, loss of consciousness, weakness and headaches.  Psychiatric/Behavioral:  Positive for hallucinations and substance abuse. Negative for depression, memory loss and suicidal ideas. The patient is nervous/anxious and has insomnia.    Blood pressure (!) 141/83, pulse 89, temperature 98.7 F (37.1 C), temperature source Oral, resp. rate 18, height 5\' 3"  (1.6 m), weight 70 kg, SpO2 98%. Body mass index is 27.35 kg/m.  Treatment Plan Summary: Daily contact with patient to assess and evaluate symptoms and progress in treatment and Medication management.   Continue inpatient hospitalization.  Will continue today 03/13/2023 plan as below except where it is noted.   Principal/active diagnoses. Schizoaffective disorder, bipolar type.  Substance induced mood disorder.  Stimulant use disorder.  Plan: The risks/benefits/side-effects/alternatives to the medications in use were discussed in detail with the patient and time was given for patient's questions. The patient consents to medication trial.   -Continue Zyprexa 5 mg po q daily for mood instability.  -Continue Lithium carbonate 300 mg po daily for mood stabilization. -Initiated Lithium 450 mg po Q hs (start 03-13-23). -Continue Zyprexa 15 mg po Q hs for psychosis.  -Continue hydroxyzine 25 mg po tid prn for anxiety.  -Continue Trazodone to 200 mg po Q hs for insomnia..  -Continue Invega to 9 mg po Q bedtime for mood control.   Agitation protocols. -Olanzapine 5 mg po tid prn -& Lorazepam 1 mg po tid prn.   OR  -Olanzapine 5 mg IM tid prn  & Benadryl 50 mg IM/PO tid prn.   Other PRNS -Continue Tylenol 650 mg every 6 hours PRN for mild pain -Continue Maalox 30 ml Q 4 hrs PRN for indigestion -Continue MOM 30 ml po Q 6 hrs for constipation.  -Continue Zofran-ODT 4 mg po Q 6 hrs prn for N/V.   Safety and Monitoring: Voluntary admission to inpatient psychiatric  unit for safety, stabilization and treatment Daily contact with patient to assess and evaluate symptoms and progress in treatment Patient's case to be discussed in multi-disciplinary team meeting Observation Level : q15 minute checks Vital signs: q12 hours Precautions: Safety   Discharge Planning: Social work and case management to assist with discharge planning and identification of hospital follow-up needs prior to discharge Estimated LOS: 5-7 days Discharge Concerns: Need to establish a safety plan; Medication compliance and effectiveness Discharge Goals: Return home with outpatient referrals for mental health follow-up including medication management/psychotherapy  Armandina Stammer, NP, pmhnp, fnp-bc. 03/13/2023, 5:14 PM Patient ID: Sevan Finseth, male   DOB: [birth year], 42 y.o.   MRN: [id number] Patient ID: Daronte Thunstrom, male   DOB: [birth year], 42 y.o.   MRN: [id number]

## 2023-03-13 NOTE — Progress Notes (Signed)
Dar Note: Patient presents with irritable affect and mood.  Continues to seek medication when behavior does not meet demand.  Verbally aggressive and abusive towards male staff when requests are not met.  Calling staff "you fucking bitch." Observed the pacing the hallway singing loudly.  Routine safety checks maintained.

## 2023-03-13 NOTE — Plan of Care (Signed)
  Problem: Education: Goal: Knowledge of [facility] Education information/materials will improve 03/13/2023 1903 by Clarene Critchley, RN Outcome: Not Progressing 03/13/2023 1902 by Clarene Critchley, RN Outcome: Not Progressing Goal: Emotional status will improve 03/13/2023 1903 by Clarene Critchley, RN Outcome: Not Progressing 03/13/2023 1902 by Clarene Critchley, RN Outcome: Not Progressing Goal: Mental status will improve 03/13/2023 1903 by Clarene Critchley, RN Outcome: Not Progressing 03/13/2023 1902 by Clarene Critchley, RN Outcome: Not Progressing Goal: Verbalization of understanding the information provided will improve 03/13/2023 1903 by Clarene Critchley, RN Outcome: Not Progressing 03/13/2023 1902 by Clarene Critchley, RN Outcome: Not Progressing   Problem: Activity: Goal: Interest or engagement in activities will improve 03/13/2023 1903 by Clarene Critchley, RN Outcome: Not Progressing 03/13/2023 1902 by Clarene Critchley, RN Outcome: Not Progressing Goal: Sleeping patterns will improve 03/13/2023 1903 by Clarene Critchley, RN Outcome: Not Progressing 03/13/2023 1902 by Clarene Critchley, RN Outcome: Not Progressing   Problem: Coping: Goal: Ability to verbalize frustrations and anger appropriately will improve 03/13/2023 1903 by Clarene Critchley, RN Outcome: Not Progressing 03/13/2023 1902 by Clarene Critchley, RN Outcome: Not Progressing Goal: Ability to demonstrate self-control will improve 03/13/2023 1903 by Clarene Critchley, RN Outcome: Not Progressing 03/13/2023 1902 by Clarene Critchley, RN Outcome: Not Progressing   Problem: Health Behavior/Discharge Planning: Goal: Identification of resources available to assist in meeting health care needs will improve 03/13/2023 1903 by Clarene Critchley, RN Outcome: Not Progressing 03/13/2023 1902 by Clarene Critchley, RN Outcome: Not  Progressing Goal: Compliance with treatment plan for underlying cause of condition will improve 03/13/2023 1903 by Clarene Critchley, RN Outcome: Not Progressing 03/13/2023 1902 by Clarene Critchley, RN Outcome: Not Progressing   Problem: Physical Regulation: Goal: Ability to maintain clinical measurements within normal limits will improve 03/13/2023 1903 by Clarene Critchley, RN Outcome: Not Progressing 03/13/2023 1902 by Clarene Critchley, RN Outcome: Not Progressing   Problem: Safety: Goal: Periods of time without injury will increase 03/13/2023 1903 by Clarene Critchley, RN Outcome: Not Progressing 03/13/2023 1902 by Clarene Critchley, RN Outcome: Not Progressing

## 2023-03-13 NOTE — Progress Notes (Signed)
Patient in hallway rapping, singing and talking to himself,  Patient given water.  PRN medication was offered, patient refused.

## 2023-03-13 NOTE — Plan of Care (Signed)

## 2023-03-14 DIAGNOSIS — F250 Schizoaffective disorder, bipolar type: Secondary | ICD-10-CM | POA: Diagnosis not present

## 2023-03-14 MED ORDER — HYDROXYZINE HCL 50 MG PO TABS
50.0000 mg | ORAL_TABLET | ORAL | Status: DC | PRN
  Administered 2023-03-15 – 2023-04-08 (×24): 50 mg via ORAL
  Filled 2023-03-14 (×26): qty 1

## 2023-03-14 MED ORDER — PALIPERIDONE ER 6 MG PO TB24
12.0000 mg | ORAL_TABLET | Freq: Every day | ORAL | Status: DC
  Administered 2023-03-14 – 2023-03-20 (×7): 12 mg via ORAL
  Filled 2023-03-14 (×8): qty 2

## 2023-03-14 MED ORDER — TRAZODONE HCL 150 MG PO TABS
300.0000 mg | ORAL_TABLET | Freq: Every day | ORAL | Status: DC
  Administered 2023-03-14 – 2023-04-08 (×26): 300 mg via ORAL
  Filled 2023-03-14 (×31): qty 2

## 2023-03-14 MED ORDER — DIPHENHYDRAMINE HCL 50 MG/ML IJ SOLN
50.0000 mg | Freq: Three times a day (TID) | INTRAMUSCULAR | Status: DC | PRN
  Administered 2023-03-16 – 2023-03-25 (×3): 50 mg via INTRAMUSCULAR
  Filled 2023-03-14 (×4): qty 1

## 2023-03-14 MED ORDER — GABAPENTIN 300 MG PO CAPS
300.0000 mg | ORAL_CAPSULE | Freq: Three times a day (TID) | ORAL | Status: DC
  Administered 2023-03-14 – 2023-04-09 (×80): 300 mg via ORAL
  Filled 2023-03-14 (×86): qty 1

## 2023-03-14 MED ORDER — OLANZAPINE 10 MG IM SOLR
5.0000 mg | Freq: Three times a day (TID) | INTRAMUSCULAR | Status: DC | PRN
  Administered 2023-03-16 – 2023-03-25 (×3): 5 mg via INTRAMUSCULAR
  Filled 2023-03-14 (×4): qty 10

## 2023-03-14 NOTE — Progress Notes (Signed)
[facility] MD Progress Note  03/14/2023 9:02 AM [name] [name]  MRN:  [id number]  Reason for admission: Patient placed under IVC by GPD/BHRT Erie Noe, "[name] has been diagnosed with schizophrenia, the respondent has been hallucinating he believes he is God and the devil. [name] is having disorganized and delusional thoughts for example he believes a kitten kissed him while in the back of the patrol car and he believed he was crying tears of blood which was not the case. In addition the respondent was running around the parking lot in a shopping center swinging a sword, stating he would kill them.   Daily notes: [name] [name] is seen this morning in his room. Chart reviewed. The chart findings discussed with the treatment team. He presents alert, oriented to name & place. He is verbally responsive, making a good eye contact. He is observed sitting down on his mattress he placed on the floor. He says he likes it that way. He is approachable, a bit clearer, however, remains disorganized, tangential, grandiose & delusional. He presents a bit drowsy today. He reports, "I'm doing good but tired. I feel dizzy. The medicines are making me dizzy. They are slowing me down. I don't like to feel this way. You guys told me that when ever I get my shot that I will be discharged. I'm still here why? I have received 3-4 shots already. The medicines are making me too tired. I don't want to die. I have my wife & two children out there. They need me. Have you seen how this people are holding me to give me shots like I'm a monkey. Tell the doctor to come tell me when I will be discharged because I'm ready". [name] is reminded that the shots he received were when he was acting irrational, agitated, disruptive & verbally aggressive towards the staff or other patients. He replied, "I sing, I rap. I like to have fun. I don't bother no body. Can you pray with me. When I get out of here, I'm gonna open a church for [employer],  catholic, [place], baptist, [employer] & the devil worshippers. You can visit. I will be the pastor. It will be preaching for every body". Patient currently denies any SIHI, AVH. However, staff reports that he is responding to internal stimuli. Patient's night time lithium dose was increased to 450 mg yesterday. His paliperidone increased from 9 mg to 12 mg starting tonight. He remains on the other medications as already in progress. Vital signs remain stable. Discussed this acse with the attending psychiatrist. Continue as already in progress.   Principal Problem: Schizoaffective disorder, bipolar type (HCC)  Diagnosis: Principal Problem:   Schizoaffective disorder, bipolar type (HCC) Active Problems:   Substance-induced psychotic disorder (HCC)  Total Time spent with patient: 45 minutes  Past Psychiatric History: See H&P.  Past Medical History:  Past Medical History:  Diagnosis Date   Anxiety    Depression    Mental disorder    Schizophrenia (HCC)    History reviewed. No pertinent surgical history. Family History:  Family History  Problem Relation Age of Onset   Mental illness Neg Hx    Family Psychiatric  History: See H&P.  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, Pt uses meth  Social History   Socioeconomic History   Marital status: Married    Spouse name: Not on file   Number of children: 1   Years of education: Not on file   Highest education level: Not on file  Occupational History   Occupation: Disabled  Tobacco Use   Smoking status: Every Day    Current packs/day: 1.00    Types: Cigarettes   Smokeless tobacco: Former  Building services engineer status: Unknown  Substance and Sexual Activity   Alcohol use: Not Currently    Comment: haven't drank in  months"   Drug use: Yes    Types: Marijuana, Methamphetamines    Comment: 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 Pt lives with mother who IVC'd him.      Pt lives with parents and sister.  He is married, but currently separated.  Pt is on disability.   Social Determinants of Health   Financial Resource Strain: Not on file  Food Insecurity: Patient Unable To Answer (03/09/2023)   Hunger Vital Sign    Worried About Running Out of Food in the Last Year: Patient unable to answer    Ran Out of Food in the Last Year: Patient unable to answer  Transportation Needs: Patient Unable To Answer (03/09/2023)   PRAPARE - Administrator, Civil Service (Medical): Patient unable to answer    Lack of Transportation (Non-Medical): Patient unable to answer  Physical Activity: Not on file  Stress: Not on file  Social Connections: Not on file   Additional Social History:   Sleep: Poor  Appetite:  Good  Current Medications: Current Facility-Administered Medications  Medication Dose Route Frequency Provider Last Rate Last Admin   acetaminophen (TYLENOL) tablet 650 mg  650 mg Oral Q6H PRN Ardis Hughs, NP       alum & mag hydroxide-simeth (MAALOX/MYLANTA) 200-200-20 MG/5ML suspension 30 mL  30 mL Oral Q4H PRN Ardis Hughs, NP       diphenhydrAMINE (BENADRYL) capsule 50 mg  50 mg Oral TID PRN Armandina Stammer I, NP       OLANZapine (ZYPREXA) injection 5 mg  5 mg Intramuscular TID PRN Bobbitt, Shalon E, NP       And   diphenhydrAMINE (BENADRYL) injection 50 mg  50 mg Intramuscular TID PRN Bobbitt, Shalon E, NP       gabapentin (NEURONTIN) capsule 300 mg  300 mg Oral TID Armandina Stammer I, NP       hydrOXYzine (ATARAX) tablet 50 mg  50 mg Oral Q4H PRN Jerett Odonohue I, NP       lithium carbonate capsule 300 mg  300 mg Oral Daily  Zahriah Roes I, NP   300 mg at 03/14/23 0753   lithium carbonate capsule 450 mg  450 mg Oral QHS Armandina Stammer I, NP   450 mg at 03/13/23 2112   OLANZapine (ZYPREXA) tablet 5 mg  5 mg Oral TID PRN Armandina Stammer I, NP       And   LORazepam (ATIVAN) tablet 1 mg  1 mg Oral TID PRN Armandina Stammer I, NP       magnesium hydroxide (MILK OF MAGNESIA) suspension 30 mL  30 mL Oral Daily PRN Ardis Hughs, NP       OLANZapine (ZYPREXA) tablet  15 mg  15 mg Oral QHS Armandina Stammer I, NP   15 mg at 03/13/23 2111   OLANZapine (ZYPREXA) tablet 5 mg  5 mg Oral Daily Armandina Stammer I, NP   5 mg at 03/14/23 0753   ondansetron (ZOFRAN-ODT) disintegrating tablet 4 mg  4 mg Oral Q6H PRN Ardis Hughs, NP       paliperidone (INVEGA) 24 hr tablet 12 mg  12 mg Oral QHS Haelyn Forgey I, NP       traZODone (DESYREL) tablet 300 mg  300 mg Oral QHS Armandina Stammer I, NP        Lab Results:  Results for orders placed or performed during the hospital encounter of 03/09/23 (from the past 48 hour(s))  Lithium level     Status: Abnormal   Collection Time: 03/13/23  6:46 AM  Result Value Ref Range   Lithium Lvl 0.22 (L) 0.60 - 1.20 mmol/L    Comment: Performed at [facility], [address], [place], [place] [address]    Blood Alcohol level:  Lab Results  Component Value Date   [facility] <10 03/09/2023   ETH <10 10/05/2022    Metabolic Disorder Labs: Lab Results  Component Value Date   HGBA1C 5.7 (H) 10/07/2022   MPG 117 10/07/2022   MPG 114 10/05/2022   Lab Results  Component Value Date   PROLACTIN 17.4 10/05/2022   PROLACTIN 45.8 (H) 12/19/2017   Lab Results  Component Value Date   CHOL 156 10/05/2022   TRIG 116 10/05/2022   HDL 40 (L) 10/05/2022   CHOLHDL 3.9 10/05/2022   VLDL 23 10/05/2022   LDLCALC 93 10/05/2022   LDLCALC 69 11/21/2020    Physical Findings: AIMS:  , ,  ,  ,    CIWA:    COWS:     Musculoskeletal: Strength & Muscle Tone: within normal limits Gait & Station:  normal Patient leans: N/A  Psychiatric Specialty Exam:  Presentation  General Appearance:  Casual  Eye Contact: Fleeting  Speech: Pressured  Speech Volume: Increased  Handedness: Right   Mood and Affect  Mood: Labile (psychotic, responing to internal stimuli.)  Affect: Congruent; Labile  Thought Process  Thought Processes: Disorganized  Descriptions of Associations:Tangential  Orientation:Partial  Thought Content:Illogical; Tangential; Scattered  History of Schizophrenia/Schizoaffective disorder:Yes  Duration of Psychotic Symptoms:Greater than six months  Hallucinations:No data recorded  Ideas of Reference:Delusions  Suicidal Thoughts:No data recorded  Homicidal Thoughts:No data recorded  Sensorium  Memory: Immediate Fair; Recent Poor; Remote Poor  Judgment: Impaired  Insight: Lacking  Executive Functions  Concentration: Poor  Attention Span: Poor  Recall: Poor  Fund of Knowledge: Poor  Language: Fair  Psychomotor Activity  Psychomotor Activity: No data recorded  Assets  Assets: Social Support; Resilience; Physical Health; Financial Resources/Insurance; Desire for Improvement  Sleep  Sleep: No data recorded  Physical Exam: Physical Exam Vitals and nursing note reviewed.  HENT:     Nose: Nose normal.     Mouth/Throat:     Pharynx: Oropharynx is clear.  Cardiovascular:     Pulses: Normal pulses.     Comments: Blood pressure: elevated 141/94. Pulmonary:     Effort: Pulmonary effort is normal.  Musculoskeletal:        General: Normal range of motion.     Cervical back: Normal range of motion.  Skin:    General: Skin is warm.  Neurological:     General: No focal deficit present.     Mental Status:  He is alert and oriented to person, place, and time.    Review of Systems  Constitutional:  Negative for chills, diaphoresis and fever.  HENT:  Negative for congestion and sore throat.   Respiratory:  Negative for  cough, shortness of breath and wheezing.   Cardiovascular:  Negative for chest pain and palpitations.  Gastrointestinal:  Negative for abdominal pain, constipation, diarrhea, heartburn, nausea and vomiting.  Musculoskeletal:  Negative for joint pain and myalgias.  Neurological:  Negative for dizziness, tingling, tremors, sensory change, speech change, focal weakness, seizures, loss of consciousness, weakness and headaches.  Psychiatric/Behavioral:  Positive for hallucinations and substance abuse. Negative for depression, memory loss and suicidal ideas. The patient is nervous/anxious and has insomnia.    Blood pressure 119/79, pulse 80, temperature 98.3 F (36.8 C), temperature source Oral, resp. rate 18, height 5\' 3"  (1.6 m), weight 70 kg, SpO2 98%. Body mass index is 27.35 kg/m.  Treatment Plan Summary: Daily contact with patient to assess and evaluate symptoms and progress in treatment and Medication management.   Continue inpatient hospitalization.  Will continue today 03/14/2023 plan as below except where it is noted.   Principal/active diagnoses. Schizoaffective disorder, bipolar type.  Substance induced mood disorder.  Stimulant use disorder.  Plan: The risks/benefits/side-effects/alternatives to the medications in use were discussed in detail with the patient and time was given for patient's questions. The patient consents to medication trial.   -Continue Zyprexa 5 mg po q daily for mood instability.  -Continue Lithium carbonate 300 mg po daily for mood stabilization. -Initiated Lithium 450 mg po Q hs (start 03-13-23). -Continue Zyprexa 15 mg po Q hs for psychosis.  -Continue hydroxyzine 25 mg po tid prn for anxiety.  -Continue Trazodone to 200 mg po Q hs for insomnia..  -Continue Invega to 9 mg po Q bedtime for mood control.   Agitation protocols. -Olanzapine 5 mg po tid prn -& Lorazepam 1 mg po tid prn.   OR  -Olanzapine 5 mg IM tid prn  & Benadryl 50 mg IM/PO tid  prn.   Other PRNS -Continue Tylenol 650 mg every 6 hours PRN for mild pain -Continue Maalox 30 ml Q 4 hrs PRN for indigestion -Continue MOM 30 ml po Q 6 hrs for constipation.  -Continue Zofran-ODT 4 mg po Q 6 hrs prn for N/V.   Safety and Monitoring: Voluntary admission to inpatient psychiatric unit for safety, stabilization and treatment Daily contact with patient to assess and evaluate symptoms and progress in treatment Patient's case to be discussed in multi-disciplinary team meeting Observation Level : q15 minute checks Vital signs: q12 hours Precautions: Safety   Discharge Planning: Social work and case management to assist with discharge planning and identification of hospital follow-up needs prior to discharge Estimated LOS: 5-7 days Discharge Concerns: Need to establish a safety plan; Medication compliance and effectiveness Discharge Goals: Return home with outpatient referrals for mental health follow-up including medication management/psychotherapy  Armandina Stammer, NP, pmhnp, fnp-bc. 03/14/2023, 9:02 AM Patient ID: [name] [name], male   DOB: 07-17-1980, 42 y.o.   MRN: [id number] Patient ID: [name] [name], male   DOB: [birth year], 42 y.o.   MRN: [id number] Patient ID: [name] [name], male   DOB: 1981-06-07, 42 y.o.   MRN: [id number]

## 2023-03-14 NOTE — Progress Notes (Signed)
Patient is wide awake at this time and has not made any attempt to go to sleep. He has been observed talking and rapping to himself in his bedroom. In addition, the staff has redirected him several times to remain in his bedroom.

## 2023-03-14 NOTE — Progress Notes (Signed)
The patient was redirected to return to his bedroom since he was pacing in the hallway. He refused to comply. The patient began to pace at a higher rate and proceeded to sing loudly in the hallway. After being redirected once again, the patient turned to this author and yelled.

## 2023-03-14 NOTE — Plan of Care (Signed)
  Problem: Education: Goal: Emotional status will improve Outcome: Progressing Goal: Mental status will improve Outcome: Progressing   

## 2023-03-14 NOTE — Progress Notes (Signed)
Patient was noted to be irritable when he he was given medications this morning. Patient denies SI, HI, and AVH. Patient is being monitored for aggression due to threatening to hit the nurse practitioner during assessment. Patient was able to be redirected and able to calm.   Assess patient for safety, offer medications as prescribed, engage patient in 1:1 staff talk.   Patient able to contract for safety. Continue to monitor as planned.

## 2023-03-14 NOTE — B[facility] Group Notes (Signed)
Pt was asleep during wrap up group.

## 2023-03-15 ENCOUNTER — Encounter (HOSPITAL_COMMUNITY): Payer: Self-pay

## 2023-03-15 DIAGNOSIS — F250 Schizoaffective disorder, bipolar type: Secondary | ICD-10-CM | POA: Diagnosis not present

## 2023-03-15 MED ORDER — DIVALPROEX SODIUM 250 MG PO DR TAB
750.0000 mg | DELAYED_RELEASE_TABLET | Freq: Two times a day (BID) | ORAL | Status: DC
  Administered 2023-03-15 – 2023-03-26 (×22): 750 mg via ORAL
  Filled 2023-03-15 (×27): qty 3

## 2023-03-15 NOTE — BH IP Treatment Plan (Signed)
Interdisciplinary Treatment and Diagnostic Plan Update  03/15/2023 Time of Session: 9:45 AM ( UPDATE)  [name] [name] MRN: [id number]  Principal Diagnosis: Schizoaffective disorder, bipolar type (HCC)  Secondary Diagnoses: Principal Problem:   Schizoaffective disorder, bipolar type (HCC) Active Problems:   Substance-induced psychotic disorder (HCC)   Current Medications:  Current Facility-Administered Medications  Medication Dose Route Frequency Provider Last Rate Last Admin   acetaminophen (TYLENOL) tablet 650 mg  650 mg Oral Q6H PRN Ardis Hughs, NP       alum & mag hydroxide-simeth (MAALOX/MYLANTA) 200-200-20 MG/5ML suspension 30 mL  30 mL Oral Q4H PRN Ardis Hughs, NP       diphenhydrAMINE (BENADRYL) capsule 50 mg  50 mg Oral TID PRN Armandina Stammer I, NP       OLANZapine (ZYPREXA) injection 5 mg  5 mg Intramuscular TID PRN Bobbitt, Shalon E, NP       And   diphenhydrAMINE (BENADRYL) injection 50 mg  50 mg Intramuscular TID PRN Bobbitt, Shalon E, NP       divalproex (DEPAKOTE) DR tablet 750 mg  750 mg Oral BID Lauro Franklin, MD       gabapentin (NEURONTIN) capsule 300 mg  300 mg Oral TID Armandina Stammer I, NP   300 mg at 03/15/23 1302   hydrOXYzine (ATARAX) tablet 50 mg  50 mg Oral Q4H PRN Armandina Stammer I, NP   50 mg at 03/15/23 0801   OLANZapine (ZYPREXA) tablet 5 mg  5 mg Oral TID PRN Armandina Stammer I, NP       And   LORazepam (ATIVAN) tablet 1 mg  1 mg Oral TID PRN Armandina Stammer I, NP       magnesium hydroxide (MILK OF MAGNESIA) suspension 30 mL  30 mL Oral Daily PRN Ardis Hughs, NP       OLANZapine (ZYPREXA) tablet 15 mg  15 mg Oral QHS Nwoko, Nicole Kindred I, NP   15 mg at 03/14/23 2108   OLANZapine (ZYPREXA) tablet 5 mg  5 mg Oral Daily Armandina Stammer I, NP   5 mg at 03/15/23 0801   paliperidone (INVEGA) 24 hr tablet 12 mg  12 mg Oral QHS Nwoko, Nicole Kindred I, NP   12 mg at 03/14/23 2107   traZODone (DESYREL) tablet 300 mg  300 mg Oral QHS Armandina Stammer I, NP   300 mg at 03/14/23  2108   PTA Medications: Medications Prior to Admission  Medication Sig Dispense Refill Last Dose   INVEGA TRINZA 546 MG/1.75ML injection Inject 546 mg into the muscle every 3 (three) months. (Patient not taking: Reported on 03/09/2023)      lithium 300 MG tablet Take 300 mg by mouth daily. (Patient not taking: Reported on 03/09/2023)      OLANZapine (ZYPREXA) 20 MG tablet Take 1 tablet (20 mg total) by mouth at bedtime. For mood control (Patient not taking: Reported on 03/09/2023) 30 tablet 0    OLANZapine (ZYPREXA) 5 MG tablet Take 1 tablet (5 mg total) by mouth 2 (two) times daily.       Patient Stressors: Other: don't want to be here.    Patient Strengths: Supportive family/friends   Treatment Modalities: Medication Management, Group therapy, Case management,  1 to 1 session with clinician, Psychoeducation, Recreational therapy.   Physician Treatment Plan for Primary Diagnosis: Schizoaffective disorder, bipolar type (HCC) Long Term Goal(s): Improvement in symptoms so as ready for discharge   Short Term Goals: Ability to identify and develop effective coping behaviors  will improve Ability to maintain clinical measurements within normal limits will improve Compliance with prescribed medications will improve Ability to identify triggers associated with substance abuse/mental health issues will improve Ability to identify changes in lifestyle to reduce recurrence of condition will improve Ability to verbalize feelings will improve Ability to disclose and discuss suicidal ideas Ability to demonstrate self-control will improve  Medication Management: Evaluate patient's response, side effects, and tolerance of medication regimen.  Therapeutic Interventions: 1 to 1 sessions, Unit Group sessions and Medication administration.  Evaluation of Outcomes: Not Progressing  Physician Treatment Plan for Secondary Diagnosis: Principal Problem:   Schizoaffective disorder, bipolar type (HCC) Active  Problems:   Substance-induced psychotic disorder (HCC)  Long Term Goal(s): Improvement in symptoms so as ready for discharge   Short Term Goals: Ability to identify and develop effective coping behaviors will improve Ability to maintain clinical measurements within normal limits will improve Compliance with prescribed medications will improve Ability to identify triggers associated with substance abuse/mental health issues will improve Ability to identify changes in lifestyle to reduce recurrence of condition will improve Ability to verbalize feelings will improve Ability to disclose and discuss suicidal ideas Ability to demonstrate self-control will improve     Medication Management: Evaluate patient's response, side effects, and tolerance of medication regimen.  Therapeutic Interventions: 1 to 1 sessions, Unit Group sessions and Medication administration.  Evaluation of Outcomes: Not Progressing   RN Treatment Plan for Primary Diagnosis: Schizoaffective disorder, bipolar type (HCC) Long Term Goal(s): Knowledge of disease and therapeutic regimen to maintain health will improve  Short Term Goals: Ability to remain free from injury will improve, Ability to verbalize frustration and anger appropriately will improve, Ability to participate in decision making will improve, Ability to verbalize feelings will improve, Ability to identify and develop effective coping behaviors will improve, and Compliance with prescribed medications will improve  Medication Management: RN will administer medications as ordered by provider, will assess and evaluate patient's response and provide education to patient for prescribed medication. RN will report any adverse and/or side effects to prescribing provider.  Therapeutic Interventions: 1 on 1 counseling sessions, Psychoeducation, Medication administration, Evaluate responses to treatment, Monitor vital signs and CBGs as ordered, Perform/monitor CIWA, COWS,  AIMS and Fall Risk screenings as ordered, Perform wound care treatments as ordered.  Evaluation of Outcomes: Not Progressing   LCSW Treatment Plan for Primary Diagnosis: Schizoaffective disorder, bipolar type (HCC) Long Term Goal(s): Safe transition to appropriate next level of care at discharge, Engage patient in therapeutic group addressing interpersonal concerns.  Short Term Goals: Engage patient in aftercare planning with referrals and resources, Increase social support, Increase emotional regulation, Facilitate acceptance of mental health diagnosis and concerns, Identify triggers associated with mental health/substance abuse issues, and Increase skills for wellness and recovery  Therapeutic Interventions: Assess for all discharge needs, 1 to 1 time with Social worker, Explore available resources and support systems, Assess for adequacy in community support network, Educate family and significant other(s) on suicide prevention, Complete Psychosocial Assessment, Interpersonal group therapy.  Evaluation of Outcomes: Not Progressing   Progress in Treatment: Attending groups: Yes. Participating in groups: Yes. Taking medication as prescribed: Yes. Toleration medication: Yes. Family/Significant other contact made: No, will contact:  Pt declined Consent Patient understands diagnosis: No. Discussing patient identified problems/goals with staff: Yes. Medical problems stabilized or resolved: Yes. Denies suicidal/homicidal ideation: No. Issues/concerns per patient self-inventory: No.    New problem(s) identified: No, Describe:  None Reported   New Short Term/Long Term Goal(s):  medication stabilization, elimination of SI thoughts, development of comprehensive mental wellness plan.      Patient Goals:  "I need to get out of this place"   Discharge Plan or Barriers: :  Patient recently admitted. CSW will continue to follow and assess for appropriate referrals and possible discharge  planning.    Reason for Continuation of Hospitalization: Aggression Delusions  Hallucinations Homicidal ideation Medication stabilization Withdrawal symptoms   Estimated Length of Stay: 5-7 Days   Last 3 [place] Suicide Severity Risk Score: Flowsheet Row [facility] (Current[facility] Most recent reading at 03/09/2023  2:00 PM ED from 03/09/2023 in [facility] Most recent reading at 03/09/2023  9:44 AM ED from 01/13/2023 in [facility] Most recent reading at 01/13/2023  2:16 AM  C-SSRS RISK CATEGORY No Risk No Risk No Risk       Last PHQ 2/9 Scores:     No data to display          Scribe for Treatment Team: [name] 03/15/2023 4:12 PM

## 2023-03-15 NOTE — Progress Notes (Signed)
D- Patient alert and oriented. Denies SI, HI, AVH, and pain. Patient observed pacing the hallway with intrusive and hypersexual behaviors such as blowing kisses and stating, "The scissors are going to fall in the pussy hole."  A- Scheduled medications administered to patient, per MAR. Support and encouragement provided.  Routine safety checks conducted every 15 minutes.  Patient informed to notify staff with problems or concerns.  R- No adverse drug reactions noted. Patient contracts for safety at this time. Patient compliant with medications and treatment plan.  Patient interacts well with others on the unit.  Patient remains safe at this time.

## 2023-03-15 NOTE — Progress Notes (Signed)
   03/15/23 0529  15 Minute Checks  Location Bedroom  Visual Appearance Calm  Behavior Composed  Sleep (Behavioral Health Patients Only)  Calculate sleep? (Click Yes once per 24 hr at 0600 safety check) Yes  Documented sleep last 24 hours 6.75

## 2023-03-15 NOTE — Progress Notes (Signed)
   03/14/23 2108  Psych Admission Type (Psych Patients Only)  Admission Status Involuntary  Psychosocial Assessment  Patient Complaints Restlessness  Eye Contact Fair  Facial Expression Animated;Angry  Affect Preoccupied  Speech Tangential  Interaction Intrusive  Motor Activity Fidgety;Pacing  Appearance/Hygiene Disheveled  Behavior Characteristics Cooperative  Mood Preoccupied  Thought Process  Coherency Disorganized  Content Ambivalence  Delusions Paranoid  Perception WDL  Hallucination None reported or observed  Judgment Impaired  Confusion Mild  Danger to Self  Current suicidal ideation? Denies  Danger to Others  Danger to Others None reported or observed

## 2023-03-15 NOTE — Plan of Care (Signed)
  Problem: Activity: Goal: Interest or engagement in activities will improve Outcome: Progressing   Problem: Coping: Goal: Ability to demonstrate self-control will improve Outcome: Progressing   

## 2023-03-15 NOTE — Progress Notes (Signed)
[facility] MD Progress Note  03/15/2023 12:52 PM Asiel Kampfer  MRN:  [id number] Subjective:   Maicol Bobick is a 42 yr old male who presented on 9/3 to [facility] with delusions and paranoia, he was admitted to [facility] on 9/4.  PPHx is significant for Schizoaffective Disorder, Bipolar Type, Anxiety, Depression, and Polysubstance Abuse (Cocaine, Meth, EtOH, and an extensive history of aggression, and 6 Prior Psychiatric Hospitalizations (last [facility]- 10/2022), and no history of Suicide Attempts or Self Injurious Behavior. He does have an ACT Team- [facility].   Case was discussed in the multidisciplinary team. MAR was reviewed and patient was compliant with medications.  He received PRN Hydroxyzine this morning.    Psychiatric Team made the following recommendations yesterday: -Increase Invega to 12 mg QHS for psychosis and mood stability -Continue Zyprexa 5 mg AM and 15 mg QHS for psychosis and mood stability -Continue Lithium 300 mg AM and 450 mg QHS for mood stability. -Increase Trazodone to 300 mg QHS for sleep     On interview today patient reports he slept good last night.  He reports his appetite is doing good.  He reports no SI, HI, or AVH.  He reports no issues with his medications.  He reports that he is fasted with his wife and with his family to be better.  He reports that this is his last stop in his journey.  He then reports that he is the father of the provider.  He reports "I am your father, I had sex with them (pointing towards the floor where the basement is) to have you."  He reports that everyone is in the [facility].  He reports no other concerns are present.  Principal Problem: Schizoaffective disorder, bipolar type (HCC) Diagnosis: Principal Problem:   Schizoaffective disorder, bipolar type (HCC) Active Problems:   Substance-induced psychotic disorder (HCC)  Total Time spent with patient:  I personally spent 35 minutes on the unit in direct patient care. The direct patient care time included  face-to-face time with the patient, reviewing the patient's chart, communicating with other professionals, and coordinating care. Greater than 50% of this time was spent in counseling or coordinating care with the patient regarding goals of hospitalization, psycho-education, and discharge planning needs.   Past Psychiatric History: Schizoaffective Disorder, Bipolar Type, Anxiety, Depression, and Polysubstance Abuse (Cocaine, Meth, EtOH, and an extensive history of aggression, and 6 Prior Psychiatric Hospitalizations (last [facility]- 10/2022), and no history of Suicide Attempts or Self Injurious Behavior.  He does have an ACT Team- [facility].  Past Medical History:  Past Medical History:  Diagnosis Date   Anxiety    Depression    Mental disorder    Schizophrenia (HCC)    History reviewed. No pertinent surgical history. Family History:  Family History  Problem Relation Age of Onset   Mental illness Neg Hx    Family Psychiatric  History: Reports None 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, Pt uses meth    Social History   Socioeconomic History   Marital status: Married    Spouse name: Not on file   Number of children: 1   Years of education: Not on file   Highest education level: Not on file  Occupational History   Occupation:  Disabled  Tobacco Use   Smoking status: Every Day    Current packs/day: 1.00    Types: Cigarettes   Smokeless tobacco: Former  Building services engineer status: Unknown  Substance and Sexual Activity   Alcohol use: Not Currently    Comment: haven't drank in months"   Drug use: Yes    Types: Marijuana, Methamphetamines    Comment: Per sister, Pt uses meth   Sexual activity: Yes    Birth control/protection: None  Other Topics Concern    Not on file  Social History Narrative   Pt refusing to answer any questions during assessment. Pt lives with mother who IVC'd him.      Pt lives with parents and sister.  He is married, but currently separated.  Pt is on disability.   Social Determinants of Health   Financial Resource Strain: Not on file  Food Insecurity: Patient Unable To Answer (03/09/2023)   Hunger Vital Sign    Worried About Running Out of Food in the Last Year: Patient unable to answer    Ran Out of Food in the Last Year: Patient unable to answer  Transportation Needs: Patient Unable To Answer (03/09/2023)   PRAPARE - Administrator, Civil Service (Medical): Patient unable to answer    Lack of Transportation (Non-Medical): Patient unable to answer  Physical Activity: Not on file  Stress: Not on file  Social Connections: Not on file   Additional Social History:                         Sleep: Good  Appetite:  Good  Current Medications: Current Facility-Administered Medications  Medication Dose Route Frequency Provider Last Rate Last Admin   acetaminophen (TYLENOL) tablet 650 mg  650 mg Oral Q6H PRN Ardis Hughs, NP       alum & mag hydroxide-simeth (MAALOX/MYLANTA) 200-200-20 MG/5ML suspension 30 mL  30 mL Oral Q4H PRN Ardis Hughs, NP       diphenhydrAMINE (BENADRYL) capsule 50 mg  50 mg Oral TID PRN Armandina Stammer I, NP       OLANZapine (ZYPREXA) injection 5 mg  5 mg Intramuscular TID PRN Bobbitt, Shalon E, NP       And   diphenhydrAMINE (BENADRYL) injection 50 mg  50 mg Intramuscular TID PRN Bobbitt, Shalon E, NP       gabapentin (NEURONTIN) capsule 300 mg  300 mg Oral TID Armandina Stammer I, NP   300 mg at 03/15/23 0802   hydrOXYzine (ATARAX) tablet 50 mg  50 mg Oral Q4H PRN Armandina Stammer I, NP   50 mg at 03/15/23 0801   lithium carbonate capsule 300 mg  300 mg Oral Daily Armandina Stammer I, NP   300 mg at 03/15/23 9562   lithium carbonate capsule 450 mg  450 mg Oral QHS Armandina Stammer  I, NP   450 mg at 03/14/23 2107   OLANZapine (ZYPREXA) tablet 5 mg  5 mg Oral TID PRN Armandina Stammer I, NP       And   LORazepam (ATIVAN) tablet 1 mg  1 mg Oral TID PRN Armandina Stammer I, NP       magnesium hydroxide (MILK OF MAGNESIA) suspension 30 mL  30 mL Oral Daily PRN Ardis Hughs, NP       OLANZapine (ZYPREXA) tablet 15 mg  15 mg Oral QHS Armandina Stammer I, NP   15 mg at 03/14/23 2108  OLANZapine (ZYPREXA) tablet 5 mg  5 mg Oral Daily Nwoko, Agnes I, NP   5 mg at 03/15/23 0801   paliperidone (INVEGA) 24 hr tablet 12 mg  12 mg Oral QHS Armandina Stammer I, NP   12 mg at 03/14/23 2107   traZODone (DESYREL) tablet 300 mg  300 mg Oral QHS Armandina Stammer I, NP   300 mg at 03/14/23 2108    Lab Results: No results found for this or any previous visit (from the past 48 hour(s)).  Blood Alcohol level:  Lab Results  Component Value Date   ETH <10 03/09/2023   ETH <10 10/05/2022    Metabolic Disorder Labs: Lab Results  Component Value Date   HGBA1C 5.7 (H) 10/07/2022   MPG 117 10/07/2022   MPG 114 10/05/2022   Lab Results  Component Value Date   PROLACTIN 17.4 10/05/2022   PROLACTIN 45.8 (H) 12/19/2017   Lab Results  Component Value Date   CHOL 156 10/05/2022   TRIG 116 10/05/2022   HDL 40 (L) 10/05/2022   CHOLHDL 3.9 10/05/2022   VLDL 23 10/05/2022   LDLCALC 93 10/05/2022   LDLCALC 69 11/21/2020    Physical Findings: AIMS:  , ,  ,  ,    CIWA:    COWS:     Musculoskeletal: Strength & Muscle Tone: within normal limits Gait & Station: normal Patient leans: N/A  Psychiatric Specialty Exam:  Presentation  General Appearance:  Casual  Eye Contact: Fair  Speech: Clear and Coherent  Speech Volume: Normal  Handedness: Right   Mood and Affect  Mood: -- ("fine")  Affect: Non-Congruent   Thought Process  Thought Processes: Disorganized  Descriptions of Associations:Tangential  Orientation:Full (Time, Place and Person)  Thought Content:Delusions;  Scattered; Illogical  History of Schizophrenia/Schizoaffective disorder:Yes  Duration of Psychotic Symptoms:Greater than six months  Hallucinations:Hallucinations: None  Ideas of Reference:Delusions; Paranoia  Suicidal Thoughts:Suicidal Thoughts: No  Homicidal Thoughts:Homicidal Thoughts: No   Sensorium  Memory: Immediate Fair; Recent Fair  Judgment: Impaired  Insight: Shallow   Executive Functions  Concentration: Poor  Attention Span: Poor  Recall: Poor  Fund of Knowledge: Poor  Language: Fair   Psychomotor Activity  Psychomotor Activity:Psychomotor Activity: Restlessness   Assets  Assets: Resilience; Social Support; Desire for Improvement; Financial Resources/Insurance   Sleep  Sleep:Sleep: Good Number of Hours of Sleep: 6.75    Physical Exam: Physical Exam Vitals and nursing note reviewed.  Constitutional:      General: He is not in acute distress.    Appearance: Normal appearance. He is normal weight. He is not ill-appearing or toxic-appearing.  HENT:     Head: Normocephalic and atraumatic.  Pulmonary:     Effort: Pulmonary effort is normal.  Musculoskeletal:        General: Normal range of motion.  Neurological:     General: No focal deficit present.     Mental Status: He is alert.    Review of Systems  Respiratory:  Negative for cough and shortness of breath.   Cardiovascular:  Negative for chest pain.  Gastrointestinal:  Negative for abdominal pain, constipation, diarrhea, nausea and vomiting.  Neurological:  Negative for dizziness, weakness and headaches.  Psychiatric/Behavioral:  Negative for depression, hallucinations and suicidal ideas. The patient is not nervous/anxious.    Blood pressure 120/88, pulse 83, temperature (!) 97.5 F (36.4 C), temperature source Oral, resp. rate 16, height 5\' 3"  (1.6 m), weight 70 kg, SpO2 97%. Body mass index is 27.35 kg/m.  Treatment Plan Summary: Daily contact with patient to assess  and evaluate symptoms and progress in treatment and Medication management  Mads Lorenc is a 42 yr old male who presented on 9/3 to [facility] with delusions and paranoia, he was admitted to [facility] on 9/4.  PPHx is significant for Schizoaffective Disorder, Bipolar Type, Anxiety, Depression, and Polysubstance Abuse (Cocaine, Meth, EtOH, and an extensive history of aggression, and 6 Prior Psychiatric Hospitalizations (last [facility]- 10/2022), and no history of Suicide Attempts or Self Injurious Behavior. He does have an ACT Team- [facility].    Bohden has had no response to the increase in lithium and continues to be very disorganized and hypersexual.  Due to this we will stop his lithium and we will start Depakote.  We will not make any other changes to his medications at this time.  We will continue to monitor.   Schizoaffective Disorder, Bipolar Type: -Continue Invega 12 mg QHS for psychosis and mood stability -Continue Zyprexa 5 mg AM and 15 mg QHS for psychosis and mood stability -Stop Lithium. -Start Depakote DR 750 mg BID for mood stability. -Continue Agitation Protocol: Zyprexa/Ativan, Zyprexa.Benadryl, and Benadryl   -Continue Gabapentin 300 mg TID  -Continue Trazodone 300 mg QHS for sleep -Continue PRN's: Tylenol, Maalox, Atarax, Milk of Magnesia   Lauro Franklin, MD 03/15/2023, 12:52 PM

## 2023-03-15 NOTE — Plan of Care (Signed)
?  Problem: Education: Goal: Emotional status will improve Outcome: Not Progressing Goal: Mental status will improve Outcome: Not Progressing   Problem: Coping: Goal: Ability to demonstrate self-control will improve Outcome: Not Progressing   

## 2023-03-15 NOTE — Group Note (Signed)
[facility] LCSW Group Therapy Note   Group Date: 03/15/2023 Start Time: 1300 End Time: 1400   Type of Therapy/Topic:  Group Therapy:  Emotion Regulation  Participation Level:  Active    Description of Group:    The purpose of this group is to assist patients in learning to regulate negative emotions and experience positive emotions. Patients will be guided to discuss ways in which they have been vulnerable to their negative emotions. These vulnerabilities will be juxtaposed with experiences of positive emotions or situations, and patients challenged to use positive emotions to combat negative ones. Special emphasis will be placed on coping with negative emotions in conflict situations, and patients will process healthy conflict resolution skills.  Therapeutic Goals: Patient will identify two positive emotions or experiences to reflect on in order to balance out negative emotions:  Patient will label two or more emotions that they find the most difficult to experience:  Patient will be able to demonstrate positive conflict resolution skills through discussion or role plays:   Summary of Patient Progress:   Patient came to group and accepted the provided worksheets. Patient shared some insightful things that he likes to do when he is regulating his emotions. Patient did make an inappropriate comment about jacking off and how he like to do drugs to help his body stay positive. Patient left and came back and then left again before group was over.     Therapeutic Modalities:   Cognitive Behavioral Therapy Feelings Identification Dialectical Behavioral Therapy   Isabella Bowens, LCSWA

## 2023-03-15 NOTE — [facility] Group Notes (Signed)
Adult Psychoeducational Group Note  Date:  03/15/2023 Time:  8:20 PM  Group Topic/Focus:  Wrap-Up Group:   The focus of this group is to help patients review their daily goal of treatment and discuss progress on daily workbooks.  Participation Level:  Active  Participation Quality:  Intrusive, Inattentive, and Redirectable  Affect:  Excited and Not Congruent  Cognitive:  Disorganized and Delusional  Insight: Limited  Engagement in Group:  Distracting, Off Topic, and Poor  Modes of Intervention:  Discussion  Additional Comments:   Pt states that he had a good day and has been singing and dancing all day to "lift spirits". Pt states that he attended groups and participated in recreational activities. Pt is hypersexual during group and continues to make statements about genitals and his "backside". Pt had to be redirected often. Pt denied everything   Vevelyn Pat 03/15/2023, 8:20 PM

## 2023-03-15 NOTE — Group Note (Signed)
Recreation Therapy Group Note   Group Topic:Healthy Decision Making  Group Date: 03/15/2023 Start Time: 1020 End Time: 1040 Facilitators: Kimberla Driskill-McCall, LRT,CTRS Location: [address] Dayroom   Group Topic: Decision Making, Problem Solving, Communication  Goal Area(s) Addresses:  Patient will effectively work with peer towards shared goal.  Patient will identify factors that guided their decision making.  Patient will pro-socially communicate ideas during group session.   Intervention: Survival Scenario - pencil, paper  Group Description: Patients were given a scenario that they were going to be stranded on a deserted [place] for several months before being rescued. Writer tasked them with making a list of 15 things they would choose to bring with them for "survival". The list of items was prioritized most important to least. Each patient would come up with their own list, then work together to create a new list of 15 items while in a group of 3-5 peers. LRT discussed each person's list and how it differed from others. The debrief included discussion of priorities, good decisions versus bad decisions, and how it is important to think before acting so we can make the best decision possible. LRT tied the concept of effective communication among group members to patient's support systems outside of the hospital and its benefit post discharge.  Education: Pharmacist, community, Priorities, Support System, Discharge Planning    Affect/Mood: Inappropriate   Participation Level: Minimal   Participation Quality: Maximum Cues   Behavior: Disruptive and Inappropriate    Speech/Thought Process: Disorganized and Irrational   Insight: Lacking   Judgement: Lacking    Modes of Intervention: Activity   Patient Response to Interventions:  Challenging    Education Outcome:  In group clarification offered    Clinical Observations/Individualized Feedback: Pt needed constant redirection. Pt was  constantly talking and making gestures. Pt was eventually put out of group because of his inappropriate behavior and response the questions in the activity.    Plan: Continue to engage patient in RT group sessions 2-3x/week.   Johan Antonacci-McCall, LRT,CTRS  03/15/2023 1:01 PM

## 2023-03-15 NOTE — Group Note (Signed)
Date:  03/15/2023 Time:  9:01 AM  Group Topic/Focus:  Goals Group:   The focus of this group is to help patients establish daily goals to achieve during treatment and discuss how the patient can incorporate goal setting into their daily lives to aide in recovery.    Participation Level:  Did Not Attend    Donell Beers 03/15/2023, 9:01 AM

## 2023-03-16 NOTE — Plan of Care (Signed)
  Problem: Safety: Goal: Periods of time without injury will increase Outcome: Progressing   

## 2023-03-16 NOTE — Progress Notes (Signed)
Pt's mood remains labile with loud explosive outbursts when redirected from not going into peer's room and to stay in dayroom during scheduled activities. Pt became increasing agitated with writer "You fucking African bitch, I hate you bitch. I will murder your fucking ass. I have charges for murder bitch, don't tell me when the fuck to do right now". Verbal redirections ineffective at the time. Agitation protocol given (Zyprexa and Benadryl) both IM. 1:1 observation maintained with assigned staff in attendance. Support, encouragement and reassurance offered.
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
## 2023-03-16 NOTE — Group Note (Signed)
Date:  03/16/2023 Time:  9:46 AM  Group Topic/Focus:  Goals Group:   The focus of this group is to help patients establish daily goals to achieve during treatment and discuss how the patient can incorporate goal setting into their daily lives to aide in recovery.    Participation Level:  Active  Participation Quality:  Appropriate  Affect:  Appropriate  Cognitive:  Appropriate  Insight: Appropriate  Engagement in Group:  Engaged  Modes of Intervention:  Discussion  Additional Comments:  Pt attended group and discussion  Octavio Manns 03/16/2023, 9:46 AM

## 2023-03-16 NOTE — [facility] Group Notes (Signed)
Pt was encouraged to attend wrap up group but refused

## 2023-03-16 NOTE — Progress Notes (Signed)
   03/16/23 0900  Psych Admission Type (Psych Patients Only)  Admission Status Involuntary  Psychosocial Assessment  Patient Complaints Hyperactivity;Restlessness  Eye Contact Fair  Facial Expression Animated  Affect Preoccupied  Speech Tangential  Interaction Intrusive;Sexually inappropriate;Flirtatious  Motor Activity Fidgety;Pacing;Restless  Appearance/Hygiene Disheveled  Behavior Characteristics Impulsive;Hypersexual;Intrusive;Hyperactive  Mood Preoccupied;Labile  Thought Process  Coherency Disorganized;Flight of ideas  Content Preoccupation  Delusions Erotomanic;Grandeur  Perception WDL  Hallucination None reported or observed  Judgment Impaired  Confusion None  Danger to Self  Current suicidal ideation? Denies  Danger to Others  Danger to Others None reported or observed

## 2023-03-16 NOTE — Progress Notes (Signed)
Pt on 1:1 at this time for impulsive and intrusive behavior. Pt requires frequent redirection today and received agitation protocol for boisterous and threatening behavior towards staff. Pt screaming " I've already murdered people!" As he showed his tattoos and slapped hisself on his arms. Pt posturing at staff as well.

## 2023-03-16 NOTE — Progress Notes (Signed)
   03/16/23 0121  Psych Admission Type (Psych Patients Only)  Admission Status Involuntary  Psychosocial Assessment  Patient Complaints Restlessness  Eye Contact Fair  Facial Expression Animated  Affect Preoccupied  Speech Tangential  Interaction Intrusive  Motor Activity Pacing;Restless  Appearance/Hygiene Layered clothes  Behavior Characteristics Fidgety  Mood Preoccupied  Thought Process  Coherency Disorganized  Content Preoccupation;Paranoia  Delusions Paranoid  Perception WDL  Hallucination None reported or observed  Judgment Impaired  Confusion Mild  Danger to Self  Current suicidal ideation? Denies  Danger to Others  Danger to Others None reported or observed

## 2023-03-16 NOTE — Group Note (Signed)
Recreation Therapy Group Note   Group Topic:Leisure Education  Group Date: 03/16/2023 Start Time: 1040 End Time: 1115 Facilitators: Larsen Dungan-McCall, LRT,CTRS Location: [address] Dayroom   Goal Area(s) Addresses:  Patient will identify positive leisure activities.  Patient will identify one positive benefit of participation in leisure activities.   Intervention: Leisure Group Game  Group Description: Patient and LRT participated in playing a game of [employer]. LRT timed the patients as they hit the beach ball to each other for as long as they can. If the ball were to a complete stop, LRT would start the time over.  Education: Leisure Exposure, Pharmacist, community, Discharge Planning  Education Outcome: Acknowledges education/In group clarification offered/Needs additional education   Affect/Mood: N/A   Participation Level: Did not attend    Clinical Observations/Individualized Feedback:    Plan: Continue to engage patient in RT group sessions 2-3x/week.   Ezekiel Menzer-McCall, LRT,CTRS  03/16/2023 1:18 PM

## 2023-03-17 DIAGNOSIS — F250 Schizoaffective disorder, bipolar type: Secondary | ICD-10-CM | POA: Diagnosis not present

## 2023-03-17 NOTE — Progress Notes (Signed)
Nursing 1:1 Note:  Patient in the hallway and at the nurses station. Patient seen changing clothes often and pacing the halls. No signs of distress noted. 1:1 monitoring continues. Patient remains safe at this time.

## 2023-03-17 NOTE — Progress Notes (Signed)
[facility] MD Progress Note  03/17/2023 9:50 AM Elbridge Dimasi  MRN:  [id number] Subjective:   [name] [name] is a 42 yr old male who presented on 9/3 to [facility] with delusions and paranoia, he was admitted to [facility] on 9/4.  PPHx is significant for Schizoaffective Disorder, Bipolar Type, Anxiety, Depression, and Polysubstance Abuse (Cocaine, Meth, EtOH, and an extensive history of aggression, and 6 Prior Psychiatric Hospitalizations (last [facility] [address]- 10/2022), and no history of Suicide Attempts or Self Injurious Behavior. He does have an ACT Team- [facility].  Daily notes: Kymir is seen in his room. Chart reviewed. The chart findings discussed with the treatment team. He present alert, highly manic & delusional. He presents with pressured speech, highly excited while holding the Quran. He reports, "I'm now a muslim. I changed my religion. Christianity is fake. They kill & eat people. Muslim eat animals & smoke stuff. I shaved my head to become a true muslim. Why am I still here? I'm taking a lot of medicines, they make my balls hurt. I can sing like a muslim. My new muslim name is Zighaim. I can see my family now looking at me. They are here now, happy for me". Trystyn remains pleasantly psychotic/manic today. He is observed walking up & down the [address] raping a song. Although obviously psychotic, manic & delusional, he denies any new issues. He continue to ask when he is going to be discharged. Discussed this case with the attending psychiatrist. Continue with the current plan of care as already in progress. Vital signs remain stable. There are no changes made on the treatment plan.  Principal Problem: Schizoaffective disorder, bipolar type (HCC) Diagnosis: Principal Problem:   Schizoaffective disorder, bipolar type (HCC) Active Problems:   Substance-induced psychotic disorder (HCC)  Past Psychiatric History: Schizoaffective Disorder, Bipolar Type, Anxiety, Depression, and Polysubstance Abuse (Cocaine, Meth, EtOH, and  an extensive history of aggression, and 6 Prior Psychiatric Hospitalizations (last [facility]- 10/2022), and no history of Suicide Attempts or Self Injurious Behavior.  He does have an ACT Team- [facility].  Past Medical History:  Past Medical History:  Diagnosis Date   Anxiety    Depression    Mental disorder    Schizophrenia (HCC)    History reviewed. No pertinent surgical history. Family History:  Family History  Problem Relation Age of Onset   Mental illness Neg Hx    Family Psychiatric  History: Reports None 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, Pt uses meth    Social History   Socioeconomic History   Marital status: Married    Spouse name: Not on file   Number of children: 1   Years of education: Not on file   Highest education level: Not on file  Occupational History   Occupation: Disabled  Tobacco Use   Smoking status: Every Day    Current packs/day: 1.00    Types: Cigarettes   Smokeless tobacco: Former  Building services engineer status: Unknown  Substance and Sexual Activity   Alcohol use: Not Currently    Comment: haven't drank in months"   Drug use: Yes    Types: Marijuana, Methamphetamines    Comment: Per sister, Pt uses meth   Sexual activity: Yes  Birth control/protection: None  Other Topics Concern   Not on file  Social History Narrative   Pt refusing to answer any questions during assessment. Pt lives with mother who IVC'd him.      Pt lives with parents and sister.  He is married, but currently separated.  Pt is on disability.   Social Determinants of Health   Financial Resource Strain: Not on file  Food Insecurity: Patient Unable To Answer (03/09/2023)   Hunger Vital Sign    Worried About Running Out of Food in the Last Year:  Patient unable to answer    Ran Out of Food in the Last Year: Patient unable to answer  Transportation Needs: Patient Unable To Answer (03/09/2023)   PRAPARE - Administrator, Civil Service (Medical): Patient unable to answer    Lack of Transportation (Non-Medical): Patient unable to answer  Physical Activity: Not on file  Stress: Not on file  Social Connections: Not on file   Additional Social History:   Sleep: Good  Appetite:  Good  Current Medications: Current Facility-Administered Medications  Medication Dose Route Frequency Provider Last Rate Last Admin   acetaminophen (TYLENOL) tablet 650 mg  650 mg Oral Q6H PRN Ardis Hughs, NP       alum & mag hydroxide-simeth (MAALOX/MYLANTA) 200-200-20 MG/5ML suspension 30 mL  30 mL Oral Q4H PRN Ardis Hughs, NP       diphenhydrAMINE (BENADRYL) capsule 50 mg  50 mg Oral TID PRN Armandina Stammer I, NP       OLANZapine (ZYPREXA) injection 5 mg  5 mg Intramuscular TID PRN Bobbitt, Shalon E, NP   5 mg at 03/16/23 1052   And   diphenhydrAMINE (BENADRYL) injection 50 mg  50 mg Intramuscular TID PRN Bobbitt, Shalon E, NP   50 mg at 03/16/23 1052   divalproex (DEPAKOTE) DR tablet 750 mg  750 mg Oral BID Lauro Franklin, MD   750 mg at 03/17/23 0747   gabapentin (NEURONTIN) capsule 300 mg  300 mg Oral TID Armandina Stammer I, NP   300 mg at 03/17/23 0747   hydrOXYzine (ATARAX) tablet 50 mg  50 mg Oral Q4H PRN Armandina Stammer I, NP   50 mg at 03/16/23 1434   OLANZapine (ZYPREXA) tablet 5 mg  5 mg Oral TID PRN Armandina Stammer I, NP       And   LORazepam (ATIVAN) tablet 1 mg  1 mg Oral TID PRN Armandina Stammer I, NP       magnesium hydroxide (MILK OF MAGNESIA) suspension 30 mL  30 mL Oral Daily PRN Ardis Hughs, NP       OLANZapine (ZYPREXA) tablet 15 mg  15 mg Oral QHS Fatuma Dowers I, NP   15 mg at 03/16/23 1944   OLANZapine (ZYPREXA) tablet 5 mg  5 mg Oral Daily Armandina Stammer I, NP   5 mg at 03/17/23 0747   paliperidone (INVEGA) 24 hr  tablet 12 mg  12 mg Oral QHS Sherma Vanmetre, Nicole Kindred I, NP   12 mg at 03/16/23 1945   traZODone (DESYREL) tablet 300 mg  300 mg Oral QHS Armandina Stammer I, NP   300 mg at 03/16/23 1945   Lab Results: No results found for this or any previous visit (from the past 48 hour(s)).  Blood Alcohol level:  Lab Results  Component Value Date   [facility] <10 03/09/2023   ETH <10 10/05/2022   Metabolic Disorder Labs: Lab Results  Component Value  Date   HGBA1C 5.7 (H) 10/07/2022   MPG 117 10/07/2022   MPG 114 10/05/2022   Lab Results  Component Value Date   PROLACTIN 17.4 10/05/2022   PROLACTIN 45.8 (H) 12/19/2017   Lab Results  Component Value Date   CHOL 156 10/05/2022   TRIG 116 10/05/2022   HDL 40 (L) 10/05/2022   CHOLHDL 3.9 10/05/2022   VLDL 23 10/05/2022   LDLCALC 93 10/05/2022   LDLCALC 69 11/21/2020   Physical Findings: AIMS:  , ,  ,  ,    CIWA:    COWS:     Musculoskeletal: Strength & Muscle Tone: within normal limits Gait & Station: normal Patient leans: N/A  Psychiatric Specialty Exam:  Presentation  General Appearance:  Casual  Eye Contact: Good (increased blinking)  Speech: Clear and Coherent; Normal Rate  Speech Volume: Normal  Handedness: Right   Mood and Affect  Mood: -- ("good")  Affect: Restricted; Labile; Inappropriate   Thought Process  Thought Processes: Disorganized  Descriptions of Associations:Tangential  Orientation:Full (Time, Place and Person)  Thought Content:Delusions; Illogical; Scattered  History of Schizophrenia/Schizoaffective disorder:Yes  Duration of Psychotic Symptoms:Greater than six months  Hallucinations:Hallucinations: None  Ideas of Reference:Delusions; Paranoia; Percusatory  Suicidal Thoughts:Suicidal Thoughts: No  Homicidal Thoughts:Homicidal Thoughts: No   Sensorium  Memory: Immediate Fair  Judgment: Impaired  Insight: Shallow   Executive Functions  Concentration: Fair  Attention  Span: Fair  Recall: Poor  Fund of Knowledge: Poor  Language: Fair  Psychomotor Activity  Psychomotor Activity:Psychomotor Activity: Restlessness  Assets  Assets: Desire for Improvement; Social Support; Resilience; Financial Resources/Insurance  Sleep  Sleep:Sleep: Good Number of Hours of Sleep: 7.5  Physical Exam: Physical Exam Vitals and nursing note reviewed.  Constitutional:      General: He is not in acute distress.    Appearance: Normal appearance. He is normal weight. He is not ill-appearing or toxic-appearing.  HENT:     Head: Normocephalic and atraumatic.  Pulmonary:     Effort: Pulmonary effort is normal.  Musculoskeletal:        General: Normal range of motion.  Neurological:     General: No focal deficit present.     Mental Status: He is alert.    Review of Systems  Respiratory:  Negative for cough and shortness of breath.   Cardiovascular:  Negative for chest pain.  Gastrointestinal:  Negative for abdominal pain, constipation, diarrhea, nausea and vomiting.  Neurological:  Negative for dizziness, weakness and headaches.  Psychiatric/Behavioral:  Negative for depression, hallucinations and suicidal ideas. The patient is not nervous/anxious.    Blood pressure 124/69, pulse 90, temperature (!) 97.4 F (36.3 C), temperature source Oral, resp. rate 16, height 5\' 3"  (1.6 m), weight 70 kg, SpO2 99%. Body mass index is 27.35 kg/m.  Treatment Plan Summary: Daily contact with patient to assess and evaluate symptoms and progress in treatment and Medication management.   Continue inpatient hospitalization.  Will continue today 03/17/2023 plan as below except where it is noted.   Schizoaffective Disorder, Bipolar Type: -Continue Invega 12 mg QHS for psychosis and mood stability -Continue Zyprexa 5 mg AM and 15 mg QHS for psychosis and mood stability -Continue Depakote DR 750 mg BID for mood stability. -Continue Agitation Protocol: Zyprexa/Ativan,  Zyprexa.Benadryl, and Benadryl -Place on 1:1 -Referral to [facility]  -Continue Gabapentin 300 mg TID  -Continue Trazodone 300 mg QHS for sleep -Continue PRN's: Tylenol, Maalox, Atarax, Milk of Magnesia.    Safety and Monitoring: Voluntary admission to inpatient  psychiatric unit for safety, stabilization and treatment Daily contact with patient to assess and evaluate symptoms and progress in treatment Patient's case to be discussed in multi-disciplinary team meeting Observation Level : q15 minute checks Vital signs: q12 hours Precautions: Safety   Discharge Planning: Social work and case management to assist with discharge planning and identification of hospital follow-up needs prior to discharge Estimated LOS: 5-7 days Discharge Concerns: Need to establish a safety plan; Medication compliance and effectiveness Discharge Goals: Return home with outpatient referrals for mental health follow-up including medication management/psychotherapy  Armandina Stammer, NP, pmhnp, fnp-bc. 03/17/2023, 9:50 AM Patient ID: [name], male   DOB: 13-Dec-1980, 42 y.o.   MRN: [id number]

## 2023-03-17 NOTE — Plan of Care (Signed)
°  Problem: Education: °Goal: Emotional status will improve °Outcome: Progressing °Goal: Mental status will improve °Outcome: Progressing °Goal: Verbalization of understanding the information provided will improve °Outcome: Progressing °  °

## 2023-03-17 NOTE — [facility] Group Notes (Signed)
Adult Psychoeducational Group Note  Date:  03/17/2023 Time:  10:10 AM  Group Topic/Focus:  Goals Group:   The focus of this group is to help patients establish daily goals to achieve during treatment and discuss how the patient can incorporate goal setting into their daily lives to aide in recovery. Orientation:   The focus of this group is to educate the patient on the purpose and policies of crisis stabilization and provide a format to answer questions about their admission.  The group details unit policies and expectations of patients while admitted.  Participation Level:  Active  Participation Quality:  Appropriate  Affect:  Appropriate  Cognitive:  Disorganized  Insight: Lacking  Engagement in Group:  Lacking  Modes of Intervention:  Discussion  Additional Comments:  Pt attended the goals group and remained appropriate and engaged throughout the duration of the group.   Fara Olden O 03/17/2023, 10:10 AM

## 2023-03-17 NOTE — Progress Notes (Signed)
   03/17/23 2000  Psych Admission Type (Psych Patients Only)  Admission Status Involuntary  Psychosocial Assessment  Patient Complaints Hyperactivity  Eye Contact Fair  Facial Expression Animated  Affect Preoccupied  Speech Tangential  Interaction Sexually inappropriate;Intrusive  Motor Activity Restless;Pacing;Fidgety  Appearance/Hygiene Disheveled  Behavior Characteristics Intrusive;Impulsive;Hyperactive  Mood Preoccupied  Thought Process  Coherency Disorganized;Flight of ideas  Content Preoccupation  Delusions Erotomanic;Grandeur  Perception WDL  Hallucination None reported or observed  Judgment Impaired  Confusion None  Danger to Self  Current suicidal ideation? Denies  Danger to Others  Danger to Others None reported or observed

## 2023-03-17 NOTE — Group Note (Signed)
Recreation Therapy Group Note   Group Topic:Team Building  Group Date: 03/17/2023 Start Time: 1015 End Time: 1110 Facilitators: Quamaine Webb-McCall, LRT,CTRS Location: [address] [employer] Therapy Notes  Group Topic: Communication, Team Building, Problem Solving  Goal Area(s) Addresses:  Patient will effectively work with peer towards shared goal.  Patient will identify skills used to make activity successful.  Patient will identify how skills used during activity can be applied to reach post d/c goals.   Intervention: STEM Activity- Glass blower/designer  Group Description: Tallest Pharmacist, community. In teams of 5-6, patients were given 11 craft pipe cleaners. Using the materials provided, patients were instructed to compete again the opposing team(s) to build the tallest free-standing structure from floor level. The activity was timed; difficulty increased by Clinical research associate as Production designer, theatre/television/film continued.  Systematically resources were removed with additional directions for example, placing one arm behind their back, working in silence, and shape stipulations. LRT facilitated post-activity discussion reviewing team processes and necessary communication skills involved in completion. Patients were encouraged to reflect how the skills utilized, or not utilized, in this activity can be incorporated to positively impact support systems post discharge.  Education: Pharmacist, community, Scientist, physiological, Discharge Planning   Education Outcome: Acknowledges education/In group clarification offered/Needs additional education.    Affect/Mood: Anxious   Participation Level: Minimal   Participation Quality: Moderate Cues   Behavior: Restless   Speech/Thought Process: Disorganized   Insight: Lacking   Judgement: Lacking    Modes of Intervention: STEM Activity   Patient Response to Interventions:  Disengaged   Education Outcome:  In group clarification offered    Clinical  Observations/Individualized Feedback: Pt had to be redirected numerous times during group session. Pt was restless at times/unable to sit still. Pt made no sense when talking. Pt left early and did not return.      Plan: Continue to engage patient in RT group sessions 2-3x/week.   [name] [name], LRT,CTRS 03/17/2023 1:31 PM

## 2023-03-17 NOTE — Progress Notes (Signed)
Nursing 1:1 Note  Patient walking halls. No distress noted. 1:1 monitoring continues. Patient remains safe at this time.

## 2023-03-17 NOTE — Progress Notes (Signed)
Nursing 1:1 Note  Patient walking the halls interacting with staff and peers. No distress noted. PRN medication administration effective. 1:1 monitoring continues. Patient remains safe at this time.

## 2023-03-17 NOTE — Progress Notes (Addendum)
Pt remains manic, hypersexual, stating '' I'm muslim now, do you have a husband , I'm muslim so I can have more than one wife, I want you as my wife. '' Pt then intrusive with another peer rapping in his face, but was easily redirected at this time. Pt received prn agitation protocol medication for above as pt becoming more intrusive with peers and restless.

## 2023-03-17 NOTE — [facility] Group Notes (Signed)
Pt was encouraged but refused to attend group discussion  

## 2023-03-17 NOTE — Progress Notes (Signed)

## 2023-03-18 DIAGNOSIS — F250 Schizoaffective disorder, bipolar type: Secondary | ICD-10-CM | POA: Diagnosis not present

## 2023-03-18 NOTE — [facility] Group Notes (Signed)
Adult Psychoeducational Group Note  Date:  03/18/2023 Time:  7:39 PM  Group Topic/Focus:  Goals Group:   The focus of this group is to help patients establish daily goals to achieve during treatment and discuss how the patient can incorporate goal setting into their daily lives to aide in recovery. Orientation:   The focus of this group is to educate the patient on the purpose and policies of crisis stabilization and provide a format to answer questions about their admission.  The group details unit policies and expectations of patients while admitted.  Participation Level:  Active  Participation Quality:  Monopolizing  Affect:  Not Congruent  Cognitive:  Disorganized  Insight: None  Engagement in Group:  Engaged  Modes of Intervention:  Discussion  Additional Comments:  Pt appeared confused throughout the course of the group.   Sheran Lawless 03/18/2023, 7:39 PM

## 2023-03-18 NOTE — Progress Notes (Signed)
   03/18/23 2024  Psych Admission Type (Psych Patients Only)  Admission Status Involuntary  Psychosocial Assessment  Patient Complaints Hyperactivity  Eye Contact Fair  Facial Expression Animated  Affect Preoccupied  Speech Tangential  Interaction Intrusive;Sexually inappropriate  Motor Activity Restless;Pacing  Appearance/Hygiene Disheveled  Behavior Characteristics Impulsive;Intrusive  Mood Preoccupied  Thought Process  Coherency Disorganized;Flight of ideas  Content Preoccupation  Delusions Erotomanic;Grandeur  Perception WDL  Hallucination None reported or observed  Judgment Impaired  Confusion None  Danger to Self  Current suicidal ideation? Denies  Danger to Others  Danger to Others None reported or observed
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
## 2023-03-18 NOTE — Progress Notes (Signed)
[facility] MD Progress Note  03/18/2023 9:39 AM [name] [name]  MRN:  [id number] Subjective:   [name] [name] is a 42 yr old male who presented on 9/3 to [facility] with delusions and paranoia, he was admitted to [facility] on 9/4.  PPHx is significant for Schizoaffective Disorder, Bipolar Type, Anxiety, Depression, and Polysubstance Abuse (Cocaine, Meth, EtOH, and an extensive history of aggression, and 6 Prior Psychiatric Hospitalizations (last [facility]- 10/2022), and no history of Suicide Attempts or Self Injurious Behavior. He does have an ACT Team- [facility].  Daily notes: Ranald is seen in his room. Chart reviewed. The chart findings discussed with the treatment team. He present alert, oriented x 2. He remains manic, walking up & down the unit hall-way holding the holy bible & rapping songs. He reports, "I'm back to being a christian again. I now practice Buddhism. My mood is good. I will sleep better if I can smoke a cigarette before I go to bed". The staff reports that patient continues to display aggressive behaviors requiring him to receive the agitation protocols. Patient is switched from Lithium to Depakote to control his mania. Although manic & hyper-religious, staff continues to work with & redirect patient. He remains on 1:1 supervision due to his manic/aggressive behaviors. There are no changes made on his current plan of care. Will continue as already in progress.   Principal Problem: Schizoaffective disorder, bipolar type (HCC) Diagnosis: Principal Problem:   Schizoaffective disorder, bipolar type (HCC) Active Problems:   Substance-induced psychotic disorder (HCC)  Past Psychiatric History: Schizoaffective Disorder, Bipolar Type, Anxiety, Depression, and Polysubstance Abuse (Cocaine, Meth, EtOH, and an extensive history of aggression, and 6 Prior Psychiatric Hospitalizations (last [facility]- 10/2022), and no history of Suicide Attempts or Self Injurious Behavior.  He does have an ACT Team- [facility].  Past  Medical History:  Past Medical History:  Diagnosis Date   Anxiety    Depression    Mental disorder    Schizophrenia (HCC)    History reviewed. No pertinent surgical history. Family History:  Family History  Problem Relation Age of Onset   Mental illness Neg Hx    Family Psychiatric  History: Reports None 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, Pt uses meth    Social History   Socioeconomic History   Marital status: Married    Spouse name: Not on file   Number of children: 1   Years of education: Not on file   Highest education level: Not on file  Occupational History   Occupation: Disabled  Tobacco Use   Smoking status: Every Day    Current packs/day: 1.00    Types: Cigarettes   Smokeless tobacco: Former  Building services engineer status: Unknown  Substance and Sexual Activity   Alcohol use: Not Currently    Comment: haven't drank in months"   Drug use: Yes    Types: Marijuana, Methamphetamines    Comment: 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 Pt lives with mother who IVC'd him.      Pt lives with parents and  sister.  He is married, but currently separated.  Pt is on disability.   Social Determinants of Health   Financial Resource Strain: Not on file  Food Insecurity: Patient Unable To Answer (03/09/2023)   Hunger Vital Sign    Worried About Running Out of Food in the Last Year: Patient unable to answer    Ran Out of Food in the Last Year: Patient unable to answer  Transportation Needs: Patient Unable To Answer (03/09/2023)   PRAPARE - Administrator, Civil Service (Medical):  Patient unable to answer    Lack of Transportation (Non-Medical): Patient unable to answer  Physical Activity: Not on file  Stress: Not on file  Social Connections: Not on file   Additional Social History:   Sleep: Good  Appetite:  Good  Current Medications: Current Facility-Administered Medications  Medication Dose Route Frequency Provider Last Rate Last Admin   acetaminophen (TYLENOL) tablet 650 mg  650 mg Oral Q6H PRN Ardis Hughs, NP       alum & mag hydroxide-simeth (MAALOX/MYLANTA) 200-200-20 MG/5ML suspension 30 mL  30 mL Oral Q4H PRN Ardis Hughs, NP       diphenhydrAMINE (BENADRYL) capsule 50 mg  50 mg Oral TID PRN Armandina Stammer I, NP   50 mg at 03/17/23 1141   OLANZapine (ZYPREXA) injection 5 mg  5 mg Intramuscular TID PRN Bobbitt, Shalon E, NP   5 mg at 03/16/23 1052   And   diphenhydrAMINE (BENADRYL) injection 50 mg  50 mg Intramuscular TID PRN Bobbitt, Shalon E, NP   50 mg at 03/16/23 1052   divalproex (DEPAKOTE) DR tablet 750 mg  750 mg Oral BID Lauro Franklin, MD   750 mg at 03/18/23 0820   gabapentin (NEURONTIN) capsule 300 mg  300 mg Oral TID Armandina Stammer I, NP   300 mg at 03/18/23 2706   hydrOXYzine (ATARAX) tablet 50 mg  50 mg Oral Q4H PRN Armandina Stammer I, NP   50 mg at 03/17/23 2001   OLANZapine (ZYPREXA) tablet 5 mg  5 mg Oral TID PRN Armandina Stammer I, NP   5 mg at 03/17/23 1141   And   LORazepam (ATIVAN) tablet 1 mg  1 mg Oral TID PRN Armandina Stammer I, NP   1 mg at 03/17/23 1141   magnesium hydroxide (MILK OF MAGNESIA) suspension 30 mL  30 mL Oral Daily PRN Ardis Hughs, NP       OLANZapine (ZYPREXA) tablet 15 mg  15 mg Oral QHS Shandell Giovanni, Nicole Kindred I, NP   15 mg at 03/17/23 2001   OLANZapine (ZYPREXA) tablet 5 mg  5 mg Oral Daily Armandina Stammer I, NP   5 mg at 03/18/23 2376   paliperidone (INVEGA) 24 hr tablet 12 mg  12 mg Oral QHS Jasani Lengel, Nicole Kindred I, NP   12 mg at 03/17/23 2002   traZODone (DESYREL) tablet 300 mg  300 mg Oral QHS Armandina Stammer I, NP   300 mg  at 03/17/23 2002   Lab Results: No results found for this or any previous visit (from the past 48 hour(s)).  Blood Alcohol level:  Lab Results  Component Value Date   [facility] <10 03/09/2023   ETH <10 10/05/2022   Metabolic Disorder Labs: Lab Results  Component Value Date   HGBA1C 5.7 (H) 10/07/2022   MPG 117 10/07/2022   MPG 114 10/05/2022   Lab Results  Component Value Date   PROLACTIN 17.4 10/05/2022   PROLACTIN 45.8 (  H) 12/19/2017   Lab Results  Component Value Date   CHOL 156 10/05/2022   TRIG 116 10/05/2022   HDL 40 (L) 10/05/2022   CHOLHDL 3.9 10/05/2022   VLDL 23 10/05/2022   LDLCALC 93 10/05/2022   LDLCALC 69 11/21/2020   Physical Findings: AIMS:  , ,  ,  ,    CIWA:    COWS:     Musculoskeletal: Strength & Muscle Tone: within normal limits Gait & Station: normal Patient leans: N/A  Psychiatric Specialty Exam:  Presentation  General Appearance:  Casual  Eye Contact: Good (increased blinking)  Speech: Clear and Coherent; Normal Rate  Speech Volume: Normal  Handedness: Right   Mood and Affect  Mood: -- ("good")  Affect: Restricted; Labile; Inappropriate   Thought Process  Thought Processes: Disorganized  Descriptions of Associations:Tangential  Orientation:Full (Time, Place and Person)  Thought Content:Delusions; Illogical; Scattered  History of Schizophrenia/Schizoaffective disorder:Yes  Duration of Psychotic Symptoms:Greater than six months  Hallucinations:No data recorded  Ideas of Reference:Delusions; Paranoia; Percusatory  Suicidal Thoughts:No data recorded  Homicidal Thoughts:No data recorded   Sensorium  Memory: Immediate Fair  Judgment: Impaired  Insight: Shallow   Executive Functions  Concentration: Fair  Attention Span: Fair  Recall: Poor  Fund of Knowledge: Poor  Language: Fair  Psychomotor Activity  Psychomotor Activity:No data recorded  Assets  Assets: Desire for Improvement;  Social Support; Resilience; Financial Resources/Insurance  Sleep  Sleep:No data recorded  Physical Exam: Physical Exam Vitals and nursing note reviewed.  Constitutional:      General: He is not in acute distress.    Appearance: Normal appearance. He is normal weight. He is not ill-appearing or toxic-appearing.  HENT:     Head: Normocephalic and atraumatic.     Mouth/Throat:     Pharynx: Oropharynx is clear.  Cardiovascular:     Rate and Rhythm: Normal rate.     Pulses: Normal pulses.  Pulmonary:     Effort: Pulmonary effort is normal.  Genitourinary:    Comments: Deferred Musculoskeletal:        General: Normal range of motion.  Neurological:     General: No focal deficit present.     Mental Status: He is alert and oriented to person, place, and time.    Review of Systems  Constitutional:  Negative for chills, diaphoresis and fever.  HENT:  Negative for congestion and sore throat.   Respiratory:  Negative for cough and shortness of breath.   Cardiovascular:  Negative for chest pain.  Gastrointestinal:  Negative for abdominal pain, constipation, diarrhea, nausea and vomiting.  Neurological:  Negative for dizziness, weakness and headaches.  Psychiatric/Behavioral:  Negative for depression, hallucinations and suicidal ideas. The patient is not nervous/anxious.    Blood pressure 135/86, pulse 92, temperature 98 F (36.7 C), temperature source Oral, resp. rate 16, height 5\' 3"  (1.6 m), weight 70 kg, SpO2 97%. Body mass index is 27.35 kg/m.  Treatment Plan Summary: Daily contact with patient to assess and evaluate symptoms and progress in treatment and Medication management.   Continue inpatient hospitalization.  Will continue today 03/18/2023 plan as below except where it is noted.   Schizoaffective Disorder, Bipolar Type: -Continue Invega 12 mg QHS for psychosis and mood stability -Continue Zyprexa 5 mg AM and 15 mg QHS for psychosis and mood stability -Continue Depakote  DR 750 mg BID for mood stability. -Continue Agitation Protocol: Zyprexa/Ativan, Zyprexa.Benadryl, and Benadryl -Place on 1:1 -Referral to [facility]  -Continue Gabapentin 300 mg TID  -Continue  Trazodone 300 mg QHS for sleep -Continue PRN's: Tylenol, Maalox, Atarax, Milk of Magnesia.    Safety and Monitoring: Voluntary admission to inpatient psychiatric unit for safety, stabilization and treatment Daily contact with patient to assess and evaluate symptoms and progress in treatment Patient's case to be discussed in multi-disciplinary team meeting Observation Level : q15 minute checks Vital signs: q12 hours Precautions: Safety   Discharge Planning: Social work and case management to assist with discharge planning and identification of hospital follow-up needs prior to discharge Estimated LOS: 5-7 days Discharge Concerns: Need to establish a safety plan; Medication compliance and effectiveness Discharge Goals: Return home with outpatient referrals for mental health follow-up including medication management/psychotherapy  Armandina Stammer, NP, pmhnp, fnp-bc. 03/18/2023, 9:39 AM Patient ID: [name] [name], male   DOB: [birth year], 42 y.o.   MRN: [id number] Patient ID: [name] [name], male   DOB: [birth year], 42 y.o.   MRN: [id number]

## 2023-03-18 NOTE — Plan of Care (Signed)
°  Problem: Education: °Goal: Emotional status will improve °Outcome: Progressing °Goal: Mental status will improve °Outcome: Progressing °  °Problem: Activity: °Goal: Interest or engagement in activities will improve °Outcome: Progressing °  °

## 2023-03-18 NOTE — Progress Notes (Signed)
1:1 Note: Patient maintained on constant supervision for safety.  Patient pacing the hallway, talking to himself and changing his clothes multiple times. Routine safety checks maintained.

## 2023-03-18 NOTE — Progress Notes (Signed)
1:1 Note: Patient maintained on constant supervision for safety.  Patient up and visible in milieu.  Patient still intrusive, impulsive and manic. Observed standing in front of another peer rapping.  Patient continues to need redirection on the unit.  Medications given as prescribed.  Routine safety checks maintained.  Patient is safe on the unit with supervision.

## 2023-03-18 NOTE — Progress Notes (Signed)
  1:1 Nursing Note 03/18/23 2200   Patient is presently in bed with eyes closed. Patient shows no signs of distress and is resting calmly in his room.  Patient remains on 1:1 supervision with sitter at his bedside.  Patient remains safe on the unit with q15 minute safety checks.

## 2023-03-18 NOTE — Group Note (Signed)
Occupational Therapy Group Note  Group Topic:Coping Skills  Group Date: 03/18/2023 Start Time: 1430 End Time: 1503 Facilitators: Ted Mcalpine, OT   Group Description: Group encouraged increased engagement and participation through discussion and activity focused on "Coping Ahead." Patients were split up into teams and selected a card from a stack of positive coping strategies. Patients were instructed to act out/charade the coping skill for other peers to guess and receive points for their team. Discussion followed with a focus on identifying additional positive coping strategies and patients shared how they were going to cope ahead over the weekend while continuing hospitalization stay.  Therapeutic Goal(s): Identify positive vs negative coping strategies. Identify coping skills to be used during hospitalization vs coping skills outside of hospital/at home Increase participation in therapeutic group environment and promote engagement in treatment   Participation Level: Engaged   Participation Quality: Independent   Behavior: Appropriate   Speech/Thought Process: Relevant   Affect/Mood: Appropriate   Insight: Fair   Judgement: Fair      Modes of Intervention: Education  Patient Response to Interventions:  Attentive   Plan: Continue to engage patient in OT groups 2 - 3x/week.  03/18/2023  Ted Mcalpine, OT  Kerrin Champagne, OT

## 2023-03-18 NOTE — Group Note (Signed)
Recreation Therapy Group Note   Group Topic:Health and Wellness  Group Date: 03/18/2023 Start Time: 1005 End Time: 1030 Facilitators: Kymani Laursen-McCall, LRT,CTRS Location: [address] Dayroom   Group Topic: Exercise/Wellness  Goal Area(s) Addresses:  Patient will verbalize benefit of exercise during group session. Patient will identify an exercise that can be completed post d/c. Patient will acknowledge benefits of exercise when used as a coping mechanism.   Activity: LRT and patients discussed the importance of physical activity. LRT then led patients in a series of stretches. Patients then took turns leading group in the exercises of their choosing. LRT and patients were to complete at least 30 minutes of exercise. Patients could get water and take breaks as needed.  Education: Physical Activity, Health and Wellness  Education Outcome: Acknowledges understanding/In group clarification offered/Needs additional education.    Affect/Mood: N/A   Participation Level: Did not attend    Clinical Observations/Individualized Feedback:     Plan: Continue to engage patient in RT group sessions 2-3x/week.   Sireen Halk-McCall, LRT,CTRS 03/18/2023 12:27 PM

## 2023-03-18 NOTE — Plan of Care (Signed)
  Problem: Health Behavior/Discharge Planning: Goal: Identification of resources available to assist in meeting health care needs will improve Outcome: Progressing Goal: Compliance with treatment plan for underlying cause of condition will improve Outcome: Progressing   Problem: Safety: Goal: Periods of time without injury will increase Outcome: Progressing

## 2023-03-18 NOTE — Group Note (Signed)
Date:  03/18/2023 Time:  9:14 PM     Participation Level:  Active  Participation Quality:  Intrusive and Monopolizing  Affect:  Blunted  Cognitive:  Disorganized and Delusional  Insight: Lacking  Engagement in Group:  Distracting, Monopolizing, and Off Topic  Modes of Intervention:  Discussion, Education, Limit-setting, and Reality Testing  Additional Comments:  Pt attended and participated in wrap up group this evening and rated their day a 6/10. Pt stated that they met friends today. Pt shared that they were sharing their clothing, in which writer attempted to remind the patient that we do not want patients borrowing or sharing personal items per the treatment agreement. Pt began to go off topic referring to the bible and stated that they are [facility] and that they are God. Pt began to upset other pt with these statements so group ended abruptly, due to pt not redirecting the topic. Pt mentioned that they went to group earlier but had to leave shortly after because they "had problems with me". Pt goal is to "get to heaven".   Chrisandra Netters 03/18/2023, 9:14 PM

## 2023-03-19 ENCOUNTER — Encounter (HOSPITAL_COMMUNITY): Payer: Self-pay

## 2023-03-19 DIAGNOSIS — F250 Schizoaffective disorder, bipolar type: Secondary | ICD-10-CM | POA: Diagnosis not present

## 2023-03-19 NOTE — Progress Notes (Signed)
1:1 Note: Patient maintained on constant supervision for safety. Patient appeared to be disorganized with tangential thought process.  Observed pacing the hallway talking to himself with bizarre hand gestures, disruptive in milieu.  Needed a lot of redirection. Routine safety checks maintained.

## 2023-03-19 NOTE — Progress Notes (Incomplete)
Nursing 1:1 Note   D: Pt visible in bedroom. Resting in bed with eyes closed.       Respirations noted even and unlabored. Pt drank approx. 8 oz of water in the last 30 minutes. Pt becomes agitated (grimace, sighs, shifts position) when staff attempts to interact with patient . Pt states "leave me alone".  A: Water and snack offered. Emotional support and availability provided. Minimizing stimuli. Offered rest. RN notified of patient's agitation upon interaction. Maintaining 1: 1 for safety.  R: Pt remains seated quietly. Not interactive with others. Minimally receptive to care interventions at this time. Remains safe on 1:1.

## 2023-03-19 NOTE — Progress Notes (Signed)
  1:1 Nursing Note 03/19/23 0600  Patient is asleep and breathing normally. The patient shows no signs of distress.  The patient remains on 1:1 supervision with sitter at the bedside. The patient remains safe on this unit with q15 minute safety checks.

## 2023-03-19 NOTE — Progress Notes (Signed)
[facility] MD Progress Note  03/19/2023 8:55 AM Neftaly Croucher  MRN:  [id number] Subjective:   Temur Schu is a 42 yr old male who presented on 9/3 to [facility] with delusions and paranoia, he was admitted to [facility] on 9/4.  PPHx is significant for Schizoaffective Disorder, Bipolar Type, Anxiety, Depression, and Polysubstance Abuse (Cocaine, Meth, EtOH, and an extensive history of aggression, and 6 Prior Psychiatric Hospitalizations (last [facility]- 10/2022), and no history of Suicide Attempts or Self Injurious Behavior. He does have an ACT Team- [facility].  Daily notes: Grigoriy is seen in his room. Chart reviewed. The chart findings discussed with the treatment team. The attending psychiatrist was present during this evaluation. Husnain presents alert, oriented x 2. He appears less manic today or at least during this evaluation. He continues to walk up & down the unit hall-way talking to himself or singing rap songs. He reports, "I feel good. I have no depression or anxiety. I slept well last night. I'm taking my medicines, I did not feel any side effects". Patient remains grandiose & delusional. 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 He adds that today he is practicing all religions. He remains sexually inappropriate, saying the male provider is thinking about having sex with him. Although appears to be less manic during this follow-up evaluation today, the staff reports that patient can be & has been disruptive on the unit, calling the staff the N-word. He remains on 1:1 supervision as he has the tendency to escalate in his aggressive behaviors. Overall, Marieo is starting to show slight improvement in his symptoms, however, has not reached his baseline. We will continue the current plan of care as already in progress. Will obtain Depakote level tomorrow morning. His vital signs remain stable.  Principal Problem: Schizoaffective disorder, bipolar type (HCC) Diagnosis: Principal Problem:    Schizoaffective disorder, bipolar type (HCC) Active Problems:   Substance-induced psychotic disorder (HCC)  Past Psychiatric History: Schizoaffective Disorder, Bipolar Type, Anxiety, Depression, and Polysubstance Abuse (Cocaine, Meth, EtOH, and an extensive history of aggression, and 6 Prior Psychiatric Hospitalizations (last [facility]- 10/2022), and no history of Suicide Attempts or Self Injurious Behavior.  He does have an ACT Team- [facility].  Past Medical History:  Past Medical History:  Diagnosis Date   Anxiety    Depression    Mental disorder    Schizophrenia (HCC)    History reviewed. No pertinent surgical history. Family History:  Family History  Problem Relation Age of Onset   Mental illness Neg Hx    Family Psychiatric  History: Reports None 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, Pt uses meth    Social History   Socioeconomic History   Marital status: Married    Spouse name: Not on file   Number of children: 1   Years of education: Not on file   Highest education level: Not on file  Occupational History   Occupation: Disabled  Tobacco Use   Smoking status: Every Day    Current packs/day: 1.00    Types: Cigarettes   Smokeless tobacco: Former  Building services engineer status: Unknown  Substance and Sexual Activity   Alcohol use: Not Currently  Comment: haven't drank in months"   Drug use: Yes    Types: Marijuana, Methamphetamines    Comment: 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 Pt lives with mother who IVC'd him.      Pt lives with parents and sister.  He is married, but  currently separated.  Pt is on disability.   Social Determinants of Health   Financial Resource Strain: Not on file  Food Insecurity: Patient Unable To Answer (03/09/2023)   Hunger Vital Sign    Worried About Running Out of Food in the Last Year: Patient unable to answer    Ran Out of Food in the Last Year: Patient unable to answer  Transportation Needs: Patient Unable To Answer (03/09/2023)   PRAPARE - Administrator, Civil Service (Medical): Patient unable to answer    Lack of Transportation (Non-Medical): Patient unable to answer  Physical Activity: Not on file  Stress: Not on file  Social Connections: Not on file   Additional Social History:   Sleep: Good  Appetite:  Good  Current Medications: Current Facility-Administered Medications  Medication Dose Route Frequency Provider Last Rate Last Admin   acetaminophen (TYLENOL) tablet 650 mg  650 mg Oral Q6H PRN Ardis Hughs, NP       alum & mag hydroxide-simeth (MAALOX/MYLANTA) 200-200-20 MG/5ML suspension 30 mL  30 mL Oral Q4H PRN Ardis Hughs, NP       diphenhydrAMINE (BENADRYL) capsule 50 mg  50 mg Oral TID PRN Armandina Stammer I, NP   50 mg at 03/18/23 1852   OLANZapine (ZYPREXA) injection 5 mg  5 mg Intramuscular TID PRN Bobbitt, Shalon E, NP   5 mg at 03/16/23 1052   And   diphenhydrAMINE (BENADRYL) injection 50 mg  50 mg Intramuscular TID PRN Bobbitt, Shalon E, NP   50 mg at 03/16/23 1052   divalproex (DEPAKOTE) DR tablet 750 mg  750 mg Oral BID Lauro Franklin, MD   750 mg at 03/18/23 2022   gabapentin (NEURONTIN) capsule 300 mg  300 mg Oral TID Armandina Stammer I, NP   300 mg at 03/18/23 2023   hydrOXYzine (ATARAX) tablet 50 mg  50 mg Oral Q4H PRN Armandina Stammer I, NP   50 mg at 03/17/23 2001   OLANZapine (ZYPREXA) tablet 5 mg  5 mg Oral TID PRN Armandina Stammer I, NP   5 mg at 03/17/23 1141   And   LORazepam (ATIVAN) tablet 1 mg  1 mg Oral TID PRN Armandina Stammer I, NP   1 mg at 03/17/23 1141   magnesium  hydroxide (MILK OF MAGNESIA) suspension 30 mL  30 mL Oral Daily PRN Ardis Hughs, NP       OLANZapine (ZYPREXA) tablet 15 mg  15 mg Oral QHS Kawthar Ennen, Nicole Kindred I, NP   15 mg at 03/18/23 2024   OLANZapine (ZYPREXA) tablet 5 mg  5 mg Oral Daily Armandina Stammer I, NP   5 mg at 03/18/23 0981   paliperidone (INVEGA) 24 hr tablet 12 mg  12 mg Oral QHS Armandina Stammer I, NP   12 mg at 03/18/23 2023   traZODone (DESYREL) tablet 300 mg  300 mg Oral QHS Armandina Stammer I, NP   300 mg at 03/18/23 2023   Lab Results: No results found  for this or any previous visit (from the past 48 hour(s)).  Blood Alcohol level:  Lab Results  Component Value Date   ETH <10 03/09/2023   ETH <10 10/05/2022   Metabolic Disorder Labs: Lab Results  Component Value Date   HGBA1C 5.7 (H) 10/07/2022   MPG 117 10/07/2022   MPG 114 10/05/2022   Lab Results  Component Value Date   PROLACTIN 17.4 10/05/2022   PROLACTIN 45.8 (H) 12/19/2017   Lab Results  Component Value Date   CHOL 156 10/05/2022   TRIG 116 10/05/2022   HDL 40 (L) 10/05/2022   CHOLHDL 3.9 10/05/2022   VLDL 23 10/05/2022   LDLCALC 93 10/05/2022   LDLCALC 69 11/21/2020   Physical Findings: AIMS:  , ,  ,  ,    CIWA:    COWS:     Musculoskeletal: Strength & Muscle Tone: within normal limits Gait & Station: normal Patient leans: N/A  Psychiatric Specialty Exam:  Presentation  General Appearance:  Casual  Eye Contact: Good (increased blinking)  Speech: Clear and Coherent; Normal Rate  Speech Volume: Normal  Handedness: Right   Mood and Affect  Mood: -- ("good")  Affect: Restricted; Labile; Inappropriate   Thought Process  Thought Processes: Disorganized  Descriptions of Associations:Tangential  Orientation:Full (Time, Place and Person)  Thought Content:Delusions; Illogical; Scattered  History of Schizophrenia/Schizoaffective disorder:Yes  Duration of Psychotic Symptoms:Greater than six months  Hallucinations:No data  recorded  Ideas of Reference:Delusions; Paranoia; Percusatory  Suicidal Thoughts:No data recorded  Homicidal Thoughts:No data recorded   Sensorium  Memory: Immediate Fair  Judgment: Impaired  Insight: Shallow   Executive Functions  Concentration: Fair  Attention Span: Fair  Recall: Poor  Fund of Knowledge: Poor  Language: Fair  Psychomotor Activity  Psychomotor Activity:No data recorded  Assets  Assets: Desire for Improvement; Social Support; Resilience; Financial Resources/Insurance  Sleep  Sleep:No data recorded  Physical Exam: Physical Exam Vitals and nursing note reviewed.  Constitutional:      General: He is not in acute distress.    Appearance: Normal appearance. He is normal weight. He is not ill-appearing or toxic-appearing.  HENT:     Head: Normocephalic and atraumatic.     Mouth/Throat:     Pharynx: Oropharynx is clear.  Cardiovascular:     Rate and Rhythm: Normal rate.     Pulses: Normal pulses.  Pulmonary:     Effort: Pulmonary effort is normal.  Genitourinary:    Comments: Deferred Musculoskeletal:        General: Normal range of motion.  Neurological:     General: No focal deficit present.     Mental Status: He is alert and oriented to person, place, and time.    Review of Systems  Constitutional:  Negative for chills, diaphoresis and fever.  HENT:  Negative for congestion and sore throat.   Respiratory:  Negative for cough and shortness of breath.   Cardiovascular:  Negative for chest pain.  Gastrointestinal:  Negative for abdominal pain, constipation, diarrhea, nausea and vomiting.  Neurological:  Negative for dizziness, weakness and headaches.  Psychiatric/Behavioral:  Negative for depression, hallucinations and suicidal ideas. The patient is not nervous/anxious.    Blood pressure 117/83, pulse (!) 101, temperature (!) 97.5 F (36.4 C), temperature source Oral, resp. rate 16, height 5\' 3"  (1.6 m), weight 70 kg, SpO2 95%.  Body mass index is 27.35 kg/m.  Treatment Plan Summary: Daily contact with patient to assess and evaluate symptoms and progress in treatment and Medication management.  Continue inpatient hospitalization.  Will continue today 03/19/2023 plan as below except where it is noted.   Schizoaffective Disorder, Bipolar Type: -Continue Invega 12 mg QHS for psychosis and mood stability -Continue Zyprexa 5 mg AM and 15 mg QHS for psychosis and mood stability -Continue Depakote DR 750 mg BID for mood stability. -Continue Agitation Protocol: Zyprexa/Ativan, Zyprexa.Benadryl, and Benadryl -Place on 1:1 -Referral to [facility]  -Continue Gabapentin 300 mg TID  -Continue Trazodone 300 mg QHS for sleep -Continue PRN's: Tylenol, Maalox, Atarax, Milk of Magnesia.    Safety and Monitoring: Voluntary admission to inpatient psychiatric unit for safety, stabilization and treatment Daily contact with patient to assess and evaluate symptoms and progress in treatment Patient's case to be discussed in multi-disciplinary team meeting Observation Level : q15 minute checks Vital signs: q12 hours Precautions: Safety   Discharge Planning: Social work and case management to assist with discharge planning and identification of hospital follow-up needs prior to discharge Estimated LOS: 5-7 days Discharge Concerns: Need to establish a safety plan; Medication compliance and effectiveness Discharge Goals: Return home with outpatient referrals for mental health follow-up including medication management/psychotherapy  Armandina Stammer, NP, pmhnp, fnp-bc. 03/19/2023, 8:55 AM Patient ID: [name], male   DOB: 06-08-1981, 42 y.o.   MRN: [id number] Patient ID: Mikhai Hanser, male   DOB: 07/26/1980, 42 y.o.   MRN: [id number] Patient ID: Buell Zerbe, male   DOB: Aug 27, 1980, 42 y.o.   MRN: [id number]

## 2023-03-19 NOTE — Group Note (Signed)
Recreation Therapy Group Note   Group Topic:Communication  Group Date: 03/19/2023 Start Time: 1015 End Time: 1040 Facilitators: Shatarra Wehling-McCall, LRT,CTRS Location: 500 Hall Dayroom   Group Topic: Communication, Problem Solving   Goal Area(s) Addresses:  Patient will effectively listen to complete activity.  Patient will identify communication skills used to make activity successful.  Patient will identify how skills used during activity can be used to reach post d/c goals.    Intervention: Building surveyor Activity - Geometric pattern cards, pencils, blank paper    Group Description: Geometric Drawings.  Three volunteers from the peer group will be shown an abstract picture with a particular arrangement of geometrical shapes.  Each round, one 'speaker' will describe the pattern, as accurately as possible without revealing the image to the group.  The remaining group members will listen and draw the picture to reflect how it is described to them. Patients with the role of 'listener' cannot ask clarifying questions but, may request that the speaker repeat a direction. Once the drawings are complete, the presenter will show the rest of the group the picture and compare how close each person came to drawing the picture. LRT will facilitate a post-activity discussion regarding effective communication and the importance of planning, listening, and asking for clarification in daily interactions with others.  Education: Environmental consultant, Active listening, Support systems, Discharge planning  Education Outcome: Acknowledges understanding/In group clarification offered/Needs additional education.     Affect/Mood: N/A   Participation Level: Did not attend    Clinical Observations/Individualized Feedback:     Plan: Continue to engage patient in RT group sessions 2-3x/week.   [name] [name], LRT,CTRS  03/19/2023 12:34 PM

## 2023-03-19 NOTE — Progress Notes (Signed)
Pt awake, visibly pacing in hall "I'm exercising right now". Ate dinner in milieu. Took his evening medications without issues. Continues to need multiple verbal redirections at intervals for intrusive behavior (going into peers' rooms, yelling & cursing at others) towards peers and staff. 1:1 maintained for sexual inappropriateness without incident thus far.

## 2023-03-19 NOTE — Progress Notes (Signed)
   03/19/23 0547  15 Minute Checks  Location Bedroom  Visual Appearance Calm  Behavior Sleeping  Sleep (Behavioral Health Patients Only)  Calculate sleep? (Click Yes once per 24 hr at 0600 safety check) Yes  Documented sleep last 24 hours 9

## 2023-03-19 NOTE — [facility] Suicide Risk Assessment (Signed)
[facility] INPATIENT:  Family/Significant Other Suicide Prevention Education  Suicide Prevention Education:  Patient presented as disorganized after several attempts made  for Family/Significant Other Suicide Prevention Education: The patient [name] [name] has been unclear to provide written consent for family/significant other to be provided Family/Significant Other Suicide Prevention Education during admission and/or prior to discharge.  Physician notified.  Nicolo Tomko S Jasten Guyette 03/19/2023, 2:31 PM

## 2023-03-19 NOTE — BH IP Treatment Plan (Signed)
Interdisciplinary Treatment and Diagnostic Plan Update  03/19/2023 Time of Session: 10:20 AM ( UPDATE)  [name] [name] MRN: [id number]  Principal Diagnosis: Schizoaffective disorder, bipolar type (HCC)  Secondary Diagnoses: Principal Problem:   Schizoaffective disorder, bipolar type (HCC) Active Problems:   Substance-induced psychotic disorder (HCC)   Current Medications:  Current Facility-Administered Medications  Medication Dose Route Frequency Provider Last Rate Last Admin   acetaminophen (TYLENOL) tablet 650 mg  650 mg Oral Q6H PRN Ardis Hughs, NP       alum & mag hydroxide-simeth (MAALOX/MYLANTA) 200-200-20 MG/5ML suspension 30 mL  30 mL Oral Q4H PRN Ardis Hughs, NP       diphenhydrAMINE (BENADRYL) capsule 50 mg  50 mg Oral TID PRN Armandina Stammer I, NP   50 mg at 03/18/23 1852   OLANZapine (ZYPREXA) injection 5 mg  5 mg Intramuscular TID PRN Bobbitt, Shalon E, NP   5 mg at 03/16/23 1052   And   diphenhydrAMINE (BENADRYL) injection 50 mg  50 mg Intramuscular TID PRN Bobbitt, Shalon E, NP   50 mg at 03/16/23 1052   divalproex (DEPAKOTE) DR tablet 750 mg  750 mg Oral BID Lauro Franklin, MD   750 mg at 03/19/23 0856   gabapentin (NEURONTIN) capsule 300 mg  300 mg Oral TID Armandina Stammer I, NP   300 mg at 03/19/23 0856   hydrOXYzine (ATARAX) tablet 50 mg  50 mg Oral Q4H PRN Armandina Stammer I, NP   50 mg at 03/17/23 2001   OLANZapine (ZYPREXA) tablet 5 mg  5 mg Oral TID PRN Armandina Stammer I, NP   5 mg at 03/17/23 1141   And   LORazepam (ATIVAN) tablet 1 mg  1 mg Oral TID PRN Armandina Stammer I, NP   1 mg at 03/17/23 1141   magnesium hydroxide (MILK OF MAGNESIA) suspension 30 mL  30 mL Oral Daily PRN Ardis Hughs, NP       OLANZapine (ZYPREXA) tablet 15 mg  15 mg Oral QHS Nwoko, Nicole Kindred I, NP   15 mg at 03/18/23 2024   OLANZapine (ZYPREXA) tablet 5 mg  5 mg Oral Daily Armandina Stammer I, NP   5 mg at 03/19/23 0856   paliperidone (INVEGA) 24 hr tablet 12 mg  12 mg Oral QHS Armandina Stammer I, NP   12 mg at 03/18/23 2023   traZODone (DESYREL) tablet 300 mg  300 mg Oral QHS Armandina Stammer I, NP   300 mg at 03/18/23 2023   PTA Medications: Medications Prior to Admission  Medication Sig Dispense Refill Last Dose   INVEGA TRINZA 546 MG/1.75ML injection Inject 546 mg into the muscle every 3 (three) months. (Patient not taking: Reported on 03/09/2023)      lithium 300 MG tablet Take 300 mg by mouth daily. (Patient not taking: Reported on 03/09/2023)      OLANZapine (ZYPREXA) 20 MG tablet Take 1 tablet (20 mg total) by mouth at bedtime. For mood control (Patient not taking: Reported on 03/09/2023) 30 tablet 0    OLANZapine (ZYPREXA) 5 MG tablet Take 1 tablet (5 mg total) by mouth 2 (two) times daily.       Patient Stressors: Other: don't want to be here.    Patient Strengths: Supportive family/friends   Treatment Modalities: Medication Management, Group therapy, Case management,  1 to 1 session with clinician, Psychoeducation, Recreational therapy.   Physician Treatment Plan for Primary Diagnosis: Schizoaffective disorder, bipolar type (HCC) Long Term Goal(s): Improvement in symptoms  so as ready for discharge   Short Term Goals: Ability to identify and develop effective coping behaviors will improve Ability to maintain clinical measurements within normal limits will improve Compliance with prescribed medications will improve Ability to identify triggers associated with substance abuse/mental health issues will improve Ability to identify changes in lifestyle to reduce recurrence of condition will improve Ability to verbalize feelings will improve Ability to disclose and discuss suicidal ideas Ability to demonstrate self-control will improve  Medication Management: Evaluate patient's response, side effects, and tolerance of medication regimen.  Therapeutic Interventions: 1 to 1 sessions, Unit Group sessions and Medication administration.  Evaluation of Outcomes: Not  Progressing  Physician Treatment Plan for Secondary Diagnosis: Principal Problem:   Schizoaffective disorder, bipolar type (HCC) Active Problems:   Substance-induced psychotic disorder (HCC)  Long Term Goal(s): Improvement in symptoms so as ready for discharge   Short Term Goals: Ability to identify and develop effective coping behaviors will improve Ability to maintain clinical measurements within normal limits will improve Compliance with prescribed medications will improve Ability to identify triggers associated with substance abuse/mental health issues will improve Ability to identify changes in lifestyle to reduce recurrence of condition will improve Ability to verbalize feelings will improve Ability to disclose and discuss suicidal ideas Ability to demonstrate self-control will improve     Medication Management: Evaluate patient's response, side effects, and tolerance of medication regimen.  Therapeutic Interventions: 1 to 1 sessions, Unit Group sessions and Medication administration.  Evaluation of Outcomes: Not Progressing   RN Treatment Plan for Primary Diagnosis: Schizoaffective disorder, bipolar type (HCC) Long Term Goal(s): Knowledge of disease and therapeutic regimen to maintain health will improve  Short Term Goals: Ability to remain free from injury will improve, Ability to verbalize frustration and anger appropriately will improve, Ability to participate in decision making will improve, Ability to verbalize feelings will improve, Ability to identify and develop effective coping behaviors will improve, and Compliance with prescribed medications will improve  Medication Management: RN will administer medications as ordered by provider, will assess and evaluate patient's response and provide education to patient for prescribed medication. RN will report any adverse and/or side effects to prescribing provider.  Therapeutic Interventions: 1 on 1 counseling sessions,  Psychoeducation, Medication administration, Evaluate responses to treatment, Monitor vital signs and CBGs as ordered, Perform/monitor CIWA, COWS, AIMS and Fall Risk screenings as ordered, Perform wound care treatments as ordered.  Evaluation of Outcomes: Not Progressing   LCSW Treatment Plan for Primary Diagnosis: Schizoaffective disorder, bipolar type (HCC) Long Term Goal(s): Safe transition to appropriate next level of care at discharge, Engage patient in therapeutic group addressing interpersonal concerns.  Short Term Goals: Engage patient in aftercare planning with referrals and resources, Increase social support, Increase emotional regulation, Facilitate acceptance of mental health diagnosis and concerns, Identify triggers associated with mental health/substance abuse issues, and Increase skills for wellness and recovery  Therapeutic Interventions: Assess for all discharge needs, 1 to 1 time with Social worker, Explore available resources and support systems, Assess for adequacy in community support network, Educate family and significant other(s) on suicide prevention, Complete Psychosocial Assessment, Interpersonal group therapy.  Evaluation of Outcomes: Not Progressing   Progress in Treatment: Attending groups: Yes. Participating in groups: Yes. Taking medication as prescribed: Yes. Toleration medication: Yes. Family/Significant other contact made: No, will contact:  Pt declined Consent Patient understands diagnosis: No. Discussing patient identified problems/goals with staff: Yes. Medical problems stabilized or resolved: Yes. Denies suicidal/homicidal ideation: No. Issues/concerns per patient self-inventory: PT  is still being inappropriate on unit      New problem(s) identified: No, Describe:  None Reported   New Short Term/Long Term Goal(s): medication stabilization, elimination of SI thoughts, development of comprehensive mental wellness plan.      Patient Goals:  "I need  to get out of this place"   Discharge Plan or Barriers: :  Patient recently admitted. CSW will continue to follow and assess for appropriate referrals and possible discharge planning.    Reason for Continuation of Hospitalization: Aggression Delusions  Hallucinations Homicidal ideation Medication stabilization Withdrawal symptoms   Estimated Length of Stay: 5-7 Days   Last 3 [place] Suicide Severity Risk Score: Flowsheet Row [facility]Current[facility] Most recent reading at 03/09/2023  2:00 PM ED from 03/09/2023 in [facility] Most recent reading at 03/09/2023  9:44 AM ED from 01/13/2023 in [facility] Most recent reading at 01/13/2023  2:16 AM  C-SSRS RISK CATEGORY No Risk No Risk No Risk       Last PHQ 2/9 Scores:     No data to display          Scribe for Treatment Team: [name] 03/19/2023 11:36 AM

## 2023-03-19 NOTE — [facility] Group Notes (Signed)
Spirituality group facilitated by Kathleen Argue, B[facility].  Group Description: Group focused on topic of hope. Patients participated in facilitated discussion around topic, connecting with one another around experiences and definitions for hope. Group members engaged with visual explorer photos, reflecting on what hope looks like for them today. Group engaged in discussion around how their definitions of hope are present today in hospital.  Modalities: Psycho-social ed, Adlerian, Narrative, MI  Patient Progress: [name] [name] came into group towards the end.  He participated in group conversation, but it was somewhat difficult to follow his train of thought.

## 2023-03-19 NOTE — Plan of Care (Signed)
?  Problem: Education: ?Goal: Knowledge of [facility] Education information/materials will improve ?Outcome: Not Progressing ?Goal: Emotional status will improve ?Outcome: Not Progressing ?Goal: Mental status will improve ?Outcome: Not Progressing ?Goal: Verbalization of understanding the information provided will improve ?Outcome: Not Progressing ?  ?Problem: Activity: ?Goal: Interest or engagement in activities will improve ?Outcome: Not Progressing ?Goal: Sleeping patterns will improve ?Outcome: Not Progressing ?  ?Problem: Coping: ?Goal: Ability to verbalize frustrations and anger appropriately will improve ?Outcome: Not Progressing ?Goal: Ability to demonstrate self-control will improve ?Outcome: Not Progressing ?  ?Problem: Health Behavior/Discharge Planning: ?Goal: Identification of resources available to assist in meeting health care needs will improve ?Outcome: Not Progressing ?Goal: Compliance with treatment plan for underlying cause of condition will improve ?Outcome: Not Progressing ?  ?Problem: Physical Regulation: ?Goal: Ability to maintain clinical measurements within normal limits will improve ?Outcome: Not Progressing ?  ?Problem: Safety: ?Goal: Periods of time without injury will increase ?Outcome: Not Progressing ?  ?

## 2023-03-19 NOTE — Progress Notes (Signed)
CSW attempted to retrieve consent to contact collaterals. Pt appeared to be disorganized and did not provide a response. CSW will continue to monitor.

## 2023-03-19 NOTE — Progress Notes (Signed)
  Patient is pacing in the hallway rambling. Patient tells other nurse, "they killed all the white folks and f*cked up all the Asians."

## 2023-03-19 NOTE — Progress Notes (Signed)
1:1 Note: Patient maintained on constant supervision for safety.  Medications given as prescribed.  Patient pacing the hallway talking to himself.  Disruptive in group and milieu.  Patient had multiple change of clothes throughout the day.

## 2023-03-19 NOTE — Progress Notes (Signed)
   1:1 Nursing Note 03/19/23 0200   Patient is asleep in bed. Patient 's breathing is easy and unlabored.  The patient is free from distress.  Patient remains on 1:1 supervision with a sitter at the bedside.The patient remains safe on the unit with q15 minute safety checks.

## 2023-03-20 DIAGNOSIS — F250 Schizoaffective disorder, bipolar type: Secondary | ICD-10-CM | POA: Diagnosis not present

## 2023-03-20 LAB — VALPROIC ACID LEVEL: Valproic Acid Lvl: 78 ug/mL (ref 50.0–100.0)

## 2023-03-20 NOTE — Group Note (Signed)
Date:  03/20/2023 Time:  8:36 PM  Group Topic/Focus:  Wrap-Up Group:   The focus of this group is to help patients review their daily goal of treatment and discuss progress on daily workbooks.    Participation Level:  Active  Participation Quality:  Appropriate  Affect:  Appropriate  Cognitive:  Appropriate  Insight: Improving  Engagement in Group:  Engaged  Modes of Intervention:  Education and Exploration  Additional Comments:  Patient attended and participated in group tonight. He reports that lunch was the best thing. He went to the dining room to eat.   Lita Mains [facility], [place] 03/20/2023, 8:36 PM

## 2023-03-20 NOTE — Progress Notes (Signed)
[facility] MD Progress Note  03/20/2023 2:39 PM [name] [name]  MRN:  [id number] Subjective:   [name] [name] is a 42 yr old male who presented on 9/3 to [facility] with delusions and paranoia, he was admitted to [facility] on 9/4.  PPHx is significant for Schizoaffective Disorder, Bipolar Type, Anxiety, Depression, and Polysubstance Abuse (Cocaine, Meth, EtOH, and an extensive history of aggression, and 6 Prior Psychiatric Hospitalizations (last [facility]- 10/2022), and no history of Suicide Attempts or Self Injurious Behavior. He does have an ACT Team- [facility].   Case was discussed in the multidisciplinary team. MAR was reviewed and patient was compliant with medications.  He received PRN Hydroxyzine last night.    Psychiatric Team made the following recommendations yesterday: -Continue Invega 12 mg QHS for psychosis and mood stability -Continue Zyprexa 5 mg AM and 15 mg QHS for psychosis and mood stability -Continue Depakote DR 750 mg BID for mood stability.     On interview today patient reports he slept good last night.  He reports his appetite is doing good.  He reports no SI, HI, or AVH.  He reports no Paranoia or Ideas of Reference.  He reports no issues with his medications.  He reports that he plans to work for the city of [place] and then work for [employer] and [place] to build [employer].  He reports that he is doing this for Thanksgiving.  He reports that he wants to be discharged so that he can begin doing this.  He reports no other concerns at present.   Principal Problem: Schizoaffective disorder, bipolar type (HCC) Diagnosis: Principal Problem:   Schizoaffective disorder, bipolar type (HCC) Active Problems:   Substance-induced psychotic disorder (HCC)  Total Time spent with patient:  I personally spent 35 minutes on the unit in direct patient care. The direct patient care time included face-to-face time with the patient, reviewing the patient's chart, communicating with other  professionals, and coordinating care. Greater than 50% of this time was spent in counseling or coordinating care with the patient regarding goals of hospitalization, psycho-education, and discharge planning needs.   Past Psychiatric History: Schizoaffective Disorder, Bipolar Type, Anxiety, Depression, and Polysubstance Abuse (Cocaine, Meth, EtOH, and an extensive history of aggression, and 6 Prior Psychiatric Hospitalizations (last [facility]- 10/2022), and no history of Suicide Attempts or Self Injurious Behavior.  He does have an ACT Team- [facility].  Past Medical History:  Past Medical History:  Diagnosis Date   Anxiety    Depression    Mental disorder    Schizophrenia (HCC)    History reviewed. No pertinent surgical history. Family History:  Family History  Problem Relation Age of Onset   Mental illness Neg Hx    Family Psychiatric  History: Reports None 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, Pt uses meth    Social History   Socioeconomic History   Marital status: Married    Spouse name: Not on file   Number of children: 1   Years of education: Not on file   Highest education level: Not on file  Occupational History   Occupation: Disabled  Tobacco Use   Smoking status: Every Day    Current packs/day: 1.00    Types: Cigarettes   Smokeless  tobacco: Former  Building services engineer status: Unknown  Substance and Sexual Activity   Alcohol use: Not Currently    Comment: haven't drank in months"   Drug use: Yes    Types: Marijuana, Methamphetamines    Comment: Per sister, Pt uses meth   Sexual activity: Yes    Birth control/protection: None  Other Topics Concern   Not on file  Social History Narrative   Pt refusing to answer any questions during  assessment. Pt lives with mother who IVC'd him.      Pt lives with parents and sister.  He is married, but currently separated.  Pt is on disability.   Social Determinants of Health   Financial Resource Strain: Not on file  Food Insecurity: Patient Unable To Answer (03/09/2023)   Hunger Vital Sign    Worried About Running Out of Food in the Last Year: Patient unable to answer    Ran Out of Food in the Last Year: Patient unable to answer  Transportation Needs: Patient Unable To Answer (03/09/2023)   PRAPARE - Administrator, Civil Service (Medical): Patient unable to answer    Lack of Transportation (Non-Medical): Patient unable to answer  Physical Activity: Not on file  Stress: Not on file  Social Connections: Not on file   Additional Social History:                         Sleep: Good  Appetite:  Good  Current Medications: Current Facility-Administered Medications  Medication Dose Route Frequency Provider Last Rate Last Admin   acetaminophen (TYLENOL) tablet 650 mg  650 mg Oral Q6H PRN Ardis Hughs, NP       alum & mag hydroxide-simeth (MAALOX/MYLANTA) 200-200-20 MG/5ML suspension 30 mL  30 mL Oral Q4H PRN Ardis Hughs, NP       diphenhydrAMINE (BENADRYL) capsule 50 mg  50 mg Oral TID PRN Armandina Stammer I, NP   50 mg at 03/18/23 1852   OLANZapine (ZYPREXA) injection 5 mg  5 mg Intramuscular TID PRN Bobbitt, Shalon E, NP   5 mg at 03/16/23 1052   And   diphenhydrAMINE (BENADRYL) injection 50 mg  50 mg Intramuscular TID PRN Bobbitt, Shalon E, NP   50 mg at 03/16/23 1052   divalproex (DEPAKOTE) DR tablet 750 mg  750 mg Oral BID Lauro Franklin, MD   750 mg at 03/20/23 0809   gabapentin (NEURONTIN) capsule 300 mg  300 mg Oral TID Armandina Stammer I, NP   300 mg at 03/20/23 1358   hydrOXYzine (ATARAX) tablet 50 mg  50 mg Oral Q4H PRN Armandina Stammer I, NP   50 mg at 03/19/23 2010   OLANZapine (ZYPREXA) tablet 5 mg  5 mg Oral TID PRN Armandina Stammer I, NP   5  mg at 03/17/23 1141   And   LORazepam (ATIVAN) tablet 1 mg  1 mg Oral TID PRN Armandina Stammer I, NP   1 mg at 03/17/23 1141   magnesium hydroxide (MILK OF MAGNESIA) suspension 30 mL  30 mL Oral Daily PRN Ardis Hughs, NP       OLANZapine (ZYPREXA) tablet 15 mg  15 mg Oral QHS Nwoko, Nicole Kindred I, NP   15 mg at 03/19/23 2010   OLANZapine (ZYPREXA) tablet 5 mg  5 mg Oral Daily Armandina Stammer I, NP   5 mg at 03/20/23 0810   paliperidone (INVEGA) 24 hr tablet 12 mg  12 mg Oral QHS Armandina Stammer I, NP   12 mg at 03/19/23 2010   traZODone (DESYREL) tablet 300 mg  300 mg Oral QHS Armandina Stammer I, NP   300 mg at 03/19/23 2011    Lab Results:  Results for orders placed or performed during the hospital encounter of 03/09/23 (from the past 48 hour(s))  Valproic acid level     Status: None   Collection Time: 03/20/23  6:25 AM  Result Value Ref Range   Valproic Acid Lvl 78 50.0 - 100.0 ug/mL    Comment: Performed at [facility], [address] [place], [place] [address]    Blood Alcohol level:  Lab Results  Component Value Date   [facility] <10 03/09/2023   ETH <10 10/05/2022    Metabolic Disorder Labs: Lab Results  Component Value Date   HGBA1C 5.7 (H) 10/07/2022   MPG 117 10/07/2022   MPG 114 10/05/2022   Lab Results  Component Value Date   PROLACTIN 17.4 10/05/2022   PROLACTIN 45.8 (H) 12/19/2017   Lab Results  Component Value Date   CHOL 156 10/05/2022   TRIG 116 10/05/2022   HDL 40 (L) 10/05/2022   CHOLHDL 3.9 10/05/2022   VLDL 23 10/05/2022   LDLCALC 93 10/05/2022   LDLCALC 69 11/21/2020    Physical Findings: AIMS:  , ,  ,  ,    CIWA:    COWS:     Musculoskeletal: Strength & Muscle Tone: within normal limits Gait & Station: normal Patient leans: N/A  Psychiatric Specialty Exam:  Presentation  General Appearance:  Casual  Eye Contact: Fair  Speech: Clear and Coherent; Normal Rate  Speech Volume: Normal  Handedness: Right   Mood and Affect   Mood: -- ("good")  Affect: Restricted   Thought Process  Thought Processes: Disorganized  Descriptions of Associations:Loose  Orientation:Full (Time, Place and Person)  Thought Content:Delusions; Scattered  History of Schizophrenia/Schizoaffective disorder:Yes  Duration of Psychotic Symptoms:Greater than six months  Hallucinations:Hallucinations: None   Ideas of Reference:Delusions  Suicidal Thoughts:Suicidal Thoughts: No   Homicidal Thoughts:Homicidal Thoughts: No    Sensorium  Memory: Immediate Fair  Judgment: Impaired  Insight: Shallow   Executive Functions  Concentration: Fair  Attention Span: Fair  Recall: Poor  Fund of Knowledge: Poor  Language: Fair   Psychomotor Activity  Psychomotor Activity:Psychomotor Activity: Normal    Assets  Assets: Social Support; Resilience; Financial Resources/Insurance   Sleep  Sleep:Sleep: Good Number of Hours of Sleep: 9.25     Physical Exam: Physical Exam Vitals and nursing note reviewed.  Constitutional:      General: He is not in acute distress.    Appearance: Normal appearance. He is normal weight. He is not ill-appearing or toxic-appearing.  HENT:     Head: Normocephalic and atraumatic.  Pulmonary:     Effort: Pulmonary effort is normal.  Musculoskeletal:        General: Normal range of motion.  Neurological:     General: No focal deficit present.     Mental Status: He is alert.    Review of Systems  Respiratory:  Negative for cough and shortness of breath.   Cardiovascular:  Negative for chest pain.  Gastrointestinal:  Negative for abdominal pain, constipation, diarrhea, nausea and vomiting.  Neurological:  Negative for dizziness, weakness and headaches.  Psychiatric/Behavioral:  Negative for depression, hallucinations and suicidal ideas. The patient is not nervous/anxious.    Blood pressure 129/63, pulse 86, temperature (!) 97.5 F (36.4  C), temperature source Oral,  resp. rate 16, height 5\' 3"  (1.6 m), weight 70 kg, SpO2 98%. Body mass index is 27.35 kg/m.   Treatment Plan Summary: Daily contact with patient to assess and evaluate symptoms and progress in treatment and Medication management  [name] [name] is a 42 yr old male who presented on 9/3 to [facility] with delusions and paranoia, he was [facility].  PPHx is significant for Schizoaffective Disorder, Bipolar Type, Anxiety, Depression, and Polysubstance Abuse (Cocaine, Meth, EtOH, and an extensive history of aggression, and 6 Prior Psychiatric Hospitalizations (last [facility]- 10/2022), and no history of Suicide Attempts or Self Injurious Behavior. He does have an ACT Team- [facility].    Donnivan continues to be disorganized, however, he does appear to be less sexually inappropriate today and so does seem to be improving.  We will continue with the one-to-one monitoring for now.  We will not make any changes to his medication at this time.  We will continue to monitor.   Schizoaffective Disorder, Bipolar Type: -Continue Invega 12 mg QHS for psychosis and mood stability -Continue Zyprexa 5 mg AM and 15 mg QHS for psychosis and mood stability -Continue Depakote DR 750 mg BID for mood stability. -Continue Agitation Protocol: Zyprexa/Ativan, Zyprexa.Benadryl, and Benadryl -Continue 1:1 -Referral to [facility]   -Continue Gabapentin 300 mg TID  -Continue Trazodone 300 mg QHS for sleep -Continue PRN's: Tylenol, Maalox, Atarax, Milk of Magnesia   Lauro Franklin, MD 03/20/2023, 2:39 PM

## 2023-03-20 NOTE — Plan of Care (Signed)
Problem: Safety: Goal: Periods of time without injury will increase Outcome: Progressing

## 2023-03-20 NOTE — Progress Notes (Signed)
Nursing 1:1 Note   D: Pt is laying in bed awake. Respirations noted even and unlabored. Pt complaint with medications. Attended group. Denies SI, HI, and AVH. Pt remains hypersexual and delusional.  A: Water and snack offered. Emotional support and availability provided. Minimizing stimuli. Offered rest. Maintaining 1: 1 for safety.  R: Pt laying in bed rambling to self. Receptive to care interventions at this time. Remains safe on 1:1.

## 2023-03-20 NOTE — Progress Notes (Signed)
   03/20/23 0548  15 Minute Checks  Location Bedroom  Visual Appearance Guarded  Behavior Anxious  Sleep (Behavioral Health Patients Only)  Calculate sleep? (Click Yes once per 24 hr at 0600 safety check) Yes  Documented sleep last 24 hours 9.25

## 2023-03-20 NOTE — Progress Notes (Signed)
Nursing 1:1 Note   D: Pt is seated upright in dayroom speaking with sitter. Respirations noted even and unlabored.   A: Emotional support and availability provided. Maintaining 1: 1 for safety.  R: Pt remains seated quietly. Receptive to care interventions at this time. Remains safe on 1:1.

## 2023-03-20 NOTE — Progress Notes (Signed)
Patient being monitored 1:1 without incident. Patient denies SI, HI and AVH but still remains delusional. Patient stated "my wife stuck a needle in her pussy and killed our baby." Patient has been noted to pace the hall and needed to be redirected about touching peers. Continue to monitor as planned.

## 2023-03-20 NOTE — Progress Notes (Signed)
1:1 Nursing note: Pt is laying in bed . Respirations are even and unlabored. No signs of distress. 1:1 continued for pt safety. Safety maintained.

## 2023-03-20 NOTE — Progress Notes (Signed)
Patient denies SI, HI, and AVH this shift. Patient remains hyper verbal and hyper sexual. Patient requires redirection due to behaviors. Patient has been redirected about attempting to touch peers and making sexual statements.

## 2023-03-20 NOTE — Progress Notes (Signed)
Patient being monitored 1:1 without incident. Patient denies SI, HI and AVH but still remains delusional. Patient stated "my wife stuck a needle in her pussy and killed our baby." Patient has been noted to pace the hall and needed to be redirected about touching peers. Patient had to be redirected about inappropriate talk and touching peers. Continue to monitor as planned.

## 2023-03-21 DIAGNOSIS — F250 Schizoaffective disorder, bipolar type: Secondary | ICD-10-CM | POA: Diagnosis not present

## 2023-03-21 MED ORDER — HALOPERIDOL 5 MG PO TABS
5.0000 mg | ORAL_TABLET | Freq: Two times a day (BID) | ORAL | Status: DC
  Administered 2023-03-21 – 2023-03-23 (×5): 5 mg via ORAL
  Filled 2023-03-21 (×8): qty 1

## 2023-03-21 MED ORDER — BENZTROPINE MESYLATE 0.5 MG PO TABS
0.5000 mg | ORAL_TABLET | Freq: Two times a day (BID) | ORAL | Status: DC
  Administered 2023-03-21 – 2023-03-26 (×11): 0.5 mg via ORAL
  Filled 2023-03-21 (×16): qty 1

## 2023-03-21 NOTE — Progress Notes (Signed)
[name] [name] [name] Progress Note  03/21/2023 12:41 PM [name] [name]  MRN:  [id number] Subjective:   [name] [name] is a 42 yr old male who presented on 9/3 to [facility] with delusions and paranoia, he was admitted to [facility] on 9/4.  PPHx is significant for Schizoaffective Disorder, Bipolar Type, Anxiety, Depression, and Polysubstance Abuse (Cocaine, Meth, EtOH, and an extensive history of aggression, and 6 Prior Psychiatric Hospitalizations (last [facility]- 10/2022), and no history of Suicide Attempts or Self Injurious Behavior. He does have an ACT Team- [facility].   Case was discussed in the multidisciplinary team. MAR was reviewed and patient was compliant with medications.  He received agitation PRN's Zyprexa, Benadryl, and Ativan PO this morning.      Psychiatric Team made the following recommendations yesterday: -Continue Invega 12 mg QHS for psychosis and mood stability -Continue Zyprexa 5 mg AM and 15 mg QHS for psychosis and mood stability -Continue Depakote DR 750 mg BID for mood stability.     On interview today patient reports he slept good last night.  He reports his appetite is doing good.  He reports no SI, HI, or AVH.  He reports no Paranoia or Ideas of Reference.  He reports no issues with his medications.  He reports that there are police surrounding the hospital and will be raiding it soon.  He then reports that the provider is gay and he doesn't like gay people.  He reports that this is his only problem.  He then says he will have his gang behead the provider.  He then ends the interview.  He reports no other concerns at present.   Principal Problem: Schizoaffective disorder, bipolar type (HCC) Diagnosis: Principal Problem:   Schizoaffective disorder, bipolar type (HCC) Active Problems:   Substance-induced psychotic disorder (HCC)  Total Time spent with patient:  [name] personally spent 35 minutes on the unit in direct patient care. The direct patient care time included face-to-face time with the  patient, reviewing the patient's chart, communicating with other professionals, and coordinating care. Greater than 50% of this time was spent in counseling or coordinating care with the patient regarding goals of hospitalization, psycho-education, and discharge planning needs.   Past Psychiatric History: Schizoaffective Disorder, Bipolar Type, Anxiety, Depression, and Polysubstance Abuse (Cocaine, Meth, EtOH, and an extensive history of aggression, and 6 Prior Psychiatric Hospitalizations (last [facility]- 10/2022), and no history of Suicide Attempts or Self Injurious Behavior.  He does have an ACT Team- [facility].  Past Medical History:  Past Medical History:  Diagnosis Date   Anxiety    Depression    Mental disorder    Schizophrenia (HCC)    History reviewed. No pertinent surgical history. Family History:  Family History  Problem Relation Age of Onset   Mental illness Neg Hx    Family Psychiatric  History: Reports None 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, Pt uses meth    Social History   Socioeconomic History   Marital status: Married    Spouse name: Not on file   Number of children: 1   Years of education: Not on file   Highest education level: Not on file  Occupational History   Occupation: Disabled  Tobacco Use   Smoking status: Every Day  Current packs/day: 1.00    Types: Cigarettes   Smokeless tobacco: Former  Building services engineer status: Unknown  Substance and Sexual Activity   Alcohol use: Not Currently    Comment: haven't drank in months"   Drug use: Yes    Types: Marijuana, Methamphetamines    Comment: Per sister, Pt uses meth   Sexual activity: Yes    Birth control/protection: None  Other Topics Concern   Not on file  Social History  Narrative   Pt refusing to answer any questions during assessment. Pt lives with mother who IVC'd him.      Pt lives with parents and sister.  He is married, but currently separated.  Pt is on disability.   Social Determinants of Health   Financial Resource Strain: Not on file  Food Insecurity: Patient Unable To Answer (03/09/2023)   Hunger Vital Sign    Worried About Running Out of Food in the Last Year: Patient unable to answer    Ran Out of Food in the Last Year: Patient unable to answer  Transportation Needs: Patient Unable To Answer (03/09/2023)   PRAPARE - Administrator, Civil Service (Medical): Patient unable to answer    Lack of Transportation (Non-Medical): Patient unable to answer  Physical Activity: Not on file  Stress: Not on file  Social Connections: Not on file   Additional Social History:                         Sleep: Good  Appetite:  Good  Current Medications: Current Facility-Administered Medications  Medication Dose Route Frequency Provider Last Rate Last Admin   acetaminophen (TYLENOL) tablet 650 mg  650 mg Oral Q6H PRN [name] Hughs, [name] [name]       alum & mag hydroxide-simeth (MAALOX/MYLANTA) 200-200-20 MG/5ML suspension 30 mL  30 mL Oral Q4H PRN [name] Hughs, [name] [name]       benztropine (COGENTIN) tablet 0.5 mg  0.5 mg Oral BID [name] Franklin, [name] [name]   0.5 mg at 03/21/23 0820   diphenhydrAMINE (BENADRYL) capsule 50 mg  50 mg Oral TID PRN [name] Stammer [name], [name] [name]   50 mg at 03/21/23 0640   OLANZapine (ZYPREXA) injection 5 mg  5 mg Intramuscular TID PRN [name] [name], [name] [name], [name] [name]   5 mg at 03/16/23 1052   And   diphenhydrAMINE (BENADRYL) injection 50 mg  50 mg Intramuscular TID PRN [name] [name], [name] [name], [name] [name]   50 mg at 03/16/23 1052   divalproex (DEPAKOTE) DR tablet 750 mg  750 mg Oral BID [name] Franklin, [name] [name]   750 mg at 03/21/23 8119   gabapentin (NEURONTIN) capsule 300 mg  300 mg Oral TID [name] Stammer [name], [name] [name]   300 mg at 03/21/23 1478   haloperidol  (HALDOL) tablet 5 mg  5 mg Oral BID [name] Franklin, [name] [name]   5 mg at 03/21/23 2956   hydrOXYzine (ATARAX) tablet 50 mg  50 mg Oral Q4H PRN [name] Stammer [name], [name] [name]   50 mg at 03/19/23 2010   OLANZapine (ZYPREXA) tablet 5 mg  5 mg Oral TID PRN [name] Stammer [name], [name] [name]   5 mg at 03/21/23 2130   And   LORazepam (ATIVAN) tablet 1 mg  1 mg Oral TID PRN [name] Stammer [name], [name] [name]   1 mg at 03/21/23 0640   magnesium hydroxide (MILK OF MAGNESIA) suspension 30 mL  30 mL Oral Daily PRN [name] Hughs, [name] [name]  OLANZapine (ZYPREXA) tablet 15 mg  15 mg Oral QHS [name] [name], [name] [name], [name] [name]   15 mg at 03/20/23 2014   OLANZapine (ZYPREXA) tablet 5 mg  5 mg Oral Daily [name] Stammer [name], [name] [name]   5 mg at 03/21/23 5621   traZODone (DESYREL) tablet 300 mg  300 mg Oral QHS [name] Stammer [name], [name] [name]   300 mg at 03/20/23 2014    Lab Results:  Results for orders placed or performed during the hospital encounter of 03/09/23 (from the past 48 hour(s))  Valproic acid level     Status: None   Collection Time: 03/20/23  6:25 AM  Result Value Ref Range   Valproic Acid Lvl 78 50.0 - 100.0 ug/mL    Comment: Performed at [facility], [address], [place], [place] [address]    Blood Alcohol level:  Lab Results  Component Value Date   [facility] <10 03/09/2023   ETH <10 10/05/2022    Metabolic Disorder Labs: Lab Results  Component Value Date   HGBA1C 5.7 (H) 10/07/2022   MPG 117 10/07/2022   MPG 114 10/05/2022   Lab Results  Component Value Date   PROLACTIN 17.4 10/05/2022   PROLACTIN 45.8 (H) 12/19/2017   Lab Results  Component Value Date   CHOL 156 10/05/2022   TRIG 116 10/05/2022   HDL 40 (L) 10/05/2022   CHOLHDL 3.9 10/05/2022   VLDL 23 10/05/2022   LDLCALC 93 10/05/2022   LDLCALC 69 11/21/2020    Physical Findings: AIMS:  , ,  ,  ,    CIWA:    COWS:     Musculoskeletal: Strength & Muscle Tone: within normal limits Gait & Station: normal Patient leans: N/A  Psychiatric Specialty Exam:  Presentation   General Appearance:  Casual  Eye Contact: Fair  Speech: Clear and Coherent  Speech Volume: Normal  Handedness: Right   Mood and Affect  Mood: -- ("great")  Affect: Labile; Inappropriate   Thought Process  Thought Processes: Disorganized  Descriptions of Associations:Loose  Orientation:Full (Time, Place and Person)  Thought Content:Delusions; Scattered  History of Schizophrenia/Schizoaffective disorder:Yes  Duration of Psychotic Symptoms:Greater than six months  Hallucinations:Hallucinations: None   Ideas of Reference:Delusions  Suicidal Thoughts:Suicidal Thoughts: No   Homicidal Thoughts:Homicidal Thoughts: No    Sensorium  Memory: Immediate Fair  Judgment: Impaired  Insight: Shallow   Executive Functions  Concentration: Fair  Attention Span: Fair  Recall: Poor  Fund of Knowledge: Poor  Language: Fair   Psychomotor Activity  Psychomotor Activity:Psychomotor Activity: Normal    Assets  Assets: Social Support; Resilience; Financial Resources/Insurance   Sleep  Sleep:Sleep: Poor Number of Hours of Sleep: 3     Physical Exam: Physical Exam Vitals and nursing note reviewed.  Constitutional:      General: He is not in acute distress.    Appearance: Normal appearance. He is normal weight. He is not ill-appearing or toxic-appearing.  HENT:     Head: Normocephalic and atraumatic.  Pulmonary:     Effort: Pulmonary effort is normal.  Musculoskeletal:        General: Normal range of motion.  Neurological:     General: No focal deficit present.     Mental Status: He is alert.    Review of Systems  Respiratory:  Negative for cough and shortness of breath.   Cardiovascular:  Negative for chest pain.  Gastrointestinal:  Negative for abdominal pain, constipation, diarrhea, nausea and vomiting.  Neurological:  Negative for dizziness, weakness and headaches.  Psychiatric/Behavioral:  Negative for depression,  hallucinations and suicidal ideas. The patient is not nervous/anxious.    Blood pressure 129/63, pulse 86, temperature (!) 97.5 F (36.4 C), temperature source Oral, resp. rate 16, height 5\' 3"  (1.6 m), weight 70 kg, SpO2 98%. Body mass index is 27.35 kg/m.   Treatment Plan Summary: Daily contact with patient to assess and evaluate symptoms and progress in treatment and Medication management  [name] [name] is a 42 yr old male who presented on 9/3 to [facility] with delusions and paranoia, he was admitted to [facility] on 9/4.  PPHx is significant for Schizoaffective Disorder, Bipolar Type, Anxiety, Depression, and Polysubstance Abuse (Cocaine, Meth, EtOH, and an extensive history of aggression, and 6 Prior Psychiatric Hospitalizations (last [facility]- 10/2022), and no history of Suicide Attempts or Self Injurious Behavior. He does have an ACT Team- [facility].    [name] [name] continues to be labile and disorganized and delusional.  Due to none effectiveness of doses of both Zyprexa and Invega we will discontinue the Invega and begin Haldol.  We will ensure updated notes are sent to [facility] tomorrow as he continues to show minimal improvement.  We will continue to monitor.    Schizoaffective Disorder, Bipolar Type: -Stop Invega -Start Haldol 5 mg BID for psychosis and mood stability -Continue Zyprexa 5 mg AM and 15 mg QHS for psychosis and mood stability -Continue Depakote DR 750 mg BID for mood stability. -Continue Agitation Protocol: Zyprexa/Ativan, Zyprexa.Benadryl, and Benadryl -Continue 1:1 -Referral to [facility]   -Continue Gabapentin 300 mg TID  -Continue Trazodone 300 mg QHS for sleep -Continue PRN's: Tylenol, Maalox, Atarax, Milk of Magnesia   [name] Franklin, [name] [name] 03/21/2023, 12:41 PM

## 2023-03-21 NOTE — Progress Notes (Signed)
Pt walked by another patient on the unit that was white. Pt began yelling " I don't like white people." Pt proceeded to throw items in the unit. Began to pace around the unit. Pt visibly agitated. Pt then stated "I am a god, my army will come after you and will kill you. I will kill you and make you a martyr." Pt was given Agitation Protocol PO per MAR.

## 2023-03-21 NOTE — Plan of Care (Signed)
  Problem: Activity: Goal: Interest or engagement in activities will improve Outcome: Progressing   Problem: Safety: Goal: Periods of time without injury will increase Outcome: Progressing

## 2023-03-21 NOTE — Progress Notes (Signed)
Nursing 1:1 Note:  Patient seen in the milieu and walking the halls. Patient reports improved mood from this morning. 1:1 monitoring continues. Patient remains safe at this time.

## 2023-03-21 NOTE — Progress Notes (Signed)
   03/21/23 2200  Psych Admission Type (Psych Patients Only)  Admission Status Involuntary  Psychosocial Assessment  Patient Complaints None  Eye Contact Fair  Facial Expression Animated  Affect Preoccupied  Speech Tangential  Interaction Flirtatious  Motor Activity Fidgety  Appearance/Hygiene Layered clothes  Behavior Characteristics Cooperative;Hypersexual  Mood Preoccupied  Thought Process  Coherency Disorganized  Content Preoccupation  Delusions Erotomanic;Grandeur  Perception Derealization  Hallucination None reported or observed  Judgment Impaired  Confusion None  Danger to Self  Current suicidal ideation? Denies  Danger to Others  Danger to Others None reported or observed

## 2023-03-21 NOTE — Progress Notes (Signed)
   03/21/23 0600  15 Minute Checks  Location Dayroom  Visual Appearance Calm  Behavior Composed  Sleep (Behavioral Health Patients Only)  Calculate sleep? (Click Yes once per 24 hr at 0600 safety check) Yes  Documented sleep last 24 hours 3

## 2023-03-21 NOTE — Progress Notes (Signed)
Nursing 1:1 Note:  Patient asleep in his room. No distress noted. Patient remains safe at this time.

## 2023-03-21 NOTE — Progress Notes (Signed)
   03/21/23 0810  Psych Admission Type (Psych Patients Only)  Admission Status Involuntary  Psychosocial Assessment  Patient Complaints None  Eye Contact Fair  Facial Expression Animated  Affect Preoccupied  Speech Tangential  Interaction Flirtatious  Motor Activity Fidgety  Appearance/Hygiene Layered clothes  Behavior Characteristics Cooperative;Hypersexual  Mood Preoccupied  Thought Process  Coherency Disorganized  Content Preoccupation  Delusions Erotomanic;Grandeur  Perception Derealization  Hallucination None reported or observed  Judgment Impaired  Confusion None  Danger to Self  Current suicidal ideation? Denies  Danger to Others  Danger to Others None reported or observed

## 2023-03-21 NOTE — Plan of Care (Signed)
  Problem: Education: Goal: Emotional status will improve Outcome: Not Progressing Goal: Mental status will improve Outcome: Not Progressing   Problem: Coping: Goal: Ability to verbalize frustrations and anger appropriately will improve Outcome: Not Progressing   

## 2023-03-21 NOTE — Progress Notes (Signed)
Nursing 1:1 Note   D: Pt visible in bedroom. Remains delusional. Pt standing at bedroom door stating "I am a cyborg god." Respirations noted even and unlabored.   A: Emotional support and availability provided. Maintaining 1: 1 for safety.  R: Pt speaking with Clinical research associate while standing at doorway. Receptive to care interventions at this time. Remains safe on 1:1.

## 2023-03-21 NOTE — Progress Notes (Signed)
Nursing 1:1 Note   D: Pt awake pacing in bedroom. Respirations noted even and unlabored.   A: Emotional support and availability provided. Minimizing stimuli. Offered rest. Maintaining 1: 1 for safety.  R: Receptive to care interventions at this time. Remains safe on 1:1.

## 2023-03-21 NOTE — Group Note (Signed)
Date:  03/21/2023 Time:  8:28 PM  Group Topic/Focus:  Wrap-Up Group:   The focus of this group is to help patients review their daily goal of treatment and discuss progress on daily workbooks.    Participation Level:  None  Participation Quality:  Drowsy  Affect:  Flat  Cognitive:   sleeping  Insight: None  Engagement in Group:  None  Modes of Intervention:  sleeping  Additional Comments:  Patient attended, however did not participate in group. He slept through group.  Lita Mains [facility] 03/21/2023, 8:28 PM

## 2023-03-21 NOTE — Progress Notes (Signed)
Nursing 1:1 Note:  Patient is in the dayroom interacting with peers. No distress noted. 1:1 monitoring continues. Patient remains safe at this time.

## 2023-03-22 DIAGNOSIS — F250 Schizoaffective disorder, bipolar type: Secondary | ICD-10-CM | POA: Diagnosis not present

## 2023-03-22 NOTE — [facility] Group Notes (Signed)
At ended group and participated.

## 2023-03-22 NOTE — Progress Notes (Signed)
1:1 Note: Patient maintained on constant supervision for safety.  Medications  given as prescribed.  Routine safety checks maintained.  Patient observed pacing the hallway talking to himself.  Patient is safe on the unit with supervision.

## 2023-03-22 NOTE — Group Note (Unsigned)
Date:  03/22/2023 Time:  7:47 AM  Group Topic/Focus:  Goals Group:   The focus of this group is to help patients establish daily goals to achieve during treatment and discuss how the patient can incorporate goal setting into their daily lives to aide in recovery. Orientation:   The focus of this group is to educate the patient on the purpose and policies of crisis stabilization and provide a format to answer questions about their admission.  The group details unit policies and expectations of patients while admitted.     Participation Level:  {BHH PARTICIPATION MVHQI:69629}  Participation Quality:  {BHH PARTICIPATION QUALITY:22265}  Affect:  {BHH AFFECT:22266}  Cognitive:  {B[facility] COGNITIVE:22267}  Insight: {BHH Insight2:20797}  Engagement in Group:  {BHH ENGAGEMENT IN BMWUX:32440}  Modes of Intervention:  {BHH MODES OF INTERVENTION:22269}  Additional Comments:  ***  [name] [name] 03/22/2023, 7:47 AM

## 2023-03-22 NOTE — Progress Notes (Signed)
Pt remains on 1:1 observation. Respirations even and unlabored. Pt cooperative and calm at this time.

## 2023-03-22 NOTE — Progress Notes (Signed)
Nursing 1:1 Note   D: Pt in bedroom sleeping with eyes closed. Respirations noted even and unlabored. No distress noted. Pt compliant with medications. Briefly attended group.   A: Water and snack offered. Emotional support and availability provided. Minimizing stimuli. Offered rest. Maintaining 1: 1 for safety.  R: Pt laying in bed asleep. Receptive to care interventions at this time. Remains safe on 1:1.

## 2023-03-22 NOTE — Plan of Care (Signed)
Problem: Education: Goal: Verbalization of understanding the information provided will improve Outcome: Progressing   Problem: Safety: Goal: Periods of time without injury will increase Outcome: Progressing

## 2023-03-22 NOTE — Progress Notes (Cosign Needed Addendum)
[facility] MD Progress Note  03/22/2023 12:33 PM [name] [name] [name] [name] [name]  MRN:  [id number] Subjective:   [name] [name] [name] [name] [name] is a 42 yr old male who presented on 9/3 to [facility] with delusions and [name] [name] [name], [name] [name] to [facility] on 9/4.  PPHx is significant for Schizoaffective Disorder, Bipolar Type, Anxiety, Depression, and Polysubstance Abuse (Cocaine, Meth, EtOH, and an extensive history of aggression, and 6 Prior Psychiatric Hospitalizations (last [facility]- 10/2022), and no history of Suicide Attempts or Self Injurious Behavior. [name] [name] does have an ACT Team- [facility].   Case was discussed in [name] [name] multidisciplinary team. MAR was reviewed and [name] [name] [name] with medications.  [name] [name] after [name] [name] agitation doses [name] [name] received yesterday morning.   Psychiatric Team made [name] [name] following recommendations yesterday: -Stop Invega -Start Haldol 5 mg BID for psychosis and mood stability -Continue Zyprexa 5 mg AM and 15 mg QHS for psychosis and mood stability -Continue Depakote DR 750 mg BID for mood stability.    On interview today [name] [name] [name] reports [name] [name] slept good last night.  [name] [name] reports his appetite is doing good.  [name] [name] reports no SI, HI, or AVH (per staff talking and singing to himself).  [name] [name] reports no [name] [name] [name] or Ideas of Reference.  [name] [name] reports no issues with his medications.  [name] [name] started off [name] [name] interview by apologizing to [name] [name] provider about yesterday's interview.  [name] [name] reports "I know your father."  [name] [name] asked about discharge and discussed with him that [name] [name] was not quite ready and we were still considering medication changes and would need to continue monitoring him and reported understanding.  [name] [name] reports no other concerns at present.    Principal Problem: Schizoaffective disorder, bipolar type (HCC) Diagnosis: Principal Problem:   Schizoaffective disorder, bipolar type (HCC) Active Problems:   Substance-induced psychotic disorder (HCC)  Total Time spent with [name] [name] [name]  I personally spent 35 minutes on [name] [name] unit  in direct [name] [name] [name] care. [name] [name] [name] [name] [name], [name] [name] [name] [name] [name], [name] [name] [name] [name] [name], and coordinating care. Greater than 50% of this time was spent in counseling or coordinating care with [name] [name] [name] [name] [name] regarding goals of hospitalization, psycho-education, and discharge planning needs.   Past Psychiatric History: Schizoaffective Disorder, Bipolar Type, Anxiety, Depression, and Polysubstance Abuse (Cocaine, Meth, EtOH, and an extensive history of aggression, and 6 Prior Psychiatric Hospitalizations (last [facility]- 10/2022), and no history of Suicide Attempts or Self Injurious Behavior.  [name] [name] does have an ACT Team- [facility].  Past Medical History:  Past Medical History:  Diagnosis Date   Anxiety    Depression    Mental disorder    Schizophrenia (HCC)    History reviewed. No pertinent surgical history. Family History:  Family History  Problem Relation Age of Onset   Mental illness Neg Hx    Family Psychiatric  History: Reports None 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, Pt uses meth    Social History   Socioeconomic History   Marital status: Married    Spouse name: Not on file   Number of children: 1   Years of education: Not on file   Highest education level: Not on file  Occupational History   Occupation: Disabled  Tobacco Use   Smoking status:  Every Day    Current packs/day: 1.00    Types: Cigarettes   Smokeless tobacco: Former  Building services engineer status: Unknown  Substance and Sexual Activity   Alcohol use: Not Currently    Comment: haven't drank in months"   Drug use: Yes    Types: Marijuana, Methamphetamines    Comment: Per sister, Pt uses meth   Sexual activity: Yes     Birth control/protection: None  Other Topics Concern   Not on file  Social History Narrative   Pt refusing to answer any questions during assessment. Pt lives with mother who IVC'd him.      Pt lives with parents and sister.  [name] [name] is married, but currently separated.  Pt is on disability.   Social Determinants of Health   Financial Resource Strain: Not on file  Food Insecurity: [name] [name] [name] (03/09/2023)   Hunger Vital Sign    Worried About Running Out of Food in [name] [name] Last Year: [name] [name] [name]    Ran Out of Food in [name] [name] Last Year: [name] [name] [name]  Transportation Needs: [name] [name] [name] (03/09/2023)   PRAPARE - Administrator, Civil Service (Medical): [name] [name] [name]    Lack of Transportation (Non-Medical): [name] [name] [name]  Physical Activity: Not on file  Stress: Not on file  Social Connections: Not on file   Additional Social History:                         Sleep: Good  Appetite:  Good  Current Medications: Current Facility-Administered Medications  Medication Dose Route Frequency Provider Last Rate Last Admin   acetaminophen (TYLENOL) tablet 650 mg  650 mg Oral Q6H PRN Ardis Hughs, NP       alum & mag hydroxide-simeth (MAALOX/MYLANTA) 200-200-20 MG/5ML suspension 30 mL  30 mL Oral Q4H PRN Ardis Hughs, NP       benztropine (COGENTIN) tablet 0.5 mg  0.5 mg Oral BID Lauro Franklin, MD   0.5 mg at 03/22/23 1610   diphenhydrAMINE (BENADRYL) capsule 50 mg  50 mg Oral TID PRN Armandina Stammer I, NP   50 mg at 03/21/23 0640   OLANZapine (ZYPREXA) injection 5 mg  5 mg Intramuscular TID PRN Bobbitt, Shalon E, NP   5 mg at 03/16/23 1052   And   diphenhydrAMINE (BENADRYL) injection 50 mg  50 mg Intramuscular TID PRN Bobbitt, Shalon E, NP   50 mg at 03/16/23 1052   divalproex (DEPAKOTE) DR tablet 750 mg  750 mg Oral BID Lauro Franklin, MD   750 mg at 03/22/23 0809   gabapentin (NEURONTIN)  capsule 300 mg  300 mg Oral TID Armandina Stammer I, NP   300 mg at 03/22/23 9604   haloperidol (HALDOL) tablet 5 mg  5 mg Oral BID Lauro Franklin, MD   5 mg at 03/22/23 0809   hydrOXYzine (ATARAX) tablet 50 mg  50 mg Oral Q4H PRN Armandina Stammer I, NP   50 mg at 03/19/23 2010   OLANZapine (ZYPREXA) tablet 5 mg  5 mg Oral TID PRN Armandina Stammer I, NP   5 mg at 03/21/23 0641   And   LORazepam (ATIVAN) tablet 1 mg  1 mg Oral TID PRN Armandina Stammer I, NP   1 mg at 03/21/23 0640   magnesium hydroxide (MILK OF MAGNESIA) suspension 30 mL  30 mL Oral Daily PRN Effie Shy,  Liane Comber, NP       OLANZapine (ZYPREXA) tablet 15 mg  15 mg Oral QHS Armandina Stammer I, NP   15 mg at 03/21/23 2045   OLANZapine (ZYPREXA) tablet 5 mg  5 mg Oral Daily Armandina Stammer I, NP   5 mg at 03/22/23 5409   traZODone (DESYREL) tablet 300 mg  300 mg Oral QHS Armandina Stammer I, NP   300 mg at 03/21/23 2045    Lab Results:  No results found for this or any previous visit (from [name] [name] past 48 hour(s)).   Blood Alcohol level:  Lab Results  Component Value Date   ETH <10 03/09/2023   ETH <10 10/05/2022    Metabolic Disorder Labs: Lab Results  Component Value Date   HGBA1C 5.7 (H) 10/07/2022   MPG 117 10/07/2022   MPG 114 10/05/2022   Lab Results  Component Value Date   PROLACTIN 17.4 10/05/2022   PROLACTIN 45.8 (H) 12/19/2017   Lab Results  Component Value Date   CHOL 156 10/05/2022   TRIG 116 10/05/2022   HDL 40 (L) 10/05/2022   CHOLHDL 3.9 10/05/2022   VLDL 23 10/05/2022   LDLCALC 93 10/05/2022   LDLCALC 69 11/21/2020    Physical Findings: AIMS:  , ,  ,  ,    CIWA:    COWS:     Musculoskeletal: Strength & Muscle Tone: within normal limits Gait & Station: normal [name] [name] [name] leans: N/A  Psychiatric Specialty Exam:  Presentation  General Appearance:  Casual  Eye Contact: Fair  Speech: Clear and Coherent  Speech Volume: Normal  Handedness: Right   Mood and Affect  Mood: --  ("fine")  Affect: Labile   Thought Process  Thought Processes: Disorganized  Descriptions of Associations:Loose  Orientation:Full (Time, Place and Person)  Thought Content:Delusions; Scattered  History of Schizophrenia/Schizoaffective disorder:Yes  Duration of Psychotic Symptoms:Greater than six months  Hallucinations:Hallucinations: None (Per staff responding to internal stimuli)   Ideas of Reference:Delusions  Suicidal Thoughts:Suicidal Thoughts: No   Homicidal Thoughts:Homicidal Thoughts: No    Sensorium  Memory: Immediate Fair  Judgment: Impaired  Insight: Shallow   Executive Functions  Concentration: Fair  Attention Span: Fair  Recall: Fair  Fund of Knowledge: Fair  Language: Fair   Psychomotor Activity  Psychomotor Activity:Psychomotor Activity: Normal    Assets  Assets: Social Support; Resilience; Physical Health; Financial Resources/Insurance   Sleep  Sleep:Sleep: Good Number of Hours of Sleep: 8.25     Physical Exam: Physical Exam Vitals and nursing note reviewed.  Constitutional:      General: [name] [name] is not in acute distress.    Appearance: Normal appearance. [name] [name] is normal weight. [name] [name] is not ill-appearing or toxic-appearing.  HENT:     Head: Normocephalic and atraumatic.  Pulmonary:     Effort: Pulmonary effort is normal.  Musculoskeletal:        General: Normal range of motion.  Neurological:     General: No focal deficit present.     Mental Status: [name] [name] is alert.    Review of Systems  Respiratory:  Negative for cough and shortness of breath.   Cardiovascular:  Negative for chest pain.  Gastrointestinal:  Negative for abdominal pain, constipation, diarrhea, nausea and vomiting.  Neurological:  Negative for dizziness, weakness and headaches.  Psychiatric/Behavioral:  Negative for depression, hallucinations and suicidal ideas. [name] [name] [name] [name] [name] is not nervous/anxious.    Blood pressure 111/82, pulse 77, temperature 98 F  (36.7 C), temperature source Oral, resp. rate 20, height 5\' 3"  (  1.6 m), weight 70 kg, SpO2 97%. Body mass index is 27.35 kg/m.   Treatment Plan Summary: Daily contact with [name] [name] [name] to assess and evaluate symptoms and progress in treatment and Medication management  Cervando Rairigh is a 42 yr old male who presented on 9/3 to [facility] with delusions and [name] [name] [name], [name] [name] to [facility] on 9/4.  PPHx is significant for Schizoaffective Disorder, Bipolar Type, Anxiety, Depression, and Polysubstance Abuse (Cocaine, Meth, EtOH, and an extensive history of aggression, and 6 Prior Psychiatric Hospitalizations (last [facility]- 10/2022), and no history of Suicide Attempts or Self Injurious Behavior. [name] [name] does have an ACT Team- [facility].    [place] has responded positively to stopping [name] [name] Invega and starting Haldol.  [name] [name] does show some improvement in thinking as [name] [name] did apologize for his behavior yesterday.  [name] [name] continues to be very labile and intrusive as well as sexually inappropriate so we will continue with [name] [name] one-to-one.  Social work will send updates to [facility] and inquire about his placement on their waiting list.  We will not make any changes to his medications at this time.  We will continue to monitor.   Schizoaffective Disorder, Bipolar Type: -Continue Haldol 5 mg BID for psychosis and mood stability -Continue Zyprexa 5 mg AM and 15 mg QHS for psychosis and mood stability -Continue Depakote DR 750 mg BID for mood stability. -Continue Agitation Protocol: Zyprexa/Ativan, Zyprexa.Benadryl, and Benadryl -Continue 1:1 -Referral sent to [facility]   -Continue Gabapentin 300 mg TID  -Continue Trazodone 300 mg QHS for sleep -Continue PRN's: Tylenol, Maalox, Atarax, Milk of Magnesia   Lauro Franklin, MD 03/22/2023, 12:33 PM

## 2023-03-22 NOTE — Group Note (Signed)
Recreation Therapy Group Note   Group Topic:Coping Skills  Group Date: 03/22/2023 Start Time: 1010 End Time: 1040 Facilitators: Cheron Coryell-McCall, LRT,CTRS Location: 500 Hall Dayroom   Group Topic: Coping Skills   Goal Area(s) Addresses: Patient will define what a coping skill is. Patient will work to create a list of healthy coping skills beginning with each letter of the alphabet. Patient will successfully identify positive coping skills they can use post d/c.   Intervention: Group work   Group Description: Coping A to Z. Patient asked to identify what a coping skill is and when they use them. Patients with Clinical research associate discussed healthy versus unhealthy coping skills. Next patients were given a blank worksheet titled "Coping Skills A-Z". Patients were instructed to come up with at least one positive coping skill per letter of the alphabet, addressing a specific challenge (ex: stress, anger, anxiety, depression, grief, doubt, isolation, self-harm/suicidal thoughts, substance use). Patients were given 15 minutes to brainstorm, before ideas were presented to the large group. Patients and LRT debriefed on the importance of coping skill selection based on situation and back-up plans when a skill tried is not effective. At the end of group, patients were given an handout of alphabetized strategies to keep for future reference.   Education: Pharmacologist, Scientist, physiological, Discharge Planning.    Education Outcome: Acknowledges education/Verbalizes understanding/In group clarification offered/Additional education needed    Affect/Mood: Flat   Participation Level: None   Participation Quality: None   Behavior: Reluctant   Speech/Thought Process: Disorganized and Incoherent   Insight: Lacking   Judgement: Lacking    Modes of Intervention: STEM Activity   Patient Response to Interventions:  Disengaged   Education Outcome:  In group clarification offered    Clinical  Observations/Individualized Feedback: Pt came into group long enough to get instructions. Pt left but came back later in session. Pt didn't participate and was disengaged throughout.    Plan: Continue to engage patient in RT group sessions 2-3x/week.   Chadric Kimberley-McCall, LRT,CTRS 03/22/2023 12:39 PM

## 2023-03-22 NOTE — Group Note (Signed)
LCSW Group Therapy Note    Group Date: 03/22/2023 Start Time: 1300 End Time: 1400   Type of Therapy and Topic: Group Therapy: Body Image  Participation Level:  None  Description of Group:  Patients were educated about body image and asked to think about whether they have a healthy or unhealthy body image. Patients were led in a discussion about factors that contribute to body image, both internal and external. Patients were asked to discuss strengths of the human body outside of appearance, such as being able to fight off diseases and provide stress relief. Lastly, patients were asked to identify one way in which they appreciate their own body outside of appearance.   Therapeutic Goals:   1. Patient will differentiate between a healthy and unhealthy body image. 2. Patient will identify what contributes to body image 3. Patient will discuss the strengths of the human body. 4. Patient will identify a positive attribute of their body outside of physical appearance.  Summary of Patient Progress:   Patient left before group even started   Therapeutic Modalities: Cognitive Behavioral Therapy; Solution-Focused Therapy  Isabella Bowens, LCSWA 03/22/2023  2:26 PM

## 2023-03-22 NOTE — Plan of Care (Signed)

## 2023-03-22 NOTE — Group Note (Signed)
Date:  03/22/2023 Time:  8:24 PM  Group Topic/Focus:  Wrap-Up Group:   The focus of this group is to help patients review their daily goal of treatment and discuss progress on daily workbooks.    Participation Level:  Did Not Attend   Scot Dock 03/22/2023, 8:24 PM

## 2023-03-22 NOTE — Progress Notes (Signed)
1:1 Note: Patient maintained on constant supervision for safety.  Medication given as prescribed.  Routine safety checks maintained.  Patient visible in milieu interacting with peers.  Patient attended group but left before group was over.  Support and encouragement offered as needed. Patient is safe on the unit with supervision.

## 2023-03-23 DIAGNOSIS — F250 Schizoaffective disorder, bipolar type: Secondary | ICD-10-CM | POA: Diagnosis not present

## 2023-03-23 MED ORDER — HALOPERIDOL LACTATE 2 MG/ML PO CONC
10.0000 mg | Freq: Two times a day (BID) | ORAL | Status: DC
  Administered 2023-03-23 – 2023-03-26 (×6): 10 mg via ORAL
  Filled 2023-03-23 (×10): qty 5

## 2023-03-23 MED ORDER — OLANZAPINE 10 MG PO TBDP
10.0000 mg | ORAL_TABLET | Freq: Every day | ORAL | Status: DC
  Administered 2023-03-24 – 2023-04-06 (×14): 10 mg via ORAL
  Filled 2023-03-23 (×16): qty 1

## 2023-03-23 MED ORDER — OLANZAPINE 10 MG PO TBDP
20.0000 mg | ORAL_TABLET | Freq: Every day | ORAL | Status: DC
  Administered 2023-03-23 – 2023-04-08 (×17): 20 mg via ORAL
  Filled 2023-03-23 (×19): qty 2

## 2023-03-23 MED ORDER — TEMAZEPAM 15 MG PO CAPS
15.0000 mg | ORAL_CAPSULE | Freq: Every day | ORAL | Status: DC
  Administered 2023-03-23 – 2023-04-08 (×17): 15 mg via ORAL
  Filled 2023-03-23 (×17): qty 1

## 2023-03-23 MED ORDER — HALOPERIDOL LACTATE 2 MG/ML PO CONC
5.0000 mg | Freq: Once | ORAL | Status: AC
  Administered 2023-03-23: 5 mg via ORAL
  Filled 2023-03-23: qty 5

## 2023-03-23 NOTE — Progress Notes (Signed)
Nursing 1:1 Note   D: Pt awake pacing in bedroom. Respirations noted even and unlabored.   A: Emotional support and availability provided. Minimizing stimuli. Offered rest. Maintaining 1: 1 for safety.  R: Pt remains in bedroom quietly pacing. Receptive to care interventions at this time. Remains safe on 1:1.

## 2023-03-23 NOTE — Progress Notes (Signed)
1:1 Note: Patient maintained on constant supervision for safety. Patient pacing the hallway talking and rapping to himself.  Medications given as prescribed.  Routine safety checks maintained.  Attended group and participated.  Patient is safe on the unit.

## 2023-03-23 NOTE — Progress Notes (Signed)
1:1 Nursing note:  Pt is laying in bed . Respirations are even and unlabored. No signs of distress. 1:1 continued for pt safety. Safety maintained.  

## 2023-03-23 NOTE — Plan of Care (Signed)
  Problem: Education: Goal: Emotional status will improve Outcome: Progressing   Problem: Activity: Goal: Interest or engagement in activities will improve Outcome: Progressing Goal: Sleeping patterns will improve Outcome: Progressing   Problem: Safety: Goal: Periods of time without injury will increase Outcome: Progressing   Problem: Education: Goal: Mental status will improve Outcome: Not Progressing

## 2023-03-23 NOTE — Group Note (Signed)
Recreation Therapy Group Note   Group Topic:Animal Assisted Therapy   Group Date: 03/23/2023 Start Time: 0950 End Time: 1035 Facilitators: Delinda Malan, Benito Mccreedy, LRT   Animal-Assisted Activity (AAA) Program Checklist/Progress Note Patient Eligibility Criteria Checklist & Daily Group note for Rec Tx Intervention   AAA/T Program Assumption of Risk Form signed by Patient/ or Parent Legal Guardian YES   Group Description: Patients provided opportunity to interact with trained and credentialed [employer] Therapy dog and the community volunteer/dog handler.    Affect/Mood: N/A   Participation Level: Did not attend    Clinical Observations/Individualized Feedback: Pt did not meet criteria to engage in AAA programming. Pt remained on [address] hall, BICU with staff monitoring for safety.   Benito Mccreedy Jovonne Wilton, LRT, CTRS 03/23/2023 12:18 PM

## 2023-03-23 NOTE — Progress Notes (Signed)
1:1 Note: Patient maintained on constant supervision for safety.  Medication given as prescribed.  Patient in his room after dinner. Routine safety checks maintained.

## 2023-03-23 NOTE — Progress Notes (Signed)
[facility] MD Progress Note  03/23/2023 1:50 PM [name] [name]  MRN:  [id number] Subjective:   [name] [name] is a 42 yr old male who presented on 9/3 to [facility] with delusions and paranoia, he was admitted to [facility] on 9/4.  PPHx is significant for Schizoaffective Disorder, Bipolar Type, Anxiety, Depression, and Polysubstance Abuse (Cocaine, Meth, EtOH, and an extensive history of aggression, and 6 Prior Psychiatric Hospitalizations (last [facility]- 10/2022), and no history of Suicide Attempts or Self Injurious Behavior. He does have an ACT Team- [facility].   Case was discussed in the multidisciplinary team. MAR was reviewed and patient was compliant with medications.  He received PRN Tylenol and Hydroxyzine yesterday.   Psychiatric Team made the following recommendations yesterday: -Continue Haldol 5 mg BID for psychosis and mood stability -Continue Zyprexa 5 mg AM and 15 mg QHS for psychosis and mood stability -Continue Depakote DR 750 mg BID for mood stability. -Continue Agitation Protocol: Zyprexa/Ativan, Zyprexa.Benadryl, and Benadryl -Continue 1:1    On interview today patient reports he slept good last night (per report selpt 4.5 hrs).  He reports his appetite is doing good.  He reports no SI, HI, or AVH (noted by staff to be responding to internal stimuli).  He reports paranoia- that the medication he is being given is battery acid and poison to kill him.  He reports no Ideas of Reference.  He reports no issues with his medications.  He reports that he is planning to join the [employer]  He reports no other concerns at present.    Principal Problem: Schizoaffective disorder, bipolar type (HCC) Diagnosis: Principal Problem:   Schizoaffective disorder, bipolar type (HCC) Active Problems:   Substance-induced psychotic disorder (HCC)  Total Time spent with patient:  I personally spent 35 minutes on the unit in direct patient care. The direct patient care time included face-to-face time with the patient,  reviewing the patient's chart, communicating with other professionals, and coordinating care. Greater than 50% of this time was spent in counseling or coordinating care with the patient regarding goals of hospitalization, psycho-education, and discharge planning needs.   Past Psychiatric History: Schizoaffective Disorder, Bipolar Type, Anxiety, Depression, and Polysubstance Abuse (Cocaine, Meth, EtOH, and an extensive history of aggression, and 6 Prior Psychiatric Hospitalizations (last [facility]- 10/2022), and no history of Suicide Attempts or Self Injurious Behavior.  He does have an ACT Team- [facility].  Past Medical History:  Past Medical History:  Diagnosis Date   Anxiety    Depression    Mental disorder    Schizophrenia (HCC)    History reviewed. No pertinent surgical history. Family History:  Family History  Problem Relation Age of Onset   Mental illness Neg Hx    Family Psychiatric  History: Reports None 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, Pt uses meth    Social History   Socioeconomic History   Marital status: Married    Spouse name: Not on file   Number of children: 1   Years of education: Not on file   Highest education level: Not on file  Occupational History   Occupation: Disabled  Tobacco Use   Smoking status: Every Day    Current packs/day: 1.00    Types: Cigarettes   Smokeless  tobacco: Former  Building services engineer status: Unknown  Substance and Sexual Activity   Alcohol use: Not Currently    Comment: haven't drank in months"   Drug use: Yes    Types: Marijuana, Methamphetamines    Comment: Per sister, Pt uses meth   Sexual activity: Yes    Birth control/protection: None  Other Topics Concern   Not on file  Social History  Narrative   Pt refusing to answer any questions during assessment. Pt lives with mother who IVC'd him.      Pt lives with parents and sister.  He is married, but currently separated.  Pt is on disability.   Social Determinants of Health   Financial Resource Strain: Not on file  Food Insecurity: Patient Unable To Answer (03/09/2023)   Hunger Vital Sign    Worried About Running Out of Food in the Last Year: Patient unable to answer    Ran Out of Food in the Last Year: Patient unable to answer  Transportation Needs: Patient Unable To Answer (03/09/2023)   PRAPARE - Administrator, Civil Service (Medical): Patient unable to answer    Lack of Transportation (Non-Medical): Patient unable to answer  Physical Activity: Not on file  Stress: Not on file  Social Connections: Not on file   Additional Social History:                         Sleep: Poor (per report slept 4.5 hrs, per patient good)  Appetite:  Good  Current Medications: Current Facility-Administered Medications  Medication Dose Route Frequency Provider Last Rate Last Admin   acetaminophen (TYLENOL) tablet 650 mg  650 mg Oral Q6H PRN Ardis Hughs, NP   650 mg at 03/22/23 2049   alum & mag hydroxide-simeth (MAALOX/MYLANTA) 200-200-20 MG/5ML suspension 30 mL  30 mL Oral Q4H PRN Ardis Hughs, NP       benztropine (COGENTIN) tablet 0.5 mg  0.5 mg Oral BID Lauro Franklin, MD   0.5 mg at 03/23/23 8295   diphenhydrAMINE (BENADRYL) capsule 50 mg  50 mg Oral TID PRN Armandina Stammer I, NP   50 mg at 03/21/23 0640   OLANZapine (ZYPREXA) injection 5 mg  5 mg Intramuscular TID PRN Bobbitt, Shalon E, NP   5 mg at 03/16/23 1052   And   diphenhydrAMINE (BENADRYL) injection 50 mg  50 mg Intramuscular TID PRN Bobbitt, Shalon E, NP   50 mg at 03/16/23 1052   divalproex (DEPAKOTE) DR tablet 750 mg  750 mg Oral BID Lauro Franklin, MD   750 mg at 03/23/23 6213   gabapentin (NEURONTIN) capsule 300 mg  300 mg  Oral TID Armandina Stammer I, NP   300 mg at 03/23/23 0865   haloperidol (HALDOL) 2 MG/ML solution 10 mg  10 mg Oral BID Lauro Franklin, MD       hydrOXYzine (ATARAX) tablet 50 mg  50 mg Oral Q4H PRN Armandina Stammer I, NP   50 mg at 03/23/23 1237   OLANZapine (ZYPREXA) tablet 5 mg  5 mg Oral TID PRN Armandina Stammer I, NP   5 mg at 03/21/23 7846   And   LORazepam (ATIVAN) tablet 1 mg  1 mg Oral TID PRN Armandina Stammer I, NP   1 mg at 03/21/23 0640   magnesium hydroxide (MILK OF MAGNESIA) suspension 30 mL  30 mL Oral Daily PRN Ardis Hughs, NP       [  START ON 03/24/2023] OLANZapine zydis (ZYPREXA) disintegrating tablet 10 mg  10 mg Oral Daily Areli Frary, Mardelle Matte, MD       OLANZapine zydis (ZYPREXA) disintegrating tablet 20 mg  20 mg Oral QHS Melannie Metzner, Mardelle Matte, MD       temazepam (RESTORIL) capsule 15 mg  15 mg Oral QHS Lauro Franklin, MD       traZODone (DESYREL) tablet 300 mg  300 mg Oral QHS Armandina Stammer I, NP   300 mg at 03/22/23 2049    Lab Results:  No results found for this or any previous visit (from the past 48 hour(s)).   Blood Alcohol level:  Lab Results  Component Value Date   ETH <10 03/09/2023   ETH <10 10/05/2022    Metabolic Disorder Labs: Lab Results  Component Value Date   HGBA1C 5.7 (H) 10/07/2022   MPG 117 10/07/2022   MPG 114 10/05/2022   Lab Results  Component Value Date   PROLACTIN 17.4 10/05/2022   PROLACTIN 45.8 (H) 12/19/2017   Lab Results  Component Value Date   CHOL 156 10/05/2022   TRIG 116 10/05/2022   HDL 40 (L) 10/05/2022   CHOLHDL 3.9 10/05/2022   VLDL 23 10/05/2022   LDLCALC 93 10/05/2022   LDLCALC 69 11/21/2020    Physical Findings: AIMS:  , ,  ,  ,    CIWA:    COWS:     Musculoskeletal: Strength & Muscle Tone: within normal limits Gait & Station: normal Patient leans: N/A  Psychiatric Specialty Exam:  Presentation  General Appearance:  Casual  Eye Contact: Fair  Speech: Clear and Coherent; Normal  Rate  Speech Volume: Normal  Handedness: Right   Mood and Affect  Mood: -- ("great")  Affect: Non-Congruent; Labile   Thought Process  Thought Processes: Disorganized  Descriptions of Associations:Loose  Orientation:Full (Time, Place and Person)  Thought Content:Delusions; Paranoid Ideation  History of Schizophrenia/Schizoaffective disorder:Yes  Duration of Psychotic Symptoms:Greater than six months  Hallucinations:Hallucinations: Other (comment) (per staff responding to internal stimuli)   Ideas of Reference:Paranoia; Delusions  Suicidal Thoughts:Suicidal Thoughts: No   Homicidal Thoughts:Homicidal Thoughts: No    Sensorium  Memory: Immediate Fair  Judgment: Impaired  Insight: Lacking   Executive Functions  Concentration: Fair  Attention Span: Fair  Recall: Fair  Fund of Knowledge: Fair  Language: Fair   Psychomotor Activity  Psychomotor Activity:Psychomotor Activity: Normal    Assets  Assets: Financial Resources/Insurance; Physical Health; Resilience   Sleep  Sleep:Sleep: Poor Number of Hours of Sleep: 4.25     Physical Exam: Physical Exam Vitals and nursing note reviewed.  Constitutional:      General: He is not in acute distress.    Appearance: Normal appearance. He is normal weight. He is not ill-appearing or toxic-appearing.  HENT:     Head: Normocephalic and atraumatic.  Pulmonary:     Effort: Pulmonary effort is normal.  Musculoskeletal:        General: Normal range of motion.  Neurological:     General: No focal deficit present.     Mental Status: He is alert.    Review of Systems  Respiratory:  Negative for cough and shortness of breath.   Cardiovascular:  Negative for chest pain.  Gastrointestinal:  Negative for abdominal pain, constipation, diarrhea, nausea and vomiting.  Neurological:  Negative for dizziness, weakness and headaches.  Psychiatric/Behavioral:  Negative for depression,  hallucinations and suicidal ideas. The patient is not nervous/anxious.    Blood pressure 122/86,  pulse 85, temperature 97.8 F (36.6 C), temperature source Oral, resp. rate 20, height 5\' 3"  (1.6 m), weight 70 kg, SpO2 97%. Body mass index is 27.35 kg/m.   Treatment Plan Summary: Daily contact with patient to assess and evaluate symptoms and progress in treatment and Medication management  Geoffrey Dicroce is a 42 yr old male who presented on 9/3 to [facility] with delusions and paranoia, he was admitted to [facility] on 9/4.  PPHx is significant for Schizoaffective Disorder, Bipolar Type, Anxiety, Depression, and Polysubstance Abuse (Cocaine, Meth, EtOH, and an extensive history of aggression, and 6 Prior Psychiatric Hospitalizations (last [facility]- 10/2022), and no history of Suicide Attempts or Self Injurious Behavior. He does have an ACT Team- [facility].    Tryan despite his current medication regimen continues to be delusional, hypersexual, and intrusive.  He continues to be very labile and so we will continue with the one-to-one monitoring for now.  Due to the Depakote level drawn on 9/14 of 78 we do feel like he is taking his medications.  We will further increase his Haldol today and also switch it to liquid to reduce the chance of cheeking.  We will also increase his Zyprexa starting this evening.  Despite the doses he is already on he is showing no EPS and does seem to be refractory to treatment which is why we are increasing both of them.  We will continue to monitor.   Schizoaffective Disorder, Bipolar Type: -Increase Haldol to 10 mg BID for psychosis and mood stability -Increase Zyprexa to 5 mg AM and 20 mg QHS for psychosis and mood stability -Continue Depakote DR 750 mg BID for mood stability. -Continue Agitation Protocol: Zyprexa/Ativan, Zyprexa.Benadryl, and Benadryl -Continue 1:1 -Referral sent to [facility]   -Continue Gabapentin 300 mg TID  -Continue Trazodone 300 mg QHS for sleep -Continue  PRN's: Tylenol, Maalox, Atarax, Milk of Magnesia   Lauro Franklin, MD 03/23/2023, 1:50 PM

## 2023-03-23 NOTE — Progress Notes (Signed)
   03/23/23 2100  Psych Admission Type (Psych Patients Only)  Admission Status Involuntary  Psychosocial Assessment  Patient Complaints None  Eye Contact Fair  Facial Expression Anxious;Animated  Affect Preoccupied;Silly  Glass blower/designer;Restless  Appearance/Hygiene Layered clothes  Behavior Characteristics Cooperative  Mood Suspicious;Preoccupied;Pleasant  Aggressive Behavior  Effect No apparent injury  Thought Process  Coherency Disorganized  Content Delusions;Preoccupation  Delusions Grandeur  Perception Hallucinations;Derealization  Hallucination Auditory  Judgment Impaired  Confusion None  Danger to Self  Current suicidal ideation? Denies  Danger to Others  Danger to Others None reported or observed

## 2023-03-23 NOTE — Progress Notes (Signed)
   03/23/23 0559  15 Minute Checks  Location Bedroom (Simultaneous filing. User may not have seen previous data.)  Visual Appearance Calm (Simultaneous filing. User may not have seen previous data.)  Behavior Composed  Sleep (Behavioral Health Patients Only)  Calculate sleep? (Click Yes once per 24 hr at 0600 safety check) Yes  Documented sleep last 24 hours 4.25

## 2023-03-23 NOTE — Progress Notes (Signed)
1:1 Note: Patient maintained on constant supervision for safety.  Patient visible in milieu interacting with staff and peers.  Needed redirection on the unit regarding sharing personal items with peers.  Multiple change of clothes throughout the shift.  Routine safety checks maintained.  Patient off the unit for meals.  No issues noted or reported.

## 2023-03-23 NOTE — Progress Notes (Signed)
CSW called [facility] and spoke with admissions and was told that the referral that had been sent was "scrapped" after 72 hours. CSW was advised to re-submit the referral and call to provide to [facility] within 72 hours of submission. CSW will discuss with provider.

## 2023-03-23 NOTE — Group Note (Signed)
Date:  03/23/2023 Time:  9:50 AM  Group Topic/Focus:  Goals Group:   The focus of this group is to help patients establish daily goals to achieve during treatment and discuss how the patient can incorporate goal setting into their daily lives to aide in recovery.    Participation Level:  Active  Participation Quality:  Appropriate  Affect:  Flat  Cognitive:  Alert  Insight: Good  Engagement in Group:  Distracting  Modes of Intervention:  Discussion  Additional Comments:    Beckie Busing 03/23/2023, 9:50 AM

## 2023-03-23 NOTE — Group Note (Signed)
Date:  03/23/2023 Time:  8:33 PM  Group Topic/Focus:  Wrap-Up Group:   The focus of this group is to help patients review their daily goal of treatment and discuss progress on daily workbooks.    Participation Level:  Did Not Attend   Scot Dock 03/23/2023, 8:33 PM

## 2023-03-24 ENCOUNTER — Encounter (HOSPITAL_COMMUNITY): Payer: Self-pay

## 2023-03-24 DIAGNOSIS — F250 Schizoaffective disorder, bipolar type: Secondary | ICD-10-CM | POA: Diagnosis not present

## 2023-03-24 NOTE — Progress Notes (Signed)
CSW resent [facility] referral . CSW spoke with staff member "Katrinka Blazing" and provided answers about the pt's demographics and vaccines. Will continue to monitor.

## 2023-03-24 NOTE — Progress Notes (Signed)
   03/24/23 0815  Psych Admission Type (Psych Patients Only)  Admission Status Involuntary  Psychosocial Assessment  Patient Complaints None  Eye Contact Fair  Facial Expression Animated  Affect Silly  Speech Tangential  Interaction Flirtatious  Motor Activity Restless  Appearance/Hygiene Layered clothes  Behavior Characteristics Cooperative  Mood Preoccupied  Thought Process  Coherency Disorganized  Content Delusions;Preoccupation  Delusions Erotomanic;Grandeur  Perception Hallucinations;Derealization  Hallucination Auditory  Judgment Impaired  Confusion None  Danger to Self  Current suicidal ideation? Denies  Danger to Others  Danger to Others None reported or observed

## 2023-03-24 NOTE — [facility] Group Notes (Signed)
Adult Psychoeducational Group Note  Date:  03/24/2023 Time:  8:38 PM  Group Topic/Focus:  Wrap-Up Group:   The focus of this group is to help patients review their daily goal of treatment and discuss progress on daily workbooks.  Participation Level:  Did Not Attend   Additional Comments:  Pt did not attend wrap up group  [name] [name] [name] 03/24/2023, 8:38 PM

## 2023-03-24 NOTE — Plan of Care (Signed)
°  Problem: Education: °Goal: Emotional status will improve °Outcome: Progressing °Goal: Mental status will improve °Outcome: Progressing °Goal: Verbalization of understanding the information provided will improve °Outcome: Progressing °  °

## 2023-03-24 NOTE — Progress Notes (Signed)
1:1 Nursing Note:  Patient in his room sleeping. No distress noted. 1:1 monitoring continues. Patient remains safe at this time.

## 2023-03-24 NOTE — Progress Notes (Signed)
   03/24/23 2200  Psych Admission Type (Psych Patients Only)  Admission Status Involuntary  Psychosocial Assessment  Patient Complaints None  Eye Contact Fair  Facial Expression Anxious;Animated  Affect Preoccupied;Silly  Glass blower/designer;Restless  Appearance/Hygiene Layered clothes  Behavior Characteristics Cooperative  Mood Preoccupied;Suspicious  Aggressive Behavior  Effect No apparent injury  Thought Process  Coherency Disorganized  Content Delusions;Preoccupation  Delusions Grandeur  Perception Hallucinations;Derealization  Hallucination Auditory  Judgment Impaired  Confusion None  Danger to Self  Current suicidal ideation? Denies  Danger to Others  Danger to Others None reported or observed

## 2023-03-24 NOTE — Progress Notes (Signed)
[facility] MD Progress Note  03/24/2023 10:40 AM [name] [name]  MRN:  [id number] Subjective:   [name] [name] is a 42 yr old male who presented on 9/3 to [facility] with delusions and paranoia, he was admitted to [facility] on 9/4.  PPHx is significant for Schizoaffective Disorder, Bipolar Type, Anxiety, Depression, and Polysubstance Abuse (Cocaine, Meth, EtOH, and an extensive history of aggression, and 6 Prior Psychiatric Hospitalizations (last [facility]- 10/2022), and no history of Suicide Attempts or Self Injurious Behavior. He does have an ACT Team- [facility].   Case was discussed in the multidisciplinary team. MAR was reviewed and patient was compliant with medications.  He received PRN Tylenol and Hydroxyzine yesterday.   Psychiatric Team made the following recommendations yesterday: -Increase Haldol to 10 mg BID for psychosis and mood stability -Increase Zyprexa to 10 mg AM and 20 mg QHS for psychosis and mood stability -Continue Depakote DR 750 mg BID for mood stability. -Continue Agitation Protocol: Zyprexa/Ativan, Zyprexa.Benadryl, and Benadryl -Continue 1:1 -Start Restoril 15 mg QHS    On interview today patient reports he slept good last night.  He reports his appetite is doing good.  He reports no SI, HI, or AVH.  He reports no Ideas of Reference.  He reports no issues with his medications except he reports they are poison.  He reports that they taste like battery acid and are killing him.  He reports that when he is discharged he plans to open a restaurant.  He reports that it is going to be a seafood restaurant with clams and crab.  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  He reports that he has had some back pain but that it is better today since he is not walking around constantly.  He reports no other concerns at present.    Principal Problem: Schizoaffective disorder, bipolar type (HCC) Diagnosis: Principal Problem:    Schizoaffective disorder, bipolar type (HCC) Active Problems:   Substance-induced psychotic disorder (HCC)  Total Time spent with patient:  I personally spent 35 minutes on the unit in direct patient care. The direct patient care time included face-to-face time with the patient, reviewing the patient's chart, communicating with other professionals, and coordinating care. Greater than 50% of this time was spent in counseling or coordinating care with the patient regarding goals of hospitalization, psycho-education, and discharge planning needs.   Past Psychiatric History: Schizoaffective Disorder, Bipolar Type, Anxiety, Depression, and Polysubstance Abuse (Cocaine, Meth, EtOH, and an extensive history of aggression, and 6 Prior Psychiatric Hospitalizations (last [facility]- 10/2022), and no history of Suicide Attempts or Self Injurious Behavior.  He does have an ACT Team- [facility].  Past Medical History:  Past Medical History:  Diagnosis Date   Anxiety    Depression    Mental disorder    Schizophrenia (HCC)    History reviewed. No pertinent surgical history. Family History:  Family History  Problem Relation Age of Onset   Mental illness Neg Hx    Family Psychiatric  History: Reports None 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, Pt uses meth    Social History   Socioeconomic History   Marital status: Married    Spouse  name: Not on file   Number of children: 1   Years of education: Not on file   Highest education level: Not on file  Occupational History   Occupation: Disabled  Tobacco Use   Smoking status: Every Day    Current packs/day: 1.00    Types: Cigarettes   Smokeless tobacco: Former  Building services engineer status: Unknown  Substance and Sexual Activity    Alcohol use: Not Currently    Comment: haven't drank in months"   Drug use: Yes    Types: Marijuana, Methamphetamines    Comment: 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 Pt lives with mother who IVC'd him.      Pt lives with parents and sister.  He is married, but currently separated.  Pt is on disability.   Social Determinants of Health   Financial Resource Strain: Not on file  Food Insecurity: Patient Unable To Answer (03/09/2023)   Hunger Vital Sign    Worried About Running Out of Food in the Last Year: Patient unable to answer    Ran Out of Food in the Last Year: Patient unable to answer  Transportation Needs: Patient Unable To Answer (03/09/2023)   PRAPARE - Administrator, Civil Service (Medical): Patient unable to answer    Lack of Transportation (Non-Medical): Patient unable to answer  Physical Activity: Not on file  Stress: Not on file  Social Connections: Not on file   Additional Social History:                         Sleep: Poor (per report slept 4.5 hrs, per patient good)  Appetite:  Good  Current Medications: Current Facility-Administered Medications  Medication Dose Route Frequency Provider Last Rate Last Admin   acetaminophen (TYLENOL) tablet 650 mg  650 mg Oral Q6H PRN Ardis Hughs, NP   650 mg at 03/22/23 2049   alum & mag hydroxide-simeth (MAALOX/MYLANTA) 200-200-20 MG/5ML suspension 30 mL  30 mL Oral Q4H PRN Ardis Hughs, NP       benztropine (COGENTIN) tablet 0.5 mg  0.5 mg Oral BID Lauro Franklin, MD   0.5 mg at 03/24/23 8469   diphenhydrAMINE (BENADRYL) capsule 50 mg  50 mg Oral TID PRN Armandina Stammer I, NP   50 mg at 03/21/23 0640   OLANZapine (ZYPREXA) injection 5 mg  5 mg Intramuscular TID PRN Bobbitt, Shalon E, NP   5 mg at 03/16/23 1052   And    diphenhydrAMINE (BENADRYL) injection 50 mg  50 mg Intramuscular TID PRN Bobbitt, Shalon E, NP   50 mg at 03/16/23 1052   divalproex (DEPAKOTE) DR tablet 750 mg  750 mg Oral BID Lauro Franklin, MD   750 mg at 03/24/23 0829   gabapentin (NEURONTIN) capsule 300 mg  300 mg Oral TID Armandina Stammer I, NP   300 mg at 03/24/23 0829   haloperidol (HALDOL) 2 MG/ML solution 10 mg  10 mg Oral BID Lauro Franklin, MD   10 mg at 03/24/23 0837   hydrOXYzine (ATARAX) tablet 50 mg  50 mg Oral Q4H PRN Armandina Stammer I, NP   50 mg at 03/24/23 0157   OLANZapine (ZYPREXA) tablet 5 mg  5 mg Oral TID PRN Armandina Stammer  I, NP   5 mg at 03/21/23 0641   And   LORazepam (ATIVAN) tablet 1 mg  1 mg Oral TID PRN Armandina Stammer I, NP   1 mg at 03/21/23 0640   magnesium hydroxide (MILK OF MAGNESIA) suspension 30 mL  30 mL Oral Daily PRN Ardis Hughs, NP       OLANZapine zydis (ZYPREXA) disintegrating tablet 10 mg  10 mg Oral Daily Lauro Franklin, MD   10 mg at 03/24/23 0829   OLANZapine zydis (ZYPREXA) disintegrating tablet 20 mg  20 mg Oral QHS Lauro Franklin, MD   20 mg at 03/23/23 2059   temazepam (RESTORIL) capsule 15 mg  15 mg Oral QHS Lauro Franklin, MD   15 mg at 03/23/23 2058   traZODone (DESYREL) tablet 300 mg  300 mg Oral QHS Armandina Stammer I, NP   300 mg at 03/23/23 2058    Lab Results:  No results found for this or any previous visit (from the past 48 hour(s)).   Blood Alcohol level:  Lab Results  Component Value Date   ETH <10 03/09/2023   ETH <10 10/05/2022    Metabolic Disorder Labs: Lab Results  Component Value Date   HGBA1C 5.7 (H) 10/07/2022   MPG 117 10/07/2022   MPG 114 10/05/2022   Lab Results  Component Value Date   PROLACTIN 17.4 10/05/2022   PROLACTIN 45.8 (H) 12/19/2017   Lab Results  Component Value Date   CHOL 156 10/05/2022   TRIG 116 10/05/2022   HDL 40 (L) 10/05/2022   CHOLHDL 3.9 10/05/2022   VLDL 23 10/05/2022   LDLCALC 93 10/05/2022    LDLCALC 69 11/21/2020    Physical Findings: AIMS:  , ,  ,  ,    CIWA:    COWS:     Musculoskeletal: Strength & Muscle Tone: within normal limits Gait & Station: normal Patient leans: N/A  Psychiatric Specialty Exam:  Presentation  General Appearance:  Casual  Eye Contact: Fair  Speech: Clear and Coherent; Normal Rate  Speech Volume: Normal  Handedness: Right   Mood and Affect  Mood: -- ("good")  Affect: Labile   Thought Process  Thought Processes: Disorganized (but improved)  Descriptions of Associations:Loose  Orientation:Full (Time, Place and Person)  Thought Content:Delusions; Paranoid Ideation  History of Schizophrenia/Schizoaffective disorder:Yes  Duration of Psychotic Symptoms:Greater than six months  Hallucinations:Hallucinations: Other (comment) (per staff responding to internal stimuli)   Ideas of Reference:Delusions  Suicidal Thoughts:Suicidal Thoughts: No   Homicidal Thoughts:Homicidal Thoughts: No    Sensorium  Memory: Immediate Fair  Judgment: Impaired  Insight: Lacking   Executive Functions  Concentration: Fair  Attention Span: Fair  Recall: Fair  Fund of Knowledge: Fair  Language: Fair   Psychomotor Activity  Psychomotor Activity:Psychomotor Activity: Normal    Assets  Assets: Financial Resources/Insurance; Resilience; Physical Health   Sleep  Sleep:Sleep: Good Number of Hours of Sleep: 8     Physical Exam: Physical Exam Vitals and nursing note reviewed.  Constitutional:      General: He is not in acute distress.    Appearance: Normal appearance. He is normal weight. He is not ill-appearing or toxic-appearing.  HENT:     Head: Normocephalic and atraumatic.  Pulmonary:     Effort: Pulmonary effort is normal.  Musculoskeletal:        General: Normal range of motion.  Neurological:     General: No focal deficit present.     Mental Status: He is  alert.    Review of Systems   Respiratory:  Negative for cough and shortness of breath.   Cardiovascular:  Negative for chest pain.  Gastrointestinal:  Negative for abdominal pain, constipation, diarrhea, nausea and vomiting.  Neurological:  Negative for dizziness, weakness and headaches.  Psychiatric/Behavioral:  Negative for depression, hallucinations and suicidal ideas. The patient is not nervous/anxious.    Blood pressure 123/80, pulse 77, temperature 98 F (36.7 C), temperature source Oral, resp. rate 20, height 5\' 3"  (1.6 m), weight 70 kg, SpO2 98%. Body mass index is 27.35 kg/m.   Treatment Plan Summary: Daily contact with patient to assess and evaluate symptoms and progress in treatment and Medication management  Chetan Hamell is a 42 yr old male who presented on 9/3 to [facility] with delusions and paranoia, he was admitted to [facility] on 9/4.  PPHx is significant for Schizoaffective Disorder, Bipolar Type, Anxiety, Depression, and Polysubstance Abuse (Cocaine, Meth, EtOH, and an extensive history of aggression, and 6 Prior Psychiatric Hospitalizations (last [facility]- 10/2022), and no history of Suicide Attempts or Self Injurious Behavior. He does have an ACT Team- [facility].    Deloy has had some small response to the medication changes made yesterday as he was able to sit in the dayroom and watch movies and was able to sleep for 8 hours last night.  However, he continues to be delusional, intrusive, and hypersexual.  We will continue with the 1:1 monitoring.  A new referral was sent to [facility] yesterday and we will continue to follow up with them given he required CRH in the past.  We will not make any changes to his medications at this time due to 3 changes made yesterday.  We will continue to monitor.     Schizoaffective Disorder, Bipolar Type: -Continue Haldol 10 mg BID for psychosis and mood stability -Continue Zyprexa 10 mg AM and 20 mg QHS for psychosis and mood stability -Continue Depakote DR 750 mg BID for mood  stability. -Continue Agitation Protocol: Zyprexa/Ativan, Zyprexa.Benadryl, and Benadryl -Continue 1:1 -New Referral sent to [facility] yesterday   -Continue Restoril 15 mg QHS for insomnia -Continue Gabapentin 300 mg TID  -Continue Trazodone 300 mg QHS for sleep -Continue PRN's: Tylenol, Maalox, Atarax, Milk of Magnesia   Lauro Franklin, MD 03/24/2023, 10:40 AM

## 2023-03-24 NOTE — Progress Notes (Signed)
Nursing 1:1 Note:  Patient returned from dinner and was seen walking the halls. Patient pleasant and cooperative. 1:1 monitoring continues. Patient remains safe at this time.

## 2023-03-24 NOTE — Progress Notes (Signed)
1:1 Note:  Patient in the dayroom eating a snack. No distress noted. 1:1 monitoring continues. Patient remains safe at this time.

## 2023-03-24 NOTE — Progress Notes (Signed)
   03/24/23 0557  15 Minute Checks  Location Bedroom  Visual Appearance Calm  Behavior Composed  Sleep (Behavioral Health Patients Only)  Calculate sleep? (Click Yes once per 24 hr at 0600 safety check) Yes  Documented sleep last 24 hours 8

## 2023-03-24 NOTE — Progress Notes (Signed)
1:1 Nursing note: Pt is laying in bed . Respirations are even and unlabored. No signs of distress. 1:1 continued for pt safety. Safety maintained.

## 2023-03-24 NOTE — Progress Notes (Signed)
Nursing 1:1 Note   D: Pt awake pacing in bedroom. Respirations noted even and unlabored. Pt appears anxious.   A: Water offered. Emotional support and availability provided. Minimizing stimuli. Offered rest. Offered to PRN Hydroxyzine. Maintaining 1: 1 for safety.  R: Pt remains awake walking in bedroom. Provided pt with PRN Hydroxyzine per MAR. Receptive to care interventions at this time. Remains safe on 1:1.

## 2023-03-24 NOTE — Progress Notes (Signed)
Nursing 1:1 note D:Pt observed sleeping in bed with eyes closed. RR even and unlabored. No distress noted. A: 1:1 observation continues for safety  R: pt remains safe  

## 2023-03-24 NOTE — Group Note (Signed)
Recreation Therapy Group Note   Group Topic:Leisure Education  Group Date: 03/24/2023 Start Time: 1030 End Time: 1105 Facilitators: Danny Yackley-McCall, LRT,CTRS Location: 500 Hall Dayroom   Group Topic: Leisure Education   Goal Area(s) Addresses:  Patient will successfully identify positive leisure and recreation activities.  Patient will acknowledge benefits of participation in healthy leisure activities post discharge.  Patient will actively work with peers toward a shared goal.   Intervention: Group Game    Group Description: Music Trivia. LRT asked patients music questions about oldies but goodies and 90s, 2000s hip hop and r&b. In one group, patients worked together to figure out the next lyric to the songs presented in group. The activity was also used to show patients that leisure didn't have to be an expensive activity and can be enjoyed as a group as well as individually.    Education:  Teacher, English as a foreign language, Leisure as Merchant navy officer, Programmer, applications, Building control surveyor   Education Outcome: Acknowledges education/In group clarification offered/Needs additional education   Affect/Mood: N/A   Participation Level: Did not attend    Clinical Observations/Individualized Feedback:     Plan: Continue to engage patient in RT group sessions 2-3x/week.   Norwood Quezada-McCall, LRT,CTRS 03/24/2023 1:17 PM

## 2023-03-24 NOTE — BH IP Treatment Plan (Signed)
Interdisciplinary Treatment and Diagnostic Plan Update  03/24/2023 Time of Session: 1335 [name] [name] MRN: [id number]  Principal Diagnosis: Schizoaffective disorder, bipolar type [facility])  Secondary Diagnoses: Principal Problem:   Schizoaffective disorder, bipolar type (HCC) Active Problems:   Substance-induced psychotic disorder (HCC)   Current Medications:  Current Facility-Administered Medications  Medication Dose Route Frequency Provider Last Rate Last Admin   acetaminophen (TYLENOL) tablet 650 mg  650 mg Oral Q6H PRN Ardis Hughs, NP   650 mg at 03/22/23 2049   alum & mag hydroxide-simeth (MAALOX/MYLANTA) 200-200-20 MG/5ML suspension 30 mL  30 mL Oral Q4H PRN Ardis Hughs, NP       benztropine (COGENTIN) tablet 0.5 mg  0.5 mg Oral BID Lauro Franklin, MD   0.5 mg at 03/24/23 1191   diphenhydrAMINE (BENADRYL) capsule 50 mg  50 mg Oral TID PRN Armandina Stammer I, NP   50 mg at 03/21/23 0640   OLANZapine (ZYPREXA) injection 5 mg  5 mg Intramuscular TID PRN Bobbitt, Shalon E, NP   5 mg at 03/16/23 1052   And   diphenhydrAMINE (BENADRYL) injection 50 mg  50 mg Intramuscular TID PRN Bobbitt, Shalon E, NP   50 mg at 03/16/23 1052   divalproex (DEPAKOTE) DR tablet 750 mg  750 mg Oral BID Lauro Franklin, MD   750 mg at 03/24/23 4782   gabapentin (NEURONTIN) capsule 300 mg  300 mg Oral TID Armandina Stammer I, NP   300 mg at 03/24/23 0829   haloperidol (HALDOL) 2 MG/ML solution 10 mg  10 mg Oral BID Lauro Franklin, MD   10 mg at 03/24/23 0837   hydrOXYzine (ATARAX) tablet 50 mg  50 mg Oral Q4H PRN Armandina Stammer I, NP   50 mg at 03/24/23 0157   OLANZapine (ZYPREXA) tablet 5 mg  5 mg Oral TID PRN Armandina Stammer I, NP   5 mg at 03/21/23 9562   And   LORazepam (ATIVAN) tablet 1 mg  1 mg Oral TID PRN Armandina Stammer I, NP   1 mg at 03/21/23 0640   magnesium hydroxide (MILK OF MAGNESIA) suspension 30 mL  30 mL Oral Daily PRN Ardis Hughs, NP       OLANZapine zydis (ZYPREXA)  disintegrating tablet 10 mg  10 mg Oral Daily Lauro Franklin, MD   10 mg at 03/24/23 0829   OLANZapine zydis (ZYPREXA) disintegrating tablet 20 mg  20 mg Oral QHS Lauro Franklin, MD   20 mg at 03/23/23 2059   temazepam (RESTORIL) capsule 15 mg  15 mg Oral QHS Lauro Franklin, MD   15 mg at 03/23/23 2058   traZODone (DESYREL) tablet 300 mg  300 mg Oral QHS Armandina Stammer I, NP   300 mg at 03/23/23 2058   PTA Medications: Medications Prior to Admission  Medication Sig Dispense Refill Last Dose   INVEGA TRINZA 546 MG/1.75ML injection Inject 546 mg into the muscle every 3 (three) months. (Patient not taking: Reported on 03/09/2023)      lithium 300 MG tablet Take 300 mg by mouth daily. (Patient not taking: Reported on 03/09/2023)      OLANZapine (ZYPREXA) 20 MG tablet Take 1 tablet (20 mg total) by mouth at bedtime. For mood control (Patient not taking: Reported on 03/09/2023) 30 tablet 0    OLANZapine (ZYPREXA) 5 MG tablet Take 1 tablet (5 mg total) by mouth 2 (two) times daily.       Patient Stressors: Other: don't want  to be here.    Patient Strengths: Supportive family/friends   Treatment Modalities: Medication Management, Group therapy, Case management,  1 to 1 session with clinician, Psychoeducation, Recreational therapy.   Physician Treatment Plan for Primary Diagnosis: Schizoaffective disorder, bipolar type (HCC) Long Term Goal(s): Improvement in symptoms so as ready for discharge   Short Term Goals: Ability to identify and develop effective coping behaviors will improve Ability to maintain clinical measurements within normal limits will improve Compliance with prescribed medications will improve Ability to identify triggers associated with substance abuse/mental health issues will improve Ability to identify changes in lifestyle to reduce recurrence of condition will improve Ability to verbalize feelings will improve Ability to disclose and discuss suicidal  ideas Ability to demonstrate self-control will improve  Medication Management: Evaluate patient's response, side effects, and tolerance of medication regimen.  Therapeutic Interventions: 1 to 1 sessions, Unit Group sessions and Medication administration.  Evaluation of Outcomes: Progressing  Physician Treatment Plan for Secondary Diagnosis: Principal Problem:   Schizoaffective disorder, bipolar type (HCC) Active Problems:   Substance-induced psychotic disorder (HCC)  Long Term Goal(s): Improvement in symptoms so as ready for discharge   Short Term Goals: Ability to identify and develop effective coping behaviors will improve Ability to maintain clinical measurements within normal limits will improve Compliance with prescribed medications will improve Ability to identify triggers associated with substance abuse/mental health issues will improve Ability to identify changes in lifestyle to reduce recurrence of condition will improve Ability to verbalize feelings will improve Ability to disclose and discuss suicidal ideas Ability to demonstrate self-control will improve     Medication Management: Evaluate patient's response, side effects, and tolerance of medication regimen.  Therapeutic Interventions: 1 to 1 sessions, Unit Group sessions and Medication administration.  Evaluation of Outcomes: Progressing   RN Treatment Plan for Primary Diagnosis: Schizoaffective disorder, bipolar type (HCC) Long Term Goal(s): Knowledge of disease and therapeutic regimen to maintain health will improve  Short Term Goals: Ability to remain free from injury will improve, Ability to verbalize frustration and anger appropriately will improve, Ability to demonstrate self-control, Ability to participate in decision making will improve, Ability to verbalize feelings will improve, Ability to disclose and discuss suicidal ideas, Ability to identify and develop effective coping behaviors will improve, and  Compliance with prescribed medications will improve  Medication Management: RN will administer medications as ordered by provider, will assess and evaluate patient's response and provide education to patient for prescribed medication. RN will report any adverse and/or side effects to prescribing provider.  Therapeutic Interventions: 1 on 1 counseling sessions, Psychoeducation, Medication administration, Evaluate responses to treatment, Monitor vital signs and CBGs as ordered, Perform/monitor CIWA, COWS, AIMS and Fall Risk screenings as ordered, Perform wound care treatments as ordered.  Evaluation of Outcomes: Progressing   LCSW Treatment Plan for Primary Diagnosis: Schizoaffective disorder, bipolar type (HCC) Long Term Goal(s): Safe transition to appropriate next level of care at discharge, Engage patient in therapeutic group addressing interpersonal concerns.  Short Term Goals: Engage patient in aftercare planning with referrals and resources, Increase social support, Increase ability to appropriately verbalize feelings, Increase emotional regulation, Facilitate acceptance of mental health diagnosis and concerns, Facilitate patient progression through stages of change regarding substance use diagnoses and concerns, Identify triggers associated with mental health/substance abuse issues, and Increase skills for wellness and recovery  Therapeutic Interventions: Assess for all discharge needs, 1 to 1 time with Social worker, Explore available resources and support systems, Assess for adequacy in community support network, Educate  family and significant other(s) on suicide prevention, Complete Psychosocial Assessment, Interpersonal group therapy.  Evaluation of Outcomes: Progressing   Progress in Treatment: Attending groups: Yes. Participating in groups: Yes. Taking medication as prescribed: Yes. Toleration medication: Yes. Family/Significant other contact made: No, will contact:  Pt is  disorganized and cannot provide consent Patient understands diagnosis: No. Discussing patient identified problems/goals with staff: Yes. Medical problems stabilized or resolved: Yes. Denies suicidal/homicidal ideation: Yes. Issues/concerns per patient self-inventory: Yes. Other: N/A  New problem(s) identified: No, Describe:  none reported  New Short Term/Long Term Goal(s): medication stabilization, elimination of SI thoughts, development of comprehensive mental wellness plan.   Patient Goals:  None reported  Discharge Plan or Barriers: CSW will continue to follow and assess for appropriate referrals and possible discharge planning.   Reason for Continuation of Hospitalization: Delusions  Hallucinations Medication stabilization Withdrawal symptoms  Estimated Length of Stay: unknown  Last 3 [place] Suicide Severity Risk Score: Flowsheet Row Ad[facility] (Current[facility] Most recent reading at 03/09/2023  2:00 PM ED from 03/09/2023 in [facility] Most recent reading at 03/09/2023  9:44 AM ED from 01/13/2023 in [facility] Most recent reading at 01/13/2023  2:16 AM  C-SSRS RISK CATEGORY No Risk No Risk No Risk       Last PHQ 2/9 Scores:     No data to display        detox, medication management for mood stabilization; elimination of SI thoughts; development of comprehensive mental wellness/sobriety plan      Scribe for Treatment Team: Ane Payment, LCSW 03/24/2023 1:43 PM

## 2023-03-24 NOTE — Plan of Care (Signed)
  Problem: Education: Goal: Emotional status will improve Outcome: Progressing Goal: Mental status will improve Outcome: Progressing   Problem: Activity: Goal: Interest or engagement in activities will improve Outcome: Progressing Goal: Sleeping patterns will improve Outcome: Progressing

## 2023-03-24 NOTE — Progress Notes (Signed)
1:1 Nursing note:  Pt is laying in bed . Respirations are even and unlabored. No signs of distress. 1:1 continued for pt safety. Safety maintained.  

## 2023-03-25 NOTE — Progress Notes (Signed)
Nursing 1:1 note D:Pt observed pacing in his room. This writer sat with pt and pt was very calm and did not require medication for agitation.  RR even and unlabored. No distress noted. A: 1:1 observation continues for safety  R: pt remains safe

## 2023-03-25 NOTE — Progress Notes (Incomplete)
Pt heard from bedroom screaming. Approached bedroom, pt was yelling and cursing. Pt stated

## 2023-03-25 NOTE — Progress Notes (Addendum)
Pt agitated yelling and screaming, using racial slurs. Pt stating, "What the fuck is my wife clothes doing here?"  "I hate black people! I hate fucking females!"  Pt making nonsensical chanting, screaming at the top of his lungs at both male and male staff. Pt making threats and posturing at staff. Pt telling staff" Come on I will break your head!" Pt telling male staff to "Bring it on! I will break your fucking head!" Pt clenching fists at staff. Pt stating, "I hate the bitches! Pt exposed his penis to male staff, threw his trash can, using racial slur threw trash can all over room, ripped off poster on wall. Pt given agitation protocol. PRN Zyprexa and Benadryl IM given at 1350 in right deltoid. No manual hold required. Pt on 1:1 for safety. Pt remains safe on the unit.

## 2023-03-25 NOTE — [facility] Group Notes (Signed)
Pt was unable to attend wrap up group discussion

## 2023-03-25 NOTE — Group Note (Signed)
Recreation Therapy Group Note   Group Topic:Relaxation  Group Date: 03/25/2023 Start Time: 1005 End Time: 1050 Facilitators: Kobie Whidby-McCall, LRT,CTRS Location: 500 Hall Dayroom   Group Topic: Stress Management  Goal Area(s) Addresses:  Patient will identify positive stress management techniques. Patient will identify benefits of using stress management post d/c.  Group Description: Music Therapy. LRT gave patients the opportunity to request songs that relax them. Patients could request any songs of their choosing as long as the songs were clean and appropriate.   Education:  Stress Management, Discharge Planning.   Education Outcome: Acknowledges Education   Affect/Mood: Appropriate   Participation Level: Engaged   Participation Quality: Independent   Behavior: Appropriate   Speech/Thought Process: Relevant   Insight: Fair   Judgement: Fair    Modes of Intervention: Music   Patient Response to Interventions:  Engaged   Education Outcome:  In group clarification offered    Clinical Observations/Individualized Feedback: Pt was able to focus and be on task. Pt would rap and move along to the songs he picked. Pt was respectful of peers as they listened to their song choices as well. Pt needed very little redirection.     Plan: Continue to engage patient in RT group sessions 2-3x/week.   Rorey Hodges-McCall, LRT, CTRS 03/25/2023 10:57 AM

## 2023-03-25 NOTE — Progress Notes (Signed)
Nursing 1:1 note D:Pt observed sleeping in bed with eyes closed. RR even and unlabored. No distress noted. A: 1:1 observation continues for safety  R: pt remains safe  

## 2023-03-25 NOTE — Progress Notes (Signed)
Pt agitated yelling and screaming at staff after being told that his trash can would be emptied. Pt stated, "No fuck that shit!" Attempts at verbal redirections unsuccessful. Pt threatened and postured at staff. Pt loudly chanting nonsensical. Pt offered and given PO Hydroxyzine at 1659. No manual hold required. Pt on 1:1 for safety. Pt remains safe on the unit.

## 2023-03-25 NOTE — Progress Notes (Addendum)
After pt was given scheduled meds pt went back to his room. Writer asked pt to come back to med window to take scheduled haldol that had not been administered yet. Pt became agitated and started yelling and cursing at his sitter. Staff attempted to de- escalate pt. Pt went into the hallway and swung his fist at his Recruitment consultant. Pt walked to med window, took scheduled haldol, and continued to curse and yell at staff. Pt given agitation protocol. PRN Zyprexa and Benadryl given IM in right deltoid at 0945. No manual hold required. Pt on 1:1 for safety. Pt remains safe on the unit

## 2023-03-25 NOTE — Progress Notes (Signed)
Pt.finished taking his shower and then immediately began yelling and cursing in his room,ie."Niggers,crackers, niggers, crackers". This author attempted to deescalate him but was unsuccessful. His nurse came down to the room and he yelled and threatened her repeatedly. He refused to take his medicine and kept telling her not to get close to him. A second nurse came to the bedroom and once again he yelled and threatened that staff member as well. He uses a lot of profanity and racial slurs. The patient eventually took his medication by mouth for the second nurse. In addition, he was observed throwing his clothing off the bed and screaming while laying in his bed. He states that he is [address] of [place].

## 2023-03-25 NOTE — Progress Notes (Signed)
CSW called [facility] to inquire about pt's referral . CSW spoke with "Mr. Teena Dunk" from [facility] and was asked to provide progress notes in addition to the referral sent on 9-18. CSW faxed copies of progress notes from 03-11-2023 to present. CSW will continue to monitor.

## 2023-03-25 NOTE — Progress Notes (Signed)
1:1 Nursing note:  Pt is laying in bed sleeping . Respirations are even and unlabored. No signs of distress. 1:1 continued for pt safety. Safety maintained.

## 2023-03-25 NOTE — Progress Notes (Signed)
Pt up yelling, pt stated he wanted to go home, pt verbally de-escalated , pt informed that he needed to talk to the doctor about D/C

## 2023-03-25 NOTE — Progress Notes (Signed)
[facility] MD Progress Note  03/25/2023 1:03 PM [name] [name]  MRN:  [id number] Subjective:   [name] [name] is a 42 yr old male who presented on 9/3 to [facility] with delusions and paranoia, he was admitted to [facility] on 9/4.  PPHx is significant for Schizoaffective Disorder, Bipolar Type, Anxiety, Depression, and Polysubstance Abuse (Cocaine, Meth, EtOH, and an extensive history of aggression, and 6 Prior Psychiatric Hospitalizations (last [facility]- 10/2022), and no history of Suicide Attempts or Self Injurious Behavior. He does have an ACT Team- [facility].   Case was discussed in the multidisciplinary team. MAR was reviewed and patient was compliant with medications.  He received PRN Hydroxyzine yesterday.   Psychiatric Team made the following recommendations yesterday: -Continue Haldol 10 mg BID for psychosis and mood stability -Continue Zyprexa 10 mg AM and 20 mg QHS for psychosis and mood stability -Continue Depakote DR 750 mg BID for mood stability. -Continue Agitation Protocol: Zyprexa/Ativan, Zyprexa.Benadryl, and Benadryl -Continue 1:1 -Continue Restoril 15 mg QHS     On interview today patient reports he slept good last night.  He reports his appetite is doing good.  He reports no SI, HI, or AVH (per staff responding to internal stimuli).  He reports no Paranoia or Ideas of Reference.  He reports no issues with his medications.  He reports that the medicine he is taking is poisoning him.  He reports that his battery acid.  He then reports that the provider is overweight and put the cracks seen in the road.  He reports that the provider needs to lose weight because he is a Curator, and a nurse, and a doctor, a cop.  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  He reports that if he does not get released the students will come and break amount saying "they are going to shoot people."  He then began beating his chest saying "I know your father."  He then said the provider needs to smoke more.  He reports  other concerns are present.   Principal Problem: Schizoaffective disorder, bipolar type (HCC) Diagnosis: Principal Problem:   Schizoaffective disorder, bipolar type (HCC) Active Problems:   Substance-induced psychotic disorder (HCC)  Total Time spent with patient:  I personally spent 35 minutes on the unit in direct patient care. The direct patient care time included face-to-face time with the patient, reviewing the patient's chart, communicating with other professionals, and coordinating care. Greater than 50% of this time was spent in counseling or coordinating care with the patient regarding goals of hospitalization, psycho-education, and discharge planning needs.   Past Psychiatric History: Schizoaffective Disorder, Bipolar Type, Anxiety, Depression, and Polysubstance Abuse (Cocaine, Meth, EtOH, and an extensive history of aggression, and 6 Prior Psychiatric Hospitalizations (last [facility]- 10/2022), and no history of Suicide Attempts or Self Injurious Behavior.  He does have an ACT Team- [facility].  Past Medical History:  Past Medical History:  Diagnosis Date   Anxiety    Depression    Mental disorder    Schizophrenia (HCC)    History reviewed. No pertinent surgical history. Family History:  Family History  Problem Relation Age of Onset   Mental illness Neg Hx    Family Psychiatric  History: Reports None Social History:  Social History   Substance and Sexual Activity  Alcohol Use Not Currently   Comment: haven't drank in months"     Social History   Substance and Sexual Activity  Drug Use Yes   Types: Marijuana, Methamphetamines  Comment: Per sister, Pt uses meth    Social History   Socioeconomic History   Marital status: Married    Spouse name: Not on file   Number of children: 1   Years of education: Not on file   Highest education level: Not on file  Occupational History   Occupation: Disabled  Tobacco Use   Smoking status: Every Day    Current  packs/day: 1.00    Types: Cigarettes   Smokeless tobacco: Former  Building services engineer status: Unknown  Substance and Sexual Activity   Alcohol use: Not Currently    Comment: haven't drank in months"   Drug use: Yes    Types: Marijuana, Methamphetamines    Comment: 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 Pt lives with mother who IVC'd him.      Pt lives with parents and sister.  He is married, but currently separated.  Pt is on disability.   Social Determinants of Health   Financial Resource Strain: Not on file  Food Insecurity: Patient Unable To Answer (03/09/2023)   Hunger Vital Sign    Worried About Running Out of Food in the Last Year: Patient unable to answer    Ran Out of Food in the Last Year: Patient unable to answer  Transportation Needs: Patient Unable To Answer (03/09/2023)   PRAPARE - Administrator, Civil Service (Medical): Patient unable to answer    Lack of Transportation (Non-Medical): Patient unable to answer  Physical Activity: Not on file  Stress: Not on file  Social Connections: Not on file   Additional Social History:                         Sleep: Poor (per report slept 4.5 hrs, per patient good)  Appetite:  Good  Current Medications: Current Facility-Administered Medications  Medication Dose Route Frequency Provider Last Rate Last Admin   acetaminophen (TYLENOL) tablet 650 mg  650 mg Oral Q6H PRN Ardis Hughs, NP   650 mg at 03/22/23 2049   alum & mag hydroxide-simeth (MAALOX/MYLANTA) 200-200-20 MG/5ML suspension 30 mL  30 mL Oral Q4H PRN Ardis Hughs, NP       benztropine (COGENTIN) tablet 0.5 mg  0.5 mg Oral BID Lauro Franklin, MD   0.5 mg at 03/25/23 0830   diphenhydrAMINE (BENADRYL) capsule 50 mg  50 mg Oral TID PRN Armandina Stammer I, NP   50 mg at  03/21/23 0640   OLANZapine (ZYPREXA) injection 5 mg  5 mg Intramuscular TID PRN Bobbitt, Shalon E, NP   5 mg at 03/25/23 0846   And   diphenhydrAMINE (BENADRYL) injection 50 mg  50 mg Intramuscular TID PRN Bobbitt, Shalon E, NP   50 mg at 03/25/23 0845   divalproex (DEPAKOTE) DR tablet 750 mg  750 mg Oral BID Lauro Franklin, MD   750 mg at 03/25/23 0830   gabapentin (NEURONTIN) capsule 300 mg  300 mg Oral TID Armandina Stammer I, NP   300 mg at 03/25/23 0830   haloperidol (HALDOL) 2 MG/ML solution 10 mg  10 mg Oral BID Lauro Franklin, MD   10 mg at 03/25/23 8119   hydrOXYzine (ATARAX) tablet 50 mg  50 mg Oral Q4H PRN Armandina Stammer  I, NP   50 mg at 03/24/23 2054   OLANZapine (ZYPREXA) tablet 5 mg  5 mg Oral TID PRN Armandina Stammer I, NP   5 mg at 03/21/23 1478   And   LORazepam (ATIVAN) tablet 1 mg  1 mg Oral TID PRN Armandina Stammer I, NP   1 mg at 03/21/23 0640   magnesium hydroxide (MILK OF MAGNESIA) suspension 30 mL  30 mL Oral Daily PRN Ardis Hughs, NP       OLANZapine zydis (ZYPREXA) disintegrating tablet 10 mg  10 mg Oral Daily Lauro Franklin, MD   10 mg at 03/25/23 0832   OLANZapine zydis (ZYPREXA) disintegrating tablet 20 mg  20 mg Oral QHS Lauro Franklin, MD   20 mg at 03/24/23 2054   temazepam (RESTORIL) capsule 15 mg  15 mg Oral QHS Lauro Franklin, MD   15 mg at 03/24/23 2053   traZODone (DESYREL) tablet 300 mg  300 mg Oral QHS Armandina Stammer I, NP   300 mg at 03/24/23 2053    Lab Results:  No results found for this or any previous visit (from the past 48 hour(s)).   Blood Alcohol level:  Lab Results  Component Value Date   ETH <10 03/09/2023   ETH <10 10/05/2022    Metabolic Disorder Labs: Lab Results  Component Value Date   HGBA1C 5.7 (H) 10/07/2022   MPG 117 10/07/2022   MPG 114 10/05/2022   Lab Results  Component Value Date   PROLACTIN 17.4 10/05/2022   PROLACTIN 45.8 (H) 12/19/2017   Lab Results  Component Value Date   CHOL 156  10/05/2022   TRIG 116 10/05/2022   HDL 40 (L) 10/05/2022   CHOLHDL 3.9 10/05/2022   VLDL 23 10/05/2022   LDLCALC 93 10/05/2022   LDLCALC 69 11/21/2020    Physical Findings: AIMS:  , ,  ,  ,    CIWA:    COWS:     Musculoskeletal: Strength & Muscle Tone: within normal limits Gait & Station: normal Patient leans: N/A  Psychiatric Specialty Exam:  Presentation  General Appearance:  Casual  Eye Contact: Fair (Staring)  Speech: Clear and Coherent; Normal Rate  Speech Volume: Increased  Handedness: Right   Mood and Affect  Mood: -- ("ok")  Affect: Labile; Restricted   Thought Process  Thought Processes: Disorganized  Descriptions of Associations:Loose  Orientation:Full (Time, Place and Person)  Thought Content:Delusions; Paranoid Ideation  History of Schizophrenia/Schizoaffective disorder:Yes  Duration of Psychotic Symptoms:Greater than six months  Hallucinations:Hallucinations: Other (comment) (per staff responding to internal stimuli)   Ideas of Reference:Delusions  Suicidal Thoughts:Suicidal Thoughts: No   Homicidal Thoughts:Homicidal Thoughts: No    Sensorium  Memory: Immediate Fair  Judgment: Impaired  Insight: Lacking   Executive Functions  Concentration: Fair  Attention Span: Fair  Recall: Fair  Fund of Knowledge: Fair  Language: Fair   Psychomotor Activity  Psychomotor Activity:Psychomotor Activity: Normal    Assets  Assets: Financial Resources/Insurance; Resilience; Physical Health   Sleep  Sleep:Sleep: Good Number of Hours of Sleep: 7.25     Physical Exam: Physical Exam Constitutional:      General: He is not in acute distress.    Appearance: Normal appearance. He is normal weight. He is not ill-appearing or toxic-appearing.  HENT:     Head: Normocephalic and atraumatic.  Pulmonary:     Effort: Pulmonary effort is normal.  Musculoskeletal:        General: Normal range of motion.  Neurological:     General: No focal deficit present.     Mental Status: He is alert.    Review of Systems  Respiratory:  Negative for cough and shortness of breath.   Cardiovascular:  Negative for chest pain.  Gastrointestinal:  Negative for abdominal pain, constipation, diarrhea, nausea and vomiting.  Neurological:  Negative for dizziness, weakness and headaches.  Psychiatric/Behavioral:  Positive for hallucinations (reports none, per staff responding to internal stimuli). Negative for depression and suicidal ideas. The patient is not nervous/anxious.    Blood pressure 119/82, pulse 78, temperature 98.2 F (36.8 C), temperature source Oral, resp. rate 20, height 5\' 3"  (1.6 m), weight 70 kg, SpO2 98%. Body mass index is 27.35 kg/m.   Treatment Plan Summary: Daily contact with patient to assess and evaluate symptoms and progress in treatment and Medication management  [name] [name] is a 42 yr old male who presented on 9/3 to [facility] with delusions and paranoia, he was admitted to [facility] on 9/4.  PPHx is significant for Schizoaffective Disorder, Bipolar Type, Anxiety, Depression, and Polysubstance Abuse (Cocaine, Meth, EtOH, and an extensive history of aggression, and 6 Prior Psychiatric Hospitalizations (last [facility]- 10/2022), and no history of Suicide Attempts or Self Injurious Behavior. He does have an ACT Team- [facility].    Artis continues to be disorganized, paranoid, and labile, quickly moving from calm to screaming.  We will continue with the 1:1 monitoring.  We continue to update [facility] as it does appear he will need sustained care to return to baseline.  We will continue to monitor.   Schizoaffective Disorder, Bipolar Type: -Continue Haldol 10 mg BID for psychosis and mood stability -Continue Zyprexa 10 mg AM and 20 mg QHS for psychosis and mood stability -Continue Depakote DR 750 mg BID for mood stability. -Continue Agitation Protocol: Zyprexa/Ativan, Zyprexa.Benadryl, and  Benadryl -Continue 1:1 -New Referral sent to [facility] yesterday   -Continue Restoril 15 mg QHS for insomnia -Continue Gabapentin 300 mg TID  -Continue Trazodone 300 mg QHS for sleep -Continue PRN's: Tylenol, Maalox, Atarax, Milk of Magnesia   Lauro Franklin, MD 03/25/2023, 1:03 PM

## 2023-03-25 NOTE — Progress Notes (Addendum)
Heard pt screaming from bedroom.  Approached bedroom, pt was yelling and cursing. Pt stated "don't get fucking near me. I will kill you. I will break your fucking head"  Pt continued to make threatening statements and became increasingly louder. Pt was yelling racial slurs. Pt was waving his fists in the air towards the direction of Clinical research associate. Offered bedtime medications to pt and pt stated "that shit makes my mouth bleed, look at this shit." Pt then began to spit on the ground. Observed spit and no blood was present. Pt proceeded to shout out like a chicken nonstop. Another nurse on the unit came for assistance and pt was verbally aggressive to nurse as well. Pt threw objects from bed onto the floor and yelled "momma papa." Pt was able to take oral medications with the assistance of the other nurse.

## 2023-03-26 DIAGNOSIS — F250 Schizoaffective disorder, bipolar type: Secondary | ICD-10-CM | POA: Diagnosis not present

## 2023-03-26 MED ORDER — DIVALPROEX SODIUM 500 MG PO DR TAB
1000.0000 mg | DELAYED_RELEASE_TABLET | Freq: Two times a day (BID) | ORAL | Status: DC
  Administered 2023-03-26 – 2023-03-31 (×10): 1000 mg via ORAL
  Filled 2023-03-26 (×13): qty 2

## 2023-03-26 MED ORDER — HALOPERIDOL LACTATE 2 MG/ML PO CONC
10.0000 mg | Freq: Three times a day (TID) | ORAL | Status: DC
  Administered 2023-03-26 – 2023-04-09 (×43): 10 mg via ORAL
  Filled 2023-03-26 (×48): qty 5

## 2023-03-26 MED ORDER — BENZTROPINE MESYLATE 1 MG PO TABS
1.0000 mg | ORAL_TABLET | Freq: Two times a day (BID) | ORAL | Status: DC
  Administered 2023-03-26 – 2023-04-09 (×28): 1 mg via ORAL
  Filled 2023-03-26 (×32): qty 1

## 2023-03-26 NOTE — Plan of Care (Signed)
Problem: Education: Goal: Emotional status will improve Outcome: Not Progressing Goal: Mental status will improve Outcome: Not Progressing   Problem: Coping: Goal: Ability to verbalize frustrations and anger appropriately will improve Outcome: Not Progressing

## 2023-03-26 NOTE — Progress Notes (Signed)
Pt awake in room, lying in bed singing. Remains restless, fidgety with intermittent pacing, multiple changes of clothes with loud singing. Safety maintained on 1:1 observation without incident to note thus far. Assigned staff in attendance at all times.

## 2023-03-26 NOTE — Progress Notes (Signed)
Nursing 1:1 note D:Pt observed sleeping in bed with eyes closed. RR even and unlabored. No distress noted. A: 1:1 observation continues for safety  R: pt remains safe

## 2023-03-26 NOTE — Progress Notes (Signed)
   03/26/23 2236  Psych Admission Type (Psych Patients Only)  Admission Status Involuntary  Psychosocial Assessment  Patient Complaints Anxiety  Eye Contact Fair  Facial Expression Animated  Affect Appropriate to circumstance  Speech Tangential  Interaction Needy;Poor  Motor Activity Fidgety;Restless  Appearance/Hygiene Layered clothes;Poor hygiene  Behavior Characteristics Cooperative  Mood Labile;Anxious;Preoccupied  Aggressive Behavior  Effect No apparent injury  Thought Process  Coherency Disorganized  Content Delusions;Preoccupation  Delusions Grandeur  Perception Derealization;Hallucinations  Hallucination Auditory  Judgment Poor  Confusion None  Danger to Self  Current suicidal ideation? Denies  Danger to Others  Danger to Others None reported or observed

## 2023-03-26 NOTE — Progress Notes (Signed)
Pt up and appears a little agitated, pt given PRN agitation protocol per [facility], pt took it without incident

## 2023-03-26 NOTE — Progress Notes (Signed)
   03/26/23 0558  15 Minute Checks  Location Bedroom  Visual Appearance Calm  Behavior Sleeping  Sleep (Behavioral Health Patients Only)  Calculate sleep? (Click Yes once per 24 hr at 0600 safety check) Yes  Documented sleep last 24 hours 10.5

## 2023-03-26 NOTE — Final Progress Note (Signed)
Pt asleep in bed, respirations noted and unlabored. Tolerated lunch, fluids and medications well. 1:1 observation maintained without incident to note at this time.

## 2023-03-26 NOTE — Progress Notes (Signed)
1:1 Nursing note:  Pt is laying in bed sleeping . Respirations are even and unlabored. No signs of distress. 1:1 continued for pt safety. Safety maintained.

## 2023-03-26 NOTE — Progress Notes (Signed)
Pt A & O to to self and place. Denies SI, HI, VH and pain when assessed. However, he remains disorganized with incoherent speech with flight of ideas at intervals this shift. Endorses +AH "I'm fine, it's no problem right now". Demanding d/c home "I have to go home. I've in jail for too long. I need to serve my community. I love women, I love you guys and I'm not going to hurt you". Reports he slept well last night with good appetite. Received agitation protocol 0300 as he was agitatated, restless, unable to sleep, pacing hall per nursing report. Observed to be restless, fidgety but is cooperative with care and redirectable at this time. Compliant with medications when offered; denies adverse drug reactions when assessed. Safety maintained on 1:1 observation without incident to note thus far. Assigned staff in attendance at all times. Support, encouragement and reassurance offered.

## 2023-03-26 NOTE — Plan of Care (Signed)
Problem: Health Behavior/Discharge Planning: Goal: Compliance with treatment plan for underlying cause of condition will improve Outcome: Progressing   Problem: Physical Regulation: Goal: Ability to maintain clinical measurements within normal limits will improve Outcome: Progressing   Problem: Safety: Goal: Periods of time without injury will increase Outcome: Progressing

## 2023-03-26 NOTE — Group Note (Signed)
Date:  03/26/2023 Time:  8:26 PM  Group Topic/Focus:  Wrap-Up Group:   The focus of this group is to help patients review their daily goal of treatment and discuss progress on daily workbooks.    Participation Level:  Did Not Attend  Participation Quality:   N/A  Affect:   N/A  Cognitive:   N/A  Insight: None  Engagement in Group:  None  Modes of Intervention:   N/A  Additional Comments:  Patient did not attend group.   Kennieth Francois 03/26/2023, 8:26 PM

## 2023-03-26 NOTE — Group Note (Signed)
Recreation Therapy Group Note   Group Topic:Personal Development  Group Date: 03/26/2023 Start Time: 1055 End Time: 1115 Facilitators: Jasman Pfeifle-McCall, LRT,CTRS Location: [address] Dayroom   Group Topic: Personal Development  Goal Area(s) Addresses:  Patient will successfully identify the benefits of gratitude. Patient will successfully identify things they are grateful for.    Intervention: Worksheet  Group Description: Patients were given a worksheet that was titled "Gratitude Log". The sheet was divided into four boxes. In the first box, patients were to identify five things they are grateful for. In the second box, patients had to identify three people that made their life happier. In the third box, patients were to identify challenges and what they learned from them. Lastly, patients were to identify their fondest memories.  Education: Geophysicist/field seismologist, Discharge Planning   Affect/Mood: Drowsy   Participation Level: Minimal   Participation Quality: Independent   Behavior: Cooperative   Speech/Thought Process: Incoherent   Insight: Lacking   Judgement: Lacking    Modes of Intervention: Worksheet   Patient Response to Interventions:  Receptive   Education Outcome:  In group clarification offered    Clinical Observations/Individualized Feedback: Pt was appropriate during group session. Pt was drowsy but cooperative during group session. Pt hand writing was hard to understand but was legible at points. Pt was grateful for "good health, clothes and shoes". Pt identified greatest memory as "marrying [employer]" . LRT was unable to understand the rest of pt writing.     Plan: Continue to engage patient in RT group sessions 2-3x/week.   Denver Harder-McCall, LRT,CTRS 03/26/2023 12:54 PM

## 2023-03-26 NOTE — Progress Notes (Signed)
[facility] MD Progress Note  03/26/2023 12:41 PM [name] [name]  MRN:  [id number] Subjective:   [name] [name] is a 42 yr old male who presented on 9/3 to [facility] with delusions and paranoia, he was admitted to [facility] on 9/4.  PPHx is significant for Schizoaffective Disorder, Bipolar Type, Anxiety, Depression, and Polysubstance Abuse (Cocaine, Meth, EtOH, and an extensive history of aggression, and 6 Prior Psychiatric Hospitalizations (last [facility]- 10/2022), and no history of Suicide Attempts or Self Injurious Behavior. He does have an ACT Team- [facility].   Case was discussed in the multidisciplinary team. MAR was reviewed and patient was compliant with medications.  He received PRN Hydroxyzine yesterday. 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  He does agree to comply with medications but later talks about medications "killing my body it is a poison" indicating paranoia and poor insight. He reports fair sleep last night and fair appetite.  Patient referral to [facility] was completed, will follow.   Principal Problem: Schizoaffective disorder, bipolar type (HCC) Diagnosis: Principal Problem:   Schizoaffective disorder, bipolar type (HCC) Active Problems:   Substance-induced psychotic disorder (HCC)  Total Time spent with patient:  I personally  spent 35 minutes on the unit in direct patient care. The direct patient care time included face-to-face time with the patient, reviewing the patient's chart, communicating with other professionals, and coordinating care. Greater than 50% of this time was spent in counseling or coordinating care with the patient regarding goals of hospitalization, psycho-education, and discharge planning needs.   Past Psychiatric History: Schizoaffective Disorder, Bipolar Type, Anxiety, Depression, and Polysubstance Abuse (Cocaine, Meth, EtOH, and an extensive history of aggression, and 6 Prior Psychiatric Hospitalizations (last [facility]- 10/2022), and no history of Suicide Attempts or Self Injurious Behavior.  He does have an ACT Team- [facility].  Past Medical History:  Past Medical History:  Diagnosis Date   Anxiety    Depression    Mental disorder    Schizophrenia (HCC)    History reviewed. No pertinent surgical history. Family History:  Family History  Problem Relation Age of Onset   Mental illness Neg Hx    Family Psychiatric  History: Reports None 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, Pt uses meth    Social History   Socioeconomic History   Marital status: Married    Spouse name: Not on file   Number of children: 1   Years of education: Not on file   Highest education level: Not on file  Occupational History   Occupation: Disabled  Tobacco Use   Smoking status: Every Day    Current packs/day: 1.00    Types:  Cigarettes   Smokeless tobacco: Former  Building services engineer status: Unknown  Substance and Sexual Activity   Alcohol use: Not Currently    Comment: haven't drank in months"   Drug use: Yes    Types: Marijuana, Methamphetamines    Comment: Per sister, Pt uses  meth   Sexual activity: Yes    Birth control/protection: None  Other Topics Concern   Not on file  Social History Narrative   Pt refusing to answer any questions during assessment. Pt lives with mother who IVC'd him.      Pt lives with parents and sister.  He is married, but currently separated.  Pt is on disability.   Social Determinants of Health   Financial Resource Strain: Not on file  Food Insecurity: Patient Unable To Answer (03/09/2023)   Hunger Vital Sign    Worried About Running Out of Food in the Last Year: Patient unable to answer    Ran Out of Food in the Last Year: Patient unable to answer  Transportation Needs: Patient Unable To Answer (03/09/2023)   PRAPARE - Administrator, Civil Service (Medical): Patient unable to answer    Lack of Transportation (Non-Medical): Patient unable to answer  Physical Activity: Not on file  Stress: Not on file  Social Connections: Not on file   Additional Social History:                         Sleep: Poor (per report slept 4.5 hrs, per patient good)  Appetite:  Good  Current Medications: Current Facility-Administered Medications  Medication Dose Route Frequency Provider Last Rate Last Admin   acetaminophen (TYLENOL) tablet 650 mg  650 mg Oral Q6H PRN Ardis Hughs, NP   650 mg at 03/22/23 2049   alum & mag hydroxide-simeth (MAALOX/MYLANTA) 200-200-20 MG/5ML suspension 30 mL  30 mL Oral Q4H PRN Ardis Hughs, NP       benztropine (COGENTIN) tablet 0.5 mg  0.5 mg Oral BID Lauro Franklin, MD   0.5 mg at 03/26/23 0839   diphenhydrAMINE (BENADRYL) capsule 50 mg  50 mg Oral TID PRN Armandina Stammer I, NP   50 mg at 03/26/23 0311   OLANZapine (ZYPREXA) injection 5 mg  5 mg Intramuscular TID PRN Bobbitt, Shalon E, NP   5 mg at 03/25/23 1350   And   diphenhydrAMINE (BENADRYL) injection 50 mg  50 mg Intramuscular TID PRN Bobbitt, Shalon E, NP   50 mg at 03/25/23 1350   divalproex (DEPAKOTE) DR tablet 750 mg   750 mg Oral BID Lauro Franklin, MD   750 mg at 03/26/23 1610   gabapentin (NEURONTIN) capsule 300 mg  300 mg Oral TID Armandina Stammer I, NP   300 mg at 03/26/23 0839   haloperidol (HALDOL) 2 MG/ML solution 10 mg  10 mg Oral BID Lauro Franklin, MD   10 mg at 03/26/23 0840   hydrOXYzine (ATARAX) tablet 50 mg  50 mg Oral Q4H PRN Armandina Stammer I, NP   50 mg at 03/25/23 1659   OLANZapine (ZYPREXA) tablet 5 mg  5 mg Oral TID PRN Armandina Stammer I, NP   5 mg at 03/26/23 9604   And   LORazepam (ATIVAN) tablet 1 mg  1 mg Oral TID PRN Armandina Stammer I, NP   1 mg at 03/26/23 0311   magnesium hydroxide (MILK OF MAGNESIA) suspension 30 mL  30 mL Oral Daily PRN  Ardis Hughs, NP       OLANZapine zydis (ZYPREXA) disintegrating tablet 10 mg  10 mg Oral Daily Lauro Franklin, MD   10 mg at 03/26/23 0840   OLANZapine zydis (ZYPREXA) disintegrating tablet 20 mg  20 mg Oral QHS Lauro Franklin, MD   20 mg at 03/25/23 2007   temazepam (RESTORIL) capsule 15 mg  15 mg Oral QHS Lauro Franklin, MD   15 mg at 03/25/23 2006   traZODone (DESYREL) tablet 300 mg  300 mg Oral QHS Armandina Stammer I, NP   300 mg at 03/25/23 2006    Lab Results:  No results found for this or any previous visit (from the past 48 hour(s)).   Blood Alcohol level:  Lab Results  Component Value Date   ETH <10 03/09/2023   ETH <10 10/05/2022    Metabolic Disorder Labs: Lab Results  Component Value Date   HGBA1C 5.7 (H) 10/07/2022   MPG 117 10/07/2022   MPG 114 10/05/2022   Lab Results  Component Value Date   PROLACTIN 17.4 10/05/2022   PROLACTIN 45.8 (H) 12/19/2017   Lab Results  Component Value Date   CHOL 156 10/05/2022   TRIG 116 10/05/2022   HDL 40 (L) 10/05/2022   CHOLHDL 3.9 10/05/2022   VLDL 23 10/05/2022   LDLCALC 93 10/05/2022   LDLCALC 69 11/21/2020    Physical Findings: AIMS:  , ,  ,  ,    CIWA:    COWS:     Musculoskeletal: Strength & Muscle Tone: within normal limits Gait &  Station: normal Patient leans: N/A  Psychiatric Specialty Exam:  Presentation  General Appearance:  Casual  Eye Contact: Fair (Staring)  Speech: Clear and Coherent; Normal Rate  Speech Volume: Increased  Handedness: Right   Mood and Affect  Mood: -- ("ok")  Affect: Labile; Restricted   Thought Process  Thought Processes: Disorganized  Descriptions of Associations:Loose  Orientation:Full (Time, Place and Person)  Thought Content:Delusions; Paranoid Ideation  History of Schizophrenia/Schizoaffective disorder:Yes  Duration of Psychotic Symptoms:Greater than six months  Hallucinations:Hallucinations: Other (comment) (per staff responding to internal stimuli)   Ideas of Reference:Delusions  Suicidal Thoughts:Suicidal Thoughts: No   Homicidal Thoughts:Homicidal Thoughts: No    Sensorium  Memory: Immediate Fair  Judgment: Impaired  Insight: Lacking   Executive Functions  Concentration: Fair  Attention Span: Fair  Recall: Fair  Fund of Knowledge: Fair  Language: Fair   Psychomotor Activity  Psychomotor Activity:Psychomotor Activity: Normal    Assets  Assets: Financial Resources/Insurance; Resilience; Physical Health   Sleep  Sleep:Sleep: Good Number of Hours of Sleep: 7.25     Physical Exam: Physical Exam Vitals and nursing note reviewed.  Constitutional:      General: He is not in acute distress.    Appearance: Normal appearance. He is normal weight. He is not ill-appearing or toxic-appearing.  HENT:     Head: Normocephalic and atraumatic.  Pulmonary:     Effort: Pulmonary effort is normal.  Musculoskeletal:        General: Normal range of motion.  Neurological:     General: No focal deficit present.     Mental Status: He is alert.    Review of Systems  Respiratory:  Negative for cough and shortness of breath.   Cardiovascular:  Negative for chest pain.  Gastrointestinal:  Negative for abdominal pain,  constipation, diarrhea, nausea and vomiting.  Neurological:  Negative for dizziness, weakness and headaches.  Psychiatric/Behavioral:  Positive for  hallucinations (reports none, per staff responding to internal stimuli). Negative for depression and suicidal ideas. The patient is not nervous/anxious.    Blood pressure 119/82, pulse 78, temperature 98.2 F (36.8 C), temperature source Oral, resp. rate 20, height 5\' 3"  (1.6 m), weight 70 kg, SpO2 98%. Body mass index is 27.35 kg/m.   Treatment Plan Summary: Daily contact with patient to assess and evaluate symptoms and progress in treatment and Medication management  Xyaire Goldner is a 42 yr old male who presented on 9/3 to [facility] with delusions and paranoia, he was admitted to [facility] on 9/4.  PPHx is significant for Schizoaffective Disorder, Bipolar Type, Anxiety, Depression, and Polysubstance Abuse (Cocaine, Meth, EtOH, and an extensive history of aggression, and 6 Prior Psychiatric Hospitalizations (last [facility]- 10/2022), and no history of Suicide Attempts or Self Injurious Behavior. He does have an ACT Team- [facility].    Danney continues to be disorganized, paranoid, and labile, quickly moving from calm to screaming.  We will continue with the 1:1 monitoring.  We continue to update [facility] as it does appear he will need sustained care to return to baseline.  We will continue to monitor.   Schizoaffective Disorder, Bipolar Type: -Titrate Haldol from 10 mg twice daily to 10 mg 3 times daily for psychosis and mood stability Consider Thorazine if Haldol is not effective. EKG last completed 9/16 QTc 460, will repeat EKG 9/21 and follow. -Continue Zyprexa 10 mg AM and 20 mg QHS for psychosis and mood stability -Titrate Depakote from 750 mg to 1000 mg twice daily for mood stability. Will check Depakote level, CMP and CBC on 9/25 to ensure therapeutic level and no toxicity.  Titrate Cogentin from 0.5 mg to 1 mg twice daily for EPS prophylaxis -Continue  Agitation Protocol: Zyprexa/Ativan, Zyprexa.Benadryl, and Benadryl -Continue 1:1 -Referral completed to [facility], will follow.   -Continue Restoril 15 mg QHS for insomnia -Continue Gabapentin 300 mg TID  -Continue Trazodone 300 mg QHS for sleep -Continue PRN's: Tylenol, Maalox, Atarax, Milk of Magnesia   Daiana Vitiello Abbott Pao, MD 03/26/2023, 12:41 PM

## 2023-03-27 DIAGNOSIS — F250 Schizoaffective disorder, bipolar type: Secondary | ICD-10-CM | POA: Diagnosis not present

## 2023-03-27 NOTE — Progress Notes (Signed)
[facility] MD Progress Note  03/27/2023 10:05 AM Ramont Kincheloe  MRN:  [id number] Subjective:   Luisa Dasari is a 42 yr old male who presented on 9/3 to [facility] with delusions and paranoia, he was admitted to [facility] on 9/4.  PPHx is significant for Schizoaffective Disorder, Bipolar Type, Anxiety, Depression, and Polysubstance Abuse (Cocaine, Meth, EtOH, and an extensive history of aggression, and 6 Prior Psychiatric Hospitalizations (last [facility]- 10/2022), and no history of Suicide Attempts or Self Injurious Behavior. He does have an ACT Team- [facility].   Case was discussed in the multidisciplinary team. MAR was reviewed and patient was compliant with medications.  He received agitation protocol 9/20 at 8 PM, staff reported to be Calmer this morning but continues to be disorganized  On interview today patient presents primarily linear able to answer questions but easily gets on a disorganized tangential he is alert and oriented to day month and year, oriented to place, able to tell me current president and previous president then he notes "I would be a president you can also be a president I need to be a pope first" he does admit to having related hallucinations but describes them as good voices telling him his family okay he also describes seeing dragons "there is a glimpse of Eagle over there I see it but you do not" he denies paranoia he continues to be disorganized and delusional telling me that he used to work for [employer] in 2001 "only I and [employer] know about it" he denies side effect to current medications and does not appear sleepy or sedated, does not have any signs consistent with EPS or TD during evaluation. He denies SI or HI  Patient's referral to [facility] in process, will follow.   Principal Problem: Schizoaffective disorder, bipolar type (HCC) Diagnosis: Principal Problem:   Schizoaffective disorder, bipolar type (HCC) Active Problems:   Substance-induced psychotic disorder (HCC)  Total Time spent with  patient:  I personally spent 35 minutes on the unit in direct patient care. The direct patient care time included face-to-face time with the patient, reviewing the patient's chart, communicating with other professionals, and coordinating care. Greater than 50% of this time was spent in counseling or coordinating care with the patient regarding goals of hospitalization, psycho-education, and discharge planning needs.   Past Psychiatric History: Schizoaffective Disorder, Bipolar Type, Anxiety, Depression, and Polysubstance Abuse (Cocaine, Meth, EtOH, and an extensive history of aggression, and 6 Prior Psychiatric Hospitalizations (last [facility]- 10/2022), and no history of Suicide Attempts or Self Injurious Behavior.  He does have an ACT Team- [facility].  Past Medical History:  Past Medical History:  Diagnosis Date   Anxiety    Depression    Mental disorder    Schizophrenia (HCC)    History reviewed. No pertinent surgical history. Family History:  Family History  Problem Relation Age of Onset   Mental illness Neg Hx    Family Psychiatric  History: Reports None 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, Pt uses meth    Social History   Socioeconomic History   Marital status: Married    Spouse name: Not on file   Number of children: 1   Years of education: Not on file   Highest education  level: Not on file  Occupational History   Occupation: Disabled  Tobacco Use   Smoking status: Every Day    Current packs/day: 1.00    Types: Cigarettes   Smokeless tobacco: Former  Building services engineer status: Unknown  Substance and Sexual Activity   Alcohol use: Not Currently    Comment: haven't drank in months"   Drug use: Yes    Types: Marijuana, Methamphetamines     Comment: 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 Pt lives with mother who IVC'd him.      Pt lives with parents and sister.  He is married, but currently separated.  Pt is on disability.   Social Determinants of Health   Financial Resource Strain: Not on file  Food Insecurity: Patient Unable To Answer (03/09/2023)   Hunger Vital Sign    Worried About Running Out of Food in the Last Year: Patient unable to answer    Ran Out of Food in the Last Year: Patient unable to answer  Transportation Needs: Patient Unable To Answer (03/09/2023)   PRAPARE - Administrator, Civil Service (Medical): Patient unable to answer    Lack of Transportation (Non-Medical): Patient unable to answer  Physical Activity: Not on file  Stress: Not on file  Social Connections: Not on file   Additional Social History:                         Sleep: Poor (per report slept 4.5 hrs, per patient good)  Appetite:  Good  Current Medications: Current Facility-Administered Medications  Medication Dose Route Frequency Provider Last Rate Last Admin   acetaminophen (TYLENOL) tablet 650 mg  650 mg Oral Q6H PRN Ardis Hughs, NP   650 mg at 03/22/23 2049   alum & mag hydroxide-simeth (MAALOX/MYLANTA) 200-200-20 MG/5ML suspension 30 mL  30 mL Oral Q4H PRN Ardis Hughs, NP       benztropine (COGENTIN) tablet 1 mg  1 mg Oral BID Abbott Pao, Marquisa Salih, MD   1 mg at 03/27/23 0735   diphenhydrAMINE (BENADRYL) capsule 50 mg  50 mg Oral TID PRN Armandina Stammer I, NP   50 mg at 03/26/23 0311   OLANZapine (ZYPREXA) injection 5 mg  5 mg Intramuscular TID PRN Bobbitt, Shalon E, NP   5 mg at 03/25/23 1350   And   diphenhydrAMINE (BENADRYL) injection 50 mg  50 mg Intramuscular TID PRN Bobbitt, Shalon E, NP   50 mg at 03/25/23 1350   divalproex (DEPAKOTE) DR  tablet 1,000 mg  1,000 mg Oral BID Abbott Pao, Yaslin Kirtley, MD   1,000 mg at 03/27/23 0735   gabapentin (NEURONTIN) capsule 300 mg  300 mg Oral TID Armandina Stammer I, NP   300 mg at 03/27/23 0735   haloperidol (HALDOL) 2 MG/ML solution 10 mg  10 mg Oral TID Sarita Bottom, MD   10 mg at 03/27/23 0737   hydrOXYzine (ATARAX) tablet 50 mg  50 mg Oral Q4H PRN Armandina Stammer I, NP   50 mg at 03/26/23 2010   OLANZapine (ZYPREXA) tablet 5 mg  5 mg Oral TID PRN Armandina Stammer I, NP   5 mg at 03/26/23 1610   And   LORazepam (ATIVAN) tablet 1 mg  1 mg Oral TID PRN  Armandina Stammer I, NP   1 mg at 03/26/23 4259   magnesium hydroxide (MILK OF MAGNESIA) suspension 30 mL  30 mL Oral Daily PRN Ardis Hughs, NP       OLANZapine zydis (ZYPREXA) disintegrating tablet 10 mg  10 mg Oral Daily Lauro Franklin, MD   10 mg at 03/27/23 0736   OLANZapine zydis (ZYPREXA) disintegrating tablet 20 mg  20 mg Oral QHS Lauro Franklin, MD   20 mg at 03/26/23 2008   temazepam (RESTORIL) capsule 15 mg  15 mg Oral QHS Lauro Franklin, MD   15 mg at 03/26/23 2006   traZODone (DESYREL) tablet 300 mg  300 mg Oral QHS Armandina Stammer I, NP   300 mg at 03/26/23 2006    Lab Results:  No results found for this or any previous visit (from the past 48 hour(s)).   Blood Alcohol level:  Lab Results  Component Value Date   ETH <10 03/09/2023   ETH <10 10/05/2022    Metabolic Disorder Labs: Lab Results  Component Value Date   HGBA1C 5.7 (H) 10/07/2022   MPG 117 10/07/2022   MPG 114 10/05/2022   Lab Results  Component Value Date   PROLACTIN 17.4 10/05/2022   PROLACTIN 45.8 (H) 12/19/2017   Lab Results  Component Value Date   CHOL 156 10/05/2022   TRIG 116 10/05/2022   HDL 40 (L) 10/05/2022   CHOLHDL 3.9 10/05/2022   VLDL 23 10/05/2022   LDLCALC 93 10/05/2022   LDLCALC 69 11/21/2020    Physical Findings: AIMS: Facial and Oral Movements Muscles of Facial Expression: None, normal Lips and Perioral Area: None,  normal Jaw: None, normal Tongue: None, normal,Extremity Movements Upper (arms, wrists, hands, fingers): None, normal Lower (legs, knees, ankles, toes): None, normal, Trunk Movements Neck, shoulders, hips: None, normal, Overall Severity Severity of abnormal movements (highest score from questions above): None, normal Incapacitation due to abnormal movements: None, normal Patient's awareness of abnormal movements (rate only patient's report): No Awareness, Dental Status Current problems with teeth and/or dentures?: No Does patient usually wear dentures?: No  CIWA:    COWS:     Musculoskeletal: Strength & Muscle Tone: within normal limits Gait & Station: normal Patient leans: N/A  Psychiatric Specialty Exam:  Presentation  General Appearance:  Casual  Eye Contact: Fair (Staring)  Speech: Clear and Coherent; Normal Rate  Speech Volume: Increased  Handedness: Right   Mood and Affect  Mood: -- ("ok")  Affect: Labile; Restricted   Thought Process  Thought Processes: Disorganized  Descriptions of Associations:Loose  Orientation:Full (Time, Place and Person)  Thought Content:Delusions; Paranoid Ideation  History of Schizophrenia/Schizoaffective disorder:Yes  Duration of Psychotic Symptoms:Greater than six months  Hallucinations:No data recorded   Ideas of Reference:Delusions  Suicidal Thoughts:No data recorded   Homicidal Thoughts:No data recorded    Sensorium  Memory: Immediate Fair  Judgment: Impaired  Insight: Lacking   Executive Functions  Concentration: Fair  Attention Span: Fair  Recall: [employer] of Knowledge: Fair  Language: Fair   Psychomotor Activity  Psychomotor Activity:No data recorded    Assets  Assets: Financial Resources/Insurance; Resilience; Physical Health   Sleep  Sleep:No data recorded     Physical Exam: Physical Exam Vitals and nursing note reviewed.  Constitutional:      General: He  is not in acute distress.    Appearance: Normal appearance. He is normal weight. He is not ill-appearing or toxic-appearing.  HENT:     Head: Normocephalic  and atraumatic.  Pulmonary:     Effort: Pulmonary effort is normal.  Musculoskeletal:        General: Normal range of motion.  Neurological:     General: No focal deficit present.     Mental Status: He is alert.    Review of Systems  Respiratory:  Negative for cough and shortness of breath.   Cardiovascular:  Negative for chest pain.  Gastrointestinal:  Negative for abdominal pain, constipation, diarrhea, nausea and vomiting.  Neurological:  Negative for dizziness, weakness and headaches.  Psychiatric/Behavioral:  Positive for hallucinations (reports none, per staff responding to internal stimuli). Negative for depression and suicidal ideas. The patient is not nervous/anxious.    Blood pressure 125/75, pulse 88, temperature 97.9 F (36.6 C), temperature source Oral, resp. rate 20, height 5\' 3"  (1.6 m), weight 70 kg, SpO2 98%. Body mass index is 27.35 kg/m.   Treatment Plan Summary: Daily contact with patient to assess and evaluate symptoms and progress in treatment and Medication management  Quayvon Gildersleeve is a 42 yr old male who presented on 9/3 to [facility] with delusions and paranoia, he was admitted to [facility] on 9/4.  PPHx is significant for Schizoaffective Disorder, Bipolar Type, Anxiety, Depression, and Polysubstance Abuse (Cocaine, Meth, EtOH, and an extensive history of aggression, and 6 Prior Psychiatric Hospitalizations (last [facility]- 10/2022), and no history of Suicide Attempts or Self Injurious Behavior. He does have an ACT Team- [facility].    Raynard continues to be disorganized, paranoid, and labile, quickly moving from calm to screaming.  We will continue with the 1:1 monitoring.  We continue to update [facility] as it does appear he will need sustained care to return to baseline.  We will continue to monitor.   Schizoaffective  Disorder, Bipolar Type: -Continue Haldol 10 mg 3 times daily, dose was adjusted 9/20 for psychosis and mood stability Consider Thorazine if Haldol is not effective. EKG last completed 9/16 QTc 460, will repeat EKG 9/21 and follow. -Continue Zyprexa 10 mg AM and 20 mg QHS for psychosis and mood stability -Continue Depakote 1000 mg twice daily, was adjusted 9/20 for mood stability. Will check Depakote level, CMP and CBC on 9/25 to ensure therapeutic level and no toxicity.  Continue Cogentin 1 mg twice daily for EPS prophylaxis, dose was adjusted 9/20 -Continue Agitation Protocol: Zyprexa/Ativan, Zyprexa.Benadryl, and Benadryl -Continue 1:1 -Referral completed to [facility], will follow.   -Continue Restoril 15 mg QHS for insomnia -Continue Gabapentin 300 mg TID  -Continue Trazodone 300 mg QHS for sleep -Continue PRN's: Tylenol, Maalox, Atarax, Milk of Magnesia   Rikayla Demmon, MD 03/27/2023, 10:06 AM

## 2023-03-27 NOTE — Progress Notes (Signed)
Pt awake, pacing in hall singing and talking to self at times. Tolerated lunch well. Engaged in scheduled activities without outburst. 1:1 observation maintained without sexual inappropriate behavior to note thus far. Safety maintained.

## 2023-03-27 NOTE — Plan of Care (Signed)
Problem: Education: Goal: Emotional status will improve Outcome: Progressing   Problem: Safety: Goal: Periods of time without injury will increase Outcome: Progressing

## 2023-03-27 NOTE — Progress Notes (Signed)
Adult Psychoeducational Group Note  Date:  03/27/2023 Time:  8:19 PM  Group Topic/Focus:  Wrap-Up Group:   The focus of this group is to help patients review their daily goal of treatment and discuss progress on daily workbooks.  Participation Level:  Active  Participation Quality:  Appropriate  Affect:  Appropriate  Cognitive:  Appropriate  Insight: Appropriate  Engagement in Group:  Engaged  Modes of Intervention:  Discussion  Additional Comments:  Pt stated he had a good day.  Pt stated his goal for the day was to talk to doctor. Pt met goal.  Lucilla Lame 03/27/2023, 8:19 PM

## 2023-03-27 NOTE — Plan of Care (Signed)
Problem: Education: Goal: Knowledge of [facility] Education information/materials will improve Outcome: Not Progressing Goal: Emotional status will improve Outcome: Not Progressing Goal: Mental status will improve Outcome: Not Progressing Goal: Verbalization of understanding the information provided will improve Outcome: Not Progressing   Problem: Activity: Goal: Interest or engagement in activities will improve Outcome: Not Progressing Goal: Sleeping patterns will improve Outcome: Not Progressing

## 2023-03-27 NOTE — Progress Notes (Signed)
Pt visible in hall / dayroom on initial contact. A & O to self, day, month and place. Denies SI, HI, VH and pain when assessed. Continues to endorse +AH "I still hear voices, they talk to me everyday, all through my body". Presents animated, hyperactive, restless but disorganized, hyper-religious, delusional and grandeur in nature. Per pt "Y'all going [place] come back down with some weapons. I will bring back back weapons". Remain medications compliant without adverse drug reactions. Tolerated breakfast and fluids well. 1:1 observation maintained without sexual inappropriate behavior to note thus far.

## 2023-03-27 NOTE — Progress Notes (Signed)
Pt awake in hall. Tolerated supper and fluids well. Animated, restless at intervals but is cooperative with care and verbally redirectable at this time. 1:1 observation maintained without sexual inappropriate behavior to note thus far.

## 2023-03-28 DIAGNOSIS — F250 Schizoaffective disorder, bipolar type: Secondary | ICD-10-CM | POA: Diagnosis not present

## 2023-03-28 MED ORDER — NICOTINE 14 MG/24HR TD PT24
14.0000 mg | MEDICATED_PATCH | Freq: Every day | TRANSDERMAL | Status: DC
  Administered 2023-03-28 – 2023-04-09 (×4): 14 mg via TRANSDERMAL
  Filled 2023-03-28 (×16): qty 1

## 2023-03-28 NOTE — Plan of Care (Signed)
Problem: Education: Goal: Knowledge of [facility] Education information/materials will improve Outcome: Progressing   Problem: Activity: Goal: Interest or engagement in activities will improve Outcome: Progressing   Problem: Coping: Goal: Ability to verbalize frustrations and anger appropriately will improve Outcome: Progressing   Problem: Physical Regulation: Goal: Ability to maintain clinical measurements within normal limits will improve Outcome: Progressing
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
## 2023-03-28 NOTE — Progress Notes (Signed)
1:1 Note,  Pt observed sleeping with eyes closed, respirations are even and unlabored, not unwanted behavior noted, remains on 1:1  for safety, will continue to monitor.

## 2023-03-28 NOTE — [facility] Group Notes (Signed)
Adult Psychoeducational Group Note  Date:  03/28/2023 Time:  8:25 PM  Group Topic/Focus:  Wrap-Up Group:   The focus of this group is to help patients review their daily goal of treatment and discuss progress on daily workbooks.  Participation Level:  Active  Participation Quality:  Redirectable  Affect:  Appropriate  Cognitive:  Appropriate  Insight: Good  Engagement in Group:  Distracting and Off Topic  Modes of Intervention:  Discussion  Additional Comments:   Pt states that he had a good day and was able to call his family. Pt spoke with his doctor about d/c and he states that he may go home tomorrow. Pt states he feels ready and is hopeful that he quits smoking nicotine. Pt denied everything  Vevelyn Pat 03/28/2023, 8:25 PM

## 2023-03-28 NOTE — Progress Notes (Signed)
[facility] MD Progress Note  03/28/2023 11:11 AM [name] [name]  MRN:  [id number] Subjective:   [name] [name] is a 42 yr old male who presented on 9/3 to [facility] with delusions and paranoia, he was admitted to [facility] on 9/4.  PPHx is significant for Schizoaffective Disorder, Bipolar Type, Anxiety, Depression, and Polysubstance Abuse (Cocaine, Meth, EtOH, and an extensive history of aggression, and 6 Prior Psychiatric Hospitalizations (last [facility]- 10/2022), and no history of Suicide Attempts or Self Injurious Behavior. He does have an ACT Team- [facility].   Case was discussed in the multidisciplinary team. MAR was reviewed and patient was compliant with medications.  Per [facility] he last received Zyprexa IM as needed for agitation was 9/19, last received Zyprexa p.o. as needed for agitation was 9/20 in addition to Ativan and Benadryl.  Atarax as needed for anxiety being used once daily with last use 9/21.  No as needed agitation protocol needed or given on 9/21 indicating some level of improvement being more cooperative and redirectable.  Patient has been compliant with scheduled Haldol 3 times daily Zyprexa twice daily and scheduled Depakote twice daily in addition to Cogentin twice daily.  Also compliant with scheduled trazodone and Restoril with reported good sleep.  On interview today patient presents primarily linear and concrete telling me he had a good day yesterday and this morning he tells me he takes his medication and denies any side effects with them then he easily gets tangential telling me "I need to exercise" he does not present agitated or irritable, he continues to report auditory hallucinations but describes them as "good voices telling me good things" he continues to report visual hallucinations but unable to describe what he sees this morning and when discussed with him what he reported as visual hallucination yesterday seeing dragons and eagle he responds "let me see let me look know I am not seeing that  today" he does not present sedated or sleepy, does not present with any sign consistent with EPS or TD.  He denies passive or active SI intention or plan, denies HI.  He continues to be delusional and disorganized when talking about more broad subjects but he seems to be easier to redirect and having more lucid moments, will follow.     Patient's referral to [facility] in process, will follow.   Principal Problem: Schizoaffective disorder, bipolar type (HCC) Diagnosis: Principal Problem:   Schizoaffective disorder, bipolar type (HCC) Active Problems:   Substance-induced psychotic disorder (HCC)  Total Time spent with patient:  I personally spent 35 minutes on the unit in direct patient care. The direct patient care time included face-to-face time with the patient, reviewing the patient's chart, communicating with other professionals, and coordinating care. Greater than 50% of this time was spent in counseling or coordinating care with the patient regarding goals of hospitalization, psycho-education, and discharge planning needs.   Past Psychiatric History: Schizoaffective Disorder, Bipolar Type, Anxiety, Depression, and Polysubstance Abuse (Cocaine, Meth, EtOH, and an extensive history of aggression, and 6 Prior Psychiatric Hospitalizations (last [facility]- 10/2022), and no history of Suicide Attempts or Self Injurious Behavior.  He does have an ACT Team- [facility].  Past Medical History:  Past Medical History:  Diagnosis Date   Anxiety    Depression    Mental disorder    Schizophrenia (HCC)    History reviewed. No pertinent surgical history. Family History:  Family History  Problem Relation Age of Onset   Mental illness Neg Hx  Family Psychiatric  History: Reports None Social History:  Social History   Substance and Sexual Activity  Alcohol Use Not Currently   Comment: haven't drank in months"     Social History   Substance and Sexual Activity  Drug Use Yes   Types: Marijuana,  Methamphetamines   Comment: Per sister, Pt uses meth    Social History   Socioeconomic History   Marital status: Married    Spouse name: Not on file   Number of children: 1   Years of education: Not on file   Highest education level: Not on file  Occupational History   Occupation: Disabled  Tobacco Use   Smoking status: Every Day    Current packs/day: 1.00    Types: Cigarettes   Smokeless tobacco: Former  Building services engineer status: Unknown  Substance and Sexual Activity   Alcohol use: Not Currently    Comment: haven't drank in months"   Drug use: Yes    Types: Marijuana, Methamphetamines    Comment: 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 Pt lives with mother who IVC'd him.      Pt lives with parents and sister.  He is married, but currently separated.  Pt is on disability.   Social Determinants of Health   Financial Resource Strain: Not on file  Food Insecurity: Patient Unable To Answer (03/09/2023)   Hunger Vital Sign    Worried About Running Out of Food in the Last Year: Patient unable to answer    Ran Out of Food in the Last Year: Patient unable to answer  Transportation Needs: Patient Unable To Answer (03/09/2023)   PRAPARE - Administrator, Civil Service (Medical): Patient unable to answer    Lack of Transportation (Non-Medical): Patient unable to answer  Physical Activity: Not on file  Stress: Not on file  Social Connections: Not on file   Additional Social History:                         Sleep: Poor (per report slept 4.5 hrs, per patient good)  Appetite:  Good  Current Medications: Current Facility-Administered Medications  Medication Dose Route Frequency Provider Last Rate Last Admin   acetaminophen (TYLENOL) tablet 650 mg  650 mg Oral Q6H PRN Ardis Hughs, NP   650  mg at 03/22/23 2049   alum & mag hydroxide-simeth (MAALOX/MYLANTA) 200-200-20 MG/5ML suspension 30 mL  30 mL Oral Q4H PRN Ardis Hughs, NP       benztropine (COGENTIN) tablet 1 mg  1 mg Oral BID Abbott Pao, Oleva Koo, MD   1 mg at 03/28/23 0734   diphenhydrAMINE (BENADRYL) capsule 50 mg  50 mg Oral TID PRN Armandina Stammer I, NP   50 mg at 03/26/23 0311   OLANZapine (ZYPREXA) injection 5 mg  5 mg Intramuscular TID PRN Bobbitt, Shalon E, NP   5 mg at 03/25/23 1350   And   diphenhydrAMINE (BENADRYL) injection 50 mg  50 mg Intramuscular TID PRN Bobbitt, Shalon E, NP   50 mg at 03/25/23 1350   divalproex (DEPAKOTE) DR tablet 1,000 mg  1,000 mg Oral BID Abbott Pao, Pennie Vanblarcom, MD   1,000 mg at 03/28/23 0735   gabapentin (NEURONTIN) capsule 300 mg  300 mg Oral TID Nwoko,  Nicole Kindred I, NP   300 mg at 03/28/23 0734   haloperidol (HALDOL) 2 MG/ML solution 10 mg  10 mg Oral TID Sarita Bottom, MD   10 mg at 03/28/23 0733   hydrOXYzine (ATARAX) tablet 50 mg  50 mg Oral Q4H PRN Armandina Stammer I, NP   50 mg at 03/27/23 2033   OLANZapine (ZYPREXA) tablet 5 mg  5 mg Oral TID PRN Armandina Stammer I, NP   5 mg at 03/26/23 2952   And   LORazepam (ATIVAN) tablet 1 mg  1 mg Oral TID PRN Armandina Stammer I, NP   1 mg at 03/26/23 8413   magnesium hydroxide (MILK OF MAGNESIA) suspension 30 mL  30 mL Oral Daily PRN Ardis Hughs, NP       nicotine (NICODERM CQ - dosed in mg/24 hours) patch 14 mg  14 mg Transdermal Daily Nkwenti, Doris, NP       OLANZapine zydis (ZYPREXA) disintegrating tablet 10 mg  10 mg Oral Daily Lauro Franklin, MD   10 mg at 03/28/23 0734   OLANZapine zydis (ZYPREXA) disintegrating tablet 20 mg  20 mg Oral QHS Lauro Franklin, MD   20 mg at 03/27/23 2033   temazepam (RESTORIL) capsule 15 mg  15 mg Oral QHS Lauro Franklin, MD   15 mg at 03/27/23 2034   traZODone (DESYREL) tablet 300 mg  300 mg Oral QHS Armandina Stammer I, NP   300 mg at 03/27/23 2033    Lab Results:  No results found for this or any  previous visit (from the past 48 hour(s)).   Blood Alcohol level:  Lab Results  Component Value Date   ETH <10 03/09/2023   ETH <10 10/05/2022    Metabolic Disorder Labs: Lab Results  Component Value Date   HGBA1C 5.7 (H) 10/07/2022   MPG 117 10/07/2022   MPG 114 10/05/2022   Lab Results  Component Value Date   PROLACTIN 17.4 10/05/2022   PROLACTIN 45.8 (H) 12/19/2017   Lab Results  Component Value Date   CHOL 156 10/05/2022   TRIG 116 10/05/2022   HDL 40 (L) 10/05/2022   CHOLHDL 3.9 10/05/2022   VLDL 23 10/05/2022   LDLCALC 93 10/05/2022   LDLCALC 69 11/21/2020    Physical Findings: AIMS: Facial and Oral Movements Muscles of Facial Expression: None, normal Lips and Perioral Area: None, normal Jaw: None, normal Tongue: None, normal,Extremity Movements Upper (arms, wrists, hands, fingers): None, normal Lower (legs, knees, ankles, toes): None, normal, Trunk Movements Neck, shoulders, hips: None, normal, Overall Severity Severity of abnormal movements (highest score from questions above): None, normal Incapacitation due to abnormal movements: None, normal Patient's awareness of abnormal movements (rate only patient's report): No Awareness, Dental Status Current problems with teeth and/or dentures?: No Does patient usually wear dentures?: No  CIWA:    COWS:     Musculoskeletal: Strength & Muscle Tone: within normal limits Gait & Station: normal Patient leans: N/A  Psychiatric Specialty Exam:  Presentation  General Appearance:  Casual  Eye Contact: Fair (Staring)  Speech: Clear and Coherent; Normal Rate  Speech Volume: Increased  Handedness: Right   Mood and Affect  Mood: -- ("ok")  Affect: Labile; Restricted   Thought Process  Thought Processes: Disorganized  Descriptions of Associations:Loose  Orientation:Full (Time, Place and Person)  Thought Content:Delusions; Paranoid Ideation  History of Schizophrenia/Schizoaffective  disorder:Yes  Duration of Psychotic Symptoms:Greater than six months  Hallucinations:No data recorded   Ideas of Reference:Delusions  Suicidal  Thoughts:No data recorded   Homicidal Thoughts:No data recorded    Sensorium  Memory: Immediate Fair  Judgment: Impaired  Insight: Lacking   Executive Functions  Concentration: Fair  Attention Span: Fair  Recall: [employer] of Knowledge: Fair  Language: Fair   Psychomotor Activity  Psychomotor Activity:No data recorded    Assets  Assets: Financial Resources/Insurance; Resilience; Physical Health   Sleep  Sleep:No data recorded     Physical Exam: Physical Exam Vitals and nursing note reviewed.  Constitutional:      General: He is not in acute distress.    Appearance: Normal appearance. He is normal weight. He is not ill-appearing or toxic-appearing.  HENT:     Head: Normocephalic and atraumatic.  Pulmonary:     Effort: Pulmonary effort is normal.  Musculoskeletal:        General: Normal range of motion.  Neurological:     General: No focal deficit present.     Mental Status: He is alert.    Review of Systems  Respiratory:  Negative for cough and shortness of breath.   Cardiovascular:  Negative for chest pain.  Gastrointestinal:  Negative for abdominal pain, constipation, diarrhea, nausea and vomiting.  Neurological:  Negative for dizziness, weakness and headaches.  Psychiatric/Behavioral:  Positive for hallucinations (reports none, per staff responding to internal stimuli). Negative for depression and suicidal ideas. The patient is not nervous/anxious.    Blood pressure 128/72, pulse 75, temperature (!) 97.5 F (36.4 C), temperature source Oral, resp. rate 18, height 5\' 3"  (1.6 m), weight 70 kg, SpO2 98%. Body mass index is 27.35 kg/m.   Treatment Plan Summary: Daily contact with patient to assess and evaluate symptoms and progress in treatment and Medication management  Basel Rehkop is a  42 yr old male who presented on 9/3 to [facility] with delusions and paranoia, he was admitted to [facility] on 9/4.  PPHx is significant for Schizoaffective Disorder, Bipolar Type, Anxiety, Depression, and Polysubstance Abuse (Cocaine, Meth, EtOH, and an extensive history of aggression, and 6 Prior Psychiatric Hospitalizations (last [facility]- 10/2022), and no history of Suicide Attempts or Self Injurious Behavior. He does have an ACT Team- [facility].    Kyler continues to be disorganized, paranoid, and labile, quickly moving from calm to screaming.  We will continue with the 1:1 monitoring.  We continue to update [facility] as it does appear he will need sustained care to return to baseline.  We will continue to monitor.   Schizoaffective Disorder, Bipolar Type: -Continue Haldol 10 mg 3 times daily, dose was adjusted 9/20 for psychosis and mood stability Consider Thorazine if Haldol is not effective. EKG last completed 9/16 QTc 460, will repeat EKG 9/21 and follow. -Continue Zyprexa 10 mg AM and 20 mg QHS for psychosis and mood stability -Continue Depakote 1000 mg twice daily, was adjusted 9/20 for mood stability. Will check Depakote level, CMP and CBC on 9/25 to ensure therapeutic level and no toxicity.  Continue Cogentin 1 mg twice daily for EPS prophylaxis, dose was adjusted 9/20 -Continue Agitation Protocol: Zyprexa/Ativan, Zyprexa.Benadryl, and Benadryl -Continue 1:1 -Referral completed to [facility], will follow.   -Continue Restoril 15 mg QHS for insomnia -Continue Gabapentin 300 mg TID  -Continue Trazodone 300 mg QHS for sleep -Continue PRN's: Tylenol, Maalox, Atarax, Milk of Magnesia   Serenah Mill, MD 03/28/2023, 11:11 AM

## 2023-03-28 NOTE — Progress Notes (Signed)
1:1 Note Pt observed at the beginning of the shift in the dayroom watching TV with pears. Pt has been calm and cooperative with care, pt took all his medication as scheduled, no issues noted. Pt reported good day, good appetite. Remains on 1:1 for safety, will continue to monitor.

## 2023-03-28 NOTE — [facility] Group Notes (Addendum)
[facility] Group Notes:  (Nursing/MHT/Case Management/Adjunct)  Date:  03/28/2023  Time:  2:28 PM  Type of Therapy:  Psychoeducational Skills  Participation Level:  Active  Participation Quality:  Appropriate  Affect:  Not Congruent  Cognitive:  Disorganized  Insight:  Lacking  Engagement in Group:  Lacking and Limited  Modes of Intervention:  Discussion, Education, and Exploration  Summary of Progress/Problems:  Mental health wellness podcast played from [facility], regarding tips for promoting mental well being and healthy relationships. Pt attended but thought content shared was largely disorganized.   Malva Limes 03/28/2023, 2:28 PM

## 2023-03-28 NOTE — Progress Notes (Signed)
Patient ID: [name] [name], male   DOB: 1981/06/26, 42 y.o.   MRN: [id number] Pt remains on 1.1. for safety due to impulsive behaviors. Pt is restless at times and does require redirection verbally for hyperactive/hypersexual statements but pt is safe and easily redirected at this time. Will con't to monitor 1.1.

## 2023-03-28 NOTE — Progress Notes (Signed)
Nursing 1:1 Note   D: Pt is laying in bedroom. Respirations noted even and unlabored. Pt was hyperactive on unit but was redirectable. Pt compliant with medications.  A: Water and snack offered. Emotional support and availability provided. Minimizing stimuli. Offered rest.  Maintaining 1: 1 for safety.  R: Pt remains resting in bedroom quietly. Receptive to care interventions at this time. Remains safe on 1:1.

## 2023-03-28 NOTE — Plan of Care (Signed)

## 2023-03-29 ENCOUNTER — Encounter (HOSPITAL_COMMUNITY): Payer: Self-pay

## 2023-03-29 DIAGNOSIS — F250 Schizoaffective disorder, bipolar type: Secondary | ICD-10-CM | POA: Diagnosis not present

## 2023-03-29 NOTE — Plan of Care (Signed)
?  Problem: Education: ?Goal: Knowledge of [facility] Education information/materials will improve ?Outcome: Not Progressing ?Goal: Emotional status will improve ?Outcome: Not Progressing ?Goal: Mental status will improve ?Outcome: Not Progressing ?Goal: Verbalization of understanding the information provided will improve ?Outcome: Not Progressing ?  ?Problem: Activity: ?Goal: Interest or engagement in activities will improve ?Outcome: Not Progressing ?Goal: Sleeping patterns will improve ?Outcome: Not Progressing ?  ?Problem: Coping: ?Goal: Ability to verbalize frustrations and anger appropriately will improve ?Outcome: Not Progressing ?Goal: Ability to demonstrate self-control will improve ?Outcome: Not Progressing ?  ?Problem: Health Behavior/Discharge Planning: ?Goal: Identification of resources available to assist in meeting health care needs will improve ?Outcome: Not Progressing ?Goal: Compliance with treatment plan for underlying cause of condition will improve ?Outcome: Not Progressing ?  ?Problem: Physical Regulation: ?Goal: Ability to maintain clinical measurements within normal limits will improve ?Outcome: Not Progressing ?  ?Problem: Safety: ?Goal: Periods of time without injury will increase ?Outcome: Not Progressing ?  ?

## 2023-03-29 NOTE — Group Note (Signed)
LCSW Group Therapy Note   Group Date: 03/29/2023 Start Time: 1300 End Time: 1400   Type of Therapy and Topic:  Group Therapy: Challenging Core Beliefs  Participation Level:  Did Not Attend  Description of Group:  Patients were educated about core beliefs and asked to identify one harmful core belief that they have. Patients were asked to explore from where those beliefs originate. Patients were asked to discuss how those beliefs make them feel and the resulting behaviors of those beliefs. They were then be asked if those beliefs are true and, if so, what evidence they have to support them. Lastly, group members were challenged to replace those negative core beliefs with helpful beliefs.   Therapeutic Goals:   1. Patient will identify harmful core beliefs and explore the origins of such beliefs. 2. Patient will identify feelings and behaviors that result from those core beliefs. 3. Patient will discuss whether such beliefs are true. 4.  Patient will replace harmful core beliefs with helpful ones.  Summary of Patient Progress:  Did Not Attend    Therapeutic Modalities: Cognitive Behavioral Therapy; Solution-Focused Therapy   [name] [name] 03/29/2023  3:28 PM

## 2023-03-29 NOTE — Progress Notes (Signed)
1:1 Note: Patient maintained on constant supervision for safety.  Medications given as prescribed.  Patient observed pacing the hallway talking and chanting to himself. Observed wearing male clothing and refused to take it off after several attempts.  Patient visible in milieu interacting with peers.  Patient is safe on the unit with supervision

## 2023-03-29 NOTE — Progress Notes (Signed)
1:1 Note:  Patient maintained on constant supervision for safety.  Patient denies suicidal thoughts and visual hallucination but is observed talking to himself in the hallway.  Continues to need redirection on the unit.

## 2023-03-29 NOTE — Group Note (Signed)
Recreation Therapy Group Note   Group Topic:Healthy Decision Making  Group Date: 03/29/2023 Start Time: 1010 End Time: 1045 Facilitators: Swayzie Choate-McCall, LRT,CTRS Location: [address] Dayroom   Group Topic: Decision Making, Problem Solving, Communication  Goal Area(s) Addresses:  Patient will effectively work with peer towards shared goal.  Patient will identify factors that guided their decision making.  Patient will pro-socially communicate ideas during group session.   Intervention: Survival Scenario - pencil, paper  Group Description: Patients were given a scenario that they were going to be stranded on a deserted [place] for several months before being rescued. Writer tasked them with making a list of 15 things they would choose to bring with them for "survival". The list of items was prioritized most important to least. Each patient would come up with their own list, then work together to create a new list of 15 items while in a group of 3-5 peers. LRT discussed each person's list and how it differed from others. The debrief included discussion of priorities, good decisions versus bad decisions, and how it is important to think before acting so we can make the best decision possible. LRT tied the concept of effective communication among group members to patient's support systems outside of the hospital and its benefit post discharge.  Education: Pharmacist, community, Priorities, Support System, Discharge Planning   Education Outcome: Acknowledges education/In group clarification/Needs additional education   Affect/Mood: Appropriate   Participation Level: Moderate   Participation Quality: Independent   Behavior: Cooperative   Speech/Thought Process: Relevant   Insight: Fair   Judgement: Fair    Modes of Intervention: Writing   Patient Response to Interventions:  Receptive   Education Outcome:  In group clarification offered    Clinical Observations/Individualized  Feedback: Pt was quiet eating his snack at the beginning of group session. When pt finished, pt expressed he would take his wife, clothes, shoes, pets, lighter, cigarettes, liquor and all natural bug repellant with him on the [place]. Pt was appropriate during group session. Pt was eventually called out of group to meet with the doctor but later returned near the conclusion of group.    Plan: Continue to engage patient in RT group sessions 2-3x/week.   Morgin Halls-McCall, LRT,CTRS  03/29/2023 12:41 PM

## 2023-03-29 NOTE — Progress Notes (Signed)
1:1 Note: Patient maintained on constant supervision for safety.  Patient visible in milieu interacting with peers.  Routine safety checks continues.  Medications given as prescribed.  Patient is safe on the unit.

## 2023-03-29 NOTE — BH IP Treatment Plan (Signed)
Interdisciplinary Treatment and Diagnostic Plan Update  03/29/2023 Time of Session: 9:15am (UPDATE) [name] [name] MRN: [id number]  Principal Diagnosis: Schizoaffective disorder, bipolar type (HCC)  Secondary Diagnoses: Principal Problem:   Schizoaffective disorder, bipolar type (HCC) Active Problems:   Substance-induced psychotic disorder (HCC)   Current Medications:  Current Facility-Administered Medications  Medication Dose Route Frequency Provider Last Rate Last Admin   acetaminophen (TYLENOL) tablet 650 mg  650 mg Oral Q6H PRN Ardis Hughs, NP   650 mg at 03/22/23 2049   alum & mag hydroxide-simeth (MAALOX/MYLANTA) 200-200-20 MG/5ML suspension 30 mL  30 mL Oral Q4H PRN Ardis Hughs, NP       benztropine (COGENTIN) tablet 1 mg  1 mg Oral BID Abbott Pao, Nadir, MD   1 mg at 03/29/23 0815   diphenhydrAMINE (BENADRYL) capsule 50 mg  50 mg Oral TID PRN Armandina Stammer I, NP   50 mg at 03/26/23 0311   OLANZapine (ZYPREXA) injection 5 mg  5 mg Intramuscular TID PRN Bobbitt, Shalon E, NP   5 mg at 03/25/23 1350   And   diphenhydrAMINE (BENADRYL) injection 50 mg  50 mg Intramuscular TID PRN Bobbitt, Shalon E, NP   50 mg at 03/25/23 1350   divalproex (DEPAKOTE) DR tablet 1,000 mg  1,000 mg Oral BID Abbott Pao, Nadir, MD   1,000 mg at 03/29/23 0815   gabapentin (NEURONTIN) capsule 300 mg  300 mg Oral TID Armandina Stammer I, NP   300 mg at 03/29/23 0814   haloperidol (HALDOL) 2 MG/ML solution 10 mg  10 mg Oral TID Sarita Bottom, MD   10 mg at 03/29/23 0815   hydrOXYzine (ATARAX) tablet 50 mg  50 mg Oral Q4H PRN Armandina Stammer I, NP   50 mg at 03/28/23 2021   OLANZapine (ZYPREXA) tablet 5 mg  5 mg Oral TID PRN Armandina Stammer I, NP   5 mg at 03/26/23 8657   And   LORazepam (ATIVAN) tablet 1 mg  1 mg Oral TID PRN Armandina Stammer I, NP   1 mg at 03/26/23 0311   magnesium hydroxide (MILK OF MAGNESIA) suspension 30 mL  30 mL Oral Daily PRN Ardis Hughs, NP       nicotine (NICODERM CQ - dosed in mg/24  hours) patch 14 mg  14 mg Transdermal Daily Nkwenti, Tyler Aas, NP   14 mg at 03/29/23 0817   OLANZapine zydis (ZYPREXA) disintegrating tablet 10 mg  10 mg Oral Daily Lauro Franklin, MD   10 mg at 03/29/23 0815   OLANZapine zydis (ZYPREXA) disintegrating tablet 20 mg  20 mg Oral QHS Lauro Franklin, MD   20 mg at 03/28/23 2024   temazepam (RESTORIL) capsule 15 mg  15 mg Oral QHS Lauro Franklin, MD   15 mg at 03/28/23 2021   traZODone (DESYREL) tablet 300 mg  300 mg Oral QHS Armandina Stammer I, NP   300 mg at 03/28/23 2021   PTA Medications: Medications Prior to Admission  Medication Sig Dispense Refill Last Dose   INVEGA TRINZA 546 MG/1.75ML injection Inject 546 mg into the muscle every 3 (three) months. (Patient not taking: Reported on 03/09/2023)      lithium 300 MG tablet Take 300 mg by mouth daily. (Patient not taking: Reported on 03/09/2023)      OLANZapine (ZYPREXA) 20 MG tablet Take 1 tablet (20 mg total) by mouth at bedtime. For mood control (Patient not taking: Reported on 03/09/2023) 30 tablet 0    OLANZapine (ZYPREXA)  5 MG tablet Take 1 tablet (5 mg total) by mouth 2 (two) times daily.       Patient Stressors: Other: don't want to be here.    Patient Strengths: Supportive family/friends   Treatment Modalities: Medication Management, Group therapy, Case management,  1 to 1 session with clinician, Psychoeducation, Recreational therapy.   Physician Treatment Plan for Primary Diagnosis: Schizoaffective disorder, bipolar type (HCC) Long Term Goal(s): Improvement in symptoms so as ready for discharge   Short Term Goals: Ability to identify and develop effective coping behaviors will improve Ability to maintain clinical measurements within normal limits will improve Compliance with prescribed medications will improve Ability to identify triggers associated with substance abuse/mental health issues will improve Ability to identify changes in lifestyle to reduce recurrence of  condition will improve Ability to verbalize feelings will improve Ability to disclose and discuss suicidal ideas Ability to demonstrate self-control will improve  Medication Management: Evaluate patient's response, side effects, and tolerance of medication regimen.  Therapeutic Interventions: 1 to 1 sessions, Unit Group sessions and Medication administration.  Evaluation of Outcomes: Progressing  Physician Treatment Plan for Secondary Diagnosis: Principal Problem:   Schizoaffective disorder, bipolar type (HCC) Active Problems:   Substance-induced psychotic disorder (HCC)  Long Term Goal(s): Improvement in symptoms so as ready for discharge   Short Term Goals: Ability to identify and develop effective coping behaviors will improve Ability to maintain clinical measurements within normal limits will improve Compliance with prescribed medications will improve Ability to identify triggers associated with substance abuse/mental health issues will improve Ability to identify changes in lifestyle to reduce recurrence of condition will improve Ability to verbalize feelings will improve Ability to disclose and discuss suicidal ideas Ability to demonstrate self-control will improve     Medication Management: Evaluate patient's response, side effects, and tolerance of medication regimen.  Therapeutic Interventions: 1 to 1 sessions, Unit Group sessions and Medication administration.  Evaluation of Outcomes: Progressing   RN Treatment Plan for Primary Diagnosis: Schizoaffective disorder, bipolar type (HCC) Long Term Goal(s): Knowledge of disease and therapeutic regimen to maintain health will improve  Short Term Goals: Ability to remain free from injury will improve, Ability to verbalize frustration and anger appropriately will improve, Ability to participate in decision making will improve, Ability to verbalize feelings will improve, Ability to identify and develop effective coping behaviors  will improve, and Compliance with prescribed medications will improve  Medication Management: RN will administer medications as ordered by provider, will assess and evaluate patient's response and provide education to patient for prescribed medication. RN will report any adverse and/or side effects to prescribing provider.  Therapeutic Interventions: 1 on 1 counseling sessions, Psychoeducation, Medication administration, Evaluate responses to treatment, Monitor vital signs and CBGs as ordered, Perform/monitor CIWA, COWS, AIMS and Fall Risk screenings as ordered, Perform wound care treatments as ordered.  Evaluation of Outcomes: Progressing   LCSW Treatment Plan for Primary Diagnosis: Schizoaffective disorder, bipolar type (HCC) Long Term Goal(s): Safe transition to appropriate next level of care at discharge, Engage patient in therapeutic group addressing interpersonal concerns.  Short Term Goals: Engage patient in aftercare planning with referrals and resources, Increase social support, Increase emotional regulation, Facilitate acceptance of mental health diagnosis and concerns, Identify triggers associated with mental health/substance abuse issues, and Increase skills for wellness and recovery  Therapeutic Interventions: Assess for all discharge needs, 1 to 1 time with Social worker, Explore available resources and support systems, Assess for adequacy in community support network, Educate family and significant other(s)  on suicide prevention, Complete Psychosocial Assessment, Interpersonal group therapy.  Evaluation of Outcomes: Progressing   Progress in Treatment: Attending groups: Yes. Participating in groups: Yes. Taking medication as prescribed: Yes. Toleration medication: Yes. Family/Significant other contact made: No, will contact:  Pt is disorganized and cannot provide consent Patient understands diagnosis: No. Discussing patient identified problems/goals with staff: Yes. Medical  problems stabilized or resolved: Yes. Denies suicidal/homicidal ideation: Yes. Issues/concerns per patient self-inventory: Yes. Other: N/A   New problem(s) identified: No, Describe:  none reported   New Short Term/Long Term Goal(s): medication stabilization, elimination of SI thoughts, development of comprehensive mental wellness plan.    Patient Goals:  None reported   Discharge Plan or Barriers: CSW will continue to follow and assess for appropriate referrals and possible discharge planning.    Reason for Continuation of Hospitalization: Delusions  Hallucinations Medication stabilization Withdrawal symptoms   Estimated Length of Stay: unknown  Last 3 [place] Suicide Severity Risk Score: Flowsheet Row [facility] (Current[facility] Most recent reading at 03/09/2023  2:00 PM ED from 03/09/2023 in [facility] Most recent reading at 03/09/2023  9:44 AM ED from 01/13/2023 in [facility] Most recent reading at 01/13/2023  2:16 AM  C-SSRS RISK CATEGORY No Risk No Risk No Risk       Last PHQ 2/9 Scores:     No data to display          Scribe for Treatment Team: Izell Rippey, Alexander [place] 03/29/2023 9:44 AM

## 2023-03-29 NOTE — Progress Notes (Signed)
Nursing 1:1 Note   D: Pt is sleeping in bedroom. Respirations noted even and unlabored. No signs of distress present. Medications given as prescribed.   A: Water and snack offered. Emotional support and availability provided. Minimizing stimuli. Offered rest. Maintaining 1: 1 for safety.  R: Pt remains laying in bed with eyes closed. Receptive to care interventions at this time. Remains safe on 1:1.

## 2023-03-29 NOTE — BHH Group Notes (Signed)
Adult Psychoeducational Group Note  Date:  03/29/2023 Time:  10:08 AM  Group Topic/Focus:  Goals Group:   The focus of this group is to help patients establish daily goals to achieve during treatment and discuss how the patient can incorporate goal setting into their daily lives to aide in recovery.  Participation Level:  Active  Participation Quality:  Appropriate and Attentive  Affect:  Appropriate  Cognitive:  Appropriate  Insight: Appropriate  Engagement in Group:  Engaged  Modes of Intervention:  Education  Additional Comments:  Pt attended the goals group and remained appropriate and engaged throughout the duration of the group.   Fara Olden O 03/29/2023, 10:08 AM

## 2023-03-29 NOTE — Progress Notes (Signed)
   03/29/23 0559  15 Minute Checks  Location Bedroom  Visual Appearance Calm  Behavior Sleeping  Sleep (Behavioral Health Patients Only)  Calculate sleep? (Click Yes once per 24 hr at 0600 safety check) Yes  Documented sleep last 24 hours 8.5
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
## 2023-03-29 NOTE — BHH Group Notes (Signed)
Spiritual care group facilitated by Chaplain Dyanne Carrel, [facility]  Group focused on topic of strength. Group members reflected on what thoughts and feelings emerge when they hear this topic. They then engaged in facilitated dialog around how strength is present in their lives. This dialog focused on representing what strength had been to them in their lives (images and patterns given) and what they saw as helpful in their life now (what they needed / wanted).  Activity drew on narrative framework.  Patient Progress: Did not attend.

## 2023-03-29 NOTE — BHH Group Notes (Signed)
Pt did not  attend wrap up group discussion

## 2023-03-29 NOTE — Progress Notes (Signed)
1:1 Nursing note:  Pt is laying in bed with eyes closed. Respirations are even and unlabored. No signs of distress. 1:1 continued for pt safety. Safety maintained.

## 2023-03-29 NOTE — Plan of Care (Signed)
  Problem: Safety: Goal: Periods of time without injury will increase Outcome: Progressing   

## 2023-03-29 NOTE — Progress Notes (Signed)
Nursing 1:1 Note   D: Pt visible in the dayroom watching tv. Respirations noted even and unlabored.   A: Emotional support and availability provided. Maintaining 1: 1 for safety.  R: Receptive to care interventions at this time. Remains safe on 1:1.

## 2023-03-30 NOTE — Progress Notes (Signed)
[facility] MD Progress Note  03/30/2023 7:23 AM [name] [name]  MRN:  [id number] Subjective:   [name] [name] is a 42 yr old male who presented on 9/3 to [facility] with delusions and paranoia, he was admitted to [facility] on 9/4.  PPHx is significant for Schizoaffective Disorder, Bipolar Type, Anxiety, Depression, and Polysubstance Abuse (Cocaine, Meth, EtOH, and an extensive history of aggression, and 6 Prior Psychiatric Hospitalizations (last [facility]- 10/2022), and no history of Suicide Attempts or Self Injurious Behavior. He does have an ACT Team- [facility].   Case was discussed in the multidisciplinary team. MAR was reviewed and patient was compliant with medications.  He received PRN Hydroxyzine yesterday.   Psychiatric Team made the following recommendations yesterday: -Continue Haldol 10 mg TID for psychosis and mood stability -Continue Zyprexa 10 mg AM and 20 mg QHS for psychosis and mood stability -Continue Depakote DR 1000 mg BID for mood stability. -Continue Cogentin 1 mg BID for drug induced EPS -Continue 1:1 -Continue Restoril 15 mg QHS     On interview today patient reports he slept good last night.  He reports his appetite is doing good.  He reports no SI, HI, or VH.  He reports AH- hearing voices but will not elaborate.  He reports no Paranoia or Ideas of Reference.  He reports no issues with his medications other than "it is too much."  When asked for clarification he reports that the medications are making him sleepy during the day.  He reports that he wants to be discharged because he needs to pray.  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  He reports no other concerns are present.     Principal Problem: Schizoaffective disorder, bipolar type (HCC) Diagnosis: Principal Problem:   Schizoaffective disorder, bipolar type (HCC) Active Problems:   Substance-induced psychotic disorder (HCC)  Total Time spent with patient:  I personally spent 35 minutes on the unit in direct  patient care. The direct patient care time included face-to-face time with the patient, reviewing the patient's chart, communicating with other professionals, and coordinating care. Greater than 50% of this time was spent in counseling or coordinating care with the patient regarding goals of hospitalization, psycho-education, and discharge planning needs.   Past Psychiatric History: Schizoaffective Disorder, Bipolar Type, Anxiety, Depression, and Polysubstance Abuse (Cocaine, Meth, EtOH, and an extensive history of aggression, and 6 Prior Psychiatric Hospitalizations (last [facility]- 10/2022), and no history of Suicide Attempts or Self Injurious Behavior.  He does have an ACT Team- [facility].  Past Medical History:  Past Medical History:  Diagnosis Date   Anxiety    Depression    Mental disorder    Schizophrenia (HCC)    History reviewed. No pertinent surgical history. Family History:  Family History  Problem Relation Age of Onset   Mental illness Neg Hx    Family Psychiatric  History: Reports None 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, Pt uses meth    Social History   Socioeconomic History   Marital status: Married    Spouse name: Not on file   Number of children: 1   Years of education: Not on file   Highest education level: Not on file  Occupational History  Occupation: Disabled  Tobacco Use   Smoking status: Every Day    Current packs/day: 1.00    Types: Cigarettes   Smokeless tobacco: Former  Building services engineer status: Unknown  Substance and Sexual Activity   Alcohol use: Not Currently    Comment: haven't drank in months"   Drug use: Yes    Types: Marijuana, Methamphetamines    Comment: Per sister, Pt uses meth   Sexual activity: Yes    Birth  control/protection: None  Other Topics Concern   Not on file  Social History Narrative   Pt refusing to answer any questions during assessment. Pt lives with mother who IVC'd him.      Pt lives with parents and sister.  He is married, but currently separated.  Pt is on disability.   Social Determinants of Health   Financial Resource Strain: Not on file  Food Insecurity: Patient Unable To Answer (03/09/2023)   Hunger Vital Sign    Worried About Running Out of Food in the Last Year: Patient unable to answer    Ran Out of Food in the Last Year: Patient unable to answer  Transportation Needs: Patient Unable To Answer (03/09/2023)   PRAPARE - Administrator, Civil Service (Medical): Patient unable to answer    Lack of Transportation (Non-Medical): Patient unable to answer  Physical Activity: Not on file  Stress: Not on file  Social Connections: Not on file   Additional Social History:                         Sleep: Poor (per report slept 4.5 hrs, per patient good)  Appetite:  Good  Current Medications: Current Facility-Administered Medications  Medication Dose Route Frequency Provider Last Rate Last Admin   acetaminophen (TYLENOL) tablet 650 mg  650 mg Oral Q6H PRN Ardis Hughs, NP   650 mg at 03/22/23 2049   alum & mag hydroxide-simeth (MAALOX/MYLANTA) 200-200-20 MG/5ML suspension 30 mL  30 mL Oral Q4H PRN Ardis Hughs, NP       benztropine (COGENTIN) tablet 1 mg  1 mg Oral BID Abbott Pao, Nadir, MD   1 mg at 03/29/23 2031   diphenhydrAMINE (BENADRYL) capsule 50 mg  50 mg Oral TID PRN Armandina Stammer I, NP   50 mg at 03/26/23 0311   OLANZapine (ZYPREXA) injection 5 mg  5 mg Intramuscular TID PRN Bobbitt, Shalon E, NP   5 mg at 03/25/23 1350   And   diphenhydrAMINE (BENADRYL) injection 50 mg  50 mg Intramuscular TID PRN Bobbitt, Shalon E, NP   50 mg at 03/25/23 1350   divalproex (DEPAKOTE) DR tablet 1,000 mg  1,000 mg Oral BID Abbott Pao, Nadir, MD   1,000 mg at  03/29/23 2031   gabapentin (NEURONTIN) capsule 300 mg  300 mg Oral TID Armandina Stammer I, NP   300 mg at 03/29/23 2031   haloperidol (HALDOL) 2 MG/ML solution 10 mg  10 mg Oral TID Sarita Bottom, MD   10 mg at 03/29/23 2031   hydrOXYzine (ATARAX) tablet 50 mg  50 mg Oral Q4H PRN Armandina Stammer I, NP   50 mg at 03/29/23 2031   OLANZapine (ZYPREXA) tablet 5 mg  5 mg Oral TID PRN Armandina Stammer I, NP   5 mg at 03/26/23 0347   And   LORazepam (ATIVAN) tablet 1 mg  1 mg Oral TID PRN Armandina Stammer I, NP   1 mg at  03/26/23 0311   magnesium hydroxide (MILK OF MAGNESIA) suspension 30 mL  30 mL Oral Daily PRN Ardis Hughs, NP       nicotine (NICODERM CQ - dosed in mg/24 hours) patch 14 mg  14 mg Transdermal Daily Nkwenti, Tyler Aas, NP   14 mg at 03/29/23 0817   OLANZapine zydis (ZYPREXA) disintegrating tablet 10 mg  10 mg Oral Daily Lauro Franklin, MD   10 mg at 03/29/23 0815   OLANZapine zydis (ZYPREXA) disintegrating tablet 20 mg  20 mg Oral QHS Lauro Franklin, MD   20 mg at 03/29/23 2033   temazepam (RESTORIL) capsule 15 mg  15 mg Oral QHS Lauro Franklin, MD   15 mg at 03/29/23 2031   traZODone (DESYREL) tablet 300 mg  300 mg Oral QHS Armandina Stammer I, NP   300 mg at 03/29/23 2031    Lab Results:  No results found for this or any previous visit (from the past 48 hour(s)).   Blood Alcohol level:  Lab Results  Component Value Date   ETH <10 03/09/2023   ETH <10 10/05/2022    Metabolic Disorder Labs: Lab Results  Component Value Date   HGBA1C 5.7 (H) 10/07/2022   MPG 117 10/07/2022   MPG 114 10/05/2022   Lab Results  Component Value Date   PROLACTIN 17.4 10/05/2022   PROLACTIN 45.8 (H) 12/19/2017   Lab Results  Component Value Date   CHOL 156 10/05/2022   TRIG 116 10/05/2022   HDL 40 (L) 10/05/2022   CHOLHDL 3.9 10/05/2022   VLDL 23 10/05/2022   LDLCALC 93 10/05/2022   LDLCALC 69 11/21/2020    Physical Findings: AIMS: Facial and Oral Movements Muscles of Facial  Expression: None, normal Lips and Perioral Area: None, normal Jaw: None, normal Tongue: None, normal,Extremity Movements Upper (arms, wrists, hands, fingers): None, normal Lower (legs, knees, ankles, toes): None, normal, Trunk Movements Neck, shoulders, hips: None, normal, Overall Severity Severity of abnormal movements (highest score from questions above): None, normal Incapacitation due to abnormal movements: None, normal Patient's awareness of abnormal movements (rate only patient's report): No Awareness, Dental Status Current problems with teeth and/or dentures?: No Does patient usually wear dentures?: No  CIWA:    COWS:     Musculoskeletal: Strength & Muscle Tone: within normal limits Gait & Station: normal Patient leans: N/A  Psychiatric Specialty Exam:  Presentation  General Appearance:  Casual  Eye Contact: Fair  Speech: Clear and Coherent; Normal Rate  Speech Volume: Normal  Handedness: Right   Mood and Affect  Mood: -- ("ok")  Affect: Restricted; Labile   Thought Process  Thought Processes: Disorganized (with periods that are linear)  Descriptions of Associations:Loose  Orientation:Full (Time, Place and Person)  Thought Content:Delusions; Paranoid Ideation  History of Schizophrenia/Schizoaffective disorder:Yes  Duration of Psychotic Symptoms:Greater than six months  Hallucinations:Hallucinations: Auditory Description of Auditory Hallucinations: hears voices telling him good things    Ideas of Reference:Delusions  Suicidal Thoughts:Suicidal Thoughts: No    Homicidal Thoughts:Homicidal Thoughts: No     Sensorium  Memory: Immediate Fair  Judgment: Impaired  Insight: Lacking   Executive Functions  Concentration: Fair  Attention Span: Fair  Recall: Fair  Fund of Knowledge: Fair  Language: Fair   Psychomotor Activity  Psychomotor Activity:Psychomotor Activity: Normal     Assets  Assets: Resilience;  Physical Health   Sleep  Sleep:Sleep: Good Number of Hours of Sleep: 8.5      Physical Exam: Physical Exam Vitals and nursing  note reviewed.  Constitutional:      Appearance: Normal appearance. He is normal weight.  HENT:     Head: Normocephalic and atraumatic.  Pulmonary:     Effort: Pulmonary effort is normal.  Musculoskeletal:        General: Normal range of motion.  Neurological:     General: No focal deficit present.     Mental Status: He is alert.    Review of Systems  Respiratory:  Negative for cough and shortness of breath.   Cardiovascular:  Negative for chest pain.  Gastrointestinal:  Negative for abdominal pain, constipation, diarrhea, nausea and vomiting.  Neurological:  Negative for dizziness, weakness and headaches.  Psychiatric/Behavioral:  Positive for hallucinations (AH- hearing voices). Negative for depression and suicidal ideas. The patient is not nervous/anxious.    Blood pressure 115/66, pulse 84, temperature (!) 97.1 F (36.2 C), temperature source Oral, resp. rate 16, height 5\' 3"  (1.6 m), weight 70 kg, SpO2 100%. Body mass index is 27.35 kg/m.   Treatment Plan Summary: Daily contact with patient to assess and evaluate symptoms and progress in treatment and Medication management  Jakai Kirkeby is a 42 yr old male who presented on 9/3 to [facility] with delusions and paranoia, he was admitted to [facility] on 9/4.  PPHx is significant for Schizoaffective Disorder, Bipolar Type, Anxiety, Depression, and Polysubstance Abuse (Cocaine, Meth, EtOH, and an extensive history of aggression, and 6 Prior Psychiatric Hospitalizations (last [facility]- 10/2022), and no history of Suicide Attempts or Self Injurious Behavior. He does have an ACT Team- [facility].    Keigo continues to be delusional and intrusive.  He is reporting some sedation on his current medication regimen but is still out on the unit through the day.  We continue to contact [facility] to update them as he would  still be appropriate for them.  Due to the report of sedation we will not make any changes to his medications at this time but could consider a decrease in his daytime Zyprexa if this continues to be an issue.  We will continue to monitor.   Schizoaffective Disorder, Bipolar Type: -Continue Haldol 10 mg TID for psychosis and mood stability -Continue Zyprexa 10 mg AM and 20 mg QHS for psychosis and mood stability -Continue Depakote DR 1000 mg BID for mood stability. -Continue Cogentin 1 mg BID for drug induced EPS -Continue Agitation Protocol: Zyprexa/Ativan, Zyprexa.Benadryl, and Benadryl -Continue 1:1 -Referral sent to [facility]   Nicotine Dependence: -Continue Nicotine Patch 14 mg daily   -Continue Restoril 15 mg QHS for insomnia -Continue Gabapentin 300 mg TID  -Continue Trazodone 300 mg QHS for sleep -Continue PRN's: Tylenol, Maalox, Atarax, Milk of Magnesia   Lauro Franklin, MD 03/30/2023, 7:23 AM

## 2023-03-30 NOTE — Progress Notes (Signed)
1:1 Nursing note:  Pt is laying in bed . Respirations are even and unlabored. No signs of distress. 1:1 continued for pt safety. Safety maintained.  

## 2023-03-30 NOTE — Progress Notes (Signed)
   03/30/23 0558  15 Minute Checks  Location Bedroom  Visual Appearance Calm  Behavior Sleeping  Sleep (Behavioral Health Patients Only)  Calculate sleep? (Click Yes once per 24 hr at 0600 safety check) Yes  Documented sleep last 24 hours 8.5

## 2023-03-30 NOTE — Plan of Care (Signed)
Problem: Education: Goal: Emotional status will improve Outcome: Progressing   Problem: Activity: Goal: Sleeping patterns will improve Outcome: Progressing   Problem: Health Behavior/Discharge Planning: Goal: Compliance with treatment plan for underlying cause of condition will improve Outcome: Progressing   Problem: Safety: Goal: Periods of time without injury will increase Outcome: Progressing

## 2023-03-30 NOTE — Group Note (Signed)
Date:  03/30/2023 Time:  8:42 PM  Group Topic/Focus:  Wrap-Up Group:   The focus of this group is to help patients review their daily goal of treatment and discuss progress on daily workbooks.    Participation Level:  Did Not Attend  Participation Quality:   N/A  Affect:   N/A  Cognitive:   N/A  Insight: None  Engagement in Group:  None  Modes of Intervention:   N/A  Additional Comments:  Patient did not attend group.  Kennieth Francois 03/30/2023, 8:42 PM

## 2023-03-30 NOTE — BHH Group Notes (Signed)
Adult Psychoeducational Group Note  Date:  03/30/2023 Time:  4:22 PM  Group Topic/Focus:  Goals Group:   The focus of this group is to help patients establish daily goals to achieve during treatment and discuss how the patient can incorporate goal setting into their daily lives to aide in recovery. Orientation:   The focus of this group is to educate the patient on the purpose and policies of crisis stabilization and provide a format to answer questions about their admission.  The group details unit policies and expectations of patients while admitted.  Participation Level:  Active  Participation Quality:  Appropriate  Affect:  Appropriate  Cognitive:  Appropriate  Insight: Appropriate  Engagement in Group:  Engaged  Modes of Intervention:  Discussion  Additional Comments:  Pt attended the goals group and remained appropriate and engaged throughout the duration of the group.   Sheran Lawless 03/30/2023, 4:22 PM

## 2023-03-30 NOTE — Progress Notes (Signed)
Nursing 1:1 Note:  Patient in the dayroom interacting with peers and staff. No distress noted. 1:1 monitoring continues. Patient remains safe at this time.

## 2023-03-30 NOTE — Progress Notes (Signed)
1:1 Nursing note: Pt is laying in bed . Respirations are even and unlabored. No signs of distress. 1:1 continued for pt safety. Safety maintained.

## 2023-03-30 NOTE — Group Note (Signed)
Recreation Therapy Group Note   Group Topic:Health and Wellness  Group Date: 03/30/2023 Start Time: 1040 End Time: 1110 Facilitators: Annalei Friesz-McCall, LRT,CTRS Location: 500 Hall Dayroom   Group Topic: Wellness  Goal Area(s) Addresses:  Patient will define components of whole wellness. Patient will verbalize benefit of whole wellness.  Group Description: Exercise. LRT explained to patients the importance of being physically active. LRT stressed that being active doesn't mean do anything strenuous but simply getting your body moving. Patients took turns leading the group in the exercises of their choosing. Patients were to try and exercise for at least 30 minutes. Patients were instructed to take breaks and get water as needed.   Education: Wellness, Building control surveyor.   Education Outcome: Acknowledges education/In group clarification offered/Needs additional education.    Affect/Mood: Tired   Participation Level: Minimal   Participation Quality: Independent   Behavior: Sedated   Speech/Thought Process: Barely audible    Insight: None   Judgement: None   Modes of Intervention: Music   Patient Response to Interventions:  Challenging    Education Outcome:  In group clarification offered    Clinical Observations/Individualized Feedback: Pt came late into group. Pt was staggering and appeared to be sleepy/drowsy. Pt sat for the majority of the time in group dosing on and off. Pt attempted to complete some of the exercises but was not fully able to.      Plan: Continue to engage patient in RT group sessions 2-3x/week.   Lilu Mcglown-McCall, LRT,CTRS  03/30/2023 11:39 AM

## 2023-03-30 NOTE — Progress Notes (Signed)
Nursing 1:1 note D:Pt observed sleeping in bed with eyes closed. RR even and unlabored. No distress noted. A: 1:1 observation continues for safety  R: pt remains safe  

## 2023-03-30 NOTE — Progress Notes (Signed)
   03/30/23 2115  Psych Admission Type (Psych Patients Only)  Admission Status Involuntary  Psychosocial Assessment  Patient Complaints None  Eye Contact Fair  Facial Expression Anxious;Sad  Affect Blunted  Speech Tangential  Interaction Assertive  Motor Activity Slow  Appearance/Hygiene Layered clothes  Behavior Characteristics Cooperative  Mood Preoccupied  Aggressive Behavior  Effect No apparent injury  Thought Process  Coherency Disorganized  Content Preoccupation;Delusions  Delusions Grandeur  Perception Derealization  Hallucination None reported or observed  Judgment Poor  Confusion None  Danger to Self  Current suicidal ideation? Denies  Danger to Others  Danger to Others None reported or observed

## 2023-03-30 NOTE — Plan of Care (Signed)
Problem: Health Behavior/Discharge Planning: Goal: Compliance with treatment plan for underlying cause of condition will improve Outcome: Progressing   Problem: Safety: Goal: Periods of time without injury will increase Outcome: Progressing

## 2023-03-30 NOTE — Plan of Care (Signed)
Problem: Education: Goal: Emotional status will improve Outcome: Progressing Goal: Mental status will improve Outcome: Progressing   Problem: Activity: Goal: Sleeping patterns will improve Outcome: Progressing

## 2023-03-30 NOTE — Progress Notes (Signed)
Nursing 1:1 Note:  Patient attended dinner this evening with sitter and remained appropriate throughout. After dinner, patient observed walking the halls with no distress. 1:1 monitoring continues. Patient remains safe at this time.

## 2023-03-30 NOTE — Progress Notes (Signed)
CSW spoke with Annia Friendly at [facility] and was told that all beds are currently full at the facility and CSW was told that she would be informed when a bed becomes available.

## 2023-03-30 NOTE — Progress Notes (Signed)
Nursing 1:1 Note:  Patient asleep in his bed. No distress noted. 1:1 monitoring continues. Patient remains safe at this time.

## 2023-03-31 LAB — COMPREHENSIVE METABOLIC PANEL
ALT: 15 U/L (ref 0–44)
AST: 15 U/L (ref 15–41)
Albumin: 3.9 g/dL (ref 3.5–5.0)
Alkaline Phosphatase: 52 U/L (ref 38–126)
Anion gap: 7 (ref 5–15)
BUN: 12 mg/dL (ref 6–20)
CO2: 30 mmol/L (ref 22–32)
Calcium: 8.7 mg/dL — ABNORMAL LOW (ref 8.9–10.3)
Chloride: 98 mmol/L (ref 98–111)
Creatinine, Ser: 0.87 mg/dL (ref 0.61–1.24)
GFR, Estimated: >60 mL/min (ref >60)
Glucose, Bld: 139 mg/dL — ABNORMAL HIGH (ref 70–99)
Potassium: 4 mmol/L (ref 3.5–5.1)
Sodium: 135 mmol/L (ref 135–145)
Total Bilirubin: 0.5 mg/dL (ref 0.3–1.2)
Total Protein: 7.2 g/dL (ref 6.5–8.1)

## 2023-03-31 LAB — CBC WITH DIFFERENTIAL/PLATELET
Abs Immature Granulocytes: 0.06 10*3/uL (ref 0.00–0.07)
Basophils Absolute: 0 10*3/uL (ref 0.0–0.1)
Basophils Relative: 1 %
Eosinophils Absolute: 0.2 10*3/uL (ref 0.0–0.5)
Eosinophils Relative: 4 %
HCT: 41.1 % (ref 39.0–52.0)
Hemoglobin: 12.7 g/dL — ABNORMAL LOW (ref 13.0–17.0)
Immature Granulocytes: 1 %
Lymphocytes Relative: 25 %
Lymphs Abs: 1.2 10*3/uL (ref 0.7–4.0)
MCH: 25.7 pg — ABNORMAL LOW (ref 26.0–34.0)
MCHC: 30.9 g/dL (ref 30.0–36.0)
MCV: 83 fL (ref 80.0–100.0)
Monocytes Absolute: 0.6 10*3/uL (ref 0.1–1.0)
Monocytes Relative: 12 %
Neutro Abs: 2.8 10*3/uL (ref 1.7–7.7)
Neutrophils Relative %: 57 %
Platelets: 202 10*3/uL (ref 150–400)
RBC: 4.95 MIL/uL (ref 4.22–5.81)
RDW: 14.3 % (ref 11.5–15.5)
WBC: 4.9 10*3/uL (ref 4.0–10.5)
nRBC: 0 % (ref 0.0–0.2)

## 2023-03-31 LAB — VALPROIC ACID LEVEL: Valproic Acid Lvl: 47 ug/mL — ABNORMAL LOW (ref 50.0–100.0)

## 2023-03-31 MED ORDER — VALPROIC ACID 250 MG/5ML PO SOLN
1000.0000 mg | Freq: Two times a day (BID) | ORAL | Status: DC
  Administered 2023-03-31 – 2023-04-09 (×18): 1000 mg via ORAL
  Filled 2023-03-31 (×22): qty 20

## 2023-03-31 NOTE — Progress Notes (Signed)
   03/31/23 2115  Psych Admission Type (Psych Patients Only)  Admission Status Involuntary  Psychosocial Assessment  Patient Complaints None  Eye Contact Fair  Facial Expression Anxious;Sad  Affect Blunted  Speech Tangential  Interaction Assertive  Motor Activity Slow  Appearance/Hygiene Layered clothes  Behavior Characteristics Cooperative  Mood Suspicious;Preoccupied;Pleasant  Aggressive Behavior  Effect No apparent injury  Thought Process  Coherency Disorganized  Content Preoccupation;Delusions  Delusions Grandeur  Perception Derealization  Hallucination None reported or observed  Judgment Poor  Confusion None  Danger to Self  Current suicidal ideation? Denies  Danger to Others  Danger to Others None reported or observed

## 2023-03-31 NOTE — B[facility] Group Notes (Signed)
Adult Psychoeducational Group Note  Date:  03/31/2023 Time:  9:38 AM  Group Topic/Focus:  Goals Group:   The focus of this group is to help patients establish daily goals to achieve during treatment and discuss how the patient can incorporate goal setting into their daily lives to aide in recovery. Orientation:   The focus of this group is to educate the patient on the purpose and policies of crisis stabilization and provide a format to answer questions about their admission.  The group details unit policies and expectations of patients while admitted.  Participation Level:  Active  Participation Quality:  Appropriate  Affect:  Appropriate  Cognitive:  Appropriate  Insight: Appropriate  Engagement in Group:  Engaged  Modes of Intervention:  Education  Additional Comments:  Pt attended the goals group and remained appropriate and engaged throughout the duration of the group.   Sheran Lawless 03/31/2023, 9:38 AM

## 2023-03-31 NOTE — Progress Notes (Signed)
Patient in his room sitting. No distress noted. 1:1 monitoring continues. Patient remains safe at this time.

## 2023-03-31 NOTE — Progress Notes (Signed)
[facility] MD Progress Note  03/31/2023 11:00 AM [name] [name]  MRN:  [id number] Subjective:   [name] [name] is a 42 yr old male who presented on 9/3 to [facility] with delusions and paranoia, he was admitted to [facility] on 9/4.  PPHx is significant for Schizoaffective Disorder, Bipolar Type, Anxiety, Depression, and Polysubstance Abuse (Cocaine, Meth, EtOH, and an extensive history of aggression, and 6 Prior Psychiatric Hospitalizations (last [facility]- 10/2022), and no history of Suicide Attempts or Self Injurious Behavior. He does have an ACT Team- [facility].   Case was discussed in the multidisciplinary team. MAR was reviewed and patient was compliant with medications.  He received PRN Hydroxyzine yesterday.   Psychiatric Team made the following recommendations yesterday: -Continue Haldol 10 mg TID for psychosis and mood stability -Continue Zyprexa 10 mg AM and 20 mg QHS for psychosis and mood stability -Continue Depakote DR 1000 mg BID for mood stability. -Continue Cogentin 1 mg BID for drug induced EPS -Continue 1:1 -Continue Restoril 15 mg QHS     On interview today patient reports he slept good last night.  He reports his appetite is doing good.  He reports no SI, HI, or AVH.  He reports no Paranoia or Ideas of Reference.  He reports no issues with his medications.  He reports that today he is feeling sad.  He reports that this is due to missing his wife and kids.  When asked if his family has visited him he reports that they have abandon him and so he only has himself.  He reports no other concerns at present.    Principal Problem: Schizoaffective disorder, bipolar type (HCC) Diagnosis: Principal Problem:   Schizoaffective disorder, bipolar type (HCC) Active Problems:   Substance-induced psychotic disorder (HCC)  Total Time spent with patient:  I personally spent 35 minutes on the unit in direct patient care. The direct patient care time included face-to-face time with the patient, reviewing the  patient's chart, communicating with other professionals, and coordinating care. Greater than 50% of this time was spent in counseling or coordinating care with the patient regarding goals of hospitalization, psycho-education, and discharge planning needs.   Past Psychiatric History: Schizoaffective Disorder, Bipolar Type, Anxiety, Depression, and Polysubstance Abuse (Cocaine, Meth, EtOH, and an extensive history of aggression, and 6 Prior Psychiatric Hospitalizations (last [facility]- 10/2022), and no history of Suicide Attempts or Self Injurious Behavior.  He does have an ACT Team- [facility].  Past Medical History:  Past Medical History:  Diagnosis Date   Anxiety    Depression    Mental disorder    Schizophrenia (HCC)    History reviewed. No pertinent surgical history. Family History:  Family History  Problem Relation Age of Onset   Mental illness Neg Hx    Family Psychiatric  History: Reports None 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, Pt uses meth    Social History   Socioeconomic History   Marital status: Married    Spouse name: Not on file   Number of children: 1   Years of education: Not on file   Highest education level: Not on file  Occupational History   Occupation: Disabled  Tobacco Use   Smoking status: Every Day    Current packs/day: 1.00  Types: Cigarettes   Smokeless tobacco: Former  Building services engineer status: Unknown  Substance and Sexual Activity   Alcohol use: Not Currently    Comment: haven't drank in months"   Drug use: Yes    Types: Marijuana, Methamphetamines    Comment: 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 Pt lives with mother who IVC'd him.      Pt lives with parents and sister.  He is married, but currently separated.  Pt is on disability.   Social Determinants of Health   Financial Resource Strain: Not on file  Food Insecurity: Patient Unable To Answer (03/09/2023)   Hunger Vital Sign    Worried About Running Out of Food in the Last Year: Patient unable to answer    Ran Out of Food in the Last Year: Patient unable to answer  Transportation Needs: Patient Unable To Answer (03/09/2023)   PRAPARE - Administrator, Civil Service (Medical): Patient unable to answer    Lack of Transportation (Non-Medical): Patient unable to answer  Physical Activity: Not on file  Stress: Not on file  Social Connections: Not on file   Additional Social History:                         Sleep: Good   Appetite:  Good  Current Medications: Current Facility-Administered Medications  Medication Dose Route Frequency Provider Last Rate Last Admin   acetaminophen (TYLENOL) tablet 650 mg  650 mg Oral Q6H PRN Ardis Hughs, NP   650 mg at 03/22/23 2049   alum & mag hydroxide-simeth (MAALOX/MYLANTA) 200-200-20 MG/5ML suspension 30 mL  30 mL Oral Q4H PRN Ardis Hughs, NP       benztropine (COGENTIN) tablet 1 mg  1 mg Oral BID Abbott Pao, Nadir, MD   1 mg at 03/31/23 0816   diphenhydrAMINE (BENADRYL) capsule 50 mg  50 mg Oral TID PRN Armandina Stammer I, NP   50 mg at 03/26/23 0311   OLANZapine (ZYPREXA) injection 5 mg  5 mg Intramuscular TID PRN Bobbitt, Shalon E, NP   5 mg at 03/25/23 1350   And   diphenhydrAMINE (BENADRYL) injection 50 mg  50 mg Intramuscular TID PRN Bobbitt, Shalon E, NP   50 mg at 03/25/23 1350   gabapentin (NEURONTIN) capsule 300 mg  300 mg Oral TID Armandina Stammer I, NP   300 mg at 03/31/23 0816   haloperidol (HALDOL) 2 MG/ML solution 10 mg  10 mg Oral TID Sarita Bottom, MD   10 mg at 03/31/23 0815   hydrOXYzine (ATARAX)  tablet 50 mg  50 mg Oral Q4H PRN Armandina Stammer I, NP   50 mg at 03/30/23 2048   OLANZapine (ZYPREXA) tablet 5 mg  5 mg Oral TID PRN Armandina Stammer I, NP   5 mg at 03/26/23 2841   And   LORazepam (ATIVAN) tablet 1 mg  1 mg Oral TID PRN Armandina Stammer I, NP   1 mg at 03/26/23 0311   magnesium hydroxide (MILK OF MAGNESIA) suspension 30 mL  30 mL Oral Daily PRN Ardis Hughs, NP       nicotine (NICODERM CQ - dosed in mg/24 hours) patch 14 mg  14 mg Transdermal Daily Starleen Blue, NP   14  mg at 03/30/23 0808   OLANZapine zydis (ZYPREXA) disintegrating tablet 10 mg  10 mg Oral Daily Lauro Franklin, MD   10 mg at 03/31/23 0816   OLANZapine zydis (ZYPREXA) disintegrating tablet 20 mg  20 mg Oral QHS Lauro Franklin, MD   20 mg at 03/30/23 2048   temazepam (RESTORIL) capsule 15 mg  15 mg Oral QHS Lauro Franklin, MD   15 mg at 03/30/23 2048   traZODone (DESYREL) tablet 300 mg  300 mg Oral QHS Armandina Stammer I, NP   300 mg at 03/30/23 2048   valproic acid (DEPAKENE) 250 MG/5ML solution 1,000 mg  1,000 mg Oral BID Lauro Franklin, MD        Lab Results:  Results for orders placed or performed during the hospital encounter of 03/09/23 (from the past 48 hour(s))  Valproic acid level     Status: Abnormal   Collection Time: 03/31/23  6:43 AM  Result Value Ref Range   Valproic Acid Lvl 47 (L) 50.0 - 100.0 ug/mL    Comment: Performed at [facility], [address] [place], [place] [address]  Comprehensive metabolic panel     Status: Abnormal   Collection Time: 03/31/23  6:43 AM  Result Value Ref Range   Sodium 135 135 - 145 mmol/L   Potassium 4.0 3.5 - 5.1 mmol/L   Chloride 98 98 - 111 mmol/L   CO2 30 22 - 32 mmol/L   Glucose, Bld 139 (H) 70 - 99 mg/dL    Comment: Glucose reference range applies only to samples taken after fasting for at least 8 hours.   BUN 12 6 - 20 mg/dL   Creatinine, Ser 6.96 0.61 - 1.24 mg/dL   Calcium 8.7 (L) 8.9 - 10.3 mg/dL   Total  Protein 7.2 6.5 - 8.1 g/dL   Albumin 3.9 3.5 - 5.0 g/dL   AST 15 15 - 41 U/L   ALT 15 0 - 44 U/L   Alkaline Phosphatase 52 38 - 126 U/L   Total Bilirubin 0.5 0.3 - 1.2 mg/dL   GFR, Estimated >29 >52 mL/min    Comment: (NOTE) Calculated using the CKD-EPI Creatinine Equation (2021)    Anion gap 7 5 - 15    Comment: Performed at [facility], [address], [place], [place] [address]  CBC with Differential/Platelet     Status: Abnormal   Collection Time: 03/31/23  6:43 AM  Result Value Ref Range   WBC 4.9 4.0 - 10.5 K/uL   RBC 4.95 4.22 - 5.81 MIL/uL   Hemoglobin 12.7 (L) 13.0 - 17.0 g/dL   HCT 44.0 10.2 - 72.5 %   MCV 83.0 80.0 - 100.0 fL   MCH 25.7 (L) 26.0 - 34.0 pg   MCHC 30.9 30.0 - 36.0 g/dL   RDW 36.6 44.0 - 34.7 %   Platelets 202 150 - 400 K/uL   nRBC 0.0 0.0 - 0.2 %   Neutrophils Relative % 57 %   Neutro Abs 2.8 1.7 - 7.7 K/uL   Lymphocytes Relative 25 %   Lymphs Abs 1.2 0.7 - 4.0 K/uL   Monocytes Relative 12 %   Monocytes Absolute 0.6 0.1 - 1.0 K/uL   Eosinophils Relative 4 %   Eosinophils Absolute 0.2 0.0 - 0.5 K/uL   Basophils Relative 1 %   Basophils Absolute 0.0 0.0 - 0.1 K/uL   Immature Granulocytes 1 %   Abs Immature Granulocytes 0.06 0.00 - 0.07  K/uL    Comment: Performed at [facility], [address] [place], [place] [address]     Blood Alcohol level:  Lab Results  Component Value Date   [facility] <10 03/09/2023   ETH <10 10/05/2022    Metabolic Disorder Labs: Lab Results  Component Value Date   HGBA1C 5.7 (H) 10/07/2022   MPG 117 10/07/2022   MPG 114 10/05/2022   Lab Results  Component Value Date   PROLACTIN 17.4 10/05/2022   PROLACTIN 45.8 (H) 12/19/2017   Lab Results  Component Value Date   CHOL 156 10/05/2022   TRIG 116 10/05/2022   HDL 40 (L) 10/05/2022   CHOLHDL 3.9 10/05/2022   VLDL 23 10/05/2022   LDLCALC 93 10/05/2022   LDLCALC 69 11/21/2020    Physical Findings: AIMS: Facial and Oral  Movements Muscles of Facial Expression: None, normal Lips and Perioral Area: None, normal Jaw: None, normal Tongue: None, normal,Extremity Movements Upper (arms, wrists, hands, fingers): None, normal Lower (legs, knees, ankles, toes): None, normal, Trunk Movements Neck, shoulders, hips: None, normal, Overall Severity Severity of abnormal movements (highest score from questions above): None, normal Incapacitation due to abnormal movements: None, normal Patient's awareness of abnormal movements (rate only patient's report): No Awareness, Dental Status Current problems with teeth and/or dentures?: No Does patient usually wear dentures?: No  CIWA:    COWS:     Musculoskeletal: Strength & Muscle Tone: within normal limits Gait & Station: normal Patient leans: N/A  Psychiatric Specialty Exam:  Presentation  General Appearance:  Casual  Eye Contact: Fair  Speech: Clear and Coherent; Normal Rate  Speech Volume: Normal  Handedness: Right   Mood and Affect  Mood: Dysphoric  Affect: Flat   Thought Process  Thought Processes: Linear  Descriptions of Associations:Intact  Orientation:Full (Time, Place and Person)  Thought Content:Rumination  History of Schizophrenia/Schizoaffective disorder:Yes  Duration of Psychotic Symptoms:Greater than six months  Hallucinations:Hallucinations: None    Ideas of Reference:None  Suicidal Thoughts:Suicidal Thoughts: No    Homicidal Thoughts:Homicidal Thoughts: No     Sensorium  Memory: Immediate Fair  Judgment: Intact  Insight: Present   Executive Functions  Concentration: Fair  Attention Span: Fair  Recall: Fair  Fund of Knowledge: Fair  Language: Good   Psychomotor Activity  Psychomotor Activity:Psychomotor Activity: Normal     Assets  Assets: Resilience; Physical Health   Sleep  Sleep:Sleep: Good Number of Hours of Sleep: 8.5      Physical Exam: Physical Exam Vitals and  nursing note reviewed.  Constitutional:      General: He is not in acute distress.    Appearance: Normal appearance. He is normal weight. He is not ill-appearing or toxic-appearing.  HENT:     Head: Normocephalic and atraumatic.  Pulmonary:     Effort: Pulmonary effort is normal.  Musculoskeletal:        General: Normal range of motion.  Neurological:     General: No focal deficit present.     Mental Status: He is alert.    Review of Systems  Respiratory:  Negative for cough and shortness of breath.   Cardiovascular:  Negative for chest pain.  Gastrointestinal:  Negative for abdominal pain, constipation, diarrhea, nausea and vomiting.  Neurological:  Negative for dizziness, weakness and headaches.  Psychiatric/Behavioral:  Positive for depression. Negative for hallucinations and suicidal ideas. The patient is not nervous/anxious.    Blood pressure (!) 113/95, pulse 87, temperature 98.1 F (36.7 C), temperature source Oral, resp. rate 16,  height 5\' 3"  (1.6 m), weight 70 kg, SpO2 99%. Body mass index is 27.35 kg/m.   Treatment Plan Summary: Daily contact with patient to assess and evaluate symptoms and progress in treatment and Medication management  Obi Ogaz is a 42 yr old male who presented on 9/3 to [facility] with delusions and paranoia, he was admitted to [facility] on 9/4.  PPHx is significant for Schizoaffective Disorder, Bipolar Type, Anxiety, Depression, and Polysubstance Abuse (Cocaine, Meth, EtOH, and an extensive history of aggression, and 6 Prior Psychiatric Hospitalizations (last [facility]- 10/2022), and no history of Suicide Attempts or Self Injurious Behavior. He does have an ACT Team- [facility].    Dicky is less intrusive and has been more linear in his thinking.  Today he is voicing depression due to being away from his wife and children.  His Depakote level this morning is concerning as it has dropped to 47 despite an increase being made in his Depakote.  This would imply  that he is cheeking some of his medication.  Have informed nursing staff of this and also we will switch his Depakote to Depakene to minimize this risk.  We will redraw a Depakote level on Friday morning and see if his Depakote level response to this change.  We will not make any other changes to his medication at this time.  We will continue to monitor.   Schizoaffective Disorder, Bipolar Type: -Continue Haldol 10 mg TID for psychosis and mood stability -Continue Zyprexa 10 mg AM and 20 mg QHS for psychosis and mood stability -Change Depakote DR to Depakene 1000 mg BID for mood stability. -Continue Cogentin 1 mg BID for drug induced EPS -Continue Agitation Protocol: Zyprexa/Ativan, Zyprexa.Benadryl, and Benadryl -Continue 1:1 -Referral sent to [facility]   Nicotine Dependence: -Continue Nicotine Patch 14 mg daily   -Continue Restoril 15 mg QHS for insomnia -Continue Gabapentin 300 mg TID  -Continue Trazodone 300 mg QHS for sleep -Continue PRN's: Tylenol, Maalox, Atarax, Milk of Magnesia   Lauro Franklin, MD 03/31/2023, 11:00 AM

## 2023-03-31 NOTE — Progress Notes (Signed)
Nursing 1:1 note D:Pt observed sleeping in bed with eyes closed. RR even and unlabored. No distress noted. A: 1:1 observation continues for safety  R: pt remains safe  

## 2023-03-31 NOTE — Plan of Care (Signed)
°  Problem: Education: °Goal: Emotional status will improve °Outcome: Progressing °Goal: Mental status will improve °Outcome: Progressing °  °Problem: Activity: °Goal: Interest or engagement in activities will improve °Outcome: Progressing °  °

## 2023-03-31 NOTE — Plan of Care (Signed)
  Problem: Education: Goal: Emotional status will improve Outcome: Progressing Goal: Mental status will improve Outcome: Progressing   Problem: Activity: Goal: Interest or engagement in activities will improve Outcome: Progressing Goal: Sleeping patterns will improve Outcome: Progressing

## 2023-03-31 NOTE — Progress Notes (Signed)
Patient in his room sleeping. No distress noted. 1:1 monitoring continues. Patient remains safe at this time.

## 2023-03-31 NOTE — Progress Notes (Signed)
   03/31/23 0845  Psych Admission Type (Psych Patients Only)  Admission Status Involuntary  Psychosocial Assessment  Patient Complaints None  Eye Contact Fair  Facial Expression Flat  Affect Blunted  Speech Tangential  Interaction Assertive  Motor Activity Restless  Appearance/Hygiene Layered clothes  Behavior Characteristics Cooperative  Mood Preoccupied;Pleasant  Thought Process  Coherency Disorganized  Content Delusions;Preoccupation  Delusions Grandeur  Perception Derealization  Hallucination None reported or observed  Judgment Poor  Confusion None  Danger to Self  Current suicidal ideation? Denies  Danger to Others  Danger to Others None reported or observed

## 2023-03-31 NOTE — Group Note (Signed)
Recreation Therapy Group Note   Group Topic:Self-Esteem  Group Date: 03/31/2023 Start Time: 1014 End Time: 1055 Facilitators: Luvenia Cranford-McCall, LRT,CTRS Location: [address] Day[address]   Group Topic: Self Esteem    Goal Area(s) Addresses:  Patient will appropriately identify what self esteem is.  Patient will create a shield of armor describing themselves.  Patient will successfully identify positive attributes about themselves.  Patient will acknowledge benefit of improved self-esteem.  Patient will follow instructions on 1st prompt.    Intervention / Activity: Self-Esteem Shield. Patient attended a recreation therapy group session focused on self esteem. Patient identified what self esteem is, and why it is important to have high self esteem during group discussion. LRT gave patiens a sheet with a breakdown of what each quadrant was to represent. Patient was asked to create their own shield to show off their unique attributes, four quadrants reflected the following:  The Upper Left quadrant- reasons they are unique/special. The Upper Right quadrant- things that they love to do. The Lower Left quadrant- goals for their future. The Lower Right quadrant- character words that describe them.    Patients were provided sheets with the shield printed on them and colored pencils, markers and crayons to complete the activity.  Patients and writer had group related discussions while individually working on their activity.  Patients were debriefed on the importance of healthy self esteem and offered a handout for ways to increase self esteem.    Education: Self esteem, Communication, Positive self-talk, Discharge Planning   Education Outcome: Acknowledges education/Verbalizes understanding of Education/In group clarification offered/Needs further education    Affect/Mood: Flat   Participation Level: None   Participation Quality: None   Behavior: Sleepy   Speech/Thought Process:  None   Insight: None   Judgement: None   Modes of Intervention: Art   Patient Response to Interventions:  None   Education Outcome:  In group clarification offered    Clinical Observations/Individualized Feedback: Pt came in for the last few minutes of group. Pt sat in front of the window and was nodding off in and out of sleep.      Plan: Continue to engage patient in RT group sessions 2-3x/week.   Jakhari Space-McCall, LRT,CTRS 03/31/2023 11:05 AM

## 2023-03-31 NOTE — Group Note (Signed)
Date:  03/31/2023 Time:  8:52 PM  Group Topic/Focus:  Wrap-Up Group:   The focus of this group is to help patients review their daily goal of treatment and discuss progress on daily workbooks.    Participation Level:  Did Not Attend  Participation Quality:   N/A  Affect:   N/A  Cognitive:   N/A  Insight: None  Engagement in Group:  None  Modes of Intervention:   N/A  Additional Comments:  Patient did not attend group.   Kennieth Francois 03/31/2023, 8:52 PM

## 2023-04-01 NOTE — Progress Notes (Signed)
   04/01/23 2145  Psych Admission Type (Psych Patients Only)  Admission Status Involuntary  Psychosocial Assessment  Patient Complaints Restlessness  Eye Contact Fair  Facial Expression Anxious;Sad  Affect Blunted  Speech Tangential  Interaction Assertive  Motor Activity Slow  Appearance/Hygiene Layered clothes  Behavior Characteristics Cooperative  Mood Suspicious;Preoccupied  Aggressive Behavior  Effect No apparent injury  Thought Process  Coherency Disorganized  Content Preoccupation;Delusions  Delusions Grandeur  Perception Derealization  Hallucination None reported or observed  Judgment Poor  Confusion None  Danger to Self  Current suicidal ideation? Denies  Danger to Others  Danger to Others None reported or observed

## 2023-04-01 NOTE — Group Note (Signed)
Recreation Therapy Group Note   Group Topic:Other  Group Date: 04/01/2023 Start Time: 1030 End Time: 1100 Facilitators: Asani Deniston-McCall, LRT,CTRS Location: [address] Day[address]   Goal Area(s) Addresses:  Patient will effectively work in a team with other group members. Patient will verbalize importance of using appropriate problem solving techniques.  Patient will identify positive change associated with effective problem solving skills.   Group Description: Brain Teasers. Patients were grouped together and given two sheets of brain teasers. Patients were given 15 minutes to work together to solve the puzzles presented on the worksheets. LRT went over answers to the puzzles with patients when their time was up.    Education Outcome: Acknowledges understanding/In group clarification offered/Needs additional education.    Affect/Mood: N/A   Participation Level: Did not attend    Clinical Observations/Individualized Feedback:     Plan: Continue to engage patient in RT group sessions 2-3x/week.   Amalio Loe-McCall, LRT,CTRS 04/01/2023 12:40 PM

## 2023-04-01 NOTE — Progress Notes (Signed)
Nursing 1:1 note D:Pt observed sleeping in bed with eyes closed. RR even and unlabored. No distress noted. A: 1:1 observation continues for safety  R: pt remains safe  

## 2023-04-01 NOTE — Progress Notes (Signed)
CSW called ACT Team ([facility]) and spoke with CM Genta about the aforementioned pt. CSW was informed that NP Kennyth Arnold was out on emergency leave. Ms. Orpah Melter expressed concerns about the pt's symptoms and behaviors and expressed that she would need to consult her team prior to making any commitments concerning continuity of care. CSW discussed with Dr. Renaldo Fiddler and encouraged him to call and discuss with [facility]. CSW Will continue to monitor.

## 2023-04-01 NOTE — Plan of Care (Signed)
Problem: Education: Goal: Emotional status will improve Outcome: Progressing   Problem: Activity: Goal: Interest or engagement in activities will improve Outcome: Progressing Goal: Sleeping patterns will improve Outcome: Progressing   Problem: Safety: Goal: Periods of time without injury will increase Outcome: Progressing

## 2023-04-01 NOTE — Plan of Care (Signed)
  Problem: Education: Goal: Verbalization of understanding the information provided will improve Outcome: Progressing   Problem: Activity: Goal: Interest or engagement in activities will improve Outcome: Progressing Goal: Sleeping patterns will improve Outcome: Progressing   Problem: Coping: Goal: Ability to verbalize frustrations and anger appropriately will improve Outcome: Progressing   Problem: Safety: Goal: Periods of time without injury will increase Outcome: Progressing

## 2023-04-01 NOTE — Progress Notes (Signed)
[facility] MD Progress Note  04/01/2023 12:20 PM [name] [name]  MRN:  [id number] Subjective:   [name] [name] is a 42 yr old male who presented on 9/3 to [facility] with delusions and paranoia, he was admitted to [facility] on 9/4.  PPHx is significant for Schizoaffective Disorder, Bipolar Type, Anxiety, Depression, and Polysubstance Abuse (Cocaine, Meth, EtOH, and an extensive history of aggression, and 6 Prior Psychiatric Hospitalizations (last [facility]- 10/2022), and no history of Suicide Attempts or Self Injurious Behavior. He does have an ACT Team- [facility].   Case was discussed in the multidisciplinary team. MAR was reviewed and patient was compliant with medications.  He received PRN Hydroxyzine yesterday.   Psychiatric Team made the following recommendations yesterday: -Continue Haldol 10 mg TID for psychosis and mood stability -Continue Zyprexa 10 mg AM and 20 mg QHS for psychosis and mood stability -Change Depakote DR to Depakene 1000 mg BID for mood stability.  -Continue Cogentin 1 mg BID for drug induced EPS -Continue 1:1 -Continue Restoril 15 mg QHS     On interview today patient reports he slept good last night.  He reports his appetite is doing good.  He reports no SI, HI, or AVH.  He reports no Paranoia or Ideas of Reference.  He reports no issues with his medications.  He reports he continues to have some sadness due to missing his family.  He reports that he is overcoming it and will be fine in the long run.  Discussed with him that we would ask his ACT team to come and evaluate him and he reported understanding.  He reports no other concerns at present.    Principal Problem: Schizoaffective disorder, bipolar type (HCC) Diagnosis: Principal Problem:   Schizoaffective disorder, bipolar type (HCC) Active Problems:   Substance-induced psychotic disorder (HCC)  Total Time spent with patient:  I personally spent 35 minutes on the unit in direct patient care. The direct patient care time  included face-to-face time with the patient, reviewing the patient's chart, communicating with other professionals, and coordinating care. Greater than 50% of this time was spent in counseling or coordinating care with the patient regarding goals of hospitalization, psycho-education, and discharge planning needs.   Past Psychiatric History: Schizoaffective Disorder, Bipolar Type, Anxiety, Depression, and Polysubstance Abuse (Cocaine, Meth, EtOH, and an extensive history of aggression, and 6 Prior Psychiatric Hospitalizations (last [facility]- 10/2022), and no history of Suicide Attempts or Self Injurious Behavior.  He does have an ACT Team- [facility].  Past Medical History:  Past Medical History:  Diagnosis Date   Anxiety    Depression    Mental disorder    Schizophrenia (HCC)    History reviewed. No pertinent surgical history. Family History:  Family History  Problem Relation Age of Onset   Mental illness Neg Hx    Family Psychiatric  History: Reports None 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, Pt uses meth    Social History   Socioeconomic History   Marital status: Married    Spouse name: Not on file   Number of children: 1   Years of education: Not on file   Highest education level: Not on file  Occupational History   Occupation: Disabled  Tobacco Use   Smoking  status: Every Day    Current packs/day: 1.00    Types: Cigarettes   Smokeless tobacco: Former  Building services engineer status: Unknown  Substance and Sexual Activity   Alcohol use: Not Currently    Comment: haven't drank in months"   Drug use: Yes    Types: Marijuana, Methamphetamines    Comment: Per sister, Pt uses meth   Sexual activity: Yes    Birth control/protection: None  Other Topics  Concern   Not on file  Social History Narrative   Pt refusing to answer any questions during assessment. Pt lives with mother who IVC'd him.      Pt lives with parents and sister.  He is married, but currently separated.  Pt is on disability.   Social Determinants of Health   Financial Resource Strain: Not on file  Food Insecurity: Patient Unable To Answer (03/09/2023)   Hunger Vital Sign    Worried About Running Out of Food in the Last Year: Patient unable to answer    Ran Out of Food in the Last Year: Patient unable to answer  Transportation Needs: Patient Unable To Answer (03/09/2023)   PRAPARE - Administrator, Civil Service (Medical): Patient unable to answer    Lack of Transportation (Non-Medical): Patient unable to answer  Physical Activity: Not on file  Stress: Not on file  Social Connections: Not on file   Additional Social History:                         Sleep: Good   Appetite:  Good  Current Medications: Current Facility-Administered Medications  Medication Dose Route Frequency Provider Last Rate Last Admin   acetaminophen (TYLENOL) tablet 650 mg  650 mg Oral Q6H PRN Ardis Hughs, NP   650 mg at 03/22/23 2049   alum & mag hydroxide-simeth (MAALOX/MYLANTA) 200-200-20 MG/5ML suspension 30 mL  30 mL Oral Q4H PRN Ardis Hughs, NP       benztropine (COGENTIN) tablet 1 mg  1 mg Oral BID Abbott Pao, Nadir, MD   1 mg at 04/01/23 0811   diphenhydrAMINE (BENADRYL) capsule 50 mg  50 mg Oral TID PRN Armandina Stammer I, NP   50 mg at 03/26/23 0311   OLANZapine (ZYPREXA) injection 5 mg  5 mg Intramuscular TID PRN Bobbitt, Shalon E, NP   5 mg at 03/25/23 1350   And   diphenhydrAMINE (BENADRYL) injection 50 mg  50 mg Intramuscular TID PRN Bobbitt, Shalon E, NP   50 mg at 03/25/23 1350   gabapentin (NEURONTIN) capsule 300 mg  300 mg Oral TID Armandina Stammer I, NP   300 mg at 04/01/23 0811   haloperidol (HALDOL) 2 MG/ML solution 10 mg  10 mg Oral TID Sarita Bottom,  MD   10 mg at 04/01/23 0811   hydrOXYzine (ATARAX) tablet 50 mg  50 mg Oral Q4H PRN Armandina Stammer I, NP   50 mg at 03/31/23 2051   OLANZapine (ZYPREXA) tablet 5 mg  5 mg Oral TID PRN Armandina Stammer I, NP   5 mg at 03/26/23 2956   And   LORazepam (ATIVAN) tablet 1 mg  1 mg Oral TID PRN Armandina Stammer I, NP   1 mg at 03/26/23 0311   magnesium hydroxide (MILK OF MAGNESIA) suspension 30 mL  30 mL Oral Daily PRN Ardis Hughs, NP       nicotine (NICODERM CQ - dosed in mg/24 hours) patch 14  mg  14 mg Transdermal Daily Starleen Blue, NP   14 mg at 03/30/23 0808   OLANZapine zydis (ZYPREXA) disintegrating tablet 10 mg  10 mg Oral Daily Lauro Franklin, MD   10 mg at 04/01/23 0811   OLANZapine zydis (ZYPREXA) disintegrating tablet 20 mg  20 mg Oral QHS Lauro Franklin, MD   20 mg at 03/31/23 2051   temazepam (RESTORIL) capsule 15 mg  15 mg Oral QHS Lauro Franklin, MD   15 mg at 03/31/23 2051   traZODone (DESYREL) tablet 300 mg  300 mg Oral QHS Armandina Stammer I, NP   300 mg at 03/31/23 2051   valproic acid (DEPAKENE) 250 MG/5ML solution 1,000 mg  1,000 mg Oral BID Lauro Franklin, MD   1,000 mg at 04/01/23 2956    Lab Results:  Results for orders placed or performed during the hospital encounter of 03/09/23 (from the past 48 hour(s))  Valproic acid level     Status: Abnormal   Collection Time: 03/31/23  6:43 AM  Result Value Ref Range   Valproic Acid Lvl 47 (L) 50.0 - 100.0 ug/mL    Comment: Performed at [facility], [address] [place], [place] [address]  Comprehensive metabolic panel     Status: Abnormal   Collection Time: 03/31/23  6:43 AM  Result Value Ref Range   Sodium 135 135 - 145 mmol/L   Potassium 4.0 3.5 - 5.1 mmol/L   Chloride 98 98 - 111 mmol/L   CO2 30 22 - 32 mmol/L   Glucose, Bld 139 (H) 70 - 99 mg/dL    Comment: Glucose reference range applies only to samples taken after fasting for at least 8 hours.   BUN 12 6 - 20 mg/dL    Creatinine, Ser 6.57 0.61 - 1.24 mg/dL   Calcium 8.7 (L) 8.9 - 10.3 mg/dL   Total Protein 7.2 6.5 - 8.1 g/dL   Albumin 3.9 3.5 - 5.0 g/dL   AST 15 15 - 41 U/L   ALT 15 0 - 44 U/L   Alkaline Phosphatase 52 38 - 126 U/L   Total Bilirubin 0.5 0.3 - 1.2 mg/dL   GFR, Estimated >84 >69 mL/min    Comment: (NOTE) Calculated using the CKD-EPI Creatinine Equation (2021)    Anion gap 7 5 - 15    Comment: Performed at [facility], [address], [place], [place] [address]  CBC with Differential/Platelet     Status: Abnormal   Collection Time: 03/31/23  6:43 AM  Result Value Ref Range   WBC 4.9 4.0 - 10.5 K/uL   RBC 4.95 4.22 - 5.81 MIL/uL   Hemoglobin 12.7 (L) 13.0 - 17.0 g/dL   HCT 84.1 32.4 - 40.1 %   MCV 83.0 80.0 - 100.0 fL   MCH 25.7 (L) 26.0 - 34.0 pg   MCHC 30.9 30.0 - 36.0 g/dL   RDW 02.7 25.3 - 66.4 %   Platelets 202 150 - 400 K/uL   nRBC 0.0 0.0 - 0.2 %   Neutrophils Relative % 57 %   Neutro Abs 2.8 1.7 - 7.7 K/uL   Lymphocytes Relative 25 %   Lymphs Abs 1.2 0.7 - 4.0 K/uL   Monocytes Relative 12 %   Monocytes Absolute 0.6 0.1 - 1.0 K/uL   Eosinophils Relative 4 %   Eosinophils Absolute 0.2 0.0 - 0.5 K/uL   Basophils Relative 1 %   Basophils Absolute 0.0 0.0 - 0.1 K/uL  Immature Granulocytes 1 %   Abs Immature Granulocytes 0.06 0.00 - 0.07 K/uL    Comment: Performed at [facility], [address] [place], [place] [address]     Blood Alcohol level:  Lab Results  Component Value Date   [facility] <10 03/09/2023   ETH <10 10/05/2022    Metabolic Disorder Labs: Lab Results  Component Value Date   HGBA1C 5.7 (H) 10/07/2022   MPG 117 10/07/2022   MPG 114 10/05/2022   Lab Results  Component Value Date   PROLACTIN 17.4 10/05/2022   PROLACTIN 45.8 (H) 12/19/2017   Lab Results  Component Value Date   CHOL 156 10/05/2022   TRIG 116 10/05/2022   HDL 40 (L) 10/05/2022   CHOLHDL 3.9 10/05/2022   VLDL 23 10/05/2022   LDLCALC 93  10/05/2022   LDLCALC 69 11/21/2020    Physical Findings: AIMS: Facial and Oral Movements Muscles of Facial Expression: None, normal Lips and Perioral Area: None, normal Jaw: None, normal Tongue: None, normal,Extremity Movements Upper (arms, wrists, hands, fingers): None, normal Lower (legs, knees, ankles, toes): None, normal, Trunk Movements Neck, shoulders, hips: None, normal, Overall Severity Severity of abnormal movements (highest score from questions above): None, normal Incapacitation due to abnormal movements: None, normal Patient's awareness of abnormal movements (rate only patient's report): No Awareness, Dental Status Current problems with teeth and/or dentures?: No Does patient usually wear dentures?: No  CIWA:    COWS:     Musculoskeletal: Strength & Muscle Tone: within normal limits Gait & Station: normal Patient leans: N/A  Psychiatric Specialty Exam:  Presentation  General Appearance:  Casual  Eye Contact: Fair  Speech: Clear and Coherent; Normal Rate  Speech Volume: Normal  Handedness: Right   Mood and Affect  Mood: Dysphoric  Affect: Constricted   Thought Process  Thought Processes: Linear  Descriptions of Associations:Intact  Orientation:Full (Time, Place and Person)  Thought Content:Rumination  History of Schizophrenia/Schizoaffective disorder:Yes  Duration of Psychotic Symptoms:Greater than six months  Hallucinations:Hallucinations: None    Ideas of Reference:None  Suicidal Thoughts:Suicidal Thoughts: No    Homicidal Thoughts:Homicidal Thoughts: No     Sensorium  Memory: Immediate Fair  Judgment: Intact (some improvement)  Insight: Present (some improvement)   Executive Functions  Concentration: Fair  Attention Span: Fair  Recall: Fair  Fund of Knowledge: Fair  Language: Good   Psychomotor Activity  Psychomotor Activity:Psychomotor Activity: Normal     Assets  Assets: Resilience;  Physical Health   Sleep  Sleep:Sleep: Good      Physical Exam: Physical Exam Vitals and nursing note reviewed.  Constitutional:      General: He is not in acute distress.    Appearance: Normal appearance. He is normal weight. He is not ill-appearing or toxic-appearing.  HENT:     Head: Normocephalic and atraumatic.  Pulmonary:     Effort: Pulmonary effort is normal.  Musculoskeletal:        General: Normal range of motion.  Neurological:     General: No focal deficit present.     Mental Status: He is alert.    Review of Systems  Respiratory:  Negative for cough and shortness of breath.   Cardiovascular:  Negative for chest pain.  Gastrointestinal:  Negative for abdominal pain, constipation, diarrhea, nausea and vomiting.  Neurological:  Negative for dizziness, weakness and headaches.  Psychiatric/Behavioral:  Negative for depression, hallucinations and suicidal ideas. The patient is not nervous/anxious.    Blood pressure 131/78, pulse 85, temperature (!) 97.5  F (36.4 C), temperature source Oral, resp. rate 16, height 5\' 3"  (1.6 m), weight 70 kg, SpO2 98%. Body mass index is 27.35 kg/m.   Treatment Plan Summary: Daily contact with patient to assess and evaluate symptoms and progress in treatment and Medication management  [name] [name] is a 42 yr old male who presented on 9/3 to [facility] with delusions and paranoia, he was admitted to [facility] on 9/4.  PPHx is significant for Schizoaffective Disorder, Bipolar Type, Anxiety, Depression, and Polysubstance Abuse (Cocaine, Meth, EtOH, and an extensive history of aggression, and 6 Prior Psychiatric Hospitalizations (last [facility]- 10/2022), and no history of Suicide Attempts or Self Injurious Behavior. He does have an ACT Team- [facility].    [name] [name] is showing some improvement as he is not as intrusive nor hypersexual as he has been.  We will ask his ACT team to come and observe him as he may be near baseline.  We will not make any  changes to his medications at this time.  We will continue to monitor.   Schizoaffective Disorder, Bipolar Type: -Continue Haldol 10 mg TID for psychosis and mood stability -Continue Zyprexa 10 mg AM and 20 mg QHS for psychosis and mood stability -Continue Depakene 1000 mg BID for mood stability. -Continue Cogentin 1 mg BID for drug induced EPS -Continue Agitation Protocol: Zyprexa/Ativan, Zyprexa.Benadryl, and Benadryl -Continue 1:1 -Referral sent to [facility]   Nicotine Dependence: -Continue Nicotine Patch 14 mg daily   -Continue Restoril 15 mg QHS for insomnia -Continue Gabapentin 300 mg TID  -Continue Trazodone 300 mg QHS for sleep -Continue PRN's: Tylenol, Maalox, Atarax, Milk of Magnesia   Lauro Franklin, MD 04/01/2023, 12:20 PM

## 2023-04-01 NOTE — [facility] Group Notes (Signed)
[facility] Group Notes:  (Nursing/MHT/Case Management/Adjunct)  Date:  04/01/2023  Time:  8:45 PM  Type of Therapy:  Psychoeducational Skills  Participation Level:  Minimal  Participation Quality:  Intrusive and Monopolizing  Affect:  Flat  Cognitive:  Lacking  Insight:  Lacking  Engagement in Group:  Lacking, Monopolizing, and Off Topic  Modes of Intervention:  Education  Summary of Progress/Problems:The patient rated his day as a 6 or 7 out of 10 but did not go into detail about his day. He made a number of inappropriate remarks, one pertaining to his penis. He was redirected on a number of occasions.   Hazle Coca S 04/01/2023, 8:45 PM

## 2023-04-02 ENCOUNTER — Encounter (HOSPITAL_COMMUNITY): Payer: Self-pay

## 2023-04-02 DIAGNOSIS — F250 Schizoaffective disorder, bipolar type: Secondary | ICD-10-CM | POA: Diagnosis not present

## 2023-04-02 LAB — VALPROIC ACID LEVEL: Valproic Acid Lvl: 73 ug/mL (ref 50.0–100.0)

## 2023-04-02 NOTE — Progress Notes (Signed)
   04/02/23 2130  Psych Admission Type (Psych Patients Only)  Admission Status Involuntary  Psychosocial Assessment  Patient Complaints None  Eye Contact Fair  Facial Expression Anxious;Sad  Affect Blunted  Speech Tangential  Interaction Assertive  Motor Activity Slow  Appearance/Hygiene Layered clothes  Behavior Characteristics Cooperative  Mood Preoccupied  Aggressive Behavior  Effect No apparent injury  Thought Process  Coherency Disorganized  Content Preoccupation;Delusions  Delusions Grandeur  Perception Derealization  Hallucination None reported or observed  Judgment Poor  Confusion None  Danger to Self  Current suicidal ideation? Denies  Danger to Others  Danger to Others None reported or observed   Pt complained that the medications were making him sick and he "threw up" , Clinical research associate had no evidence of this and pt was informed to notify staff so we could see, if he has another episode of vomiting , so we could verify

## 2023-04-02 NOTE — Progress Notes (Signed)
[facility] MD Progress Note  04/02/2023 2:01 PM [name] [name]  MRN:  [id number] Subjective:   [name] [name] is a 42 yr old male who presented on 9/3 to [facility] with delusions and paranoia, he was admitted to [facility] on 9/4.  PPHx is significant for Schizoaffective Disorder, Bipolar Type, Anxiety, Depression, and Polysubstance Abuse (Cocaine, Meth, EtOH, and an extensive history of aggression, and 6 Prior Psychiatric Hospitalizations (last [facility]- 10/2022), and no history of Suicide Attempts or Self Injurious Behavior. He does have an ACT Team- [facility].   Case was discussed in the multidisciplinary team. MAR was reviewed and patient was compliant with medications.  He received PRN Hydroxyzine yesterday.  He seems to be getting 1 dose every day.  However staff reports no behavioral problems.  He is much improved and continues to cooperate.  He is off the one-on-one observation.   Psychiatric Team made the following recommendations yesterday: -Continue Haldol 10 mg TID for psychosis and mood stability -Continue Zyprexa 10 mg AM and 20 mg QHS for psychosis and mood stability -Change Depakote DR to Depakene 1000 mg BID for mood stability.  -Continue Cogentin 1 mg BID for drug induced EPS -Continue 1:1 -Continue Restoril 15 mg QHS     On interview today patient reports he slept good last night.  He reports his appetite is doing good.  He reports no SI, HI, or AVH.  He reports no Paranoia or Ideas of Reference.  He reports no issues with his medications.  He denies any major issues and reports that he can actually walk to the house.  He is hoping for discharge.  The pact team came to see him yesterday however, with still do not have the assessment.    The plan is to continue to keep him on the waiting list for state hospital and at the same time continue to monitor his progress to see if he can have a trial discharged home if he continues to maintain his improvement.    Principal Problem: Schizoaffective  disorder, bipolar type (HCC) Diagnosis: Principal Problem:   Schizoaffective disorder, bipolar type (HCC) Active Problems:   Substance-induced psychotic disorder (HCC)  Total Time spent with patient:  I personally spent 35 minutes on the unit in direct patient care. The direct patient care time included face-to-face time with the patient, reviewing the patient's chart, communicating with other professionals, and coordinating care. Greater than 50% of this time was spent in counseling or coordinating care with the patient regarding goals of hospitalization, psycho-education, and discharge planning needs.   Past Psychiatric History: Schizoaffective Disorder, Bipolar Type, Anxiety, Depression, and Polysubstance Abuse (Cocaine, Meth, EtOH, and an extensive history of aggression, and 6 Prior Psychiatric Hospitalizations (last [facility]- 10/2022), and no history of Suicide Attempts or Self Injurious Behavior.  He does have an ACT Team- [facility].  Past Medical History:  Past Medical History:  Diagnosis Date   Anxiety    Depression    Mental disorder    Schizophrenia (HCC)    History reviewed. No pertinent surgical history. Family History:  Family History  Problem Relation Age of Onset   Mental illness Neg Hx    Family Psychiatric  History: Reports None 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, Pt  uses meth    Social History   Socioeconomic History   Marital status: Married    Spouse name: Not on file   Number of children: 1   Years of education: Not on file   Highest education level: Not on file  Occupational History   Occupation: Disabled  Tobacco Use   Smoking status: Every Day    Current packs/day: 1.00    Types: Cigarettes   Smokeless tobacco: Former   Building services engineer status: Unknown  Substance and Sexual Activity   Alcohol use: Not Currently    Comment: haven't drank in months"   Drug use: Yes    Types: Marijuana, Methamphetamines    Comment: 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 Pt lives with mother who IVC'd him.      Pt lives with parents and sister.  He is married, but currently separated.  Pt is on disability.   Social Determinants of Health   Financial Resource Strain: Not on file  Food Insecurity: Patient Unable To Answer (03/09/2023)   Hunger Vital Sign    Worried About Running Out of Food in the Last Year: Patient unable to answer    Ran Out of Food in the Last Year: Patient unable to answer  Transportation Needs: Patient Unable To Answer (03/09/2023)   PRAPARE - Administrator, Civil Service (Medical): Patient unable to answer    Lack of Transportation (Non-Medical): Patient unable to answer  Physical Activity: Not on file  Stress: Not on file  Social Connections: Not on file   Additional Social History:                         Sleep: Good   Appetite:  Good  Current Medications: Current Facility-Administered Medications  Medication Dose Route Frequency Provider Last Rate Last Admin   acetaminophen (TYLENOL) tablet 650 mg  650 mg Oral Q6H PRN Ardis Hughs, NP   650 mg at 03/22/23 2049   alum & mag hydroxide-simeth (MAALOX/MYLANTA) 200-200-20 MG/5ML suspension 30 mL  30 mL Oral Q4H PRN Ardis Hughs, NP       benztropine (COGENTIN) tablet 1 mg  1 mg Oral BID Abbott Pao, Nadir, MD   1 mg at 04/02/23 0838   diphenhydrAMINE (BENADRYL) capsule 50 mg  50 mg Oral TID PRN Armandina Stammer I, NP   50 mg at 03/26/23 0311   OLANZapine (ZYPREXA) injection 5 mg  5 mg Intramuscular TID PRN Bobbitt, Shalon E, NP   5 mg at 03/25/23 1350    And   diphenhydrAMINE (BENADRYL) injection 50 mg  50 mg Intramuscular TID PRN Bobbitt, Shalon E, NP   50 mg at 03/25/23 1350   gabapentin (NEURONTIN) capsule 300 mg  300 mg Oral TID Armandina Stammer I, NP   300 mg at 04/02/23 1342   haloperidol (HALDOL) 2 MG/ML solution 10 mg  10 mg Oral TID Sarita Bottom, MD   10 mg at 04/02/23 1342   hydrOXYzine (ATARAX) tablet 50 mg  50 mg Oral Q4H PRN Armandina Stammer I, NP   50 mg at 04/01/23 2046   OLANZapine (ZYPREXA) tablet 5 mg  5 mg Oral TID PRN Armandina Stammer I, NP   5 mg at 03/26/23 1610   And  LORazepam (ATIVAN) tablet 1 mg  1 mg Oral TID PRN Armandina Stammer I, NP   1 mg at 03/26/23 0311   magnesium hydroxide (MILK OF MAGNESIA) suspension 30 mL  30 mL Oral Daily PRN Ardis Hughs, NP       nicotine (NICODERM CQ - dosed in mg/24 hours) patch 14 mg  14 mg Transdermal Daily Nkwenti, Tyler Aas, NP   14 mg at 03/30/23 0808   OLANZapine zydis (ZYPREXA) disintegrating tablet 10 mg  10 mg Oral Daily Lauro Franklin, MD   10 mg at 04/02/23 0837   OLANZapine zydis (ZYPREXA) disintegrating tablet 20 mg  20 mg Oral QHS Lauro Franklin, MD   20 mg at 04/01/23 2046   temazepam (RESTORIL) capsule 15 mg  15 mg Oral QHS Lauro Franklin, MD   15 mg at 04/01/23 2046   traZODone (DESYREL) tablet 300 mg  300 mg Oral QHS Armandina Stammer I, NP   300 mg at 04/01/23 2046   valproic acid (DEPAKENE) 250 MG/5ML solution 1,000 mg  1,000 mg Oral BID Lauro Franklin, MD   1,000 mg at 04/02/23 0840    Lab Results:  Results for orders placed or performed during the hospital encounter of 03/09/23 (from the past 48 hour(s))  Valproic acid level     Status: None   Collection Time: 04/02/23  6:41 AM  Result Value Ref Range   Valproic Acid Lvl 73 50.0 - 100.0 ug/mL    Comment: Performed at [facility], [address] [place], [place] [address]     Blood Alcohol level:  Lab Results  Component Value Date   [facility] <10 03/09/2023   ETH <10 10/05/2022     Metabolic Disorder Labs: Lab Results  Component Value Date   HGBA1C 5.7 (H) 10/07/2022   MPG 117 10/07/2022   MPG 114 10/05/2022   Lab Results  Component Value Date   PROLACTIN 17.4 10/05/2022   PROLACTIN 45.8 (H) 12/19/2017   Lab Results  Component Value Date   CHOL 156 10/05/2022   TRIG 116 10/05/2022   HDL 40 (L) 10/05/2022   CHOLHDL 3.9 10/05/2022   VLDL 23 10/05/2022   LDLCALC 93 10/05/2022   LDLCALC 69 11/21/2020    Physical Findings: AIMS: Facial and Oral Movements Muscles of Facial Expression: None, normal Lips and Perioral Area: None, normal Jaw: None, normal Tongue: None, normal,Extremity Movements Upper (arms, wrists, hands, fingers): None, normal Lower (legs, knees, ankles, toes): None, normal, Trunk Movements Neck, shoulders, hips: None, normal, Overall Severity Severity of abnormal movements (highest score from questions above): None, normal Incapacitation due to abnormal movements: None, normal Patient's awareness of abnormal movements (rate only patient's report): No Awareness, Dental Status Current problems with teeth and/or dentures?: No Does patient usually wear dentures?: No  CIWA:    COWS:     Musculoskeletal: Strength & Muscle Tone: within normal limits Gait & Station: normal Patient leans: N/A  Psychiatric Specialty Exam:  Presentation  General Appearance:  Appropriate for Environment  Eye Contact: Fair  Speech: Clear and Coherent  Speech Volume: Normal  Handedness: Right   Mood and Affect  Mood: Dysphoric  Affect: Blunt   Thought Process  Thought Processes: Linear  Descriptions of Associations:Tangential  Orientation:Full (Time, Place and Person)  Thought Content:Perseveration; Rumination  History of Schizophrenia/Schizoaffective disorder:Yes  Duration of Psychotic Symptoms:Greater than six months  Hallucinations:Hallucinations: None    Ideas of Reference:None  Suicidal Thoughts:Suicidal  Thoughts: No    Homicidal  Thoughts:Homicidal Thoughts: No     Sensorium  Memory: Immediate Fair; Recent Fair; Remote Fair  Judgment: Fair  Insight: Fair   Chartered certified accountant: Fair  Attention Span: Fair  Recall: [employer] of Knowledge: Fair  Language: Fair   Psychomotor Activity  Psychomotor Activity:Psychomotor Activity: Normal     Assets  Assets: Desire for Improvement; Housing; Resilience   Sleep  Sleep:Sleep: Good      Physical Exam: Physical Exam Vitals and nursing note reviewed.  Constitutional:      General: He is not in acute distress.    Appearance: Normal appearance. He is normal weight. He is not ill-appearing or toxic-appearing.  HENT:     Head: Normocephalic and atraumatic.  Pulmonary:     Effort: Pulmonary effort is normal.  Musculoskeletal:        General: Normal range of motion.  Neurological:     General: No focal deficit present.     Mental Status: He is alert.    Review of Systems  Respiratory:  Negative for cough and shortness of breath.   Cardiovascular:  Negative for chest pain.  Gastrointestinal:  Negative for abdominal pain, constipation, diarrhea, nausea and vomiting.  Neurological:  Negative for dizziness, weakness and headaches.  Psychiatric/Behavioral:  Negative for depression, hallucinations and suicidal ideas. The patient is not nervous/anxious.    Blood pressure 125/76, pulse 78, temperature 98 F (36.7 C), temperature source Oral, resp. rate 16, height 5\' 3"  (1.6 m), weight 70 kg, SpO2 98%. Body mass index is 27.35 kg/m.   Treatment Plan Summary: Daily contact with patient to assess and evaluate symptoms and progress in treatment and Medication management  Jmarion Christiano is a 42 yr old male who presented on 9/3 to [facility] with delusions and paranoia, he was admitted to [facility] on 9/4.  PPHx is significant for Schizoaffective Disorder, Bipolar Type, Anxiety, Depression, and Polysubstance Abuse  (Cocaine, Meth, EtOH, and an extensive history of aggression, and 6 Prior Psychiatric Hospitalizations (last [facility]- 10/2022), and no history of Suicide Attempts or Self Injurious Behavior. He does have an ACT Team- [facility].    Damyon is showing some improvement as he is not as intrusive or inappropriate.  The ACT team has, in seeing him yesterday and we are waiting there assessment.  We will continue to monitor.     Schizoaffective Disorder, Bipolar Type: -Continue Haldol 10 mg TID for psychosis and mood stability -Continue Zyprexa 10 mg AM and 20 mg QHS for psychosis and mood stability -Continue Depakene 1000 mg BID for mood stability. -Continue Cogentin 1 mg BID for drug induced EPS -Continue Agitation Protocol: Zyprexa/Ativan, Zyprexa.Benadryl, and Benadryl -Continue 1:1 -Referral sent to [facility]   Nicotine Dependence: -Continue Nicotine Patch 14 mg daily   -Continue Restoril 15 mg QHS for insomnia -Continue Gabapentin 300 mg TID  -Continue Trazodone 300 mg QHS for sleep -Continue PRN's: Tylenol, Maalox, Atarax, Milk of Magnesia   Rex Kras, MD 04/02/2023, 2:01 PM Patient ID: [name], male   DOB: [birth year], 42 y.o.   MRN: [id number]

## 2023-04-02 NOTE — Plan of Care (Signed)

## 2023-04-02 NOTE — Group Note (Signed)
Recreation Therapy Group Note   Group Topic:Problem Solving  Group Date: 04/02/2023 Start Time: 1040 End Time: 1105 Facilitators: Azhar Yogi-McCall, LRT,CTRS Location: [address] Dayroom   Group Topic: Communication, Team Building, Problem Solving  Goal Area(s) Addresses:  Patient will effectively work with peer towards shared goal.  Patient will identify skills used to make activity successful.  Patient will share challenges and verbalize solution-driven approaches used. Patient will identify how skills used during activity can be used to reach post d/c goals.   Intervention: STEM Activity   Group Description: [employer]. Patients were provided the following materials: 5 drinking straws, 5 rubber bands, 5 paper clips, 2 index cards, 2 drinking cups, and 2 toilet paper rolls. Using the provided materials patients were asked to build a launching mechanism to launch a ping pong ball across the room, approximately 10 feet. Patients were divided into teams of 3-5. Instructions required all materials be incorporated into the device, functionality of items left to the peer group's discretion.  Education: Pharmacist, community, Scientist, physiological, Air cabin crew, Building control surveyor.   Education Outcome: Acknowledges education/In group clarification offered/Needs additional education.   Affect/Mood: N/A   Participation Level: Did not attend    Clinical Observations/Individualized Feedback:     Plan: Continue to engage patient in RT group sessions 2-3x/week.   Ann Groeneveld-McCall, LRT,CTRS 04/02/2023 1:07 PM

## 2023-04-02 NOTE — Plan of Care (Signed)
  Problem: Education: Goal: Knowledge of [facility] Education information/materials will improve Outcome: Progressing Goal: Emotional status will improve Outcome: Progressing   Problem: Coping: Goal: Ability to demonstrate self-control will improve Outcome: Progressing   Problem: Safety: Goal: Periods of time without injury will increase Outcome: Progressing

## 2023-04-02 NOTE — [facility] Group Notes (Signed)

## 2023-04-02 NOTE — Group Note (Signed)
Date:  04/02/2023 Time:  8:59 PM  Group Topic/Focus:  Wrap-Up Group:   The focus of this group is to help patients review their daily goal of treatment and discuss progress on daily workbooks.    Participation Level:  Active  Participation Quality:  Appropriate  Affect:  Appropriate  Cognitive:  Appropriate  Insight: Appropriate  Engagement in Group:  Engaged  Modes of Intervention:  Education and Exploration  Additional Comments:  Patient attended and participated in group tonight. He reports that the most important thing that happened for him today was the doctor telling him that he may be leaving on Monday.  Lita Mains [facility] 04/02/2023, 8:59 PM

## 2023-04-02 NOTE — BH IP Treatment Plan (Signed)
Interdisciplinary Treatment and Diagnostic Plan Update  04/02/2023 Time of Session: 9:30 AM (UPDATE)  [name] [name] MRN: [id number]  Principal Diagnosis: Schizoaffective disorder, bipolar type (HCC)  Secondary Diagnoses: Principal Problem:   Schizoaffective disorder, bipolar type (HCC) Active Problems:   Substance-induced psychotic disorder (HCC)   Current Medications:  Current Facility-Administered Medications  Medication Dose Route Frequency Provider Last Rate Last Admin   acetaminophen (TYLENOL) tablet 650 mg  650 mg Oral Q6H PRN Ardis Hughs, NP   650 mg at 03/22/23 2049   alum & mag hydroxide-simeth (MAALOX/MYLANTA) 200-200-20 MG/5ML suspension 30 mL  30 mL Oral Q4H PRN Ardis Hughs, NP       benztropine (COGENTIN) tablet 1 mg  1 mg Oral BID Abbott Pao, Nadir, MD   1 mg at 04/02/23 0838   diphenhydrAMINE (BENADRYL) capsule 50 mg  50 mg Oral TID PRN Armandina Stammer I, NP   50 mg at 03/26/23 0311   OLANZapine (ZYPREXA) injection 5 mg  5 mg Intramuscular TID PRN Bobbitt, Shalon E, NP   5 mg at 03/25/23 1350   And   diphenhydrAMINE (BENADRYL) injection 50 mg  50 mg Intramuscular TID PRN Bobbitt, Shalon E, NP   50 mg at 03/25/23 1350   gabapentin (NEURONTIN) capsule 300 mg  300 mg Oral TID Armandina Stammer I, NP   300 mg at 04/02/23 1342   haloperidol (HALDOL) 2 MG/ML solution 10 mg  10 mg Oral TID Sarita Bottom, MD   10 mg at 04/02/23 1342   hydrOXYzine (ATARAX) tablet 50 mg  50 mg Oral Q4H PRN Armandina Stammer I, NP   50 mg at 04/01/23 2046   OLANZapine (ZYPREXA) tablet 5 mg  5 mg Oral TID PRN Armandina Stammer I, NP   5 mg at 03/26/23 7829   And   LORazepam (ATIVAN) tablet 1 mg  1 mg Oral TID PRN Armandina Stammer I, NP   1 mg at 03/26/23 0311   magnesium hydroxide (MILK OF MAGNESIA) suspension 30 mL  30 mL Oral Daily PRN Ardis Hughs, NP       nicotine (NICODERM CQ - dosed in mg/24 hours) patch 14 mg  14 mg Transdermal Daily Nkwenti, Tyler Aas, NP   14 mg at 03/30/23 0808   OLANZapine zydis  (ZYPREXA) disintegrating tablet 10 mg  10 mg Oral Daily Lauro Franklin, MD   10 mg at 04/02/23 0837   OLANZapine zydis (ZYPREXA) disintegrating tablet 20 mg  20 mg Oral QHS Lauro Franklin, MD   20 mg at 04/01/23 2046   temazepam (RESTORIL) capsule 15 mg  15 mg Oral QHS Lauro Franklin, MD   15 mg at 04/01/23 2046   traZODone (DESYREL) tablet 300 mg  300 mg Oral QHS Armandina Stammer I, NP   300 mg at 04/01/23 2046   valproic acid (DEPAKENE) 250 MG/5ML solution 1,000 mg  1,000 mg Oral BID Lauro Franklin, MD   1,000 mg at 04/02/23 0840   PTA Medications: Medications Prior to Admission  Medication Sig Dispense Refill Last Dose   INVEGA TRINZA 546 MG/1.75ML injection Inject 546 mg into the muscle every 3 (three) months. (Patient not taking: Reported on 03/09/2023)      lithium 300 MG tablet Take 300 mg by mouth daily. (Patient not taking: Reported on 03/09/2023)      OLANZapine (ZYPREXA) 20 MG tablet Take 1 tablet (20 mg total) by mouth at bedtime. For mood control (Patient not taking: Reported on 03/09/2023) 30 tablet 0  OLANZapine (ZYPREXA) 5 MG tablet Take 1 tablet (5 mg total) by mouth 2 (two) times daily.       Patient Stressors: Other: don't want to be here.    Patient Strengths: Supportive family/friends   Treatment Modalities: Medication Management, Group therapy, Case management,  1 to 1 session with clinician, Psychoeducation, Recreational therapy.   Physician Treatment Plan for Primary Diagnosis: Schizoaffective disorder, bipolar type (HCC) Long Term Goal(s): Improvement in symptoms so as ready for discharge   Short Term Goals: Ability to identify and develop effective coping behaviors will improve Ability to maintain clinical measurements within normal limits will improve Compliance with prescribed medications will improve Ability to identify triggers associated with substance abuse/mental health issues will improve Ability to identify changes in lifestyle to  reduce recurrence of condition will improve Ability to verbalize feelings will improve Ability to disclose and discuss suicidal ideas Ability to demonstrate self-control will improve  Medication Management: Evaluate patient's response, side effects, and tolerance of medication regimen.  Therapeutic Interventions: 1 to 1 sessions, Unit Group sessions and Medication administration.  Evaluation of Outcomes: Progressing  Physician Treatment Plan for Secondary Diagnosis: Principal Problem:   Schizoaffective disorder, bipolar type (HCC) Active Problems:   Substance-induced psychotic disorder (HCC)  Long Term Goal(s): Improvement in symptoms so as ready for discharge   Short Term Goals: Ability to identify and develop effective coping behaviors will improve Ability to maintain clinical measurements within normal limits will improve Compliance with prescribed medications will improve Ability to identify triggers associated with substance abuse/mental health issues will improve Ability to identify changes in lifestyle to reduce recurrence of condition will improve Ability to verbalize feelings will improve Ability to disclose and discuss suicidal ideas Ability to demonstrate self-control will improve     Medication Management: Evaluate patient's response, side effects, and tolerance of medication regimen.  Therapeutic Interventions: 1 to 1 sessions, Unit Group sessions and Medication administration.  Evaluation of Outcomes: Progressing   RN Treatment Plan for Primary Diagnosis: Schizoaffective disorder, bipolar type (HCC) Long Term Goal(s): Knowledge of disease and therapeutic regimen to maintain health will improve  Short Term Goals: Ability to remain free from injury will improve, Ability to verbalize frustration and anger appropriately will improve, Ability to participate in decision making will improve, Ability to verbalize feelings will improve, Ability to identify and develop  effective coping behaviors will improve, and Compliance with prescribed medications will improve  Medication Management: RN will administer medications as ordered by provider, will assess and evaluate patient's response and provide education to patient for prescribed medication. RN will report any adverse and/or side effects to prescribing provider.  Therapeutic Interventions: 1 on 1 counseling sessions, Psychoeducation, Medication administration, Evaluate responses to treatment, Monitor vital signs and CBGs as ordered, Perform/monitor CIWA, COWS, AIMS and Fall Risk screenings as ordered, Perform wound care treatments as ordered.  Evaluation of Outcomes: Progressing   LCSW Treatment Plan for Primary Diagnosis: Schizoaffective disorder, bipolar type (HCC) Long Term Goal(s): Safe transition to appropriate next level of care at discharge, Engage patient in therapeutic group addressing interpersonal concerns.  Short Term Goals: Engage patient in aftercare planning with referrals and resources, Increase social support, Increase emotional regulation, Facilitate acceptance of mental health diagnosis and concerns, Identify triggers associated with mental health/substance abuse issues, and Increase skills for wellness and recovery  Therapeutic Interventions: Assess for all discharge needs, 1 to 1 time with Social worker, Explore available resources and support systems, Assess for adequacy in community support network, Educate family and  significant other(s) on suicide prevention, Complete Psychosocial Assessment, Interpersonal group therapy.  Evaluation of Outcomes: Progressing   Progress in Treatment: Attending groups: Yes. Participating in groups: Yes. Taking medication as prescribed: Yes. Toleration medication: Yes. Family/Significant other contact made: No, will contact:  Pt is disorganized and cannot provide consent Patient understands diagnosis: No. Discussing patient identified problems/goals  with staff: Yes. Medical problems stabilized or resolved: Yes. Denies suicidal/homicidal ideation: Yes. Issues/concerns per patient self-inventory: Yes. Other: N/A   New problem(s) identified: No, Describe:  none reported   New Short Term/Long Term Goal(s): medication stabilization, elimination of SI thoughts, development of comprehensive mental wellness plan.    Patient Goals:  None reported   Discharge Plan or Barriers: CSW will continue to follow and assess for appropriate referrals and possible discharge planning.    Reason for Continuation of Hospitalization: Delusions  Hallucinations Medication stabilization Withdrawal symptoms   Estimated Length of Stay: 3-4 Days  Last 3 [place] Suicide Severity Risk Score: Flowsheet Row [facility] (Current[facility] Most recent reading at 03/09/2023  2:00 PM ED from 03/09/2023 in [facility] Most recent reading at 03/09/2023  9:44 AM ED from 01/13/2023 in [facility] Most recent reading at 01/13/2023  2:16 AM  C-SSRS RISK CATEGORY No Risk No Risk No Risk       Last PHQ 2/9 Scores:     No data to display          Scribe for Treatment Team: [name] 04/02/2023 3:38 PM

## 2023-04-02 NOTE — Group Note (Signed)
Date:  04/02/2023 Time:  10:06 AM  Group Topic/Focus:  Goals Group:   The focus of this group is to help patients establish daily goals to achieve during treatment and discuss how the patient can incorporate goal setting into their daily lives to aide in recovery.    Participation Level:  Active  Additional Comments:  Pt has a goal of working towards discharge and to speak with the MD's.  Deforest Hoyles Nereida Schepp 04/02/2023, 10:06 AM

## 2023-04-03 DIAGNOSIS — F250 Schizoaffective disorder, bipolar type: Secondary | ICD-10-CM | POA: Diagnosis not present

## 2023-04-03 NOTE — Progress Notes (Signed)
   04/03/23 2300  Psych Admission Type (Psych Patients Only)  Admission Status Involuntary  Psychosocial Assessment  Patient Complaints None  Eye Contact Fair  Facial Expression Anxious  Affect Preoccupied  Speech Tangential  Interaction Assertive  Motor Activity Slow  Appearance/Hygiene Unremarkable  Behavior Characteristics Cooperative;Appropriate to situation  Mood Preoccupied  Thought Process  Coherency Disorganized  Content Preoccupation;Delusions  Delusions Grandeur  Perception Derealization  Hallucination None reported or observed  Judgment Poor  Confusion None  Danger to Self  Current suicidal ideation? Denies  Danger to Others  Danger to Others None reported or observed

## 2023-04-03 NOTE — Progress Notes (Signed)
[facility] MD Progress Note  04/03/2023 11:56 AM [name] [name]  MRN:  [id number] Subjective:   [name] [name] is a 42 yr old male who presented on 9/3 to [facility] with delusions and paranoia, he was admitted to [facility] on 9/4.  PPHx is significant for Schizoaffective Disorder, Bipolar Type, Anxiety, Depression, and Polysubstance Abuse (Cocaine, Meth, EtOH, and an extensive history of aggression, and 6 Prior Psychiatric Hospitalizations (last [facility]- 10/2022), and no history of Suicide Attempts or Self Injurious Behavior. He does have an ACT Team- [facility].   Case was discussed in the multidisciplinary team. MAR was reviewed and patient was compliant with medications.  Staff reports no behavioral problems.  He continues to cooperate.  He is currently not on one-on-one observation and is going to the cafeteria and is socially appropriate.  No PRNs were received.  No agitation noted.  Psychiatric Team made the following recommendations yesterday: -Continue Haldol 10 mg TID for psychosis and mood stability -Continue Zyprexa 10 mg AM and 20 mg QHS for psychosis and mood stability -Change Depakote DR to Depakene 1000 mg BID for mood stability.  -Continue Cogentin 1 mg BID for drug induced EPS -Continue 1:1 -Continue Restoril 15 mg QHS   On interview today patient reports he slept good last night.  He reports his appetite is doing good.  He reports no SI, HI, or AVH.  He reports no Paranoia or Ideas of Reference.  He reports no issues with his medications. He continues to be pleasant and cooperative and seems to be maintaining his improvement over the last 48 hours.  We will continue to monitor him over the weekend.  He is hoping to be able to go with the support of ACT team to his own house.  The plan is to continue to keep him on the waiting list for state hospital and at the same time continue to monitor his progress to see if he can have a trial discharged home if he continues to maintain his  improvement.    Principal Problem: Schizoaffective disorder, bipolar type (HCC) Diagnosis: Principal Problem:   Schizoaffective disorder, bipolar type (HCC) Active Problems:   Substance-induced psychotic disorder (HCC)  Total Time spent with patient:  I personally spent 35 minutes on the unit in direct patient care. The direct patient care time included face-to-face time with the patient, reviewing the patient's chart, communicating with other professionals, and coordinating care. Greater than 50% of this time was spent in counseling or coordinating care with the patient regarding goals of hospitalization, psycho-education, and discharge planning needs.   Past Psychiatric History: Schizoaffective Disorder, Bipolar Type, Anxiety, Depression, and Polysubstance Abuse (Cocaine, Meth, EtOH, and an extensive history of aggression, and 6 Prior Psychiatric Hospitalizations (last [facility]- 10/2022), and no history of Suicide Attempts or Self Injurious Behavior.  He does have an ACT Team- [facility].  Past Medical History:  Past Medical History:  Diagnosis Date   Anxiety    Depression    Mental disorder    Schizophrenia (HCC)    History reviewed. No pertinent surgical history. Family History:  Family History  Problem Relation Age of Onset   Mental illness Neg Hx    Family Psychiatric  History: Reports None Social History:  Social History   Substance and Sexual Activity  Alcohol Use Not Currently   Comment: haven't drank in months"     Social History   Substance and Sexual Activity  Drug Use Yes   Types: Marijuana, Methamphetamines   Comment:  Per sister, Pt uses meth    Social History   Socioeconomic History   Marital status: Married    Spouse name: Not on file   Number of children: 1   Years of education: Not on file   Highest education level: Not on file  Occupational History   Occupation: Disabled  Tobacco Use   Smoking status: Every Day    Current packs/day: 1.00     Types: Cigarettes   Smokeless tobacco: Former  Building services engineer status: Unknown  Substance and Sexual Activity   Alcohol use: Not Currently    Comment: haven't drank in months"   Drug use: Yes    Types: Marijuana, Methamphetamines    Comment: 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 Pt lives with mother who IVC'd him.      Pt lives with parents and sister.  He is married, but currently separated.  Pt is on disability.   Social Determinants of Health   Financial Resource Strain: Not on file  Food Insecurity: Patient Unable To Answer (03/09/2023)   Hunger Vital Sign    Worried About Running Out of Food in the Last Year: Patient unable to answer    Ran Out of Food in the Last Year: Patient unable to answer  Transportation Needs: Patient Unable To Answer (03/09/2023)   PRAPARE - Administrator, Civil Service (Medical): Patient unable to answer    Lack of Transportation (Non-Medical): Patient unable to answer  Physical Activity: Not on file  Stress: Not on file  Social Connections: Not on file   Additional Social History:                         Sleep: Good   Appetite:  Good  Current Medications: Current Facility-Administered Medications  Medication Dose Route Frequency Provider Last Rate Last Admin   acetaminophen (TYLENOL) tablet 650 mg  650 mg Oral Q6H PRN Ardis Hughs, NP   650 mg at 03/22/23 2049   alum & mag hydroxide-simeth (MAALOX/MYLANTA) 200-200-20 MG/5ML suspension 30 mL  30 mL Oral Q4H PRN Ardis Hughs, NP       benztropine (COGENTIN) tablet 1 mg  1 mg Oral BID Abbott Pao, Nadir, MD   1 mg at 04/03/23 0841   diphenhydrAMINE (BENADRYL) capsule 50 mg  50 mg Oral TID PRN Armandina Stammer I, NP   50 mg at 03/26/23 0311   OLANZapine (ZYPREXA) injection 5 mg  5 mg Intramuscular TID PRN  Bobbitt, Shalon E, NP   5 mg at 03/25/23 1350   And   diphenhydrAMINE (BENADRYL) injection 50 mg  50 mg Intramuscular TID PRN Bobbitt, Shalon E, NP   50 mg at 03/25/23 1350   gabapentin (NEURONTIN) capsule 300 mg  300 mg Oral TID Armandina Stammer I, NP   300 mg at 04/03/23 0841   haloperidol (HALDOL) 2 MG/ML solution 10 mg  10 mg Oral TID Sarita Bottom, MD   10 mg at 04/03/23 0841   hydrOXYzine (ATARAX) tablet 50 mg  50 mg Oral Q4H PRN Armandina Stammer I, NP   50 mg at 04/02/23 2105   OLANZapine (ZYPREXA) tablet 5 mg  5 mg Oral TID PRN Armandina Stammer I, NP   5 mg at 03/26/23 [phone]  And   LORazepam (ATIVAN) tablet 1 mg  1 mg Oral TID PRN Armandina Stammer I, NP   1 mg at 03/26/23 0311   magnesium hydroxide (MILK OF MAGNESIA) suspension 30 mL  30 mL Oral Daily PRN Ardis Hughs, NP       nicotine (NICODERM CQ - dosed in mg/24 hours) patch 14 mg  14 mg Transdermal Daily Nkwenti, Tyler Aas, NP   14 mg at 03/30/23 0808   OLANZapine zydis (ZYPREXA) disintegrating tablet 10 mg  10 mg Oral Daily Lauro Franklin, MD   10 mg at 04/03/23 0841   OLANZapine zydis (ZYPREXA) disintegrating tablet 20 mg  20 mg Oral QHS Lauro Franklin, MD   20 mg at 04/02/23 2106   temazepam (RESTORIL) capsule 15 mg  15 mg Oral QHS Lauro Franklin, MD   15 mg at 04/02/23 2106   traZODone (DESYREL) tablet 300 mg  300 mg Oral QHS Armandina Stammer I, NP   300 mg at 04/02/23 2106   valproic acid (DEPAKENE) 250 MG/5ML solution 1,000 mg  1,000 mg Oral BID Lauro Franklin, MD   1,000 mg at 04/03/23 1610    Lab Results:  Results for orders placed or performed during the hospital encounter of 03/09/23 (from the past 48 hour(s))  Valproic acid level     Status: None   Collection Time: 04/02/23  6:41 AM  Result Value Ref Range   Valproic Acid Lvl 73 50.0 - 100.0 ug/mL    Comment: Performed at [facility], [address], [place], [place] [address]     Blood Alcohol level:  Lab Results  Component Value  Date   [facility] <10 03/09/2023   ETH <10 10/05/2022    Metabolic Disorder Labs: Lab Results  Component Value Date   HGBA1C 5.7 (H) 10/07/2022   MPG 117 10/07/2022   MPG 114 10/05/2022   Lab Results  Component Value Date   PROLACTIN 17.4 10/05/2022   PROLACTIN 45.8 (H) 12/19/2017   Lab Results  Component Value Date   CHOL 156 10/05/2022   TRIG 116 10/05/2022   HDL 40 (L) 10/05/2022   CHOLHDL 3.9 10/05/2022   VLDL 23 10/05/2022   LDLCALC 93 10/05/2022   LDLCALC 69 11/21/2020    Physical Findings: AIMS: Facial and Oral Movements Muscles of Facial Expression: None, normal Lips and Perioral Area: None, normal Jaw: None, normal Tongue: None, normal,Extremity Movements Upper (arms, wrists, hands, fingers): None, normal Lower (legs, knees, ankles, toes): None, normal, Trunk Movements Neck, shoulders, hips: None, normal, Overall Severity Severity of abnormal movements (highest score from questions above): None, normal Incapacitation due to abnormal movements: None, normal Patient's awareness of abnormal movements (rate only patient's report): No Awareness, Dental Status Current problems with teeth and/or dentures?: No Does patient usually wear dentures?: No  CIWA:    COWS:     Musculoskeletal: Strength & Muscle Tone: within normal limits Gait & Station: normal Patient leans: N/A  Psychiatric Specialty Exam:  Presentation  General Appearance:  Appropriate for Environment  Eye Contact: Fair  Speech: Clear and Coherent  Speech Volume: Normal  Handedness: Right   Mood and Affect  Mood: Anxious  Affect: Full Range; Congruent   Thought Process  Thought Processes: Disorganized  Descriptions of Associations:Tangential  Orientation:Full (Time, Place and Person)  Thought Content:Perseveration; Rumination  History of Schizophrenia/Schizoaffective disorder:Yes  Duration of Psychotic Symptoms:Greater than six months  Hallucinations:Hallucinations:  None    Ideas of Reference:None  Suicidal Thoughts:Suicidal Thoughts:  No    Homicidal Thoughts:Homicidal Thoughts: No     Sensorium  Memory: Immediate Fair; Remote Fair; Recent Fair  Judgment: Fair  Insight: Fair   Chartered certified accountant: Fair  Attention Span: Fair  Recall: [employer] of Knowledge: Fair  Language: Fair   Psychomotor Activity  Psychomotor Activity:Psychomotor Activity: Normal     Assets  Assets: Desire for Improvement; Resilience; Housing; Social Support   Sleep  Sleep:Sleep: Good      Physical Exam: Physical Exam Vitals and nursing note reviewed.  Constitutional:      General: He is not in acute distress.    Appearance: Normal appearance. He is normal weight. He is not ill-appearing or toxic-appearing.  HENT:     Head: Normocephalic and atraumatic.  Pulmonary:     Effort: Pulmonary effort is normal.  Musculoskeletal:        General: Normal range of motion.  Neurological:     General: No focal deficit present.     Mental Status: He is alert.    Review of Systems  Respiratory:  Negative for cough and shortness of breath.   Cardiovascular:  Negative for chest pain.  Gastrointestinal:  Negative for abdominal pain, constipation, diarrhea, nausea and vomiting.  Neurological:  Negative for dizziness, weakness and headaches.  Psychiatric/Behavioral:  Negative for depression, hallucinations and suicidal ideas. The patient is not nervous/anxious.    Blood pressure 123/84, pulse 80, temperature 97.9 F (36.6 C), temperature source Oral, resp. rate 18, height 5\' 3"  (1.6 m), weight 70 kg, SpO2 97%. Body mass index is 27.35 kg/m.   Treatment Plan Summary: Daily contact with patient to assess and evaluate symptoms and progress in treatment and Medication management  Itay Mella is a 42 yr old male who presented on 9/3 to [facility] with delusions and paranoia, he was admitted to [facility] on 9/4.  PPHx is significant for  Schizoaffective Disorder, Bipolar Type, Anxiety, Depression, and Polysubstance Abuse (Cocaine, Meth, EtOH, and an extensive history of aggression, and 6 Prior Psychiatric Hospitalizations (last [facility]- 10/2022), and no history of Suicide Attempts or Self Injurious Behavior. He does have an ACT Team- [facility].    Dre is showing some improvement as he is not as intrusive or inappropriate.  The ACT team has, in seeing him yesterday and we are waiting there assessment.  We will continue to monitor.     Schizoaffective Disorder, Bipolar Type: -Continue Haldol 10 mg TID for psychosis and mood stability -Continue Zyprexa 10 mg AM and 20 mg QHS for psychosis and mood stability -Continue Depakene 1000 mg BID for mood stability. -Continue Cogentin 1 mg BID for drug induced EPS -Continue Agitation Protocol: Zyprexa/Ativan, Zyprexa.Benadryl, and Benadryl -Continue 1:1 -Referral sent to [facility]   Nicotine Dependence: -Continue Nicotine Patch 14 mg daily   -Continue Restoril 15 mg QHS for insomnia -Continue Gabapentin 300 mg TID  -Continue Trazodone 300 mg QHS for sleep -Continue PRN's: Tylenol, Maalox, Atarax, Milk of Magnesia   Rex Kras, MD 04/03/2023, 11:56 AM Patient ID: [name], male   DOB: 11/19/1980, 42 y.o.   MRN: [id number] Patient ID: Nolen Lindamood, male   DOB: April 18, 1981, 42 y.o.   MRN: [id number]

## 2023-04-03 NOTE — Group Note (Signed)
Date:  04/03/2023 Time:  8:54 PM  Group Topic/Focus:  Wrap-Up Group:   The focus of this group is to help patients review their daily goal of treatment and discuss progress on daily workbooks.    Participation Level:  Active  Participation Quality:  Appropriate  Affect:  Appropriate  Cognitive:  Appropriate  Insight: Appropriate  Engagement in Group:  Engaged  Modes of Intervention:  Education and Exploration  Additional Comments:  Patient attended and participated in group tonight. He reports that is strength is in his name. He likes that he can make people laugh.  Lita Mains [facility] 04/03/2023, 8:54 PM

## 2023-04-03 NOTE — Progress Notes (Signed)
   04/03/23 0900  Psych Admission Type (Psych Patients Only)  Admission Status Involuntary  Psychosocial Assessment  Patient Complaints None  Eye Contact Fair  Facial Expression Anxious  Affect Blunted  Speech Tangential  Interaction Assertive  Motor Activity Slow  Appearance/Hygiene Layered clothes  Behavior Characteristics Cooperative  Mood Preoccupied  Thought Process  Coherency Disorganized  Content Preoccupation;Delusions  Delusions Grandeur  Perception Derealization  Hallucination None reported or observed  Judgment Poor  Confusion None  Danger to Self  Current suicidal ideation? Denies  Danger to Others  Danger to Others None reported or observed

## 2023-04-04 DIAGNOSIS — F250 Schizoaffective disorder, bipolar type: Secondary | ICD-10-CM | POA: Diagnosis not present

## 2023-04-04 NOTE — Progress Notes (Signed)
[facility] MD Progress Note  04/04/2023 10:28 AM [name] [name]  MRN:  [id number] Subjective:   [name] [name] is a 42 yr old male who presented on 9/3 to [facility] with delusions and paranoia, he was admitted to [facility] on 9/4.  PPHx is significant for Schizoaffective Disorder, Bipolar Type, Anxiety, Depression, and Polysubstance Abuse (Cocaine, Meth, EtOH, and an extensive history of aggression, and 6 Prior Psychiatric Hospitalizations (last [facility]- 10/2022), and no history of Suicide Attempts or Self Injurious Behavior. He does have an ACT Team- [facility].   Case was discussed in the multidisciplinary team. MAR was reviewed and patient was compliant with medications.  Staff reports the patient slept 9 hours.  No behavioral issues are noted and no PRNs were received and patient continues to cooperate and comply with treatment.  Psychiatric Team made the following recommendations yesterday: -Continue Haldol 10 mg TID for psychosis and mood stability -Continue Zyprexa 10 mg AM and 20 mg QHS for psychosis and mood stability -Continue Depakote DR to Depakene 1000 mg BID for mood stability.  Valproic acid level was 73 on 04/02/2023. -Continue Cogentin 1 mg BID for drug induced EPS -One-on-one was discontinued 2 days ago -Continue Restoril 15 mg QHS   On interview today patient reports he slept good last night.  He reports his appetite is doing good.  He reports no SI, HI, or AVH.  He reports no Paranoia or Ideas of Reference.  He reports no issues with his medications. The patient continues to be pleasant and cooperative.  Over the last 3 days patient has not demonstrated any significant behavioral issues.  He is compliant with medications.  He is somewhat tangential and has some disorganized speech but in general does well.  ACT team has evaluated him.  The plan is for him to consider either a discharge home with ACT team if he maintains his improvement until Monday or Tuesday.  Given his chronic symptoms However, given  his severe history of symptoms, if he decompensates again, he would benefit from going to the state hospital.    Principal Problem: Schizoaffective disorder, bipolar type (HCC) Diagnosis: Principal Problem:   Schizoaffective disorder, bipolar type (HCC) Active Problems:   Substance-induced psychotic disorder (HCC)  Total Time spent with patient:  I personally spent 35 minutes on the unit in direct patient care. The direct patient care time included face-to-face time with the patient, reviewing the patient's chart, communicating with other professionals, and coordinating care. Greater than 50% of this time was spent in counseling or coordinating care with the patient regarding goals of hospitalization, psycho-education, and discharge planning needs.   Past Psychiatric History: Schizoaffective Disorder, Bipolar Type, Anxiety, Depression, and Polysubstance Abuse (Cocaine, Meth, EtOH, and an extensive history of aggression, and 6 Prior Psychiatric Hospitalizations (last [facility]- 10/2022), and no history of Suicide Attempts or Self Injurious Behavior.  He does have an ACT Team- [facility].  Past Medical History:  Past Medical History:  Diagnosis Date   Anxiety    Depression    Mental disorder    Schizophrenia (HCC)    History reviewed. No pertinent surgical history. Family History:  Family History  Problem Relation Age of Onset   Mental illness Neg Hx    Family Psychiatric  History: Reports None Social History:  Social History   Substance and Sexual Activity  Alcohol Use Not Currently   Comment: haven't drank in months"     Social History   Substance and Sexual Activity  Drug Use Yes  Types: Marijuana, Methamphetamines   Comment: Per sister, Pt uses meth    Social History   Socioeconomic History   Marital status: Married    Spouse name: Not on file   Number of children: 1   Years of education: Not on file   Highest education level: Not on file  Occupational History    Occupation: Disabled  Tobacco Use   Smoking status: Every Day    Current packs/day: 1.00    Types: Cigarettes   Smokeless tobacco: Former  Building services engineer status: Unknown  Substance and Sexual Activity   Alcohol use: Not Currently    Comment: haven't drank in months"   Drug use: Yes    Types: Marijuana, Methamphetamines    Comment: 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 Pt lives with mother who IVC'd him.      Pt lives with parents and sister.  He is married, but currently separated.  Pt is on disability.   Social Determinants of Health   Financial Resource Strain: Not on file  Food Insecurity: Patient Unable To Answer (03/09/2023)   Hunger Vital Sign    Worried About Running Out of Food in the Last Year: Patient unable to answer    Ran Out of Food in the Last Year: Patient unable to answer  Transportation Needs: Patient Unable To Answer (03/09/2023)   PRAPARE - Administrator, Civil Service (Medical): Patient unable to answer    Lack of Transportation (Non-Medical): Patient unable to answer  Physical Activity: Not on file  Stress: Not on file  Social Connections: Not on file   Additional Social History:                         Sleep: Good   Appetite:  Good  Current Medications: Current Facility-Administered Medications  Medication Dose Route Frequency Provider Last Rate Last Admin   acetaminophen (TYLENOL) tablet 650 mg  650 mg Oral Q6H PRN Ardis Hughs, NP   650 mg at 03/22/23 2049   alum & mag hydroxide-simeth (MAALOX/MYLANTA) 200-200-20 MG/5ML suspension 30 mL  30 mL Oral Q4H PRN Ardis Hughs, NP       benztropine (COGENTIN) tablet 1 mg  1 mg Oral BID Abbott Pao, Nadir, MD   1 mg at 04/04/23 0815   diphenhydrAMINE (BENADRYL) capsule 50 mg  50 mg Oral TID PRN Armandina Stammer  I, NP   50 mg at 03/26/23 0311   OLANZapine (ZYPREXA) injection 5 mg  5 mg Intramuscular TID PRN Bobbitt, Shalon E, NP   5 mg at 03/25/23 1350   And   diphenhydrAMINE (BENADRYL) injection 50 mg  50 mg Intramuscular TID PRN Bobbitt, Shalon E, NP   50 mg at 03/25/23 1350   gabapentin (NEURONTIN) capsule 300 mg  300 mg Oral TID Armandina Stammer I, NP   300 mg at 04/04/23 0815   haloperidol (HALDOL) 2 MG/ML solution 10 mg  10 mg Oral TID Sarita Bottom, MD   10 mg at 04/04/23 0815   hydrOXYzine (ATARAX) tablet 50 mg  50 mg Oral Q4H PRN Armandina Stammer I, NP   50 mg at 04/02/23 2105   OLANZapine (ZYPREXA) tablet 5 mg  5 mg Oral TID PRN Sanjuana Kava, NP  5 mg at 03/26/23 1610   And   LORazepam (ATIVAN) tablet 1 mg  1 mg Oral TID PRN Armandina Stammer I, NP   1 mg at 03/26/23 9604   magnesium hydroxide (MILK OF MAGNESIA) suspension 30 mL  30 mL Oral Daily PRN Ardis Hughs, NP       nicotine (NICODERM CQ - dosed in mg/24 hours) patch 14 mg  14 mg Transdermal Daily Starleen Blue, NP   14 mg at 03/30/23 0808   OLANZapine zydis (ZYPREXA) disintegrating tablet 10 mg  10 mg Oral Daily Lauro Franklin, MD   10 mg at 04/04/23 0815   OLANZapine zydis (ZYPREXA) disintegrating tablet 20 mg  20 mg Oral QHS Lauro Franklin, MD   20 mg at 04/03/23 2024   temazepam (RESTORIL) capsule 15 mg  15 mg Oral QHS Lauro Franklin, MD   15 mg at 04/03/23 2025   traZODone (DESYREL) tablet 300 mg  300 mg Oral QHS Armandina Stammer I, NP   300 mg at 04/03/23 2024   valproic acid (DEPAKENE) 250 MG/5ML solution 1,000 mg  1,000 mg Oral BID Lauro Franklin, MD   1,000 mg at 04/04/23 0815    Lab Results:  No results found for this or any previous visit (from the past 48 hour(s)).    Blood Alcohol level:  Lab Results  Component Value Date   ETH <10 03/09/2023   ETH <10 10/05/2022    Metabolic Disorder Labs: Lab Results  Component Value Date   HGBA1C 5.7 (H) 10/07/2022   MPG 117 10/07/2022   MPG 114  10/05/2022   Lab Results  Component Value Date   PROLACTIN 17.4 10/05/2022   PROLACTIN 45.8 (H) 12/19/2017   Lab Results  Component Value Date   CHOL 156 10/05/2022   TRIG 116 10/05/2022   HDL 40 (L) 10/05/2022   CHOLHDL 3.9 10/05/2022   VLDL 23 10/05/2022   LDLCALC 93 10/05/2022   LDLCALC 69 11/21/2020    Physical Findings: AIMS: Facial and Oral Movements Muscles of Facial Expression: None, normal Lips and Perioral Area: None, normal Jaw: None, normal Tongue: None, normal,Extremity Movements Upper (arms, wrists, hands, fingers): None, normal Lower (legs, knees, ankles, toes): None, normal, Trunk Movements Neck, shoulders, hips: None, normal, Overall Severity Severity of abnormal movements (highest score from questions above): None, normal Incapacitation due to abnormal movements: None, normal Patient's awareness of abnormal movements (rate only patient's report): No Awareness, Dental Status Current problems with teeth and/or dentures?: No Does patient usually wear dentures?: No  CIWA:    COWS:     Musculoskeletal: Strength & Muscle Tone: within normal limits Gait & Station: normal Patient leans: N/A  Psychiatric Specialty Exam:  Presentation  General Appearance:  Appropriate for Environment; Casual  Eye Contact: Fair  Speech: Slow  Speech Volume: Decreased  Handedness: Right   Mood and Affect  Mood: Euthymic  Affect: Appropriate   Thought Process  Thought Processes: Linear  Descriptions of Associations:Tangential  Orientation:Full (Time, Place and Person)  Thought Content:Rumination  History of Schizophrenia/Schizoaffective disorder:Yes  Duration of Psychotic Symptoms:Greater than six months  Hallucinations:Hallucinations: None    Ideas of Reference:None  Suicidal Thoughts:Suicidal Thoughts: No    Homicidal Thoughts:Homicidal Thoughts: No     Sensorium  Memory: Immediate Fair; Remote Fair; Recent  Fair  Judgment: Fair  Insight: Fair   Art therapist  Concentration: Fair  Attention Span: Fair  Recall: [employer] of Knowledge: Fair  Language: Fair  Psychomotor Activity  Psychomotor Activity:Psychomotor Activity: Normal     Assets  Assets: Desire for Improvement; Resilience   Sleep  Sleep:Sleep: Good Number of Hours of Sleep: 9      Physical Exam: Physical Exam Vitals and nursing note reviewed.  Constitutional:      General: He is not in acute distress.    Appearance: Normal appearance. He is normal weight. He is not ill-appearing or toxic-appearing.  HENT:     Head: Normocephalic and atraumatic.  Pulmonary:     Effort: Pulmonary effort is normal.  Musculoskeletal:        General: Normal range of motion.  Neurological:     General: No focal deficit present.     Mental Status: He is alert.    Review of Systems  Respiratory:  Negative for cough and shortness of breath.   Cardiovascular:  Negative for chest pain.  Gastrointestinal:  Negative for abdominal pain, constipation, diarrhea, nausea and vomiting.  Neurological:  Negative for dizziness, weakness and headaches.  Psychiatric/Behavioral:  Negative for depression, hallucinations and suicidal ideas. The patient is not nervous/anxious.    Blood pressure (!) 118/92, pulse 89, temperature 98 F (36.7 C), temperature source Oral, resp. rate 18, height 5\' 3"  (1.6 m), weight 70 kg, SpO2 97%. Body mass index is 27.35 kg/m.   Treatment Plan Summary: Daily contact with patient to assess and evaluate symptoms and progress in treatment and Medication management  [name] [name] is a 42 yr old male who presented on 9/3 to [facility] with delusions and paranoia, he was admitted to [facility] on 9/4.  PPHx is significant for Schizoaffective Disorder, Bipolar Type, Anxiety, Depression, and Polysubstance Abuse (Cocaine, Meth, EtOH, and an extensive history of aggression, and 6 Prior Psychiatric Hospitalizations  (last [facility]- 10/2022), and no history of Suicide Attempts or Self Injurious Behavior. He does have an ACT Team- [facility].    [name] [name] is showing some improvement as he is not as intrusive or inappropriate.  The ACT team has, in seeing him yesterday and we are waiting there assessment.  We will continue to monitor.     Schizoaffective Disorder, Bipolar Type: -Continue Haldol 10 mg TID for psychosis and mood stability -Continue Zyprexa 10 mg AM and 20 mg QHS for psychosis and mood stability -Continue Depakene 1000 mg BID for mood stability. -Continue Cogentin 1 mg BID for drug induced EPS -Continue Agitation Protocol: Zyprexa/Ativan, Zyprexa.Benadryl, and Benadryl -Continue 1:1 -Referral sent to [facility]   Nicotine Dependence: -Continue Nicotine Patch 14 mg daily   -Continue Restoril 15 mg QHS for insomnia -Continue Gabapentin 300 mg TID  -Continue Trazodone 300 mg QHS for sleep -Continue PRN's: Tylenol, Maalox, Atarax, Milk of Magnesia   [name] Kras, MD 04/04/2023, 10:28 AM Patient ID: [name] [name], male   DOB: 04/20/1981, 42 y.o.   MRN: [id number] Patient ID: [name] [name], male   DOB: 04/26/1981, 42 y.o.   MRN: [id number] Patient ID: [name] [name], male   DOB: 13-Apr-1981, 42 y.o.   MRN: [id number]

## 2023-04-04 NOTE — Plan of Care (Signed)
  Problem: Education: Goal: Emotional status will improve Outcome: Progressing Goal: Mental status will improve Outcome: Progressing   Problem: Activity: Goal: Sleeping patterns will improve Outcome: Progressing   

## 2023-04-04 NOTE — Progress Notes (Signed)
I assumed care for [name] [name] at about 08:00.  He was resting in the day area, watching TV with peers, in no apparent distress.  Pt at baseline, denied any new pain, denied any avh/hi/su at the time odf my assessment, appears to be socially relating well with peers and staff, no sexually inappropriate behaviors thus far. Med compliant. Care handed over to oncoming nurse ta the this time.

## 2023-04-04 NOTE — Plan of Care (Signed)
  Problem: Education: Goal: Knowledge of [facility] Education information/materials will improve Outcome: Progressing Goal: Emotional status will improve Outcome: Progressing Goal: Mental status will improve Outcome: Progressing Goal: Verbalization of understanding the information provided will improve Outcome: Progressing   

## 2023-04-04 NOTE — Group Note (Signed)
Date:  04/04/2023 Time:  9:01 PM  Group Topic/Focus:  Wrap-Up Group:   The focus of this group is to help patients review their daily goal of treatment and discuss progress on daily workbooks.    Participation Level:  Active  Participation Quality:  Appropriate  Affect:  Appropriate  Cognitive:  Appropriate  Insight: Appropriate  Engagement in Group:  Engaged  Modes of Intervention:  Education and Exploration  Additional Comments:  Patient attended and participated in group tonight. He reports that today was good because he is getting ready for the world. He will stay home because every time he walk around he get into trouble.  Lita Mains [facility] 04/04/2023, 9:01 PM

## 2023-04-05 NOTE — [facility] Group Notes (Signed)
Adult Psychoeducational Group Note  Date:  04/05/2023 Time:  8:55 PM  Group Topic/Focus:  Wrap-Up Group:   The focus of this group is to help patients review their daily goal of treatment and discuss progress on daily workbooks.  Participation Level:  Active  Participation Quality:  Appropriate  Affect:  Flat  Cognitive:  Disorganized  Insight: Good  Engagement in Group:  Distracting and Improving  Modes of Intervention:  Discussion  Additional Comments:   Pt states he's had a good day and was able to rest majority of the day. Pt states he is supposed to leave tomorrow and feels excited and ready. Pt denied everything  Vevelyn Pat 04/05/2023, 8:55 PM

## 2023-04-05 NOTE — Group Note (Signed)
LCSW Group Therapy Note  Group Date: 04/05/2023 Start Time: 1300 End Time: 1400   Type of Therapy and Topic:  Group Therapy - How To Cope with Nervousness about Discharge   Participation Level:  Did Not Attend   Description of Group This process group involved identification of patients' feelings about discharge. Some of them are scheduled to be discharged soon, while others are new admissions, but each of them was asked to share thoughts and feelings surrounding discharge from the hospital. One common theme was that they are excited at the prospect of going home, while another was that many of them are apprehensive about sharing why they were hospitalized. Patients were given the opportunity to discuss these feelings with their peers in preparation for discharge.  Therapeutic Goals  Patient will identify their overall feelings about pending discharge. Patient will think about how they might proactively address issues that they believe will once again arise once they get home (i.e. with parents). Patients will participate in discussion about having hope for change.   Summary of Patient Progress:  Did Not Attend   Therapeutic Modalities Cognitive Behavioral Therapy   [name] 04/05/2023  2:11 PM

## 2023-04-05 NOTE — Group Note (Signed)
Recreation Therapy Group Note   Group Topic:Coping Skills  Group Date: 04/05/2023 Start Time: 1020 End Time: 1050 Facilitators: Jiali Linney-McCall, LRT,CTRS Location: [address] Dayroom   Group Topic: Coping Skills   Goal Area(s) Addresses:  Patient will successfully define what a coping skill is. Patient will acknowledge current strategies used in terms of healthy vs unhealthy. Patient will successfully identify benefit of using outlined coping skills post d/c.   Group Description: Mind Map. LRT and patients discussed coping skills and their importance. Patients then given a blank mind map. LRT and patients filled in the first 8 boxes together (depression, anxiety, tolerance, hopelessness, stress, poor nutrition, anger and embarrassment). Patients were to then identify at least 3 coping skills for each situation identified. LRT gave patients 15 minutes before reconvening the group to fill in their answers on the board.    Education: Pharmacologist, Scientist, physiological, Discharge Planning.    Education Outcome: Acknowledges education/In group clarification offered/Needs additional education.    Affect/Mood: Flat   Participation Level: Minimal   Participation Quality: Independent   Behavior: Cooperative   Speech/Thought Process: Relevant   Insight: Limited   Judgement: Limited   Modes of Intervention: Worksheet   Patient Response to Interventions:  Receptive   Education Outcome:  In group clarification offered    Clinical Observations/Individualized Feedback: Pt came to group but appeared drowsy. Pt started filling out sheet but didn't finish. Pt left group and didn't return.      Plan: Continue to engage patient in RT group sessions 2-3x/week.   Fynley Chrystal-McCall, LRT,CTRS  04/05/2023 12:43 PM

## 2023-04-05 NOTE — Progress Notes (Signed)
   04/05/23 2345  Psych Admission Type (Psych Patients Only)  Admission Status Involuntary  Psychosocial Assessment  Patient Complaints None  Eye Contact Fair  Facial Expression Anxious  Affect Appropriate to circumstance  Speech Soft;Rhyming  Interaction Cautious  Motor Activity Fidgety;Slow;Pacing  Appearance/Hygiene Unremarkable;Layered clothes  Behavior Characteristics Cooperative  Mood Anxious  Aggressive Behavior  Effect No apparent injury  Thought Process  Coherency Circumstantial;Disorganized  Content Preoccupation  Delusions WDL  Perception WDL  Hallucination None reported or observed  Judgment Poor  Confusion WDL  Danger to Self  Current suicidal ideation? Denies  Danger to Others  Danger to Others None reported or observed

## 2023-04-05 NOTE — [facility] Group Notes (Signed)
Adult Psychoeducational Group Note  Date:  04/05/2023 Time:  1:52 PM  Group Topic/Focus:  Goals Group:   The focus of this group is to help patients establish daily goals to achieve during treatment and discuss how the patient can incorporate goal setting into their daily lives to aide in recovery. Orientation:   The focus of this group is to educate the patient on the purpose and policies of crisis stabilization and provide a format to answer questions about their admission.  The group details unit policies and expectations of patients while admitted.  Participation Level:  Active  Participation Quality:  Appropriate  Affect:  Appropriate  Cognitive:  Appropriate  Insight: Appropriate  Engagement in Group:  Engaged  Modes of Intervention:  Discussion  Additional Comments:  Pt attended the goals group and remained appropriate and engaged throughout the duration of the group.   Sheran Lawless 04/05/2023, 1:52 PM

## 2023-04-05 NOTE — Plan of Care (Addendum)
Problem: Activity: Goal: Interest or engagement in activities will improve Outcome: Progressing   Problem: Physical Regulation: Goal: Ability to maintain clinical measurements within normal limits will improve Outcome: Progressing   Problem: Safety: Goal: Periods of time without injury will increase Outcome: Progressing  Pt A & O to self, place and situation. Denies SI, HI, AVH and pain when assessed. Visible in bedroom on initial contact; observed to be calmer, less anxious, logical with soft speech. Pt stated he slept well last night with good appetite. Remains medication compliant without adverse drug reactions. Stated "I'm ok, I'm just tired from my medicines. I'm ready to go home. I'm ok now, I'm ready to go home". Only attended orientation group and declined the rest this shift "I'm tired". Q 15 minutes safety checks remains effective without outburst. Tolerates meals and fluids well. Denies concerns at this time.

## 2023-04-05 NOTE — Progress Notes (Signed)
[facility] MD Progress Note  04/05/2023 12:44 PM [name] [name]  MRN:  [id number] Subjective:   [name] [name] is a 42 yr old male who presented on 9/3 to [facility] with delusions and paranoia, he was admitted to [facility] on 9/4.  PPHx is significant for Schizoaffective Disorder, Bipolar Type, Anxiety, Depression, and Polysubstance Abuse (Cocaine, Meth, EtOH, and an extensive history of aggression, and 6 Prior Psychiatric Hospitalizations (last [facility]- 10/2022), and no history of Suicide Attempts or Self Injurious Behavior. He does have an ACT Team- [facility].   Case was discussed in the multidisciplinary team. MAR was reviewed and patient was compliant with medications.  He received PRN Hydroxyzine yesterday.   Psychiatric Team made the following recommendations yesterday: -Continue Haldol 10 mg TID for psychosis and mood stability -Continue Zyprexa 10 mg AM and 20 mg QHS for psychosis and mood stability -Change Depakote DR to Depakene 1000 mg BID for mood stability.  -Continue Cogentin 1 mg BID for drug induced EPS -Continue Restoril 15 mg QHS     On interview today patient reports he slept good last night.  He reports his appetite is doing good.  He reports no SI, HI, or AVH.  He reports no Paranoia or Ideas of Reference.  He reports no issues with his medications.  Discussed with him that since he has done well the last few days we were going to plan for discharge.  Discussed where he would discharge and he reports he will go back to his mothers.  Discussed that Social Work would Water engineer with her.  He reports no other concerns at present.    Principal Problem: Schizoaffective disorder, bipolar type (HCC) Diagnosis: Principal Problem:   Schizoaffective disorder, bipolar type (HCC) Active Problems:   Substance-induced psychotic disorder (HCC)  Total Time spent with patient:  I personally spent 35 minutes on the unit in direct patient care. The direct patient care time included face-to-face time with  the patient, reviewing the patient's chart, communicating with other professionals, and coordinating care. Greater than 50% of this time was spent in counseling or coordinating care with the patient regarding goals of hospitalization, psycho-education, and discharge planning needs.   Past Psychiatric History: Schizoaffective Disorder, Bipolar Type, Anxiety, Depression, and Polysubstance Abuse (Cocaine, Meth, EtOH, and an extensive history of aggression, and 6 Prior Psychiatric Hospitalizations (last [facility]- 10/2022), and no history of Suicide Attempts or Self Injurious Behavior.  He does have an ACT Team- [facility].  Past Medical History:  Past Medical History:  Diagnosis Date   Anxiety    Depression    Mental disorder    Schizophrenia (HCC)    History reviewed. No pertinent surgical history. Family History:  Family History  Problem Relation Age of Onset   Mental illness Neg Hx    Family Psychiatric  History: Reports None 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, Pt uses meth    Social History   Socioeconomic History   Marital status: Married    Spouse name: Not on file   Number of children: 1   Years of education: Not on file   Highest education level: Not on file  Occupational History   Occupation: Disabled  Tobacco Use   Smoking status: Every Day  Current packs/day: 1.00    Types: Cigarettes   Smokeless tobacco: Former  Building services engineer status: Unknown  Substance and Sexual Activity   Alcohol use: Not Currently    Comment: haven't drank in months"   Drug use: Yes    Types: Marijuana, Methamphetamines    Comment: Per sister, Pt uses meth   Sexual activity: Yes    Birth control/protection: None  Other Topics Concern   Not on file  Social  History Narrative   Pt refusing to answer any questions during assessment. Pt lives with mother who IVC'd him.      Pt lives with parents and sister.  He is married, but currently separated.  Pt is on disability.   Social Determinants of Health   Financial Resource Strain: Not on file  Food Insecurity: Patient Unable To Answer (03/09/2023)   Hunger Vital Sign    Worried About Running Out of Food in the Last Year: Patient unable to answer    Ran Out of Food in the Last Year: Patient unable to answer  Transportation Needs: Patient Unable To Answer (03/09/2023)   PRAPARE - Administrator, Civil Service (Medical): Patient unable to answer    Lack of Transportation (Non-Medical): Patient unable to answer  Physical Activity: Not on file  Stress: Not on file  Social Connections: Not on file   Additional Social History:                         Sleep: Good   Appetite:  Good  Current Medications: Current Facility-Administered Medications  Medication Dose Route Frequency Provider Last Rate Last Admin   acetaminophen (TYLENOL) tablet 650 mg  650 mg Oral Q6H PRN Ardis Hughs, NP   650 mg at 03/22/23 2049   alum & mag hydroxide-simeth (MAALOX/MYLANTA) 200-200-20 MG/5ML suspension 30 mL  30 mL Oral Q4H PRN Ardis Hughs, NP       benztropine (COGENTIN) tablet 1 mg  1 mg Oral BID Abbott Pao, Nadir, MD   1 mg at 04/05/23 0807   diphenhydrAMINE (BENADRYL) capsule 50 mg  50 mg Oral TID PRN Armandina Stammer I, NP   50 mg at 03/26/23 0311   OLANZapine (ZYPREXA) injection 5 mg  5 mg Intramuscular TID PRN Bobbitt, Shalon E, NP   5 mg at 03/25/23 1350   And   diphenhydrAMINE (BENADRYL) injection 50 mg  50 mg Intramuscular TID PRN Bobbitt, Shalon E, NP   50 mg at 03/25/23 1350   gabapentin (NEURONTIN) capsule 300 mg  300 mg Oral TID Armandina Stammer I, NP   300 mg at 04/05/23 0807   haloperidol (HALDOL) 2 MG/ML solution 10 mg  10 mg Oral TID Sarita Bottom, MD   10 mg at 04/05/23 0805    hydrOXYzine (ATARAX) tablet 50 mg  50 mg Oral Q4H PRN Armandina Stammer I, NP   50 mg at 04/02/23 2105   OLANZapine (ZYPREXA) tablet 5 mg  5 mg Oral TID PRN Armandina Stammer I, NP   5 mg at 03/26/23 2956   And   LORazepam (ATIVAN) tablet 1 mg  1 mg Oral TID PRN Armandina Stammer I, NP   1 mg at 03/26/23 0311   magnesium hydroxide (MILK OF MAGNESIA) suspension 30 mL  30 mL Oral Daily PRN Ardis Hughs, NP       nicotine (NICODERM CQ - dosed in mg/24 hours) patch 14 mg  14 mg Transdermal Daily  Starleen Blue, NP   14 mg at 03/30/23 0808   OLANZapine zydis (ZYPREXA) disintegrating tablet 10 mg  10 mg Oral Daily Lauro Franklin, MD   10 mg at 04/05/23 0806   OLANZapine zydis (ZYPREXA) disintegrating tablet 20 mg  20 mg Oral QHS Lauro Franklin, MD   20 mg at 04/04/23 2033   temazepam (RESTORIL) capsule 15 mg  15 mg Oral QHS Lauro Franklin, MD   15 mg at 04/04/23 2033   traZODone (DESYREL) tablet 300 mg  300 mg Oral QHS Armandina Stammer I, NP   300 mg at 04/04/23 1940   valproic acid (DEPAKENE) 250 MG/5ML solution 1,000 mg  1,000 mg Oral BID Lauro Franklin, MD   1,000 mg at 04/05/23 4540    Lab Results:  No results found for this or any previous visit (from the past 48 hour(s)).    Blood Alcohol level:  Lab Results  Component Value Date   ETH <10 03/09/2023   ETH <10 10/05/2022    Metabolic Disorder Labs: Lab Results  Component Value Date   HGBA1C 5.7 (H) 10/07/2022   MPG 117 10/07/2022   MPG 114 10/05/2022   Lab Results  Component Value Date   PROLACTIN 17.4 10/05/2022   PROLACTIN 45.8 (H) 12/19/2017   Lab Results  Component Value Date   CHOL 156 10/05/2022   TRIG 116 10/05/2022   HDL 40 (L) 10/05/2022   CHOLHDL 3.9 10/05/2022   VLDL 23 10/05/2022   LDLCALC 93 10/05/2022   LDLCALC 69 11/21/2020    Physical Findings: AIMS: Facial and Oral Movements Muscles of Facial Expression: None, normal Lips and Perioral Area: None, normal Jaw: None, normal Tongue:  None, normal,Extremity Movements Upper (arms, wrists, hands, fingers): None, normal Lower (legs, knees, ankles, toes): None, normal, Trunk Movements Neck, shoulders, hips: None, normal, Overall Severity Severity of abnormal movements (highest score from questions above): None, normal Incapacitation due to abnormal movements: None, normal Patient's awareness of abnormal movements (rate only patient's report): No Awareness, Dental Status Current problems with teeth and/or dentures?: No Does patient usually wear dentures?: No  CIWA:    COWS:     Musculoskeletal: Strength & Muscle Tone: within normal limits Gait & Station: normal Patient leans: N/A  Psychiatric Specialty Exam:  Presentation  General Appearance:  Appropriate for Environment; Casual  Eye Contact: Fair  Speech: Clear and Coherent  Speech Volume: Normal  Handedness: Right   Mood and Affect  Mood: Euthymic  Affect: Appropriate; Congruent   Thought Process  Thought Processes: Linear  Descriptions of Associations:Intact  Orientation:Full (Time, Place and Person)  Thought Content:Rumination  History of Schizophrenia/Schizoaffective disorder:Yes  Duration of Psychotic Symptoms:Greater than six months  Hallucinations:Hallucinations: None    Ideas of Reference:None  Suicidal Thoughts:Suicidal Thoughts: No    Homicidal Thoughts:Homicidal Thoughts: No     Sensorium  Memory: Immediate Fair; Recent Fair  Judgment: Fair  Insight: Fair   Art therapist  Concentration: Fair  Attention Span: Fair  Recall: Fair  Fund of Knowledge: Fair  Language: Fair   Psychomotor Activity  Psychomotor Activity:Psychomotor Activity: Normal     Assets  Assets: Desire for Improvement; Resilience   Sleep  Sleep:Sleep: Good Number of Hours of Sleep: 7.5      Physical Exam: Physical Exam Vitals and nursing note reviewed.  Constitutional:      General: He is not in  acute distress.    Appearance: Normal appearance. He is normal weight. He is not ill-appearing or  toxic-appearing.  HENT:     Head: Normocephalic and atraumatic.  Pulmonary:     Effort: Pulmonary effort is normal.  Musculoskeletal:        General: Normal range of motion.  Neurological:     General: No focal deficit present.     Mental Status: He is alert.    Review of Systems  Respiratory:  Negative for cough and shortness of breath.   Cardiovascular:  Negative for chest pain.  Gastrointestinal:  Negative for abdominal pain, constipation, diarrhea, nausea and vomiting.  Neurological:  Negative for dizziness, weakness and headaches.  Psychiatric/Behavioral:  Negative for depression, hallucinations and suicidal ideas. The patient is not nervous/anxious.    Blood pressure (!) 139/91, pulse 83, temperature 97.8 F (36.6 C), temperature source Oral, resp. rate 18, height 5\' 3"  (1.6 m), weight 70 kg, SpO2 97%. Body mass index is 27.35 kg/m.   Treatment Plan Summary: Daily contact with patient to assess and evaluate symptoms and progress in treatment and Medication management  [name] [name] is a 42 yr old male who presented on 9/3 to [facility] with delusions and paranoia, he was admitted to [facility] on 9/4.  PPHx is significant for Schizoaffective Disorder, Bipolar Type, Anxiety, Depression, and Polysubstance Abuse (Cocaine, Meth, EtOH, and an extensive history of aggression, and 6 Prior Psychiatric Hospitalizations (last [facility]- 10/2022), and no history of Suicide Attempts or Self Injurious Behavior. He does have an ACT Team- [facility].    [name] [name] has maintained relatively well without 1:1 monitoring and has not any behavioral issues.  Due to this we will Safety Plan with his mother and will plan for discharge tomorrow.  We will not make any changes to his medications at this time.  We will continue to monitor.    Schizoaffective Disorder, Bipolar Type: -Continue Haldol 10 mg TID for psychosis and  mood stability -Continue Zyprexa 10 mg AM and 20 mg QHS for psychosis and mood stability -Continue Depakene 1000 mg BID for mood stability. -Continue Cogentin 1 mg BID for drug induced EPS -Continue Agitation Protocol: Zyprexa/Ativan, Zyprexa.Benadryl, and Benadryl   Nicotine Dependence: -Continue Nicotine Patch 14 mg daily   -Continue Restoril 15 mg QHS for insomnia -Continue Gabapentin 300 mg TID  -Continue Trazodone 300 mg QHS for sleep -Continue PRN's: Tylenol, Maalox, Atarax, Milk of Magnesia   Lauro Franklin, MD 04/05/2023, 12:44 PM

## 2023-04-05 NOTE — Group Note (Signed)
Date:  04/05/2023 Time:  9:33 PM  Group Topic/Focus:  Recovery Goals:   The focus of this group is to identify appropriate goals for recovery and establish a plan to achieve them.    Participation Level:  Did Not Attend  Participation Quality:    Affect:    Cognitive:    Insight:   Engagement in Group:    Modes of Intervention:    Additional Comments:    [name] [name] 04/05/2023, 9:33 PM

## 2023-04-06 DIAGNOSIS — F250 Schizoaffective disorder, bipolar type: Secondary | ICD-10-CM | POA: Diagnosis not present

## 2023-04-06 MED ORDER — HALOPERIDOL DECANOATE 100 MG/ML IM SOLN
100.0000 mg | Freq: Once | INTRAMUSCULAR | Status: AC
  Administered 2023-04-06: 100 mg via INTRAMUSCULAR
  Filled 2023-04-06: qty 1

## 2023-04-06 NOTE — Plan of Care (Signed)
  Problem: Education: Goal: Emotional status will improve Outcome: Progressing Goal: Mental status will improve Outcome: Progressing   Problem: Activity: Goal: Interest or engagement in activities will improve Outcome: Progressing Goal: Sleeping patterns will improve Outcome: Progressing   Problem: Coping: Goal: Ability to demonstrate self-control will improve Outcome: Progressing   

## 2023-04-06 NOTE — Group Note (Signed)
Recreation Therapy Group Note   Group Topic:Relaxation  Group Date: 04/06/2023 Start Time: 1040 End Time: 1110 Facilitators: Jazarah Capili-McCall, LRT,CTRS Location: [address] Dayroom   Group Topic: Stress Management  Goal Area(s) Addresses:  Patient will identify positive benefits of music. Patient will identify benefits of using music as a means to relax.  Group Description: LRT and patients discussed the importance of music and how it can affect your mood. LRT allowed patients to pick songs that interested them. As long as the songs were clean and appropriate, LRT played the songs for patients.    Education:  Stress Management, Discharge Planning.   Education Outcome: Acknowledges Education   Affect/Mood: Flat   Participation Level: None   Participation Quality: None   Behavior: Drowsy   Speech/Thought Process: None   Insight: None   Judgement: None   Modes of Intervention: Music   Patient Response to Interventions:  None   Education Outcome:  In group clarification offered    Clinical Observations/Individualized Feedback: Pt came in late to group. Pt did not participate in group. Pt sat by the hallway window dosing off.      Plan: Continue to engage patient in RT group sessions 2-3x/week.   Zuriah Bordas-McCall, LRT,CTRS 04/06/2023 11:48 AM

## 2023-04-06 NOTE — [facility] Group Notes (Signed)
Adult Psychoeducational Group Note  Date:  04/06/2023 Time:  8:22 PM  Group Topic/Focus:  Wrap-Up Group:   The focus of this group is to help patients review their daily goal of treatment and discuss progress on daily workbooks.  Participation Level:  Active  Participation Quality:  Appropriate  Affect:  Appropriate  Cognitive:  Disorganized  Insight: Good  Engagement in Group:  Engaged  Modes of Intervention:  Discussion  Additional Comments:   Pt states that he was frustrated today because he was supposed to go home but ended up not being able to. Pt states that he is waiting to see his ACT team and then after that his mom can come pick him up. Pt attended groups throughout the day and endorsed some leg and foot pain from "walking too much".    Vevelyn Pat 04/06/2023, 8:22 PM

## 2023-04-06 NOTE — Progress Notes (Signed)
Pt ambulatory pacing in hallway -alert and verbally nonsensical but cooperative. Clear and responds appropriately intermittently. Vital signs stable with no complaints currently.

## 2023-04-06 NOTE — Plan of Care (Signed)
  Problem: Education: Goal: Emotional status will improve Outcome: Progressing   Problem: Activity: Goal: Interest or engagement in activities will improve Outcome: Progressing Goal: Sleeping patterns will improve Outcome: Progressing   Problem: Safety: Goal: Periods of time without injury will increase Outcome: Progressing

## 2023-04-06 NOTE — Progress Notes (Signed)
Pt denies SI/HI and AVH currently

## 2023-04-06 NOTE — [facility] Counselor (Signed)
CSW along with Dr. Abbott Pao spoke wit NP Veronique with [facility] to discuss pt's discharge . Pt will be reassessed on 04-08-2023. To determine appropriateness for ACTT services. CSW will continue to monitor.

## 2023-04-06 NOTE — Progress Notes (Signed)
[facility] [name] [name] Progress Note  04/06/2023 1:03 PM [name] [name]  MRN:  [id number] Subjective:   [name] [name] is a 42 yr old male who presented on 9/3 to [facility] with delusions and paranoia, he was admitted to [facility] on 9/4.  PPHx is significant for Schizoaffective Disorder, Bipolar Type, Anxiety, Depression, and Polysubstance Abuse (Cocaine, Meth, EtOH, and an extensive history of aggression, and 6 Prior Psychiatric Hospitalizations (last [facility]- 10/2022), and no history of Suicide Attempts or Self Injurious Behavior. He does have an ACT Team- [facility].   Case was discussed in the multidisciplinary team. MAR was reviewed and patient was compliant with medications.  He received PRN Hydroxyzine yesterday.  Staff reports patient continues to present calm with no agitation incidents or being intrusive, no episodes of hypersexuality or sexually inappropriateness noted, per chart review last the as needed agitation protocol was given on 9/20    Today Patient was evaluated in his room he reports fair sleep and appetite, reports compliance with medications and denies side effect event, he appears slightly drowsy but does not display any sign consistent with EPS or TD.  He continues to deny SI HI or AVH he asks about his discharge.  He tells me that he plans to go back living with his mother and brother.  I contacted patient's mother with his permission she reported at last phone call with him with about a week ago during which she did continue to present to her disorganized and paranoid that staff in the hospital are killing him with medication.  I did discuss with her that his medications have been changed since then and he is demonstrating some improvement.  I did encourage patient to contact his mother today and I will call her again tomorrow to follow-up.  ACT team staff was contacted today, discussed the current improvement with current medication regimen, and seeing staff will visit patient on Thursday to assess if back  to baseline.   Principal Problem: Schizoaffective disorder, bipolar type (HCC) Diagnosis: Principal Problem:   Schizoaffective disorder, bipolar type (HCC) Active Problems:   Substance-induced psychotic disorder (HCC)  Total Time spent with patient:  I personally spent 35 minutes on the unit in direct patient care. The direct patient care time included face-to-face time with the patient, reviewing the patient's chart, communicating with other professionals, and coordinating care. Greater than 50% of this time was spent in counseling or coordinating care with the patient regarding goals of hospitalization, psycho-education, and discharge planning needs.   Past Psychiatric History: Schizoaffective Disorder, Bipolar Type, Anxiety, Depression, and Polysubstance Abuse (Cocaine, Meth, EtOH, and an extensive history of aggression, and 6 Prior Psychiatric Hospitalizations (last [facility]- 10/2022), and no history of Suicide Attempts or Self Injurious Behavior.  He does have an ACT Team- [facility].  Past Medical History:  Past Medical History:  Diagnosis Date   Anxiety    Depression    Mental disorder    Schizophrenia (HCC)    History reviewed. No pertinent surgical history. Family History:  Family History  Problem Relation Age of Onset   Mental illness Neg Hx    Family Psychiatric  History: Reports None 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, Pt uses meth    Social History  Socioeconomic History   Marital status: Married    Spouse name: Not on file   Number of children: 1   Years of education: Not on file   Highest education level: Not on file  Occupational History   Occupation: Disabled  Tobacco Use   Smoking status: Every Day    Current packs/day: 1.00     Types: Cigarettes   Smokeless tobacco: Former  Building services engineer status: Unknown  Substance and Sexual Activity   Alcohol use: Not Currently    Comment: haven't drank in months"   Drug use: Yes    Types: Marijuana, Methamphetamines    Comment: 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 Pt lives with mother who IVC'd him.      Pt lives with parents and sister.  He is married, but currently separated.  Pt is on disability.   Social Determinants of Health   Financial Resource Strain: Not on file  Food Insecurity: Patient Unable To Answer (03/09/2023)   Hunger Vital Sign    Worried About Running Out of Food in the Last Year: Patient unable to answer    Ran Out of Food in the Last Year: Patient unable to answer  Transportation Needs: Patient Unable To Answer (03/09/2023)   PRAPARE - Administrator, Civil Service (Medical): Patient unable to answer    Lack of Transportation (Non-Medical): Patient unable to answer  Physical Activity: Not on file  Stress: Not on file  Social Connections: Not on file   Additional Social History:                         Sleep: Good   Appetite:  Good  Current Medications: Current Facility-Administered Medications  Medication Dose Route Frequency Provider Last Rate Last Admin   acetaminophen (TYLENOL) tablet 650 mg  650 mg Oral Q6H PRN Ardis Hughs, NP   650 mg at 04/06/23 0549   alum & mag hydroxide-simeth (MAALOX/MYLANTA) 200-200-20 MG/5ML suspension 30 mL  30 mL Oral Q4H PRN Ardis Hughs, NP       benztropine (COGENTIN) tablet 1 mg  1 mg Oral BID Lynasia Meloche, [name] [name]   1 mg at 04/06/23 0813   diphenhydrAMINE (BENADRYL) capsule 50 mg  50 mg Oral TID PRN Armandina Stammer I, NP   50 mg at 03/26/23 0311   OLANZapine (ZYPREXA) injection 5 mg  5 mg Intramuscular TID PRN  Bobbitt, Shalon E, NP   5 mg at 03/25/23 1350   And   diphenhydrAMINE (BENADRYL) injection 50 mg  50 mg Intramuscular TID PRN Bobbitt, Shalon E, NP   50 mg at 03/25/23 1350   gabapentin (NEURONTIN) capsule 300 mg  300 mg Oral TID Armandina Stammer I, NP   300 mg at 04/06/23 0813   haloperidol (HALDOL) 2 MG/ML solution 10 mg  10 mg Oral TID Sarita Bottom, [name] [name]   10 mg at 04/06/23 0804   haloperidol decanoate (HALDOL DECANOATE) 100 MG/ML injection 100 mg  100 mg Intramuscular Once Ayomide Purdy, [name] [name]       hydrOXYzine (ATARAX) tablet 50 mg  50 mg Oral Q4H PRN Armandina Stammer I, NP   50 mg at 04/05/23 2105   OLANZapine (ZYPREXA) tablet 5 mg  5 mg Oral TID PRN Nwoko,  Nicole Kindred I, NP   5 mg at 03/26/23 0981   And   LORazepam (ATIVAN) tablet 1 mg  1 mg Oral TID PRN Armandina Stammer I, NP   1 mg at 03/26/23 1914   magnesium hydroxide (MILK OF MAGNESIA) suspension 30 mL  30 mL Oral Daily PRN Ardis Hughs, NP       nicotine (NICODERM CQ - dosed in mg/24 hours) patch 14 mg  14 mg Transdermal Daily Starleen Blue, NP   14 mg at 03/30/23 0808   OLANZapine zydis (ZYPREXA) disintegrating tablet 20 mg  20 mg Oral QHS Lauro Franklin, [name] [name]   20 mg at 04/05/23 2104   temazepam (RESTORIL) capsule 15 mg  15 mg Oral QHS Lauro Franklin, [name] [name]   15 mg at 04/05/23 2104   traZODone (DESYREL) tablet 300 mg  300 mg Oral QHS Armandina Stammer I, NP   300 mg at 04/05/23 2104   valproic acid (DEPAKENE) 250 MG/5ML solution 1,000 mg  1,000 mg Oral BID Lauro Franklin, [name] [name]   1,000 mg at 04/06/23 0805    Lab Results:  No results found for this or any previous visit (from the past 48 hour(s)).    Blood Alcohol level:  Lab Results  Component Value Date   ETH <10 03/09/2023   ETH <10 10/05/2022    Metabolic Disorder Labs: Lab Results  Component Value Date   HGBA1C 5.7 (H) 10/07/2022   MPG 117 10/07/2022   MPG 114 10/05/2022   Lab Results  Component Value Date   PROLACTIN 17.4 10/05/2022   PROLACTIN 45.8 (H) 12/19/2017    Lab Results  Component Value Date   CHOL 156 10/05/2022   TRIG 116 10/05/2022   HDL 40 (L) 10/05/2022   CHOLHDL 3.9 10/05/2022   VLDL 23 10/05/2022   LDLCALC 93 10/05/2022   LDLCALC 69 11/21/2020    Physical Findings: AIMS: Facial and Oral Movements Muscles of Facial Expression: None, normal Lips and Perioral Area: None, normal Jaw: None, normal Tongue: None, normal,Extremity Movements Upper (arms, wrists, hands, fingers): None, normal Lower (legs, knees, ankles, toes): None, normal, Trunk Movements Neck, shoulders, hips: None, normal, Overall Severity Severity of abnormal movements (highest score from questions above): None, normal Incapacitation due to abnormal movements: None, normal Patient's awareness of abnormal movements (rate only patient's report): No Awareness, Dental Status Current problems with teeth and/or dentures?: No Does patient usually wear dentures?: No  CIWA:    COWS:     Musculoskeletal: Strength & Muscle Tone: within normal limits Gait & Station: normal Patient leans: N/A  Psychiatric Specialty Exam:  Presentation  General Appearance:  Appropriate for Environment; Casual  Eye Contact: Fair  Speech: Clear and Coherent  Speech Volume: Normal  Handedness: Right   Mood and Affect  Mood: Euthymic  Affect: Appropriate; Congruent   Thought Process  Thought Processes: Linear  Descriptions of Associations:Intact  Orientation:Full (Time, Place and Person)  Thought Content:Rumination  History of Schizophrenia/Schizoaffective disorder:Yes  Duration of Psychotic Symptoms:Greater than six months  Hallucinations:Hallucinations: None    Ideas of Reference:None  Suicidal Thoughts:Suicidal Thoughts: No    Homicidal Thoughts:Homicidal Thoughts: No     Sensorium  Memory: Immediate Fair; Recent Fair  Judgment: Fair  Insight: Fair   Art therapist  Concentration: Fair  Attention  Span: Fair  Recall: Fair  Fund of Knowledge: Fair  Language: Fair   Psychomotor Activity  Psychomotor Activity:Psychomotor Activity: Normal     Assets  Assets: Desire for Improvement; Resilience  Sleep  Sleep:Sleep: Good Number of Hours of Sleep: 7.5      Physical Exam: Physical Exam Vitals and nursing note reviewed.  Constitutional:      General: He is not in acute distress.    Appearance: Normal appearance. He is normal weight. He is not ill-appearing or toxic-appearing.  HENT:     Head: Normocephalic and atraumatic.  Pulmonary:     Effort: Pulmonary effort is normal.  Musculoskeletal:        General: Normal range of motion.  Neurological:     General: No focal deficit present.     Mental Status: He is alert.    Review of Systems  Respiratory:  Negative for cough and shortness of breath.   Cardiovascular:  Negative for chest pain.  Gastrointestinal:  Negative for abdominal pain, constipation, diarrhea, nausea and vomiting.  Neurological:  Negative for dizziness, weakness and headaches.  Psychiatric/Behavioral:  Negative for depression, hallucinations and suicidal ideas. The patient is not nervous/anxious.   All other systems reviewed and are negative.  Blood pressure 114/85, pulse 82, temperature 97.9 F (36.6 C), temperature source Oral, resp. rate 18, height 5\' 3"  (1.6 m), weight 70 kg, SpO2 96%. Body mass index is 27.35 kg/m.   Treatment Plan Summary: Daily contact with patient to assess and evaluate symptoms and progress in treatment and Medication management  Zan Orlick is a 42 yr old male who presented on 9/3 to [facility] with delusions and paranoia, he was admitted to [facility] on 9/4.  PPHx is significant for Schizoaffective Disorder, Bipolar Type, Anxiety, Depression, and Polysubstance Abuse (Cocaine, Meth, EtOH, and an extensive history of aggression, and 6 Prior Psychiatric Hospitalizations (last [facility]- 10/2022), and no history of Suicide Attempts or  Self Injurious Behavior. He does have an ACT Team- [facility].    July has maintained relatively well without 1:1 monitoring and has not any behavioral issues.  Due to this we will Safety Plan with his mother and will plan for discharge tomorrow.  We will not make any changes to his medications at this time.  We will continue to monitor.    Schizoaffective Disorder, Bipolar Type: -Continue Haldol 10 mg TID for psychosis and mood stability Start Haldol decanoate LAI will give 100 mg today and 200 mg on days 3/Thursday to improve long-term compliance and decrease risk of decompensation. -Discontinue morning dose of Zyprexa to see if it will help with drowsiness and continue Zyprexa 20 mg QHS for psychosis and mood stability -Continue Depakene 1000 mg BID for mood stability. -Continue Cogentin 1 mg BID for drug induced EPS -Continue Agitation Protocol: Zyprexa/Ativan, Zyprexa.Benadryl, and Benadryl   Nicotine Dependence: -Continue Nicotine Patch 14 mg daily   -Continue Restoril 15 mg QHS for insomnia -Continue Gabapentin 300 mg TID  -Continue Trazodone 300 mg QHS for sleep -Continue PRN's: Tylenol, Maalox, Atarax, Milk of Magnesia   [name] Bomar Abbott Pao, [name] [name] 04/06/2023, 1:03 PM

## 2023-04-06 NOTE — Progress Notes (Signed)
   04/06/23 2230  Psych Admission Type (Psych Patients Only)  Admission Status Involuntary  Psychosocial Assessment  Patient Complaints None  Eye Contact Fair  Facial Expression Anxious  Affect Appropriate to circumstance  Speech Soft;Rhyming  Interaction Cautious  Motor Activity Fidgety;Slow;Pacing  Appearance/Hygiene Unremarkable;Layered clothes  Behavior Characteristics Cooperative  Mood Anxious  Aggressive Behavior  Effect No apparent injury  Thought Process  Coherency Circumstantial;Disorganized  Content Preoccupation  Delusions WDL  Perception WDL  Hallucination None reported or observed  Judgment Poor  Confusion WDL  Danger to Self  Current suicidal ideation? Denies  Danger to Others  Danger to Others None reported or observed

## 2023-04-07 ENCOUNTER — Encounter (HOSPITAL_COMMUNITY): Payer: Self-pay

## 2023-04-07 DIAGNOSIS — F250 Schizoaffective disorder, bipolar type: Secondary | ICD-10-CM | POA: Diagnosis not present

## 2023-04-07 NOTE — Progress Notes (Signed)
   04/07/23 2100  Psych Admission Type (Psych Patients Only)  Admission Status Involuntary  Psychosocial Assessment  Patient Complaints None  Eye Contact Fair  Facial Expression Anxious  Affect Appropriate to circumstance  Speech Soft;Rhyming  Interaction Cautious  Motor Activity Fidgety;Slow;Pacing  Appearance/Hygiene Unremarkable;Layered clothes  Behavior Characteristics Cooperative  Mood Anxious  Aggressive Behavior  Effect No apparent injury  Thought Process  Coherency Circumstantial;Disorganized  Content Preoccupation  Delusions WDL  Perception WDL  Hallucination None reported or observed  Judgment Poor  Confusion WDL  Danger to Self  Current suicidal ideation? Denies  Danger to Others  Danger to Others None reported or observed

## 2023-04-07 NOTE — Plan of Care (Signed)
  Problem: Education: Goal: Emotional status will improve Outcome: Progressing Goal: Mental status will improve Outcome: Progressing   Problem: Activity: Goal: Interest or engagement in activities will improve Outcome: Progressing Goal: Sleeping patterns will improve Outcome: Progressing

## 2023-04-07 NOTE — Progress Notes (Signed)
   04/07/23 1100  Psych Admission Type (Psych Patients Only)  Admission Status Involuntary  Psychosocial Assessment  Patient Complaints None  Eye Contact Fair  Facial Expression Anxious  Affect Appropriate to circumstance  Speech Soft;Rhyming  Interaction Cautious  Motor Activity Fidgety;Slow  Appearance/Hygiene Unremarkable;Layered clothes  Behavior Characteristics Cooperative  Mood Anxious;Pleasant  Thought Process  Coherency Circumstantial;Disorganized  Content Preoccupation  Delusions WDL  Perception WDL  Hallucination None reported or observed  Judgment Poor  Confusion WDL  Danger to Self  Current suicidal ideation? Denies  Danger to Others  Danger to Others None reported or observed

## 2023-04-07 NOTE — Group Note (Signed)
Recreation Therapy Group Note   Group Topic:Health and Wellness  Group Date: 04/07/2023 Start Time: 1000 End Time: 1025 Facilitators: Nayara Taplin-McCall, LRT,CTRS Location: [address] Dayroom   Group Topic: Wellness  Goal Area(s) Addresses:  Patient will define components of whole wellness. Patient will verbalize benefit of whole wellness.  Group Description: Exercise. LRT explained to patients group would consist of completing at least 20 minutes of exercise. Patients would take turns leading the group in the exercises of their choosing. Patients were instructed to take breaks and/or get water as needed.   Education: Wellness, Building control surveyor.   Education Outcome: Acknowledges education/In group clarification offered/Needs additional education.    Affect/Mood: Appropriate   Participation Level: Engaged   Participation Quality: Independent   Behavior: Appropriate   Speech/Thought Process: Focused   Insight: Fair   Judgement: Fair    Modes of Intervention: Music   Patient Response to Interventions:  Engaged   Education Outcome:  In group clarification offered    Clinical Observations/Individualized Feedback: Pt was engaged and participated in group activity. Pt was able to lead group in exercises. LRT would encourage pt throughout the activity to help keep him motivated. Pt was appropriate during group.     Plan: Continue to engage patient in RT group sessions 2-3x/week.   Lance Galas-McCall, LRT,CTRS  04/07/2023 11:08 AM

## 2023-04-07 NOTE — BH IP Treatment Plan (Signed)
Interdisciplinary Treatment and Diagnostic Plan Update  04/07/2023 Time of Session: 200 [name] [name] MRN: [id number]  Principal Diagnosis: Schizoaffective disorder, bipolar type [facility])  Secondary Diagnoses: Principal Problem:   Schizoaffective disorder, bipolar type (HCC) Active Problems:   Substance-induced psychotic disorder (HCC)   Current Medications:  Current Facility-Administered Medications  Medication Dose Route Frequency Provider Last Rate Last Admin   acetaminophen (TYLENOL) tablet 650 mg  650 mg Oral Q6H PRN Ardis Hughs, NP   650 mg at 04/06/23 0549   alum & mag hydroxide-simeth (MAALOX/MYLANTA) 200-200-20 MG/5ML suspension 30 mL  30 mL Oral Q4H PRN Ardis Hughs, NP       benztropine (COGENTIN) tablet 1 mg  1 mg Oral BID Abbott Pao, Nadir, MD   1 mg at 04/07/23 0801   diphenhydrAMINE (BENADRYL) capsule 50 mg  50 mg Oral TID PRN Armandina Stammer I, NP   50 mg at 03/26/23 0311   OLANZapine (ZYPREXA) injection 5 mg  5 mg Intramuscular TID PRN Bobbitt, Shalon E, NP   5 mg at 03/25/23 1350   And   diphenhydrAMINE (BENADRYL) injection 50 mg  50 mg Intramuscular TID PRN Bobbitt, Shalon E, NP   50 mg at 03/25/23 1350   gabapentin (NEURONTIN) capsule 300 mg  300 mg Oral TID Armandina Stammer I, NP   300 mg at 04/07/23 1346   haloperidol (HALDOL) 2 MG/ML solution 10 mg  10 mg Oral TID Sarita Bottom, MD   10 mg at 04/07/23 1346   hydrOXYzine (ATARAX) tablet 50 mg  50 mg Oral Q4H PRN Armandina Stammer I, NP   50 mg at 04/06/23 2028   OLANZapine (ZYPREXA) tablet 5 mg  5 mg Oral TID PRN Armandina Stammer I, NP   5 mg at 03/26/23 6295   And   LORazepam (ATIVAN) tablet 1 mg  1 mg Oral TID PRN Armandina Stammer I, NP   1 mg at 03/26/23 0311   magnesium hydroxide (MILK OF MAGNESIA) suspension 30 mL  30 mL Oral Daily PRN Ardis Hughs, NP       nicotine (NICODERM CQ - dosed in mg/24 hours) patch 14 mg  14 mg Transdermal Daily Nkwenti, Tyler Aas, NP   14 mg at 03/30/23 0808   OLANZapine zydis (ZYPREXA)  disintegrating tablet 20 mg  20 mg Oral QHS Lauro Franklin, MD   20 mg at 04/06/23 2028   temazepam (RESTORIL) capsule 15 mg  15 mg Oral QHS Lauro Franklin, MD   15 mg at 04/06/23 2029   traZODone (DESYREL) tablet 300 mg  300 mg Oral QHS Armandina Stammer I, NP   300 mg at 04/06/23 2029   valproic acid (DEPAKENE) 250 MG/5ML solution 1,000 mg  1,000 mg Oral BID Lauro Franklin, MD   1,000 mg at 04/07/23 0800   PTA Medications: Medications Prior to Admission  Medication Sig Dispense Refill Last Dose   INVEGA TRINZA 546 MG/1.75ML injection Inject 546 mg into the muscle every 3 (three) months. (Patient not taking: Reported on 03/09/2023)      lithium 300 MG tablet Take 300 mg by mouth daily. (Patient not taking: Reported on 03/09/2023)      OLANZapine (ZYPREXA) 20 MG tablet Take 1 tablet (20 mg total) by mouth at bedtime. For mood control (Patient not taking: Reported on 03/09/2023) 30 tablet 0    OLANZapine (ZYPREXA) 5 MG tablet Take 1 tablet (5 mg total) by mouth 2 (two) times daily.       Patient Stressors:  Other: don't want to be here.    Patient Strengths: Supportive family/friends   Treatment Modalities: Medication Management, Group therapy, Case management,  1 to 1 session with clinician, Psychoeducation, Recreational therapy.   Physician Treatment Plan for Primary Diagnosis: Schizoaffective disorder, bipolar type (HCC) Long Term Goal(s): Improvement in symptoms so as ready for discharge   Short Term Goals: Ability to identify and develop effective coping behaviors will improve Ability to maintain clinical measurements within normal limits will improve Compliance with prescribed medications will improve Ability to identify triggers associated with substance abuse/mental health issues will improve Ability to identify changes in lifestyle to reduce recurrence of condition will improve Ability to verbalize feelings will improve Ability to disclose and discuss suicidal  ideas Ability to demonstrate self-control will improve  Medication Management: Evaluate patient's response, side effects, and tolerance of medication regimen.  Therapeutic Interventions: 1 to 1 sessions, Unit Group sessions and Medication administration.  Evaluation of Outcomes: Progressing  Physician Treatment Plan for Secondary Diagnosis: Principal Problem:   Schizoaffective disorder, bipolar type (HCC) Active Problems:   Substance-induced psychotic disorder (HCC)  Long Term Goal(s): Improvement in symptoms so as ready for discharge   Short Term Goals: Ability to identify and develop effective coping behaviors will improve Ability to maintain clinical measurements within normal limits will improve Compliance with prescribed medications will improve Ability to identify triggers associated with substance abuse/mental health issues will improve Ability to identify changes in lifestyle to reduce recurrence of condition will improve Ability to verbalize feelings will improve Ability to disclose and discuss suicidal ideas Ability to demonstrate self-control will improve     Medication Management: Evaluate patient's response, side effects, and tolerance of medication regimen.  Therapeutic Interventions: 1 to 1 sessions, Unit Group sessions and Medication administration.  Evaluation of Outcomes: Progressing   RN Treatment Plan for Primary Diagnosis: Schizoaffective disorder, bipolar type (HCC) Long Term Goal(s): Knowledge of disease and therapeutic regimen to maintain health will improve  Short Term Goals: Ability to remain free from injury will improve, Ability to verbalize frustration and anger appropriately will improve, Ability to demonstrate self-control, Ability to participate in decision making will improve, Ability to verbalize feelings will improve, Ability to disclose and discuss suicidal ideas, Ability to identify and develop effective coping behaviors will improve, and  Compliance with prescribed medications will improve  Medication Management: RN will administer medications as ordered by provider, will assess and evaluate patient's response and provide education to patient for prescribed medication. RN will report any adverse and/or side effects to prescribing provider.  Therapeutic Interventions: 1 on 1 counseling sessions, Psychoeducation, Medication administration, Evaluate responses to treatment, Monitor vital signs and CBGs as ordered, Perform/monitor CIWA, COWS, AIMS and Fall Risk screenings as ordered, Perform wound care treatments as ordered.  Evaluation of Outcomes: Progressing   LCSW Treatment Plan for Primary Diagnosis: Schizoaffective disorder, bipolar type (HCC) Long Term Goal(s): Safe transition to appropriate next level of care at discharge, Engage patient in therapeutic group addressing interpersonal concerns.  Short Term Goals: Engage patient in aftercare planning with referrals and resources, Increase social support, Increase ability to appropriately verbalize feelings, Increase emotional regulation, Facilitate acceptance of mental health diagnosis and concerns, Facilitate patient progression through stages of change regarding substance use diagnoses and concerns, Identify triggers associated with mental health/substance abuse issues, and Increase skills for wellness and recovery  Therapeutic Interventions: Assess for all discharge needs, 1 to 1 time with Social worker, Explore available resources and support systems, Assess for adequacy in community  support network, Educate family and significant other(s) on suicide prevention, Complete Psychosocial Assessment, Interpersonal group therapy.  Evaluation of Outcomes: Progressing   Progress in Treatment: Attending groups: Yes. Participating in groups: Yes. Taking medication as prescribed: Yes. Toleration medication: No. Family/Significant other contact made: No, will contact:  Consents  Pending Patient understands diagnosis: No. Discussing patient identified problems/goals with staff: Yes. Medical problems stabilized or resolved: Yes. Denies suicidal/homicidal ideation: Yes. Issues/concerns per patient self-inventory: Yes. Other: N/A  New problem(s) identified: No, Describe:  None Reported  New Short Term/Long Term Goal(s): stabilization, elimination of SI thoughts, development of comprehensive mental wellness plan.  medication   Patient Goals:  Medication Stabilization  Discharge Plan or Barriers: CSW will continue to follow and assess for appropriate referrals and possible discharge planning.   Reason for Continuation of Hospitalization: Aggression Delusions  Hallucinations Medication stabilization Withdrawal symptoms  Estimated Length of Stay: 5-7 Days  Last 3 [place] Suicide Severity Risk Score: Flowsheet Row [facility] (Current[facility] Most recent reading at 03/09/2023  2:00 PM ED from 03/09/2023 in [facility] Most recent reading at 03/09/2023  9:44 AM ED from 01/13/2023 in [facility] Most recent reading at 01/13/2023  2:16 AM  C-SSRS RISK CATEGORY No Risk No Risk No Risk       Last PHQ 2/9 Scores:     No data to display         detox, medication management for mood stabilization; elimination of SI thoughts; development of comprehensive mental wellness/sobriety plan    Scribe for Treatment Team: Ane Payment, LCSW 04/07/2023 3:00 PM

## 2023-04-07 NOTE — Progress Notes (Addendum)
[facility] MD Progress Note  04/07/2023 11:36 AM [name] [name]  MRN:  [id number] Subjective:   [name] [name] is a 42 yr old male who presented on 9/3 to [facility] with delusions and paranoia, he was admitted to [facility] on 9/4.  PPHx is significant for Schizoaffective Disorder, Bipolar Type, Anxiety, Depression, and Polysubstance Abuse (Cocaine, Meth, EtOH, and an extensive history of aggression, and 6 Prior Psychiatric Hospitalizations (last [facility]- 10/2022), and no history of Suicide Attempts or Self Injurious Behavior. He does have an ACT Team- [facility].   Case was discussed in the multidisciplinary team. MAR was reviewed and patient was compliant with medications.  He received PRN Hydroxyzine 10/1 for anxiety.  Staff reports patient continues to present calm with no agitation incidents or being intrusive, no episodes of hypersexuality or sexually inappropriateness noted, per chart review last the as needed agitation protocol was given on 9/20    Today Patient was evaluated in his room he reports fair sleep and appetite, continues to report compliance with medications and denies side effect event, he does not appear sleepy this morning and does not display any sign consistent with EPS or TD.  He continues to deny SI HI or AVH he reports he spoke with his mother yesterday and phone call went well.  He tells me that he plans to go back living with his mother and brother.  ACT team staff is coming to visit him on Thursday to assess if he is back to baseline and if they agree with outpatient follow-up after discharge from here.  Patient received first Haldol LAI injection yesterday 100 mg with no issues or side effects reported.   I attempted to contact patient's mother today after the phone call they had yesterday to assess her impression of his improvement and to discuss discharge planning but no response.   Principal Problem: Schizoaffective disorder, bipolar type (HCC) Diagnosis: Principal Problem:    Schizoaffective disorder, bipolar type (HCC) Active Problems:   Substance-induced psychotic disorder (HCC)  Total Time spent with patient:  I personally spent 35 minutes on the unit in direct patient care. The direct patient care time included face-to-face time with the patient, reviewing the patient's chart, communicating with other professionals, and coordinating care. Greater than 50% of this time was spent in counseling or coordinating care with the patient regarding goals of hospitalization, psycho-education, and discharge planning needs.   Past Psychiatric History: Schizoaffective Disorder, Bipolar Type, Anxiety, Depression, and Polysubstance Abuse (Cocaine, Meth, EtOH, and an extensive history of aggression, and 6 Prior Psychiatric Hospitalizations (last [facility]- 10/2022), and no history of Suicide Attempts or Self Injurious Behavior.  He does have an ACT Team- [facility].  Past Medical History:  Past Medical History:  Diagnosis Date   Anxiety    Depression    Mental disorder    Schizophrenia (HCC)    History reviewed. No pertinent surgical history. Family History:  Family History  Problem Relation Age of Onset   Mental illness Neg Hx    Family Psychiatric  History: Reports None 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, Pt uses meth    Social History   Socioeconomic History   Marital status: Married    Spouse name: Not on file   Number  of children: 1   Years of education: Not on file   Highest education level: Not on file  Occupational History   Occupation: Disabled  Tobacco Use   Smoking status: Every Day    Current packs/day: 1.00    Types: Cigarettes   Smokeless tobacco: Former  Building services engineer status: Unknown  Substance and Sexual Activity    Alcohol use: Not Currently    Comment: haven't drank in months"   Drug use: Yes    Types: Marijuana, Methamphetamines    Comment: 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 Pt lives with mother who IVC'd him.      Pt lives with parents and sister.  He is married, but currently separated.  Pt is on disability.   Social Determinants of Health   Financial Resource Strain: Not on file  Food Insecurity: Patient Unable To Answer (03/09/2023)   Hunger Vital Sign    Worried About Running Out of Food in the Last Year: Patient unable to answer    Ran Out of Food in the Last Year: Patient unable to answer  Transportation Needs: Patient Unable To Answer (03/09/2023)   PRAPARE - Administrator, Civil Service (Medical): Patient unable to answer    Lack of Transportation (Non-Medical): Patient unable to answer  Physical Activity: Not on file  Stress: Not on file  Social Connections: Not on file   Additional Social History:                         Sleep: Good   Appetite:  Good  Current Medications: Current Facility-Administered Medications  Medication Dose Route Frequency Provider Last Rate Last Admin   acetaminophen (TYLENOL) tablet 650 mg  650 mg Oral Q6H PRN Ardis Hughs, NP   650 mg at 04/06/23 0549   alum & mag hydroxide-simeth (MAALOX/MYLANTA) 200-200-20 MG/5ML suspension 30 mL  30 mL Oral Q4H PRN Ardis Hughs, NP       benztropine (COGENTIN) tablet 1 mg  1 mg Oral BID Abbott Pao, Sherrin Stahle, MD   1 mg at 04/07/23 0801   diphenhydrAMINE (BENADRYL) capsule 50 mg  50 mg Oral TID PRN Armandina Stammer I, NP   50 mg at 03/26/23 0311   OLANZapine (ZYPREXA) injection 5 mg  5 mg Intramuscular TID PRN Bobbitt, Shalon E, NP   5 mg at 03/25/23 1350   And   diphenhydrAMINE (BENADRYL) injection 50 mg  50 mg Intramuscular  TID PRN Bobbitt, Shalon E, NP   50 mg at 03/25/23 1350   gabapentin (NEURONTIN) capsule 300 mg  300 mg Oral TID Armandina Stammer I, NP   300 mg at 04/07/23 0801   haloperidol (HALDOL) 2 MG/ML solution 10 mg  10 mg Oral TID Abbott Pao, Devarion Mcclanahan, MD   10 mg at 04/07/23 0800   hydrOXYzine (ATARAX) tablet 50 mg  50 mg Oral Q4H PRN Armandina Stammer I, NP   50 mg at 04/06/23 2028   OLANZapine (ZYPREXA) tablet 5 mg  5 mg Oral TID PRN Armandina Stammer I, NP   5 mg at 03/26/23 7846   And   LORazepam (ATIVAN) tablet 1 mg  1 mg Oral TID PRN Armandina Stammer I, NP   1 mg at 03/26/23 0311   magnesium hydroxide (  MILK OF MAGNESIA) suspension 30 mL  30 mL Oral Daily PRN Ardis Hughs, NP       nicotine (NICODERM CQ - dosed in mg/24 hours) patch 14 mg  14 mg Transdermal Daily Nkwenti, Tyler Aas, NP   14 mg at 03/30/23 0808   OLANZapine zydis (ZYPREXA) disintegrating tablet 20 mg  20 mg Oral QHS Lauro Franklin, MD   20 mg at 04/06/23 2028   temazepam (RESTORIL) capsule 15 mg  15 mg Oral QHS Lauro Franklin, MD   15 mg at 04/06/23 2029   traZODone (DESYREL) tablet 300 mg  300 mg Oral QHS Armandina Stammer I, NP   300 mg at 04/06/23 2029   valproic acid (DEPAKENE) 250 MG/5ML solution 1,000 mg  1,000 mg Oral BID Lauro Franklin, MD   1,000 mg at 04/07/23 0800    Lab Results:  No results found for this or any previous visit (from the past 48 hour(s)).    Blood Alcohol level:  Lab Results  Component Value Date   ETH <10 03/09/2023   ETH <10 10/05/2022    Metabolic Disorder Labs: Lab Results  Component Value Date   HGBA1C 5.7 (H) 10/07/2022   MPG 117 10/07/2022   MPG 114 10/05/2022   Lab Results  Component Value Date   PROLACTIN 17.4 10/05/2022   PROLACTIN 45.8 (H) 12/19/2017   Lab Results  Component Value Date   CHOL 156 10/05/2022   TRIG 116 10/05/2022   HDL 40 (L) 10/05/2022   CHOLHDL 3.9 10/05/2022   VLDL 23 10/05/2022   LDLCALC 93 10/05/2022   LDLCALC 69 11/21/2020    Physical Findings: AIMS:  Facial and Oral Movements Muscles of Facial Expression: None, normal Lips and Perioral Area: None, normal Jaw: None, normal Tongue: None, normal,Extremity Movements Upper (arms, wrists, hands, fingers): None, normal Lower (legs, knees, ankles, toes): None, normal, Trunk Movements Neck, shoulders, hips: None, normal, Overall Severity Severity of abnormal movements (highest score from questions above): None, normal Incapacitation due to abnormal movements: None, normal Patient's awareness of abnormal movements (rate only patient's report): No Awareness, Dental Status Current problems with teeth and/or dentures?: No Does patient usually wear dentures?: No  CIWA:    COWS:     Musculoskeletal: Strength & Muscle Tone: within normal limits Gait & Station: normal Patient leans: N/A  Psychiatric Specialty Exam:  Presentation  General Appearance:  Appropriate for Environment; Casual  Eye Contact: Fair  Speech: Clear and Coherent  Speech Volume: Normal  Handedness: Right   Mood and Affect  Mood: Euthymic  Affect: Appropriate; Congruent   Thought Process  Thought Processes: Linear  Descriptions of Associations:Intact  Orientation:Full (Time, Place and Person)  Thought Content:Rumination  History of Schizophrenia/Schizoaffective disorder:Yes  Duration of Psychotic Symptoms:Greater than six months  Hallucinations:No data recorded    Ideas of Reference:None  Suicidal Thoughts:No data recorded    Homicidal Thoughts:No data recorded     Sensorium  Memory: Immediate Fair; Recent Fair  Judgment: Fair  Insight: Fair   Art therapist  Concentration: Fair  Attention Span: Fair  Recall: [employer] of Knowledge: Fair  Language: Fair   Psychomotor Activity  Psychomotor Activity:No data recorded     Assets  Assets: Desire for Improvement; Resilience   Sleep  Sleep:No data recorded      Physical Exam: Physical  Exam Vitals and nursing note reviewed.  Constitutional:      General: He is not in acute distress.    Appearance: Normal appearance.  He is normal weight. He is not ill-appearing or toxic-appearing.  HENT:     Head: Normocephalic and atraumatic.  Pulmonary:     Effort: Pulmonary effort is normal.  Musculoskeletal:        General: Normal range of motion.  Neurological:     General: No focal deficit present.     Mental Status: He is alert.    Review of Systems  Respiratory:  Negative for cough and shortness of breath.   Cardiovascular:  Negative for chest pain.  Gastrointestinal:  Negative for abdominal pain, constipation, diarrhea, nausea and vomiting.  Neurological:  Negative for dizziness, weakness and headaches.  Psychiatric/Behavioral:  Negative for depression, hallucinations and suicidal ideas. The patient is not nervous/anxious.   All other systems reviewed and are negative.  Blood pressure 129/66, pulse 87, temperature 97.9 F (36.6 C), temperature source Oral, resp. rate 18, height 5\' 3"  (1.6 m), weight 70 kg, SpO2 97%. Body mass index is 27.35 kg/m.   Treatment Plan Summary: Daily contact with patient to assess and evaluate symptoms and progress in treatment and Medication management  [name] [name] is a 42 yr old male who presented on 9/3 to [facility] with delusions and paranoia, he was admitted to [facility] on 9/4.  PPHx is significant for Schizoaffective Disorder, Bipolar Type, Anxiety, Depression, and Polysubstance Abuse (Cocaine, Meth, EtOH, and an extensive history of aggression, and 6 Prior Psychiatric Hospitalizations (last [facility]- 10/2022), and no history of Suicide Attempts or Self Injurious Behavior. He does have an ACT Team- [facility].    Troyce has maintained relatively well without 1:1 monitoring and has not any behavioral issues.  Due to this we will Safety Plan with his mother and will plan for discharge tomorrow.  We will not make any changes to his medications at this  time.  We will continue to monitor.    Schizoaffective Disorder, Bipolar Type: -Continue Haldol 10 mg TID for psychosis and mood stability He received Haldol decanoate LAI 100 mg on 10/1 and will receive 200 mg on Thursday or Friday 10/3 or 4 to improve long-term compliance and decrease risk of decompensation. - continue Zyprexa 20 mg QHS for psychosis and mood stability -Continue Depakene 1000 mg BID for mood stability. -Continue Cogentin 1 mg BID for drug induced EPS -Continue Agitation Protocol: Zyprexa/Ativan, Zyprexa.Benadryl, and Benadryl   Nicotine Dependence: -Continue Nicotine Patch 14 mg daily   -Continue Restoril 15 mg QHS for insomnia -Continue Gabapentin 300 mg TID  -Continue Trazodone 300 mg QHS for sleep -Continue PRN's: Tylenol, Maalox, Atarax, Milk of Magnesia   Namiyah Grantham Abbott Pao, MD 04/07/2023, 11:36 AM

## 2023-04-07 NOTE — [facility] Group Notes (Signed)
[facility] Group Notes:  (Nursing/MHT/Case Management/Adjunct)  Date:  04/07/2023  Time:  9:27 PM  Type of Therapy:   Wrap-up group  Participation Level:  Active  Participation Quality:  Appropriate  Affect:  Appropriate  Cognitive:  Appropriate  Insight:  Appropriate  Engagement in Group:  Engaged  Modes of Intervention:  Education  Summary of Progress/Problems: Pt goal to stay calm, discussed coping skills. He rated day 7/10.  Noah Delaine 04/07/2023, 9:27 PM

## 2023-04-08 DIAGNOSIS — F250 Schizoaffective disorder, bipolar type: Secondary | ICD-10-CM | POA: Diagnosis not present

## 2023-04-08 MED ORDER — HALOPERIDOL DECANOATE 100 MG/ML IM SOLN: 300.0000 mg | INTRAMUSCULAR | Status: DC

## 2023-04-08 MED ORDER — HALOPERIDOL DECANOATE 100 MG/ML IM SOLN
200.0000 mg | Freq: Once | INTRAMUSCULAR | Status: AC
  Administered 2023-04-08: 200 mg via INTRAMUSCULAR
  Filled 2023-04-08: qty 2

## 2023-04-08 NOTE — Progress Notes (Signed)
   04/08/23 2015  Psych Admission Type (Psych Patients Only)  Admission Status Involuntary  Psychosocial Assessment  Patient Complaints None  Eye Contact Fair  Facial Expression Anxious  Affect Appropriate to circumstance  Speech Soft;Rhyming  Interaction Cautious  Motor Activity Fidgety;Slow;Pacing  Appearance/Hygiene Unremarkable;Layered clothes  Behavior Characteristics Cooperative  Mood Pleasant  Aggressive Behavior  Effect No apparent injury  Thought Process  Coherency Circumstantial;Disorganized  Content Preoccupation  Delusions WDL  Perception WDL  Hallucination None reported or observed  Judgment Poor  Confusion WDL  Danger to Self  Current suicidal ideation? Denies  Danger to Others  Danger to Others None reported or observed

## 2023-04-08 NOTE — Progress Notes (Signed)
   04/08/23 1200  Psych Admission Type (Psych Patients Only)  Admission Status Involuntary  Psychosocial Assessment  Patient Complaints None  Eye Contact Fair  Facial Expression Anxious  Affect Appropriate to circumstance  Speech Soft  Interaction Cautious  Motor Activity Pacing  Appearance/Hygiene Unremarkable  Behavior Characteristics Cooperative  Mood Pleasant  Thought Process  Coherency Circumstantial  Content Preoccupation  Delusions None reported or observed  Perception Hallucinations  Hallucination Auditory  Judgment Poor  Confusion None  Danger to Self  Current suicidal ideation? Denies  Danger to Others  Danger to Others None reported or observed

## 2023-04-08 NOTE — Progress Notes (Signed)
CSW met with [name] [name] and discussed discharge planning, for 04-09-2023. Pt will be returning to his residence and will resume ACTT services upon discharge.

## 2023-04-08 NOTE — Plan of Care (Signed)
°  Problem: Education: °Goal: Emotional status will improve °Outcome: Progressing °Goal: Mental status will improve °Outcome: Progressing °  °Problem: Activity: °Goal: Interest or engagement in activities will improve °Outcome: Progressing °  °

## 2023-04-08 NOTE — Plan of Care (Signed)

## 2023-04-08 NOTE — Group Note (Signed)
Date:  04/08/2023 Time:  8:27 PM  Group Topic/Focus:  Wrap-Up Group:   The focus of this group is to help patients review their daily goal of treatment and discuss progress on daily workbooks.    Participation Level:  Did Not Attend  Participation Quality:   N/A  Affect:   N/A  Cognitive:   N/A  Insight: None  Engagement in Group:   N/A  Modes of Intervention:   N/A  Additional Comments:  Patient did not attend group.   [name] [name] 04/08/2023, 8:27 PM

## 2023-04-08 NOTE — Group Note (Signed)
Recreation Therapy Group Note   Group Topic:Stress Management  Group Date: 04/08/2023 Start Time: 1011 End Time: 1031 Facilitators: Lillee Mooneyhan-McCall, LRT,CTRS Location: [address] Dayroom   Group Topic: Stress Management  Goal Area(s) Addresses:  Patient will identify positive stress management techniques. Patient will identify benefits of using stress management post d/c.  Group Description: Meditation. LRT and patients discussed the impact meditation can have on helping to relax into a calming mindset. LRT then played a meditation that focused on being patient. LRT then gave patients a worksheet that focused on finding peace as group ended to take with them  Education:  Stress Management, Discharge Planning.   Education Outcome: Acknowledges Education   Affect/Mood: N/A   Participation Level: Did not attend    Clinical Observations/Individualized Feedback:     Plan: Continue to engage patient in RT group sessions 2-3x/week.   [name] [name], LRT,CTRS  04/08/2023 11:49 AM

## 2023-04-08 NOTE — Progress Notes (Signed)
[facility] MD Progress Note  04/08/2023 10:08 AM [name] [name]  MRN:  [id number] Subjective:   [name] [name] is a 42 yr old male who presented on 9/3 to [facility] with delusions and paranoia, he was admitted to [facility] on 9/4.  PPHx is significant for Schizoaffective Disorder, Bipolar Type, Anxiety, Depression, and Polysubstance Abuse (Cocaine, Meth, EtOH, and an extensive history of aggression, and 6 Prior Psychiatric Hospitalizations (last [facility]- 10/2022), and no history of Suicide Attempts or Self Injurious Behavior. He does have an ACT Team- [facility].   Case was discussed in the multidisciplinary team. MAR was reviewed and patient was compliant with medications.  He receives PRN Hydroxyzine almost daily for anxiety. Per chart review Depakote level was obtained while on current dose on 9/27, therapeutic at 68  Staff reports patient continues to present calm with no agitation or irritability noted, no episodes of hypersexuality or sexually inappropriateness noted, per chart review last as needed agitation protocol was given on 9/20    Today Patient was evaluated in his room he was primarily lying down in bed asleep but easily awakened, he reports he had a fair day yesterday attended groups, he reports good sleep and appetite he reports improving drowsiness from medications since his stop morning dose of Zyprexa, he denies any other side effects of medications and does not display any sign consistent with EPS or TD except for slight hand tremors not affecting patient's ability to function, but no stiffness or cogwheel rigidity noted during exam..  When asked if he does agree to continue compliance with medications after discharge "they help me" he tells me medications helped him to think straight, he continues to deny any SI HI or AVH, he tells me last time he was hearing voices or seeing dragons was at least a week ago.  He denies feeling depressed or anxious.  Discussed with patient ACT team staff plan to visit him  today to assess improvement with discharge plan for tomorrow if continues to do well, he agrees.    I attempted to contact patient's mother today again at [phone] to discuss improvement and discharge planning but no response, phone call was attempted at least twice at different times.    Principal Problem: Schizoaffective disorder, bipolar type (HCC) Diagnosis: Principal Problem:   Schizoaffective disorder, bipolar type (HCC) Active Problems:   Substance-induced psychotic disorder (HCC)  Total Time spent with patient:  I personally spent 35 minutes on the unit in direct patient care. The direct patient care time included face-to-face time with the patient, reviewing the patient's chart, communicating with other professionals, and coordinating care. Greater than 50% of this time was spent in counseling or coordinating care with the patient regarding goals of hospitalization, psycho-education, and discharge planning needs.   Past Psychiatric History: Schizoaffective Disorder, Bipolar Type, Anxiety, Depression, and Polysubstance Abuse (Cocaine, Meth, EtOH, and an extensive history of aggression, and 6 Prior Psychiatric Hospitalizations (last [facility]- 10/2022), and no history of Suicide Attempts or Self Injurious Behavior.  He does have an ACT Team- [facility].  Past Medical History:  Past Medical History:  Diagnosis Date   Anxiety    Depression    Mental disorder    Schizophrenia (HCC)    History reviewed. No pertinent surgical history. Family History:  Family History  Problem Relation Age of Onset   Mental illness Neg Hx    Family Psychiatric  History: Reports None Social History:  Social History   Substance and Sexual Activity  Alcohol Use  Not Currently   Comment: haven't drank in months"     Social History   Substance and Sexual Activity  Drug Use Yes   Types: Marijuana, Methamphetamines   Comment: Per sister, Pt uses meth    Social History   Socioeconomic History    Marital status: Married    Spouse name: Not on file   Number of children: 1   Years of education: Not on file   Highest education level: Not on file  Occupational History   Occupation: Disabled  Tobacco Use   Smoking status: Every Day    Current packs/day: 1.00    Types: Cigarettes   Smokeless tobacco: Former  Building services engineer status: Unknown  Substance and Sexual Activity   Alcohol use: Not Currently    Comment: haven't drank in months"   Drug use: Yes    Types: Marijuana, Methamphetamines    Comment: 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 Pt lives with mother who IVC'd him.      Pt lives with parents and sister.  He is married, but currently separated.  Pt is on disability.   Social Determinants of Health   Financial Resource Strain: Not on file  Food Insecurity: Patient Unable To Answer (03/09/2023)   Hunger Vital Sign    Worried About Running Out of Food in the Last Year: Patient unable to answer    Ran Out of Food in the Last Year: Patient unable to answer  Transportation Needs: Patient Unable To Answer (03/09/2023)   PRAPARE - Administrator, Civil Service (Medical): Patient unable to answer    Lack of Transportation (Non-Medical): Patient unable to answer  Physical Activity: Not on file  Stress: Not on file  Social Connections: Not on file   Additional Social History:                         Sleep: Good   Appetite:  Good  Current Medications: Current Facility-Administered Medications  Medication Dose Route Frequency Provider Last Rate Last Admin   acetaminophen (TYLENOL) tablet 650 mg  650 mg Oral Q6H PRN Ardis Hughs, NP   650 mg at 04/06/23 0549   alum & mag hydroxide-simeth (MAALOX/MYLANTA) 200-200-20 MG/5ML suspension 30 mL  30 mL Oral Q4H PRN Ardis Hughs,  NP       benztropine (COGENTIN) tablet 1 mg  1 mg Oral BID Abbott Pao, Cosby Proby, MD   1 mg at 04/08/23 0842   diphenhydrAMINE (BENADRYL) capsule 50 mg  50 mg Oral TID PRN Armandina Stammer I, NP   50 mg at 03/26/23 0311   OLANZapine (ZYPREXA) injection 5 mg  5 mg Intramuscular TID PRN Bobbitt, Shalon E, NP   5 mg at 03/25/23 1350   And   diphenhydrAMINE (BENADRYL) injection 50 mg  50 mg Intramuscular TID PRN Bobbitt, Shalon E, NP   50 mg at 03/25/23 1350   gabapentin (NEURONTIN) capsule 300 mg  300 mg Oral TID Armandina Stammer I, NP   300 mg at 04/08/23 0842   haloperidol (HALDOL) 2 MG/ML solution 10 mg  10 mg Oral TID Sarita Bottom, MD   10 mg at 04/08/23 0842   hydrOXYzine (ATARAX) tablet 50 mg  50 mg Oral Q4H PRN Sanjuana Kava,  NP   50 mg at 04/07/23 2037   OLANZapine (ZYPREXA) tablet 5 mg  5 mg Oral TID PRN Armandina Stammer I, NP   5 mg at 03/26/23 8295   And   LORazepam (ATIVAN) tablet 1 mg  1 mg Oral TID PRN Armandina Stammer I, NP   1 mg at 03/26/23 6213   magnesium hydroxide (MILK OF MAGNESIA) suspension 30 mL  30 mL Oral Daily PRN Ardis Hughs, NP       nicotine (NICODERM CQ - dosed in mg/24 hours) patch 14 mg  14 mg Transdermal Daily Starleen Blue, NP   14 mg at 03/30/23 0808   OLANZapine zydis (ZYPREXA) disintegrating tablet 20 mg  20 mg Oral QHS Lauro Franklin, MD   20 mg at 04/07/23 2037   temazepam (RESTORIL) capsule 15 mg  15 mg Oral QHS Lauro Franklin, MD   15 mg at 04/07/23 2037   traZODone (DESYREL) tablet 300 mg  300 mg Oral QHS Armandina Stammer I, NP   300 mg at 04/07/23 2037   valproic acid (DEPAKENE) 250 MG/5ML solution 1,000 mg  1,000 mg Oral BID Lauro Franklin, MD   1,000 mg at 04/08/23 0865    Lab Results:  No results found for this or any previous visit (from the past 48 hour(s)).    Blood Alcohol level:  Lab Results  Component Value Date   ETH <10 03/09/2023   ETH <10 10/05/2022    Metabolic Disorder Labs: Lab Results  Component Value Date   HGBA1C 5.7  (H) 10/07/2022   MPG 117 10/07/2022   MPG 114 10/05/2022   Lab Results  Component Value Date   PROLACTIN 17.4 10/05/2022   PROLACTIN 45.8 (H) 12/19/2017   Lab Results  Component Value Date   CHOL 156 10/05/2022   TRIG 116 10/05/2022   HDL 40 (L) 10/05/2022   CHOLHDL 3.9 10/05/2022   VLDL 23 10/05/2022   LDLCALC 93 10/05/2022   LDLCALC 69 11/21/2020    Physical Findings: AIMS: Facial and Oral Movements Muscles of Facial Expression: None, normal Lips and Perioral Area: None, normal Jaw: None, normal Tongue: None, normal,Extremity Movements Upper (arms, wrists, hands, fingers): None, normal Lower (legs, knees, ankles, toes): None, normal, Trunk Movements Neck, shoulders, hips: None, normal, Overall Severity Severity of abnormal movements (highest score from questions above): None, normal Incapacitation due to abnormal movements: None, normal Patient's awareness of abnormal movements (rate only patient's report): No Awareness, Dental Status Current problems with teeth and/or dentures?: No Does patient usually wear dentures?: No  CIWA:    COWS:     Musculoskeletal: Strength & Muscle Tone: within normal limits Gait & Station: normal Patient leans: N/A  Psychiatric Specialty Exam:  Presentation  General Appearance:  Appropriate for Environment; Casual  Eye Contact: Fair  Speech: Clear and Coherent  Speech Volume: Normal  Handedness: Right   Mood and Affect  Mood: Euthymic  Affect: Appropriate; Congruent   Thought Process  Thought Processes: Linear  Descriptions of Associations:Intact  Orientation:Full (Time, Place and Person)  Thought Content:Rumination  History of Schizophrenia/Schizoaffective disorder:Yes  Duration of Psychotic Symptoms:Greater than six months  Hallucinations:No data recorded    Ideas of Reference:None  Suicidal Thoughts:No data recorded    Homicidal Thoughts:No data recorded     Sensorium   Memory: Immediate Fair; Recent Fair  Judgment: Fair  Insight: Fair   Chartered certified accountant: Fair  Attention Span: Fair  Recall: [employer] of Knowledge: Fair  Language: Fair   Psychomotor Activity  Psychomotor Activity:No data recorded     Assets  Assets: Desire for Improvement; Resilience   Sleep  Sleep:No data recorded      Physical Exam: Physical Exam Vitals and nursing note reviewed.  Constitutional:      General: He is not in acute distress.    Appearance: Normal appearance. He is normal weight. He is not ill-appearing or toxic-appearing.  HENT:     Head: Normocephalic and atraumatic.  Pulmonary:     Effort: Pulmonary effort is normal.  Musculoskeletal:        General: Normal range of motion.  Neurological:     General: No focal deficit present.     Mental Status: He is alert.    Review of Systems  Respiratory:  Negative for cough and shortness of breath.   Cardiovascular:  Negative for chest pain.  Gastrointestinal:  Negative for abdominal pain, constipation, diarrhea, nausea and vomiting.  Neurological:  Negative for dizziness, weakness and headaches.  Psychiatric/Behavioral:  Negative for depression, hallucinations and suicidal ideas. The patient is not nervous/anxious.   All other systems reviewed and are negative.  Blood pressure 138/87, pulse 81, temperature 97.9 F (36.6 C), temperature source Oral, resp. rate 18, height 5\' 3"  (1.6 m), weight 70 kg, SpO2 97%. Body mass index is 27.35 kg/m.   Treatment Plan Summary: Daily contact with patient to assess and evaluate symptoms and progress in treatment and Medication management  Yang Rack is a 42 yr old male who presented on 9/3 to [facility] with delusions and paranoia, he was admitted to [facility] on 9/4.  PPHx is significant for Schizoaffective Disorder, Bipolar Type, Anxiety, Depression, and Polysubstance Abuse (Cocaine, Meth, EtOH, and an extensive history of aggression,  and 6 Prior Psychiatric Hospitalizations (last [facility]- 10/2022), and no history of Suicide Attempts or Self Injurious Behavior. He does have an ACT Team- [facility].    Roque has maintained relatively well without 1:1 monitoring and has not any behavioral issues.  Due to this we will Safety Plan with his mother and will plan for discharge tomorrow.  We will not make any changes to his medications at this time.  We will continue to monitor.    Schizoaffective Disorder, Bipolar Type: -Continue Haldol 10 mg TID for psychosis and mood stability He received Haldol decanoate LAI 100 mg on 10/1 and will receive 200 mg today on Thursday 10/3 to improve long-term compliance and decrease risk of decompensation.  This is with plan to start Haldol LAI 300 mg monthly starting 11/4 with recommendation to taper down p.o. Haldol after 3 to 4 weeks. - continue Zyprexa 20 mg QHS for psychosis and mood stability  Please note during this hospitalization patient was refractory to stabilize regarding mood stability and psychosis, did require to have 2 antipsychotics treatment secondary to no or partial response when was treated with only 1 antipsychotic. -Continue Depakene 1000 mg BID for mood stability. -Continue Cogentin 1 mg BID for drug induced EPS -Continue Agitation Protocol: Zyprexa/Ativan, Zyprexa.Benadryl, and Benadryl   Nicotine Dependence: -Continue Nicotine Patch 14 mg daily   -Continue Restoril 15 mg QHS for insomnia -Continue Gabapentin 300 mg TID  -Continue Trazodone 300 mg QHS for sleep -Continue PRN's: Tylenol, Maalox, Atarax, Milk of Magnesia   Guenevere Roorda, MD 04/08/2023, 10:08 AM

## 2023-04-08 NOTE — Group Note (Signed)
Occupational Therapy Group Note  Group Topic: Sleep Hygiene  Group Date: 04/08/2023 Start Time: 1430 End Time: 1502 Facilitators: Ted Mcalpine, OT   Group Description: Group encouraged increased participation and engagement through topic focused on sleep hygiene. Patients reflected on the quality of sleep they typically receive and identified areas that need improvement. Group was given background information on sleep and sleep hygiene, including common sleep disorders. Group members also received information on how to improve one's sleep and introduced a sleep diary as a tool that can be utilized to track sleep quality over a length of time. Group session ended with patients identifying one or more strategies they could utilize or implement into their sleep routine in order to improve overall sleep quality.        Therapeutic Goal(s):  Identify one or more strategies to improve overall sleep hygiene  Identify one or more areas of sleep that are negatively impacted (sleep too much, too little, etc)     Participation Level: Minimal   Participation Quality: Minimal Cues   Behavior: Distracted   Speech/Thought Process: Loose association    Affect/Mood: Flat   Insight: Limited   Judgement: Limited      Modes of Intervention: Education  Patient Response to Interventions:  Disengaged   Plan: Continue to engage patient in OT groups 2 - 3x/week.  04/08/2023  Ted Mcalpine, OT  Kerrin Champagne, OT

## 2023-04-09 DIAGNOSIS — F250 Schizoaffective disorder, bipolar type: Secondary | ICD-10-CM | POA: Diagnosis not present

## 2023-04-09 MED ORDER — BENZTROPINE MESYLATE 1 MG PO TABS: 1.0000 mg | ORAL_TABLET | Freq: Two times a day (BID) | ORAL | 0 refills | Status: DC

## 2023-04-09 MED ORDER — TRAZODONE HCL 300 MG PO TABS: 300.0000 mg | ORAL_TABLET | Freq: Every day | ORAL | 0 refills | Status: DC

## 2023-04-09 MED ORDER — DIVALPROEX SODIUM 500 MG PO DR TAB
1000.0000 mg | DELAYED_RELEASE_TABLET | Freq: Two times a day (BID) | ORAL | 0 refills | Status: AC
Start: 2023-04-09 — End: 2024-04-08

## 2023-04-09 MED ORDER — HALOPERIDOL 10 MG PO TABS
10.0000 mg | ORAL_TABLET | Freq: Three times a day (TID) | ORAL | 0 refills | Status: AC
Start: 2023-04-09 — End: 2024-04-08

## 2023-04-09 MED ORDER — HALOPERIDOL DECANOATE 100 MG/ML IM SOLN: 300.0000 mg | INTRAMUSCULAR | 0 refills | Status: DC

## 2023-04-09 MED ORDER — GABAPENTIN 300 MG PO CAPS: 300.0000 mg | ORAL_CAPSULE | Freq: Three times a day (TID) | ORAL | 0 refills | Status: DC

## 2023-04-09 MED ORDER — OLANZAPINE 20 MG PO TABS: 20.0000 mg | ORAL_TABLET | Freq: Every day | ORAL | 0 refills | Status: DC

## 2023-04-09 MED ORDER — HYDROXYZINE HCL 50 MG PO TABS: 50.0000 mg | ORAL_TABLET | ORAL | 0 refills | Status: DC | PRN

## 2023-04-09 MED ORDER — NICOTINE 14 MG/24HR TD PT24: 14.0000 mg | MEDICATED_PATCH | Freq: Every day | TRANSDERMAL | 0 refills | Status: DC

## 2023-04-09 MED ORDER — TEMAZEPAM 15 MG PO CAPS: 15.0000 mg | ORAL_CAPSULE | Freq: Every day | ORAL | 0 refills | Status: DC

## 2023-04-09 NOTE — Plan of Care (Signed)
Patient was unable to focus on task. Patient was fidgety and restless in the first few sessions of group. Patient was drowsy and had a hard time staying awake in the later group sessions.   Ole Lafon-McCall, LRT, CTRS

## 2023-04-09 NOTE — [facility] Suicide Risk Assessment (Signed)
[facility] Discharge Suicide Risk Assessment   Principal Problem: Schizoaffective disorder, bipolar type [name]) Discharge Diagnoses: Principal Problem:   Schizoaffective disorder, bipolar type (HCC) Active Problems:   Substance-induced psychotic disorder (HCC)   Total Time spent with patient: 45 minutes  Reason for admission: Patient placed under IVC by [employer]/BHRT Erie Noe, "[name] has been diagnosed with schizophrenia, the respondent has been hallucinating he believes he is God and the devil.  [name] is having disorganized and delusional thoughts for example he believes a kitten kissed him while in the back of the patrol car and he believed he was crying tears of blood which was not the case.  In addition the respondent was running around the parking lot in a shopping center swinging a sword, stating he would kill them.  [name] swung a sword at officers stating he would kill them."   PTA Medications:  Medications Prior to Admission  Medication Sig Dispense Refill Last Dose   INVEGA TRINZA 546 MG/1.75ML injection Inject 546 mg into the muscle every 3 (three) months. (Patient not taking: Reported on 03/09/2023)         lithium 300 MG tablet Take 300 mg by mouth daily. (Patient not taking: Reported on 03/09/2023)         OLANZapine (ZYPREXA) 20 MG tablet Take 1 tablet (20 mg total) by mouth at bedtime. For mood control (Patient not taking: Reported on 03/09/2023) 30 tablet 0     OLANZapine (ZYPREXA) 5 MG tablet Take 1 tablet (5 mg total) by mouth 2 (two) times daily.          [name] Course:  Upon this [name] admission: During this evaluation, [name] is observed actively having a conversation with himself. He was sitting in the day room at the time. He presents alert & oriented to himself. He is approachable. He presents delusional, highly psychotic & a poor historian. He is unable make any concrete statements. He is currently unable to identify the reasons that led to his current  hospitals. He is unable to remember his previous medications taken by him. He reports.    "The police took me to the the place I was before I came here on September, 3rd. I don't know why. I have been hurting too much. Please tell them to not make me walk too much. I have been walking everywhere. Everybody is making me do it.. I need cigarettes. I take my medicines, but I forget sometimes. I don't use drugs, I smoke cigarettes".    Per Act team reports: Patient is linked with [name].The [facility] staff reports that patient has been under the care of the [name]. However, reports that patient has not been able to receive his monthly Invega shots that was due on 01-14-23 because he was incarcerated.  The Act team reports that patient's current medications include; Zyprexa 20 mg Q hs, Lithium carbonate 300 mg bid & the Invega 456 mg that he never got. Patient was apparently on two separate antipsychotic medications for his mental health issues.   During the patient's hospitalization, patient had extensive initial psychiatric evaluation, and follow-up psychiatric evaluations every day.  Psychiatric diagnoses provided upon initial assessment: Schizoaffective disorder bipolar type, stimulant use disorder/methamphetamine  Patient's psychiatric medications were adjusted on admission: -Continue Zyprexa 5 mg po q daily for mood instability.  -Initiated Lithium carbonate 300 mg po bid for mood stabilization. -Initiated Zyprexa 15 mg po Q hs for psychosis.  -  Continue hydroxyzine 25 mg po tid prn for anxiety.  -Continue Trazodone 50 mg po Q hs prn for insomnia,  During the hospitalization, other adjustments were made to the patient's psychiatric medication regimen: Lithium was discontinued for lack of efficacy, Zyprexa was titrated to 20 mg at bedtime and 10 mg in the morning then lower down later on during [name] stay to 20 mg only at bedtime secondary to  drowsiness side effect from morning dose.  Trazodone was given scheduled for sleep and titrated up to 300 mg at bedtime, Depakote was started and titrated up to 1000 mg twice daily for mood stabilization and to augment antipsychotic effect, Restoril was added 15 mg at bedtime scheduled to help with sleep.  Secondary to refractory psychosis Haldol was added and titrated up gradually to 10 mg 3 times daily for psychosis and mood stabilization, Cogentin was added 1 mg twice daily for EPS prophylaxis given on 2 antipsychotics.  Later during [name] stay and to help with long-term compliance and decrease risk of decompensation patient was started on Haldol LAI, he was given 100 mg IM on 10/1 and then 200 mg IM on 10/3 with plan to have him receive 300 mg IM monthly starting 11/4.  Patient's care was discussed during the interdisciplinary team meeting every day during the hospitalization.  Please note during first 1 to 2 weeks of patient's hospitalization he was constantly agitated requiring as needed agitation protocol once to twice daily he was also noted to be hypersexual, sexually preoccupied and inappropriate with male staff, he started showing significant improvement in the last 10 days of his hospitalization with decreased need for as needed agitation protocol with last agitation protocol used on 9/20.  He was noted in the last 10 days of his hospitalization to be cooperative and calm with no agitation or aggression episodes no hypersexuality or sexually inappropriateness noted, he was able to identify when he is on importance to comply with medications and follow-up appointment after discharge and to abstain from illicit drug use in order to avoid decompensation and rehospitalization.  At time of discharge he was in agreement to comply with medications including Haldol LAI monthly.  ACT team staff visited with the patient on day prior to discharge and they agreed that he is significantly improved and is  suitable for outpatient care.  ACT team staff communicated with patient's mother prior to discharge and she agreed with plan of discharge with ACT team follow-up closely after discharge to avoid decompensation.  This provider attempted to contact patient's mother in the last 2 to 3 days prior to discharge with no response.  The patient denied having side effects to prescribed psychiatric medication except for some morning drowsiness which led to discontinuing morning dose of Zyprexa, improved afterward.  Gradually, patient started adjusting to milieu. The patient was evaluated each day by a clinical provider to ascertain response to treatment. Improvement was noted by the patient's report of decreasing symptoms, improved sleep and appetite, affect, medication tolerance, behavior, and participation in unit programming.  Patient was asked each day to complete a self inventory noting mood, mental status, pain, new symptoms, anxiety and concerns.    Symptoms were reported as significantly decreased or resolved completely by discharge.   On day of discharge, patient was evaluated on 10//24 the patient reports that their mood is stable.  Patient continues to deny SI HI or AVH, presents linear organized with no paranoia or other delusions no sexual preoccupation aggression or disorganized thought noted.  He displays improved insight and judgment identifies he has a mental illness requiring compliance with medications and follow-up appointment and to abstain from illicit drug after discharge to decrease risk of decompensation and hospitalization.  He presented with linear coherent speech for the most part, fair eye contact without any aggression or agitation to be noted.  He did not presented drowsy or sleepy and did not display any sign consistent with EPS or TD.    The patient denied having suicidal thoughts for more than 48 hours prior to discharge.  Patient denies having homicidal thoughts.  Patient denies  having auditory hallucinations.  Patient denies any visual hallucinations or other symptoms of psychosis. The patient was motivated to continue taking medication with a goal of continued improvement in mental health.   The patient reports their target psychiatric symptoms of agitation, disorganized thought process and paranoia responded well to the psychiatric medications, and the patient reports overall benefit other psychiatric hospitalization. Supportive psychotherapy was provided to the patient. The patient also participated in regular group therapy while hospitalized. Coping skills, problem solving as well as relaxation therapies were also part of the unit programming.  Labs were reviewed with the patient, and abnormal results were discussed with the patient.  The patient is able to verbalize their individual safety plan to this provider.  Behavioral Events: Incidents of agitation during first 2 weeks of hospitalization but subsided gradually then subsided completely without any aggression episodes in the last 10 days of [name] stay.  Restraints: None  Groups: Improved attendance and participation in the last 10 days of [name] stay  Medications Changes: As above  Sleep  Good, improved during [name] stay  Musculoskeletal: Strength & Muscle Tone: within normal limits Gait & Station: normal Patient leans: N/A  Psychiatric Specialty Exam  General Appearance: appears at stated age, fairly dressed and groomed  Behavior: pleasant and cooperative  Psychomotor Activity:No psychomotor agitation or retardation noted   Eye Contact: good Speech: normal amount, tone, volume and latency   Mood: euthymic Affect: congruent, pleasant and interactive  Thought Process: linear, goal directed, no circumstantial or tangential thought process noted, no racing thoughts or flight of ideas Descriptions of Associations: intact Thought Content: Hallucinations: denies AH, VH , does not appear  responding to stimuli Delusions: No paranoia or other delusions noted Suicidal Thoughts: denies SI, intention, plan  Homicidal Thoughts: denies HI, intention, plan   Alertness/Orientation: alert and fully oriented  Insight: fair, improved Judgment: fair, improved  Memory: intact  Executive Functions  Concentration: intact  Attention Span: Fair Recall: intact Fund of Knowledge: fair   Art therapist  Concentration: intact Attention Span: Fair Recall: intact Fund of Knowledge: fair   Assets  Assets: Desire for Improvement; Resilience   Physical Exam: Physical Exam ROS Blood pressure (!) 147/67, pulse 88, temperature (!) 97.5 F (36.4 C), temperature source Oral, resp. rate 18, height 5\' 3"  (1.6 m), weight 70 kg, SpO2 98%. Body mass index is 27.35 kg/m.  Mental Status Per Nursing Assessment::   On Admission:  NA  Demographic Factors:  Male, Low socioeconomic status, and Unemployed  Loss Factors: NA  Historical Factors: Impulsivity  Risk Reduction Factors:   Living with another person, especially a relative  Continued Clinical Symptoms: Symptoms improved significantly during [name] stay Alcohol/Substance Abuse/Dependencies Schizophrenia:   Paranoid or undifferentiated type  Cognitive Features That Contribute To Risk:  None    Suicide Risk:  Minimal: No identifiable suicidal ideation.  Patients presenting with no risk factors but with morbid  ruminations; may be classified as minimal risk based on the severity of the depressive symptoms   Follow-up Information     [name]. Go to.   Specialty: Behavioral Health Why: Please go to this provider for an assessment, for therapy and medication management services.  For fastest service, please go on Monday through Friday, arrive by 7:00 am for same day services. Contact information: [phone] [place] [address] [phone]        [name] Follow up.   Specialty: Behavioral Health Why: Referral made Contact information: [address] [place] [address] [phone]                Plan Of Care/Follow-up recommendations:    Discharge recommendations:     Activity: as tolerated  Diet: heart healthy  # It is recommended to the patient to continue psychiatric medications as prescribed, after discharge from the [name].     # It is recommended to the patient to follow up with your outpatient psychiatric provider and PCP.   # It was discussed with the patient, the impact of alcohol, drugs, tobacco have been there overall psychiatric and medical wellbeing, and total abstinence from substance use was recommended the patient.ed.   # Prescriptions provided or sent directly to preferred pharmacy at discharge. Patient agreeable to plan. Given opportunity to ask questions. Appears to feel comfortable with discharge.    # In the event of worsening symptoms, the patient is instructed to call the crisis hotline, 911 and or go to the nearest ED for appropriate evaluation and treatment of symptoms. To follow-up with primary care provider for other medical issues, concerns and or health care needs   # Patient was discharged home with a plan to follow up as noted above.   Patient agrees with D/C instructions and plan.  The patient received suicide prevention pamphlet:  Yes Belongings returned:  Clothing and Valuables  Total Time Spent in Direct Patient Care:  I personally spent 45 minutes on the unit in direct patient care. The direct patient care time included face-to-face time with the patient, reviewing the patient's chart, communicating with other professionals, and coordinating care. Greater than 50% of this time was spent in counseling or coordinating care with the patient regarding goals of hospitalization, psycho-education, and discharge planning needs.   Pearlean Sabina 04/09/2023, 9:52 AM   Shontae Rosiles  Abbott Pao, MD 04/09/2023, 9:52 AM

## 2023-04-09 NOTE — Group Note (Signed)
Recreation Therapy Group Note   Group Topic:Problem Solving  Group Date: 04/09/2023 Start Time: 1001 End Time: 1030 Facilitators: Ching Rabideau-McCall, LRT,CTRS Location: [address] [employer] Therapy Notes  Group Topic: Communication, Team Building, Problem Solving  Goal Area(s) Addresses:  Patient will effectively work with peer towards shared goal.  Patient will identify skills used to make activity successful.  Patient will identify how skills used during activity can be applied to reach post d/c goals.   Intervention: STEM Activity- Glass blower/designer  Group Description: Tallest Pharmacist, community. In teams of 5-6, patients were given 11 craft pipe cleaners. Using the materials provided, patients were instructed to compete again the opposing team(s) to build the tallest free-standing structure from floor level. The activity was timed; difficulty increased by Clinical research associate as Production designer, theatre/television/film continued.  Systematically resources were removed with additional directions for example, placing one arm behind their back, working in silence, and shape stipulations. LRT facilitated post-activity discussion reviewing team processes and necessary communication skills involved in completion. Patients were encouraged to reflect how the skills utilized, or not utilized, in this activity can be incorporated to positively impact support systems post discharge.  Education: Pharmacist, community, Scientist, physiological, Discharge Planning   Education Outcome: Acknowledges education/In group clarification offered/Needs additional education.    Affect/Mood: N/A   Participation Level: Did not attend    Clinical Observations/Individualized Feedback:    Plan: Continue to engage patient in RT group sessions 2-3x/week.   Jakayden Cancio-McCall, LRT, CTRS 04/09/2023 10:33 AM

## 2023-04-09 NOTE — Discharge Summary (Signed)
Physician Discharge Summary Note  [name] [name]  [name] [name] is an 42 y.o., male MRN:  [id number] DOB:  Oct 13, 1980 [name] [name] phone:  [phone] (home)  [name] [name] address:   [address] [place] [facility] [address],  Total Time spent with [name] [name] 45 minutes  Date of Admission:  03/09/2023 Date of Discharge: 04/09/23  Reason for Admission:  [name] [name] placed under IVC by [facility] E[place] Noe, "[name] has been diagnosed with schizophrenia, the respondent has been hallucinating he believes he is God and the devil. [name] is having disorganized and delusional thoughts for example he believes a kitten kissed him while in the back of the patrol car and he believed he was crying tears of blood which was not the case. In addition the respondent was running around the parking lot in a shopping center swinging a sword, stating he would kill them. [name] swung a sword at officers stating he would kill them."   Principal Problem: Schizoaffective disorder, bipolar type [facility]) Discharge Diagnoses: Principal Problem:   Schizoaffective disorder, bipolar type [facility]) Active Problems:   Substance-induced psychotic disorder [facility])   Past Psychiatric History:  [facility] admissions x multiple times [facility] hospitalizations: 02/28/2020, 01/17/2019 for psychosis, 11/16/2018, 10/30/2018, 07/25/2018,respectively). [name] [name] is linked to the [facility] Act [facility]. (Per previous assessment): Paranoid schizophrenia, anxiety disorder, major depression, stimulant use disorder, Schizoaffective disorder, cannabis use disorder. Has extensive history of aggression as per chart review especially when under the influence of drugs.   Past Medical History:  Past Medical History:  Diagnosis Date   Anxiety    Depression    Mental disorder    Schizophrenia (HCC)    History reviewed. No pertinent surgical history.  Family History:  Family History  Problem Relation Age of Onset   Mental illness Neg Hx    Family  Psychiatric  History:  None noted 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, [name] [name] uses meth    Social History   Socioeconomic History   Marital status: Married    Spouse name: Not on file   Number of children: 1   Years of education: Not on file   Highest education level: Not on file  Occupational History   Occupation: Disabled  Tobacco Use   Smoking status: Every Day    Current packs/day: 1.00    Types: Cigarettes   Smokeless tobacco: Former  Building services engineer status: Unknown  Substance and Sexual Activity   Alcohol use: Not Currently    Comment: haven't drank in months"   Drug use: Yes    Types: Marijuana, Methamphetamines    Comment: Per sister, [name] [name] uses meth   Sexual activity: Yes    Birth control/protection: None  Other Topics Concern   Not on file  Social History Narrative   [name] [name] refusing to answer any questions during assessment. [name] [name] lives with mother who IVC'd him.      [name] [name] lives with parents and sister.  He is married, but currently separated.  [name] [name] is on disability.   Social Determinants of Health   Financial Resource Strain: Not on file  Food Insecurity: [name] [name] (03/09/2023)   Hunger Vital Sign    Worried About Running Out of Food in the Last Year: [name] [name] unable to  answer    Ran Out of Food in the Last Year: [name] [name]  Transportation Needs: [name] [name] (03/09/2023)   PRAPARE - Administrator, Civil Service (Medical): [name] [name]    Lack of Transportation (Non-Medical): [name] [name]  Physical Activity: Not on file  Stress: Not on file  Social Connections: Not on file   [name] [name] reports that he lives with his mother and brother, states that he has 1 child and is divorced.  States that his child is 62 years old. Reports highest level of education as being high school. States that he has a GED. 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   Substance Use History:  Tested positive for meth at time of admission  Hospital Course:   Upon this [facility] admission: During this evaluation, [place] is observed actively having a conversation with himself. He was sitting in the day room at the time. He presents alert & oriented to himself. He is approachable. He presents delusional, highly psychotic & a poor historian. He is unable make any concrete statements. He is currently unable to identify the reasons that led to his current hospitals. He is unable to remember his previous medications taken by him. He reports.    "The police took me to the the place I was before I came here on September, 3rd. I don't know why. I have been hurting too much. Please tell them to not make me walk too much. I have been walking everywhere. Everybody is making me do it.. I need cigarettes. I take my medicines, but I forget sometimes. I don't use drugs, I smoke cigarettes".    Per Act team reports: [name] [name] is linked with [name].The [facility] staff reports that [name] [name] has been under the care of the [place] [facility]. However, reports that [name] [name] has not been able to receive his monthly Invega shots that was due on 01-14-23 because he was incarcerated.  The Act team reports that [name] [name]'s current medications include; Zyprexa 20 mg Q hs, Lithium carbonate 300 mg bid & the Invega 456 mg that he never got. [name] [name] was apparently on two separate antipsychotic medications for his mental health issues.     During the [name] [name]'s hospitalization, [name] [name] had extensive initial psychiatric evaluation, and follow-up psychiatric evaluations every day.   Psychiatric diagnoses provided upon initial assessment: Schizoaffective disorder bipolar type, stimulant use  disorder/methamphetamine   [name] [name]'s psychiatric medications were adjusted on admission: -Continue Zyprexa 5 mg po q daily for mood instability.  -Initiated Lithium carbonate 300 mg po bid for mood stabilization. -Initiated Zyprexa 15 mg po Q hs for psychosis.  -Continue hydroxyzine 25 mg po tid prn for anxiety.  -Continue Trazodone 50 mg po Q hs prn for insomnia,   During the hospitalization, other adjustments were made to the [name] [name]'s psychiatric medication regimen: Lithium was discontinued for lack of efficacy, Zyprexa was titrated to 20 mg at bedtime and 10 mg in the morning then lower down later on during hospital stay to 20 mg only at bedtime secondary to drowsiness side effect from morning dose.  Trazodone was given scheduled for sleep and titrated up to 300 mg at bedtime, Depakote was started and titrated up to 1000 mg twice daily for mood stabilization and to augment antipsychotic effect, Restoril was added 15 mg at bedtime scheduled to help with sleep.  Secondary to refractory psychosis Haldol was added  and titrated up gradually to 10 mg 3 times daily for psychosis and mood stabilization, Cogentin was added 1 mg twice daily for EPS prophylaxis given on 2 antipsychotics.  Later during hospital stay and to help with long-term compliance and decrease risk of decompensation [name] [name] was started on Haldol LAI, he was given 100 mg IM on 10/1 and then 200 mg IM on 10/3 with plan to have him receive 300 mg IM monthly starting 11/4.   [name] [name]'s care was discussed during the interdisciplinary team meeting every day during the hospitalization.   Please note during first 1 to 2 weeks of [name] [name]'s hospitalization he was constantly agitated requiring as needed agitation protocol once to twice daily he was also noted to be hypersexual, sexually preoccupied and inappropriate with male staff, he started showing significant improvement in the last 10 days of his hospitalization with decreased need for as  needed agitation protocol with last agitation protocol used on 9/20.  He was noted in the last 10 days of his hospitalization to be cooperative and calm with no agitation or aggression episodes no hypersexuality or sexually inappropriateness noted, he was able to identify when he is on importance to comply with medications and follow-up appointment after discharge and to abstain from illicit drug use in order to avoid decompensation and rehospitalization.  At time of discharge he was in agreement to comply with medications including Haldol LAI monthly.  ACT team staff visited with the [name] [name] on day prior to discharge and they agreed that he is significantly improved and is suitable for outpatient care.  ACT team staff communicated with [name] [name]'s mother prior to discharge and she agreed with plan of discharge with ACT team follow-up closely after discharge to avoid decompensation.  This provider attempted to contact [name] [name]'s mother in the last 2 to 3 days prior to discharge with no response.   The [name] [name] denied having side effects to prescribed psychiatric medication except for some morning drowsiness which led to discontinuing morning dose of Zyprexa, improved afterward.   Gradually, [name] [name] started adjusting to milieu. The [name] [name] was evaluated each day by a clinical provider to ascertain response to treatment. Improvement was noted by the [name] [name]'s report of decreasing symptoms, improved sleep and appetite, affect, medication tolerance, behavior, and participation in unit programming.  [name] [name] was asked each day to complete a self inventory noting mood, mental status, pain, new symptoms, anxiety and concerns.     Symptoms were reported as significantly decreased or resolved completely by discharge.    On day of discharge, [name] [name] was evaluated on 10//24 the [name] [name] reports that their mood is stable.  [name] [name] continues to deny SI HI or AVH, presents linear organized with no paranoia or other delusions no  sexual preoccupation aggression or disorganized thought noted.  He displays improved insight and judgment identifies he has a mental illness requiring compliance with medications and follow-up appointment and to abstain from illicit drug after discharge to decrease risk of decompensation and hospitalization.  He presented with linear coherent speech for the most part, fair eye contact without any aggression or agitation to be noted.  He did not presented drowsy or sleepy and did not display any sign consistent with EPS or TD.     The [name] [name] denied having suicidal thoughts for more than 48 hours prior to discharge.  [name] [name] denies having homicidal thoughts.  [name] [name] denies having auditory hallucinations.  [name] [name] denies any visual hallucinations or other symptoms of psychosis. The [name] [name] was motivated to continue taking medication with  a goal of continued improvement in mental health.    The [name] [name] reports their target psychiatric symptoms of agitation, disorganized thought process and paranoia responded well to the psychiatric medications, and the [name] [name] reports overall benefit other psychiatric hospitalization. Supportive psychotherapy was provided to the [name] [name]. The [name] [name] also participated in regular group therapy while hospitalized. Coping skills, problem solving as well as relaxation therapies were also part of the unit programming.   Labs were reviewed with the [name] [name], and abnormal results were discussed with the [name] [name].   The [name] [name] is able to verbalize their individual safety plan to this provider.   Behavioral Events: Incidents of agitation during first 2 weeks of hospitalization but subsided gradually then subsided completely without any aggression episodes in the last 10 days of hospital stay.   Restraints: None   Groups: Improved attendance and participation in the last 10 days of hospital stay   Medications Changes: As above   Sleep  Good, improved during hospital stay     Physical Findings: AIMS: Facial and Oral Movements Muscles of Facial Expression: None, normal Lips and Perioral Area: None, normal Jaw: None, normal Tongue: None, normal,Extremity Movements Upper (arms, wrists, hands, fingers): None, normal Lower (legs, knees, ankles, toes): None, normal, Trunk Movements Neck, shoulders, hips: None, normal, Overall Severity Severity of abnormal movements (highest score from questions above): None, normal Incapacitation due to abnormal movements: None, normal [name] [name]'s awareness of abnormal movements (rate only [name] [name]'s report): No Awareness, Dental Status Current problems with teeth and/or dentures?: No Does [name] [name] usually wear dentures?: No  CIWA:    COWS:     Musculoskeletal: Strength & Muscle Tone: within normal limits Gait & Station: normal [name] [name] leans: N/A   Psychiatric Specialty Exam:  General Appearance: appears at stated age, fairly dressed and groomed  Behavior: pleasant and cooperative  Psychomotor Activity:No psychomotor agitation or retardation noted   Eye Contact: good Speech: normal amount, tone, volume and latency   Mood: euthymic Affect: congruent, pleasant and interactive  Thought Process: linear, goal directed, no circumstantial or tangential thought process noted, no racing thoughts or flight of ideas Descriptions of Associations: intact Thought Content: Hallucinations: denies AH, VH , does not appear responding to stimuli Delusions: No paranoia or other delusions noted Suicidal Thoughts: denies SI, intention, plan  Homicidal Thoughts: denies HI, intention, plan   Alertness/Orientation: alert and fully oriented  Insight: fair, improved Judgment: fair, improved  Memory: intact  Executive Functions  Concentration: intact  Attention Span: Fair Recall: intact Fund of Knowledge: fair   Assets  Assets: Desire for Improvement; Resilience    Physical Exam:  Physical Exam Vitals and nursing note  reviewed.  Constitutional:      Appearance: Normal appearance.  HENT:     Head: Normocephalic and atraumatic.     Nose: Nose normal.  Eyes:     Extraocular Movements: Extraocular movements intact.     Pupils: Pupils are equal, round, and reactive to light.  Pulmonary:     Effort: Pulmonary effort is normal.  Musculoskeletal:        General: Normal range of motion.     Cervical back: Normal range of motion.  Neurological:     General: No focal deficit present.     Mental Status: He is alert and oriented to person, place, and time. Mental status is at baseline.  Psychiatric:        Mood and Affect: Mood normal.        Behavior: Behavior normal.  Thought Content: Thought content normal.    Review of Systems  All other systems reviewed and are negative.  Blood pressure (!) 147/67, pulse 88, temperature (!) 97.5 F (36.4 C), temperature source Oral, resp. rate 18, height 5\' 3"  (1.6 m), weight 70 kg, SpO2 98%. Body mass index is 27.35 kg/m.   Social History   Tobacco Use  Smoking Status Every Day   Current packs/day: 1.00   Types: Cigarettes  Smokeless Tobacco Former   Tobacco Cessation:  N/A, [name] [name] does not currently use tobacco products   Blood Alcohol level:  Lab Results  Component Value Date   ETH <10 03/09/2023   ETH <10 10/05/2022    Metabolic Disorder Labs:  Lab Results  Component Value Date   HGBA1C 5.7 (H) 10/07/2022   MPG 117 10/07/2022   MPG 114 10/05/2022   Lab Results  Component Value Date   PROLACTIN 17.4 10/05/2022   PROLACTIN 45.8 (H) 12/19/2017   Lab Results  Component Value Date   CHOL 156 10/05/2022   TRIG 116 10/05/2022   HDL 40 (L) 10/05/2022   CHOLHDL 3.9 10/05/2022   VLDL 23 10/05/2022   LDLCALC 93 10/05/2022   LDLCALC 69 11/21/2020    See Psychiatric Specialty Exam and Suicide Risk Assessment completed by Attending Physician prior to discharge.  Discharge destination:  Home with family  Is [name] [name] on multiple  antipsychotic therapies at discharge:  No   Has [name] [name] had three or more failed trials of antipsychotic monotherapy by history:  Yes,   Antipsychotic medications that previously failed include:   3.  Haldol, Zyprexa, risperidone.  Recommended Plan for Multiple Antipsychotic Therapies: Additional reason(s) for multiple antispychotic treatment:  Refractory psychosis and mood instability requiring 2 antipsychotics for treatment, recommending tapering off oral Haldol after 3 to 4 weeks of discharge, recommending long-term antipsychotic regimen to include Zyprexa p.o. and Haldol LAI secondary to refractoriness of his symptoms and lack of response on 1 antipsychotic  Discharge Instructions     Diet - low sodium heart healthy   Complete by: As directed    Increase activity slowly   Complete by: As directed       Allergies as of 04/09/2023   No Known Allergies      Medication List     STOP taking these medications    Invega Trinza 546 MG/1.75ML injection Generic drug: Paliperidone Palmitate ER   lithium 300 MG tablet       TAKE these medications      Indication  benztropine 1 MG tablet Commonly known as: COGENTIN Take 1 tablet (1 mg total) by mouth 2 (two) times daily.  Indication: Extrapyramidal Reaction caused by Medications   divalproex 500 MG DR tablet Commonly known as: DEPAKOTE Take 2 tablets (1,000 mg total) by mouth 2 (two) times daily.  Indication: Schizophrenia   gabapentin 300 MG capsule Commonly known as: NEURONTIN Take 1 capsule (300 mg total) by mouth 3 (three) times daily.  Indication: Generalized Anxiety Disorder, Agitation.   haloperidol 10 MG tablet Commonly known as: HALDOL Take 1 tablet (10 mg total) by mouth 3 (three) times daily.  Indication: Psychosis   haloperidol decanoate 100 MG/ML injection Commonly known as: HALDOL DECANOATE Inject 3 mLs (300 mg total) into the muscle every 30 (thirty) days. Start taking on: May 10, 2023  Indication:  Schizophrenia   hydrOXYzine 50 MG tablet Commonly known as: ATARAX Take 1 tablet (50 mg total) by mouth every 4 (four) hours as needed for anxiety.  Indication: Feeling Anxious   nicotine 14 mg/24hr patch Commonly known as: NICODERM CQ - dosed in mg/24 hours Place 1 patch (14 mg total) onto the skin daily.  Indication: Nicotine Addiction   OLANZapine 20 MG tablet Commonly known as: ZYPREXA Take 1 tablet (20 mg total) by mouth at bedtime. For mood control What changed: Another medication with the same name was removed. Continue taking this medication, and follow the directions you see here.  Indication: Schizophrenia   temazepam 15 MG capsule Commonly known as: RESTORIL Take 1 capsule (15 mg total) by mouth at bedtime.  Indication: Trouble Sleeping   trazodone 300 MG tablet Commonly known as: DESYREL Take 1 tablet (300 mg total) by mouth at bedtime.  Indication: Trouble Sleeping        Follow-up Information     [facility]. Go to.   Specialty: Behavioral Health Why: Please go to this provider for an assessment, for therapy and medication management services.  For fastest service, please go on Monday through Friday, arrive by 7:00 am for same day services. Contact information: [address] [place] [address] [phone]        [facility] Follow up.   Specialty: Behavioral Health Why: Referral made Contact information: [address] [place] [address] [phone]                Discharge recommendations:    Activity: as tolerated  Diet: heart healthy  # It is recommended to the [name] [name] to continue psychiatric medications as prescribed, after discharge from the hospital.     # It is recommended to the [name] [name] to follow up with your outpatient psychiatric provider and PCP.   # It was discussed with the [name] [name], the impact of alcohol, drugs, tobacco have been there overall  psychiatric and medical wellbeing, and total abstinence from substance use was recommended the [name] [name].ed.   # Prescriptions provided or sent directly to preferred pharmacy at discharge. [name] [name] agreeable to plan. Given opportunity to ask questions. Appears to feel comfortable with discharge.    # In the event of worsening symptoms, the [name] [name] is instructed to call the crisis hotline, 911 and or go to the nearest ED for appropriate evaluation and treatment of symptoms. To follow-up with primary care provider for other medical issues, concerns and or health care needs   # [name] [name] was discharged home with a plan to follow up as noted above.   [name] [name] agrees with D/C instructions and plan.   The [name] [name] received suicide prevention pamphlet:  Yes Belongings returned:  Clothing and Valuables  Total Time Spent in Direct [name] [name] Care:  I personally spent 45 minutes on the unit in direct [name] [name] care. The direct [name] [name] care time included face-to-face time with the [name] [name], reviewing the [name] [name]'s chart, communicating with other professionals, and coordinating care. Greater than 50% of this time was spent in counseling or coordinating care with the [name] [name] regarding goals of hospitalization, psycho-education, and discharge planning needs.    SignedSarita Bottom, MD 04/09/2023, 10:07 AM

## 2023-04-09 NOTE — Progress Notes (Signed)
  [facility] Adult Case Management Discharge Plan :  Will you be returning to the same living situation after discharge:  Yes,  Pt will be returning to his residence and will receive ACT team services from [facility]. At discharge, do you have transportation home?: Yes,  [phone].  Do you have the ability to pay for your medications: Yes,  Insured  Release of information consent forms completed and in the chart;  Patient's signature needed at discharge.  Patient to Follow up at:  Follow-up Information     [facility]. Go to.   Specialty: Behavioral Health Why: Please go to this provider for an assessment, for therapy and medication management services.  For fastest service, please go on Monday through Friday, arrive by 7:00 am for same day services. Contact information: [address] [place] [address] [phone]        [facility] Follow up.   Specialty: Behavioral Health Why: Referral made Contact information: [address] [place] [address] [phone]                Next level of care provider has access to [facility] Link:yes  Safety Planning and Suicide Prevention discussed: No.Declined Consents     Has patient been referred to the Quitline?: Patient refused referral for treatment  Patient has been referred for addiction treatment: Yes, the patient will follow up with an outpatient provider for substance use disorder. Psychiatrist/APP: patient to schedule appointment and Therapist: patient to schedule appointment. Follow-up will be provided by [facility]   Ronnell Freshwater Angell Pincock, LCSW 04/09/2023, 9:23 AM

## 2023-04-09 NOTE — Progress Notes (Signed)
Recreation Therapy Notes  INPATIENT RECREATION TR PLAN  Patient Details Name: [name] [name] MRN: [id number] DOB: 07-02-1981 Today's Date: 04/09/2023  Rec Therapy Plan Is patient appropriate for Therapeutic Recreation?: Yes Treatment times per week: about 3 days Estimated Length of Stay: 5-7 days TR Treatment/Interventions: Group participation (Comment)  Discharge Criteria Pt will be discharged from therapy if:: Discharged Treatment plan/goals/alternatives discussed and agreed upon by:: Patient/family  Discharge Summary Short term goals set: See patient care plan Short term goals met: Not met Progress toward goals comments: Groups attended Which groups?: Self-esteem, Wellness, Stress management, Coping skills, Other (Comment) (Relaxation, Decision Making, Personal Development, Team Building) Reason goals not met: Pt was unable to focus during group sessions. Therapeutic equipment acquired: N/A Reason patient discharged from therapy: Discharge from hospital Pt/family agrees with progress & goals achieved: Yes Date patient discharged from therapy: 04/09/23   Daneya Hartgrove-McCall, LRT,CTRS Askia Hazelip A Branson Kranz-McCall 04/09/2023, 11:19 AM

## 2023-04-09 NOTE — Progress Notes (Signed)
Pt discharged to lobby. Pt was stable and appreciative at that time. All papers and prescriptions were given and valuables returned. Verbal understanding expressed. Denies SI/HI and A/VH. Pt given opportunity to express concerns and ask questions.  

## 2023-04-27 ENCOUNTER — Emergency Department (HOSPITAL_COMMUNITY): Payer: Self-pay

## 2023-04-27 ENCOUNTER — Encounter (HOSPITAL_COMMUNITY): Payer: Self-pay

## 2023-04-27 ENCOUNTER — Emergency Department (HOSPITAL_COMMUNITY)
Admission: EM | Admit: 2023-04-27 | Discharge: 2023-04-27 | Discharge status: Against Medical Advice | Payer: Self-pay | Attending: Emergency Medicine | Admitting: Emergency Medicine

## 2023-04-27 DIAGNOSIS — F17210 Nicotine dependence, cigarettes, uncomplicated: Secondary | ICD-10-CM | POA: Insufficient documentation

## 2023-04-27 DIAGNOSIS — M25522 Pain in left elbow: Secondary | ICD-10-CM | POA: Insufficient documentation

## 2023-04-27 DIAGNOSIS — F1090 Alcohol use, unspecified, uncomplicated: Secondary | ICD-10-CM | POA: Insufficient documentation

## 2023-04-27 DIAGNOSIS — Z5900 Homelessness unspecified: Secondary | ICD-10-CM | POA: Insufficient documentation

## 2023-04-27 DIAGNOSIS — Z5321 Procedure and treatment not carried out due to patient leaving prior to being seen by health care provider: Secondary | ICD-10-CM | POA: Insufficient documentation

## 2023-04-27 DIAGNOSIS — M25562 Pain in left knee: Secondary | ICD-10-CM | POA: Insufficient documentation

## 2023-04-27 HISTORY — DX: Homelessness unspecified: Z59.00

## 2023-04-27 HISTORY — DX: Respiratory tuberculosis unspecified: A15.9
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
## 2023-04-27 NOTE — ED Notes (Signed)
Unable to find pt in waiting room. 

## 2023-07-29 ENCOUNTER — Ambulatory Visit (HOSPITAL_COMMUNITY)
Admission: EM | Admit: 2023-07-29 | Discharge: 2023-07-29 | Disposition: A | Discharge status: Home / Self Care | Payer: No Payment, Other | Attending: Psychiatry | Admitting: Psychiatry

## 2023-07-29 DIAGNOSIS — Z5902 Unsheltered homelessness: Secondary | ICD-10-CM | POA: Insufficient documentation

## 2023-07-29 DIAGNOSIS — Z765 Malingerer [conscious simulation]: Secondary | ICD-10-CM | POA: Insufficient documentation

## 2023-07-29 DIAGNOSIS — Z5900 Homelessness unspecified: Secondary | ICD-10-CM

## 2023-07-29 DIAGNOSIS — F209 Schizophrenia, unspecified: Secondary | ICD-10-CM | POA: Insufficient documentation
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
## 2023-07-29 NOTE — ED Notes (Signed)
Patient A&O x 4, ambulatory. Patient discharged in no acute distress. Patient denied SI/HI, A/VH upon discharge. Patient verbalized understanding of all discharge instructions explained by staff, to include follow up appointments, RX's and safety plan. Patient reported mood 10/10.  Pt belongings returned to patient from locker ntact. Patient escorted to lobby via staff for transport to destination. Safety maintained.

## 2023-07-29 NOTE — Discharge Instructions (Signed)
F/u with men shelter 
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
## 2023-08-04 ENCOUNTER — Other Ambulatory Visit: Payer: Self-pay

## 2023-08-04 ENCOUNTER — Emergency Department (HOSPITAL_COMMUNITY)
Admission: EM | Admit: 2023-08-04 | Discharge: 2023-08-06 | Disposition: A | Discharge status: Other Acute Inpatient Hospital | Payer: Medicare Other | Attending: Emergency Medicine | Admitting: Emergency Medicine

## 2023-08-04 DIAGNOSIS — Z91148 Patient's other noncompliance with medication regimen for other reason: Secondary | ICD-10-CM | POA: Diagnosis not present

## 2023-08-04 DIAGNOSIS — Z79899 Other long term (current) drug therapy: Secondary | ICD-10-CM | POA: Insufficient documentation

## 2023-08-04 DIAGNOSIS — F17210 Nicotine dependence, cigarettes, uncomplicated: Secondary | ICD-10-CM | POA: Diagnosis not present

## 2023-08-04 DIAGNOSIS — F32A Depression, unspecified: Secondary | ICD-10-CM | POA: Diagnosis not present

## 2023-08-04 DIAGNOSIS — F419 Anxiety disorder, unspecified: Secondary | ICD-10-CM | POA: Diagnosis not present

## 2023-08-04 DIAGNOSIS — Z5900 Homelessness unspecified: Secondary | ICD-10-CM | POA: Diagnosis not present

## 2023-08-04 DIAGNOSIS — R4689 Other symptoms and signs involving appearance and behavior: Secondary | ICD-10-CM | POA: Diagnosis present

## 2023-08-04 DIAGNOSIS — F250 Schizoaffective disorder, bipolar type: Secondary | ICD-10-CM | POA: Insufficient documentation

## 2023-08-04 DIAGNOSIS — R059 Cough, unspecified: Secondary | ICD-10-CM | POA: Insufficient documentation

## 2023-08-04 DIAGNOSIS — F19959 Other psychoactive substance use, unspecified with psychoactive substance-induced psychotic disorder, unspecified: Secondary | ICD-10-CM | POA: Diagnosis present

## 2023-08-04 DIAGNOSIS — Z20822 Contact with and (suspected) exposure to covid-19: Secondary | ICD-10-CM | POA: Insufficient documentation

## 2023-08-04 DIAGNOSIS — F29 Unspecified psychosis not due to a substance or known physiological condition: Secondary | ICD-10-CM | POA: Diagnosis not present

## 2023-08-04 LAB — COMPREHENSIVE METABOLIC PANEL
ALT: 42 U/L (ref 0–44)
AST: 31 U/L (ref 15–41)
Albumin: 4.1 g/dL (ref 3.5–5.0)
Alkaline Phosphatase: 60 U/L (ref 38–126)
Anion gap: 10 (ref 5–15)
BUN: 12 mg/dL (ref 6–20)
CO2: 22 mmol/L (ref 22–32)
Calcium: 9.4 mg/dL (ref 8.9–10.3)
Chloride: 105 mmol/L (ref 98–111)
Creatinine, Ser: 0.76 mg/dL (ref 0.61–1.24)
GFR, Estimated: >60 mL/min (ref >60)
Glucose, Bld: 79 mg/dL (ref 70–99)
Potassium: 3.6 mmol/L (ref 3.5–5.1)
Sodium: 137 mmol/L (ref 135–145)
Total Bilirubin: 0.5 mg/dL (ref 0.0–1.2)
Total Protein: 7.3 g/dL (ref 6.5–8.1)

## 2023-08-04 LAB — CBC WITH DIFFERENTIAL/PLATELET
Abs Immature Granulocytes: 0.01 10*3/uL (ref 0.00–0.07)
Basophils Absolute: 0 10*3/uL (ref 0.0–0.1)
Basophils Relative: 0 %
Eosinophils Absolute: 0 10*3/uL (ref 0.0–0.5)
Eosinophils Relative: 0 %
HCT: 41.1 % (ref 39.0–52.0)
Hemoglobin: 13 g/dL (ref 13.0–17.0)
Immature Granulocytes: 0 %
Lymphocytes Relative: 16 %
Lymphs Abs: 1.1 10*3/uL (ref 0.7–4.0)
MCH: 26.5 pg (ref 26.0–34.0)
MCHC: 31.6 g/dL (ref 30.0–36.0)
MCV: 83.9 fL (ref 80.0–100.0)
Monocytes Absolute: 0.5 10*3/uL (ref 0.1–1.0)
Monocytes Relative: 8 %
Neutro Abs: 5.2 10*3/uL (ref 1.7–7.7)
Neutrophils Relative %: 76 %
Platelets: 273 10*3/uL (ref 150–400)
RBC: 4.9 MIL/uL (ref 4.22–5.81)
RDW: 13.3 % (ref 11.5–15.5)
WBC: 6.8 10*3/uL (ref 4.0–10.5)
nRBC: 0 % (ref 0.0–0.2)

## 2023-08-04 LAB — RAPID URINE DRUG SCREEN, HOSP PERFORMED
Amphetamines: POSITIVE — AB
Barbiturates: NOT DETECTED
Benzodiazepines: NOT DETECTED
Cocaine: NOT DETECTED
Opiates: NOT DETECTED
Tetrahydrocannabinol: NOT DETECTED

## 2023-08-04 LAB — ETHANOL: Alcohol, Ethyl (B): <10 mg/dL (ref <10)

## 2023-08-04 MED ORDER — ZIPRASIDONE MESYLATE 20 MG IM SOLR: 10.0000 mg | Freq: Four times a day (QID) | INTRAMUSCULAR | Status: DC | PRN

## 2023-08-04 MED ORDER — TRAZODONE HCL 100 MG PO TABS
300.0000 mg | ORAL_TABLET | Freq: Every day | ORAL | Status: DC
  Administered 2023-08-04 – 2023-08-05 (×2): 300 mg via ORAL
  Filled 2023-08-04 (×2): qty 3

## 2023-08-04 MED ORDER — GABAPENTIN 300 MG PO CAPS
300.0000 mg | ORAL_CAPSULE | Freq: Three times a day (TID) | ORAL | Status: DC
  Administered 2023-08-04 – 2023-08-06 (×5): 300 mg via ORAL
  Filled 2023-08-04 (×5): qty 1

## 2023-08-04 MED ORDER — BENZTROPINE MESYLATE 0.5 MG PO TABS
1.0000 mg | ORAL_TABLET | Freq: Two times a day (BID) | ORAL | Status: DC
  Administered 2023-08-04 – 2023-08-06 (×4): 1 mg via ORAL
  Filled 2023-08-04 (×4): qty 2

## 2023-08-04 MED ORDER — LORAZEPAM 2 MG/ML IJ SOLN: 2.0000 mg | Freq: Four times a day (QID) | INTRAMUSCULAR | Status: DC | PRN

## 2023-08-04 MED ORDER — DIVALPROEX SODIUM 500 MG PO DR TAB
1000.0000 mg | DELAYED_RELEASE_TABLET | Freq: Two times a day (BID) | ORAL | Status: DC
  Administered 2023-08-04 – 2023-08-06 (×4): 1000 mg via ORAL
  Filled 2023-08-04 (×4): qty 2

## 2023-08-04 MED ORDER — HYDROXYZINE HCL 25 MG PO TABS
50.0000 mg | ORAL_TABLET | ORAL | Status: DC | PRN
  Administered 2023-08-04: 50 mg via ORAL
  Filled 2023-08-04: qty 2

## 2023-08-04 MED ORDER — ZIPRASIDONE MESYLATE 20 MG IM SOLR
20.0000 mg | Freq: Once | INTRAMUSCULAR | Status: AC
  Administered 2023-08-04: 20 mg via INTRAMUSCULAR
  Filled 2023-08-04: qty 20

## 2023-08-04 MED ORDER — OLANZAPINE 10 MG PO TABS
20.0000 mg | ORAL_TABLET | Freq: Every day | ORAL | Status: DC
  Administered 2023-08-04 – 2023-08-05 (×2): 20 mg via ORAL
  Filled 2023-08-04 (×2): qty 2

## 2023-08-04 MED ORDER — STERILE WATER FOR INJECTION IJ SOLN
INTRAMUSCULAR | Status: AC
  Filled 2023-08-04: qty 10

## 2023-08-04 MED ORDER — HALOPERIDOL 5 MG PO TABS
10.0000 mg | ORAL_TABLET | Freq: Three times a day (TID) | ORAL | Status: DC
  Administered 2023-08-04 – 2023-08-06 (×5): 10 mg via ORAL
  Filled 2023-08-04 (×5): qty 2

## 2023-08-04 MED ORDER — TEMAZEPAM 15 MG PO CAPS
15.0000 mg | ORAL_CAPSULE | Freq: Every day | ORAL | Status: DC
  Administered 2023-08-04 – 2023-08-05 (×2): 15 mg via ORAL
  Filled 2023-08-04 (×2): qty 1

## 2023-08-04 NOTE — Consult Note (Signed)
Attempted to assess patient.  Patient received geodon injection and is not able to answer questions at this time.  Will defer assessment until patient can participate.

## 2023-08-04 NOTE — ED Notes (Signed)
Pt changed in to scrubs, one belongings bag placed in cabinet labeled 5-8, wanded by security
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
## 2023-08-05 DIAGNOSIS — F250 Schizoaffective disorder, bipolar type: Secondary | ICD-10-CM

## 2023-08-05 LAB — SARS CORONAVIRUS 2 BY RT PCR: SARS Coronavirus 2 by RT PCR: NEGATIVE

## 2023-08-05 MED ORDER — ACETAMINOPHEN 500 MG PO TABS
1000.0000 mg | ORAL_TABLET | Freq: Once | ORAL | Status: AC
  Administered 2023-08-05: 1000 mg via ORAL
  Filled 2023-08-05: qty 2

## 2023-08-05 MED ORDER — NICOTINE POLACRILEX 2 MG MT GUM
2.0000 mg | CHEWING_GUM | OROMUCOSAL | Status: DC
  Administered 2023-08-05 (×2): 2 mg via ORAL
  Filled 2023-08-05 (×4): qty 1

## 2023-08-05 NOTE — BH Assessment (Signed)
TTS attempted to assess pt. Per Morrell Riddle, RN, pt is currently asleep. TTS will be notified when pt is awake and alert for assessment.

## 2023-08-05 NOTE — ED Notes (Signed)
Patient has been alert this shift. Patient having manic behaviors. Restless at times. Medication compliant. Patient disorganized. Patient is grandiose and makes delusional statements. Intrusive at times.  Redirectable at this time.

## 2023-08-05 NOTE — ED Provider Notes (Signed)
Attempted to see this patient this morning, patient tried to wake himself up, to speak with this provider, but had a hard time, as he kept dozing off and falling to the side. Patient was incoherent, as he was mumbling while trying to speak and becoming increasingly agitated when provider asked him to repeat himself. Discussed with nursing staff to let this provider knows when he wakes for lunch and will attempt to see his again.

## 2023-08-05 NOTE — ED Notes (Signed)
Pt was given a lunch tray. Pt ate 100%
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
## 2023-08-05 NOTE — Progress Notes (Addendum)
Pt was accepted to [facility] TOMORROW 08/06/2023 ; Bed Assignment Unit 600  Pt meets inpatient criteria per Alona Bene, PMHNP  Attending Physician will be Dr. Teena Irani. [place]ac[place],MD  Report can be called to: - [phone]  Pt can arrive after: 8:00am  Care Team notified: Rennie Natter   [place], LCSWA 08/05/2023 @ 9:09 PM

## 2023-08-05 NOTE — ED Notes (Signed)
[name] [name] is to take patient tomorrow. Spoke with nurse coordinator at the facility.
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
## 2023-08-05 NOTE — ED Provider Notes (Signed)
Emergency Medicine Observation Re-evaluation Note  [name] [name] is a 43 y.o. male, seen on rounds today.  Pt initially presented to the ED for complaints of IVC Currently, the patient is sleeping.  Physical Exam  BP (!) 102/54   Pulse 92   Temp 98.7 F (37.1 C) (Oral)   Resp 20   SpO2 98%  Physical Exam General: NAD Lungs: bilateral chest rise, no tachypnea Psych: resting comfortably  ED Course / MDM  EKG:EKG Interpretation Date/Time:  Wednesday August 04 2023 14:49:07 EST Ventricular Rate:  93 PR Interval:  120 QRS Duration:  88 QT Interval:  366 QTC Calculation: 455 R Axis:   103  Text Interpretation: Normal sinus rhythm Rightward axis Biventricular hypertrophy Confirmed by Gloris Manchester [phone]) on 08/04/2023 5:27:59 PM  I have reviewed the labs performed to date as well as medications administered while in observation.  Recent changes in the last 24 hours include patient came in with acute psychosis under IVC awaiting TTS evaluation.  Plan  Current plan is for TTS evaluation.    Melene Plan, DO 08/05/23 [phone]

## 2023-08-06 NOTE — ED Notes (Signed)
Report called to BRAD at [facility]

## 2023-08-06 NOTE — ED Notes (Signed)
 Called for transport

## 2023-08-06 NOTE — ED Notes (Signed)
Called to give report but they are in shift change and they will call back in about 10 min

## 2023-08-06 NOTE — ED Provider Notes (Signed)
Emergency Medicine Observation Re-evaluation Note  [name] [name] is a 43 y.o. male, seen on rounds today.  Pt initially presented to the ED for complaints of IVC Currently, the patient is resting.  Physical Exam  BP 138/82 (BP Location: Right Arm)   Pulse 98   Temp 99.7 F (37.6 C) (Oral)   Resp 18   SpO2 96%  Physical Exam General: No acute distress Cardiac: Regular rate Lungs: Normal effort Psych:   ED Course / MDM  EKG:EKG Interpretation Date/Time:  Wednesday August 04 2023 14:49:07 EST Ventricular Rate:  93 PR Interval:  120 QRS Duration:  88 QT Interval:  366 QTC Calculation: 455 R Axis:   103  Text Interpretation: Normal sinus rhythm Rightward axis Biventricular hypertrophy Confirmed by Gloris Manchester [phone]) on 08/04/2023 5:27:59 PM  I have reviewed the labs performed to date as well as medications administered while in observation.  Recent changes in the last 24 hours include Continued episodes of grandiose and delusional statements.  Patient has been cooperative.  Plan  Current plan is for inpatient psychiatric treatment.    Linwood Dibbles, MD 08/06/23 [phone]

## 2023-08-06 NOTE — ED Notes (Signed)
Pt walk to bathroom around 330Am, pt slept whole night

## 2023-08-22 ENCOUNTER — Emergency Department (HOSPITAL_COMMUNITY)
Admission: EM | Admit: 2023-08-22 | Discharge: 2023-08-22 | Discharge status: Against Medical Advice | Payer: Medicare Other | Attending: Emergency Medicine | Admitting: Emergency Medicine

## 2023-08-22 ENCOUNTER — Other Ambulatory Visit: Payer: Self-pay

## 2023-08-22 ENCOUNTER — Encounter (HOSPITAL_COMMUNITY): Payer: Self-pay | Admitting: *Deleted

## 2023-08-22 DIAGNOSIS — R531 Weakness: Secondary | ICD-10-CM | POA: Diagnosis not present

## 2023-08-22 DIAGNOSIS — R112 Nausea with vomiting, unspecified: Secondary | ICD-10-CM | POA: Insufficient documentation

## 2023-08-22 DIAGNOSIS — Z5321 Procedure and treatment not carried out due to patient leaving prior to being seen by health care provider: Secondary | ICD-10-CM | POA: Diagnosis not present

## 2023-08-22 DIAGNOSIS — M791 Myalgia, unspecified site: Secondary | ICD-10-CM | POA: Insufficient documentation

## 2023-08-22 DIAGNOSIS — R197 Diarrhea, unspecified: Secondary | ICD-10-CM | POA: Insufficient documentation

## 2023-08-22 DIAGNOSIS — R059 Cough, unspecified: Secondary | ICD-10-CM | POA: Diagnosis not present

## 2023-08-22 LAB — URINALYSIS, ROUTINE W REFLEX MICROSCOPIC
Bilirubin Urine: NEGATIVE
Glucose, UA: NEGATIVE mg/dL
Hgb urine dipstick: NEGATIVE
Ketones, ur: NEGATIVE mg/dL
Leukocytes,Ua: NEGATIVE
Nitrite: NEGATIVE
Protein, ur: NEGATIVE mg/dL
Specific Gravity, Urine: 1.016 (ref 1.005–1.030)
pH: 6 (ref 5.0–8.0)

## 2023-08-22 LAB — RESP PANEL BY RT-PCR (RSV, FLU A&B, COVID)  RVPGX2
Influenza A by PCR: NEGATIVE
Influenza B by PCR: NEGATIVE
Resp Syncytial Virus by PCR: NEGATIVE
SARS Coronavirus 2 by RT PCR: NEGATIVE

## 2023-08-22 NOTE — ED Notes (Signed)
 Pt refused blood work, due to the Scientist, research (life sciences) wasn't able to give pt food.
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
## 2023-08-22 NOTE — ED Notes (Addendum)
 Refused blood draw. Preoccupied with something to eat. Alert, NAD, calm, interactive. Steady gait.

## 2023-09-01 ENCOUNTER — Encounter (HOSPITAL_COMMUNITY): Payer: Self-pay

## 2023-09-01 ENCOUNTER — Other Ambulatory Visit: Payer: Self-pay

## 2023-09-01 ENCOUNTER — Emergency Department (HOSPITAL_COMMUNITY): Payer: Medicare Other

## 2023-09-01 ENCOUNTER — Emergency Department (HOSPITAL_COMMUNITY)
Admission: EM | Admit: 2023-09-01 | Discharge: 2023-09-01 | Disposition: A | Discharge status: Home / Self Care | Payer: Medicare Other | Attending: Emergency Medicine | Admitting: Emergency Medicine

## 2023-09-01 DIAGNOSIS — R0981 Nasal congestion: Secondary | ICD-10-CM | POA: Diagnosis present

## 2023-09-01 DIAGNOSIS — J069 Acute upper respiratory infection, unspecified: Secondary | ICD-10-CM | POA: Diagnosis not present

## 2023-09-01 DIAGNOSIS — M545 Low back pain, unspecified: Secondary | ICD-10-CM | POA: Insufficient documentation

## 2023-09-01 LAB — RESP PANEL BY RT-PCR (RSV, FLU A&B, COVID)  RVPGX2
Influenza A by PCR: NEGATIVE
Influenza B by PCR: NEGATIVE
Resp Syncytial Virus by PCR: NEGATIVE
SARS Coronavirus 2 by RT PCR: NEGATIVE

## 2023-09-01 MED ORDER — ACETAMINOPHEN 500 MG PO TABS
1000.0000 mg | ORAL_TABLET | Freq: Once | ORAL | Status: AC
  Administered 2023-09-01: 1000 mg via ORAL
  Filled 2023-09-01: qty 2

## 2023-09-01 MED ORDER — LIDOCAINE 5 % EX PTCH
1.0000 | MEDICATED_PATCH | Freq: Once | CUTANEOUS | Status: DC
  Administered 2023-09-01: 1 via TRANSDERMAL
  Filled 2023-09-01: qty 1
  Filled 2023-09-01: qty 3

## 2023-09-01 MED ORDER — IBUPROFEN 200 MG PO TABS
600.0000 mg | ORAL_TABLET | Freq: Once | ORAL | Status: AC
  Administered 2023-09-01: 600 mg via ORAL
  Filled 2023-09-01: qty 3

## 2023-09-01 NOTE — ED Provider Notes (Signed)
 [facility] EMERGENCY DEPARTMENT AT [facility] Provider Note   CSN: [id number] Arrival date & time: 09/01/23  1655     History  Chief Complaint  Patient presents with  . Back Pain  . Shortness of Breath  . Nasal Congestion  . Ingestion    [name] [name] is a 43 y.o. male.  Patient is a 43 year old male with a past medical history of schizophrenia and substance use presenting to the emergency department with congestion and back pain.  Patient states for the last few days he has had cough and congestion with some mild shortness of breath.  He denies any fevers, nausea, vomiting or diarrhea.  He states has had pain across his low back.  Denies any trauma or falls.  Denies any dysuria or hematuria or loss of bowel or bladder function.  He states that his legs feel weak but denies any numbness.  He denies any known sick contacts.  The history is provided by the patient.  Back Pain Shortness of Breath Ingestion Associated symptoms include shortness of breath.       Home Medications Prior to Admission medications   Medication Sig Start Date End Date Taking? Authorizing Provider  benztropine (COGENTIN) 1 MG tablet Take 1 tablet (1 mg total) by mouth 2 (two) times daily. Patient not taking: Reported on 08/05/2023 04/09/23   Sarita Bottom, MD  divalproex (DEPAKOTE) 500 MG DR tablet Take 2 tablets (1,000 mg total) by mouth 2 (two) times daily. Patient not taking: Reported on 08/05/2023 04/09/23 04/08/24  Sarita Bottom, MD  gabapentin (NEURONTIN) 300 MG capsule Take 1 capsule (300 mg total) by mouth 3 (three) times daily. Patient not taking: Reported on 08/05/2023 04/09/23   Sarita Bottom, MD  haloperidol (HALDOL) 10 MG tablet Take 1 tablet (10 mg total) by mouth 3 (three) times daily. Patient not taking: Reported on 08/05/2023 04/09/23 04/08/24  Sarita Bottom, MD  haloperidol decanoate (HALDOL DECANOATE) 100 MG/ML injection Inject 3 mLs (300 mg total) into the muscle every 30 (thirty)  days. Patient taking differently: Inject 300 mg into the muscle every 28 (twenty-eight) days. 05/10/23   Sarita Bottom, MD  hydrOXYzine (ATARAX) 50 MG tablet Take 1 tablet (50 mg total) by mouth every 4 (four) hours as needed for anxiety. Patient not taking: Reported on 08/05/2023 04/09/23   Sarita Bottom, MD  nicotine (NICODERM CQ - DOSED IN MG/24 HOURS) 14 mg/24hr patch Place 1 patch (14 mg total) onto the skin daily. Patient not taking: Reported on 08/05/2023 04/09/23   Sarita Bottom, MD  nicotine polacrilex (NICORETTE) 2 MG gum Take 2 mg by mouth as needed for smoking cessation (CHEW).    [provider]  OLANZapine (ZYPREXA) 20 MG tablet Take 1 tablet (20 mg total) by mouth at bedtime. For mood control Patient not taking: Reported on 08/05/2023 04/09/23   Sarita Bottom, MD  temazepam (RESTORIL) 15 MG capsule Take 1 capsule (15 mg total) by mouth at bedtime. Patient not taking: Reported on 08/05/2023 04/09/23   Sarita Bottom, MD  traZODone (DESYREL) 300 MG tablet Take 1 tablet (300 mg total) by mouth at bedtime. Patient not taking: Reported on 08/05/2023 04/09/23   Sarita Bottom, MD  TYLENOL 500 MG tablet Take 500-1,000 mg by mouth every 6 (six) hours as needed for mild pain (pain score 1-3) or headache.    [provider]      Allergies    Patient has no known allergies.    Review of Systems   Review  of Systems  Respiratory:  Positive for shortness of breath.   Musculoskeletal:  Positive for back pain.    Physical Exam Updated Vital Signs BP 118/68 (BP Location: Right Arm)   Pulse 80   Temp 98.6 F (37 C) (Oral)   Resp 18   SpO2 100%  Physical Exam Vitals and nursing note reviewed.  Constitutional:      General: He is not in acute distress.    Appearance: He is well-developed.  HENT:     Head: Normocephalic and atraumatic.     Mouth/Throat:     Mouth: Mucous membranes are moist.     Pharynx: Oropharynx is clear.  Eyes:     Extraocular Movements: Extraocular  movements intact.  Cardiovascular:     Rate and Rhythm: Normal rate and regular rhythm.  Pulmonary:     Effort: Pulmonary effort is normal.     Breath sounds: Normal breath sounds.  Abdominal:     Palpations: Abdomen is soft.     Tenderness: There is no abdominal tenderness.  Musculoskeletal:        General: Normal range of motion.     Cervical back: Normal range of motion and neck supple.     Comments: No midline back tenderness, mild lumbar paraspinal muscle tenderness bilaterally  Neurological:     General: No focal deficit present.     Mental Status: He is alert and oriented to person, place, and time.     Motor: No weakness.     Comments: 5 out of 5 strength in bilateral hip flexion, plantar/dorsi flexion, sensation intact in bilateral lower extremities  Psychiatric:        Mood and Affect: Mood normal.        Behavior: Behavior normal.     ED Results / Procedures / Treatments   Labs (all labs ordered are listed, but only abnormal results are displayed) Labs Reviewed  RESP PANEL BY RT-PCR (RSV, FLU A&B, COVID)  [id number]    EKG None  Radiology DG Chest Port 1 View Result Date: 09/01/2023 CLINICAL DATA:  Shortness of breath EXAM: PORTABLE CHEST 1 VIEW semi upright COMPARISON:  01/08/2019 FINDINGS: No consolidation, pneumothorax or effusion. No edema. Normal cardiopericardial silhouette. The left inferior costophrenic angle is clipped off the edge of the film. IMPRESSION: No acute cardiopulmonary disease. Electronically Signed   By: Karen Kays M.D.   On: 09/01/2023 19:23    Procedures Procedures    Medications Ordered in ED Medications  lidocaine (LIDODERM) 5 % 1-3 patch (1 patch Transdermal Patch Applied 09/01/23 1952)  ibuprofen (ADVIL) tablet 600 mg (600 mg Oral Given 09/01/23 1855)  acetaminophen (TYLENOL) tablet 1,000 mg (1,000 mg Oral Given 09/01/23 1856)    ED Course/ Medical Decision Making/ A&P Clinical Course as of 09/01/23 2102  Wed Sep 01, 2023  2033  Viral swab negative, CXR without acute. Suspect another viral infection. Symptoms improved on reassessment and he is stable for discharge home with outpatient follow up. [VK]    Clinical Course User Index [VK] Rexford Maus, DO                                 Medical Decision Making This patient presents to the ED with chief complaint(s) of cough, congestion, back pain with pertinent past medical history of schizophrenia, substance use which further complicates the presenting complaint. The complaint involves an extensive differential diagnosis and also carries with it  a high risk of complications and morbidity.    The differential diagnosis includes viral syndrome, pneumonia, pneumothorax, pulmonary edema, pleural effusion, musculoskeletal pain, no midline back tenderness and no trauma making fracture dislocation unlikely, no bowel or bladder incontinence or retention and no neurologic deficits making cauda equina unlikely  Additional history obtained: Additional history obtained from N/A Records reviewed recent ED records  ED Course and Reassessment: On patient's arrival he is hemodynamically stable in no acute distress.  Triage RN reported history of schizophrenia however he is calm and cooperative with normal thought patterns and does not appear acutely psychotic at this time.  Will have viral swab and chest x-ray performed to evaluate for cause of his symptoms and will be given treatment with Tylenol, Motrin and lidocaine patch and will be closely reassessed.  Independent labs interpretation:  The following labs were independently interpreted: viral swab negative  Independent visualization of imaging: - I independently visualized the following imaging with scope of interpretation limited to determining acute life threatening conditions related to emergency care: CXR, which revealed no acute disease  Consultation: - Consulted or discussed management/test interpretation w/  external professional: N/A  Consideration for admission or further workup: Patient has no emergent conditions requiring admission or further work-up at this time and is stable for discharge home with primary care follow-up  Social Determinants of health: N/A    Amount and/or Complexity of Data Reviewed Radiology: ordered.  Risk OTC drugs. Prescription drug management.          Final Clinical Impression(s) / ED Diagnoses Final diagnoses:  Acute bilateral low back pain without sciatica  Viral upper respiratory tract infection    Rx / DC Orders ED Discharge Orders     None         Rexford Maus, DO 09/01/23 2101

## 2023-09-01 NOTE — Discharge Instructions (Signed)
 You were seen in the emergency department for your shortness of breath, back pain, cough and congestion.  You tested negative for COVID, flu and RSV. You likely have another viral infection.  You did not require treatment with any antiviral medications can continue symptomatic treatment with Tylenol and Motrin as needed for fevers and bodyaches and both can be taken up to every 6 hours.  You can try Mucinex or raw honey as needed for your cough and you can use a humidifier or hot steam from the shower to help with your congestion as well as over-the-counter and nasal decongestant sprays.  You can follow-up with your primary doctor in the next few days to have your symptoms rechecked.  You should return to the emergency department for significantly worsening shortness of breath, severe chest pain, repetitive vomiting or if you have any other new or concerning symptoms.

## 2023-09-01 NOTE — ED Triage Notes (Signed)
 EMS reports from the [place], staff reports entered facility and requested help for exhaustion, back pain, SOB, congestion and multiple complaints. EMS reports Pt had disordered delusional thoughts and answers, was [place], and mentioned using meth (unspecified time). Pt also states he does not take his meds because "they killed him before. Hx of Schizophrenia. Pt calm and cooperative on arrival.  BP 117/80 HR 86 RR 16 Sp02 98 CBG 114

## 2023-10-12 ENCOUNTER — Ambulatory Visit (HOSPITAL_COMMUNITY)
Admission: EM | Admit: 2023-10-12 | Discharge: 2023-10-12 | Disposition: A | Discharge status: Home / Self Care | Attending: Nurse Practitioner | Admitting: Nurse Practitioner

## 2023-10-12 DIAGNOSIS — G4700 Insomnia, unspecified: Secondary | ICD-10-CM | POA: Diagnosis not present

## 2023-10-12 DIAGNOSIS — Z008 Encounter for other general examination: Secondary | ICD-10-CM | POA: Diagnosis not present

## 2023-10-12 DIAGNOSIS — Z5901 Sheltered homelessness: Secondary | ICD-10-CM | POA: Insufficient documentation

## 2023-10-12 DIAGNOSIS — Z7689 Persons encountering health services in other specified circumstances: Secondary | ICD-10-CM

## 2023-10-12 DIAGNOSIS — F19159 Other psychoactive substance abuse with psychoactive substance-induced psychotic disorder, unspecified: Secondary | ICD-10-CM | POA: Diagnosis not present

## 2023-10-12 DIAGNOSIS — F32A Depression, unspecified: Secondary | ICD-10-CM | POA: Insufficient documentation

## 2023-10-12 DIAGNOSIS — F419 Anxiety disorder, unspecified: Secondary | ICD-10-CM | POA: Diagnosis not present

## 2023-10-12 DIAGNOSIS — F209 Schizophrenia, unspecified: Secondary | ICD-10-CM | POA: Diagnosis present

## 2023-10-12 DIAGNOSIS — F319 Bipolar disorder, unspecified: Secondary | ICD-10-CM | POA: Diagnosis present

## 2023-10-12 NOTE — Progress Notes (Signed)
   10/12/23 0336  [facility] Triage Screening (Walk-ins at [facility] only)  How Did You Hear About [place]? Self  What Is the Reason for Your Visit/Call Today? Pt is a 43 yo old male who presents to [facility] as a voluntary walk-in, unaccompanied requesting an evaluation so that he can get disability. Pt reports that he has been homeless and struggles with finding food. Pt reports that he has been unable to work.  Pt reports a history of Schizophrenia and Bipolar. Pt denies SI and HI. Pt reports hearing voices stating ,"it's going to be alright". Pt reports drinking a glass of wine an hour ago. Per chart, pt is part of the ACT team at [facility].  How Long Has This Been Causing You Problems? > than 6 months  Have You Recently Had Any Thoughts About Hurting Yourself? No  Are You Planning to Commit Suicide/Harm Yourself At This time? No  Have you Recently Had Thoughts About Hurting Someone Karolee Ohs? No  Are You Planning To Harm Someone At This Time? No  Physical Abuse Denies  Verbal Abuse Denies  Sexual Abuse Denies  Exploitation of patient/patient's resources Denies  Self-Neglect Denies  Possible abuse reported to:  (n/a)  Are you currently experiencing any auditory, visual or other hallucinations? No  Have You Used Any Alcohol or Drugs in the Past 24 Hours? No  Do you have any current medical co-morbidities that require immediate attention? No  Clinician description of patient physical appearance/behavior: Pt was cooperative  What Do You Feel Would Help You the Most Today? Housing Assistance;Financial Resources;Food Assistance;Stress Management  If access to [facility] was not available, would you have sought care in the Emergency Department? Yes  Determination of Need Routine (7 days)  Options For Referral Outpatient Therapy;Medication Management    Flowsheet Row ED from 10/12/2023 in [facility] ED from 09/01/2023 in [facility] Emergency Department at [facility] ED  from 08/22/2023 in [facility] Emergency Department at [facility]  C-SSRS RISK CATEGORY No Risk No Risk No Risk

## 2023-10-12 NOTE — Discharge Instructions (Signed)

## 2023-10-12 NOTE — ED Provider Notes (Signed)
 Behavioral Health Urgent Care Medical Screening Exam  Patient Name: [name] [name] MRN: [id number] Date of Evaluation: 10/12/23 Chief Complaint:  " I need paperwork to prove that I'm on disability" Diagnosis:  Final diagnoses:  Sheltered homelessness  Encounter for psychiatric assessment    History of Present illness: [name] [name] is a 43 y.o. male.  [name] [name] is a 43 y.o. male.  With psychiatric history of anxiety, depression, schizophrenia, polysubstance abuse, substance induced psychotic disorder, and insomnia who presented voluntarily and accompanied by a friend to [facility] with complaints of sheltered homelessness and requesting disability paperwork.  Patient has outpatient psychiatric services for medication management and therapy with [facility] ACT Team.  Pt was seen face-to-face by this provider and chart reviewed.  Patient has history of frequent ED/urgent care visits and inpatient psychiatric hospitalizations due to medication nonadherence.  However with this visit, patient is calm and cooperative and coherent.  He reports being medication adherent and is currently taking Zyprexa and Geodon daily. He also reports being compliant with his monthly LAI through [facility], last dose received in March. Tonight, patient reports "I'm here to get back my disability check, my disability checks got cut off, I need papers to prove that on disability, I need to go to social security office and get an appointment".  Patient reports he lives with his mother and brother but homeless. Pt is encouraged to reach out to his ACT Team in the am for assistance with his request. Pt verbalizes his understanding  On evaluation, patient is alert, oriented x 3, and cooperative. Speech is clear, and coherent. Pt appears appropriately dressed for the environment.  Eye contact is good. Mood is euthymic, affect is congruent with mood. Thought process is coherent and goal directed and thought content is  WDL.  Pt denies SI/HI/AVH. There is no objective indication that the patient is responding to internal stimuli. No delusions elicited during this assessment.     Flowsheet Row ED from 10/12/2023 in [facility] ED from 09/01/2023 in [facility] Emergency Department at [facility] ED from 08/22/2023 in [facility] Emergency Department at [facility]  C-SSRS RISK CATEGORY No Risk No Risk No Risk       Psychiatric Specialty Exam  Presentation  General Appearance:Appropriate for Environment  Eye Contact:Good  Speech:Clear and Coherent  Speech Volume:Normal  Handedness:Right   Mood and Affect  Mood: Euthymic  Affect: Congruent   Thought Process  Thought Processes: Coherent; Goal Directed  Descriptions of Associations:Intact  Orientation:Full (Time, Place and Person)  Thought Content:WDL  Diagnosis of Schizophrenia or Schizoaffective disorder in past: Yes  Duration of Psychotic Symptoms: Greater than six months  Hallucinations:None hears voices telling him good things  Ideas of Reference:None  Suicidal Thoughts:No  Homicidal Thoughts:No With Intent; With Plan; With Means to Carry Out   Sensorium  Memory: Immediate Good  Judgment: Intact  Insight: Present   Executive Functions  Concentration: Good  Attention Span: Good  Recall: Good  Fund of Knowledge: Good  Language: Good   Psychomotor Activity  Psychomotor Activity: Normal   Assets  Assets: Communication Skills; Desire for Improvement; Social Support   Sleep  Sleep: Good  Number of hours:  6   Physical Exam: Physical Exam Constitutional:      General: He is not in acute distress.    Appearance: He is not diaphoretic.  HENT:     Head: Normocephalic.     Nose: No congestion.  Cardiovascular:  Rate and Rhythm: Normal rate.  Pulmonary:     Effort: No respiratory distress.  Chest:     Chest wall: No tenderness.   Neurological:     Mental Status: He is alert and oriented to person, place, and time.  Psychiatric:        Attention and Perception: Attention and perception normal.        Mood and Affect: Mood and affect normal.        Speech: Speech normal.        Behavior: Behavior is cooperative.        Thought Content: Thought content normal.    Review of Systems  Constitutional:  Negative for chills, diaphoresis and fever.  HENT:  Negative for congestion.   Eyes:  Negative for discharge.  Respiratory:  Negative for cough, shortness of breath and wheezing.   Cardiovascular:  Negative for chest pain and palpitations.  Gastrointestinal:  Negative for diarrhea, nausea and vomiting.  Neurological:  Negative for dizziness, weakness and headaches.  Psychiatric/Behavioral: Negative.     Blood pressure (!) 141/86, pulse 96, temperature 98.5 F (36.9 C), temperature source Oral, resp. rate 18, SpO2 97%. There is no height or weight on file to calculate BMI.  Musculoskeletal: Strength & Muscle Tone: within normal limits Gait & Station: normal Patient leans: N/A   [facility] MSE Discharge Disposition for Follow up and Recommendations: Based on my evaluation the patient does not appear to have an emergency medical condition and can be discharged with resources and follow up care in outpatient services for Medication Management and Individual Therapy.  Recommend discharge and follow-up with [facility] ACT team for mental health concerns and disability paperwork assistance. Patient is also provided with resources for area homeless shelters.   Patient denies SI/HI/AVH or paranoia.  Patient does not meet inpatient psychiatric admission criteria or IVC criteria at this time.  There is no evidence of imminent risk of harm to self or others.  Discharge recommendations:  Patient is to take medications as prescribed. Please see information for follow-up appointment with psychiatry and therapy. Please  follow up with your primary care provider for all medical related needs.   Therapy: We recommend that patient participate in individual therapy to address mental health concerns.  Medications: The patient or guardian is to contact a medical professional and/or outpatient provider to address any new side effects that develop. The patient or guardian should update outpatient providers of any new medications and/or medication changes.   Atypical antipsychotics: If you are prescribed an atypical antipsychotic, it is recommended that your height, weight, BMI, blood pressure, fasting lipid panel, and fasting blood sugar be monitored by your outpatient providers.  Safety:  The patient should abstain from use of illicit substances/drugs and abuse of any medications. If symptoms worsen or do not continue to improve or if the patient becomes actively suicidal or homicidal then it is recommended that the patient return to the closest hospital emergency department, the [facility], or call 911 for further evaluation and treatment. National Suicide Prevention Lifeline 1-800-SUICIDE or (873)702-3020.  About 988 988 offers 24/7 access to trained crisis counselors who can help people experiencing mental health-related distress. People can call or text 988 or chat 988lifeline.org for themselves or if they are worried about a loved one who may need crisis support.  Crisis Mobile: [facility]:                     5015946869 (  for crisis response 24 hours a day) [facility] Hotline:                                            651-440-2297   Patient discharged in stable condition. Area homeless shelter resources provided.   Mancel Bale, NP 10/12/2023, 4:27 AM

## 2023-10-21 ENCOUNTER — Ambulatory Visit (HOSPITAL_COMMUNITY)
Admission: EM | Admit: 2023-10-21 | Discharge: 2023-10-21 | Disposition: A | Discharge status: Home / Self Care | Attending: Psychiatry | Admitting: Psychiatry

## 2023-10-21 ENCOUNTER — Ambulatory Visit (HOSPITAL_COMMUNITY): Admission: EM | Admit: 2023-10-21 | Discharge: 2023-10-21 | Disposition: A | Discharge status: Home / Self Care

## 2023-10-21 DIAGNOSIS — F22 Delusional disorders: Secondary | ICD-10-CM | POA: Diagnosis present

## 2023-10-21 DIAGNOSIS — Z5900 Homelessness unspecified: Secondary | ICD-10-CM | POA: Insufficient documentation

## 2023-10-21 DIAGNOSIS — F19182 Other psychoactive substance abuse with psychoactive substance-induced sleep disorder: Secondary | ICD-10-CM | POA: Diagnosis not present

## 2023-10-21 DIAGNOSIS — F209 Schizophrenia, unspecified: Secondary | ICD-10-CM | POA: Insufficient documentation

## 2023-10-21 DIAGNOSIS — Z765 Malingerer [conscious simulation]: Secondary | ICD-10-CM | POA: Insufficient documentation

## 2023-10-21 DIAGNOSIS — F19159 Other psychoactive substance abuse with psychoactive substance-induced psychotic disorder, unspecified: Secondary | ICD-10-CM | POA: Insufficient documentation

## 2023-10-21 MED ORDER — OLANZAPINE 5 MG PO TABS
5.0000 mg | ORAL_TABLET | Freq: Once | ORAL | Status: AC
  Administered 2023-10-21: 5 mg via ORAL
  Filled 2023-10-21: qty 1

## 2023-10-21 MED ORDER — HYDROXYZINE HCL 25 MG PO TABS
25.0000 mg | ORAL_TABLET | Freq: Once | ORAL | Status: AC
  Administered 2023-10-21: 25 mg via ORAL
  Filled 2023-10-21: qty 1
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
## 2023-10-21 NOTE — Discharge Instructions (Signed)
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
## 2023-10-21 NOTE — ED Provider Notes (Signed)
 Behavioral Health Urgent Care Medical Screening Exam  Patient Name: [name] [name] MRN: [id number] Date of Evaluation: 10/21/23 Chief Complaint:  " I need a shelter".  Diagnosis:  Final diagnoses:  Homelessness unspecified  Malingering    History of Present illness: [name] [name] is a 43 y.o. male. with psychiatric history of anxiety, depression, schizophrenia, polysubstance abuse, substance induced psychotic disorder, and insomnia who presented voluntarily to [facility] via  [facility] with complaints of homelessness and his family being against him/unsupportive.  Patient was seen face to face by this provider and chat reviewed.  Patient is guarded and irritable and reports he is homeless and is seeking shelter at the [facility] because his family and someone is trying to hurt him. Patient is asked about details of who is trying to hurt him, and he responds " I don't know".   Patient is asked for more details of his claims and he responds " I don't know who is trying to hurt me, you ask stupid questions, you are stupid, I don't have an answer for you, you are stupid".   Patient is asked how we can assist him, and he responds " I need a shelter, or I got to walk all the way home".  Patient is requesting to be discharged. He reports not taking his medications (Zyprexa and Atarax) today. He is amenable to getting a dose of these meds at the [facility] prior to discharge.  On evaluation, patient is alert, oriented x 3, and minimally cooperative. Speech is clear and coherent. Pt appears casually dressed. Eye contact is fleeting. Mood is anxious, affect is congruent with mood. Thought process is linear and thought content is paranoid ideation. Pt denies SI/HI/AVH. There is no objective indication that the patient is responding to internal stimuli. No delusions elicited during this assessment.    Discussed follow up with [facility] Act Team for outpatient psychiatric needs in the am. Patient verbalized his  understanding and is in agreement. Lorrane Rosette Row ED from 10/21/2023 in [facility] ED from 10/12/20[facility] in [facility] ED from 09/01/2023 in [facility] Emergency Department at [facility]  C-SSRS RISK CATEGORY No Risk No Risk No Risk       Psychiatric Specialty Exam  Presentation  General Appearance:Casual  Eye Contact:Fleeting  Speech:Clear and Coherent  Speech Volume:Normal  Handedness:Right   Mood and Affect  Mood: Anxious  Affect: Congruent   Thought Process  Thought Processes: Linear  Descriptions of Associations:Circumstantial  Orientation:Partial  Thought Content:Paranoid Ideation  Diagnosis of Schizophrenia or Schizoaffective disorder in past: No   Hallucinations:None hears voices telling him good things  Ideas of Reference:None  Suicidal Thoughts:No  Homicidal Thoughts:No With Intent; With Plan; With Means to Carry Out   Sensorium  Memory: Immediate Fair  Judgment: Poor  Insight: Poor   Executive Functions  Concentration: Fair  Attention Span: Fair  Recall: [employer] of Knowledge: Fair  Language: Fair   Psychomotor Activity  Psychomotor Activity: Mannerisms   Assets  Assets: Manufacturing systems engineer; Desire for Improvement   Sleep  Sleep: Poor  Number of hours:  6   Physical Exam: Physical Exam Constitutional:      General: He is not in acute distress.    Appearance: He is not diaphoretic.  HENT:     Head: Normocephalic.     Nose: No congestion.  Eyes:     General:        Right eye: No discharge.  Left eye: No discharge.  Cardiovascular:     Rate and Rhythm: Normal rate.  Pulmonary:     Effort: No respiratory distress.  Chest:     Chest wall: No tenderness.  Neurological:     Mental Status: He is alert and oriented to person, place, and time.  Psychiatric:        Mood and Affect: Mood is anxious. Affect is labile.         Speech: Speech normal.        Behavior: Behavior is cooperative.        Thought Content: Thought content normal.    Review of Systems  Constitutional:  Negative for chills, diaphoresis and fever.  HENT:  Negative for congestion.   Eyes:  Negative for pain.  Respiratory:  Negative for cough, shortness of breath and wheezing.   Cardiovascular:  Negative for chest pain and palpitations.  Gastrointestinal:  Negative for diarrhea, nausea and vomiting.  Neurological:  Negative for dizziness, weakness and headaches.  Psychiatric/Behavioral:  The patient is nervous/anxious.    Blood pressure 122/83, pulse 90, temperature 98 F (36.7 C), temperature source Oral, resp. rate 18, SpO2 98%. There is no height or weight on file to calculate BMI.  Musculoskeletal: Strength & Muscle Tone: within normal limits Gait & Station: normal Patient leans: N/A   [facility] MSE Discharge Disposition for Follow up and Recommendations: Based on my evaluation the patient does not have an emergency medical condition and can follow-up with [facility] team for outpatient psychiatric services.  Patient denies SI/HI/AVH.  Patient does not meet inpatient psychiatric admission criteria or IVC criteria at this time.  There is no evidence of imminent risk of harm to self or others.  Recommend administration of Zyprexa 5 mg p.o.once for mood stabilization and hydroxyzine 25 mg p.o. once for anxiety.   Patient's missed medication dose for today.  Recommend discharge.  Area homeless shelter resources provided.  Patient to follow-up with [facility] ACT team in the morning for outpatient psychiatric services.  Discharge recommendations:  Patient is to take medications as prescribed. Please see information for follow-up appointment with psychiatry and therapy. Please follow up with your primary care provider for all medical related needs.   Therapy: We recommend that patient participate in individual therapy  to address mental health concerns.  Medications: The patient or guardian is to contact a medical professional and/or outpatient provider to address any new side effects that develop. The patient or guardian should update outpatient providers of any new medications and/or medication changes.   Atypical antipsychotics: If you are prescribed an atypical antipsychotic, it is recommended that your height, weight, BMI, blood pressure, fasting lipid panel, and fasting blood sugar be monitored by your outpatient providers.  Safety:  The patient should abstain from use of illicit substances/drugs and abuse of any medications. If symptoms worsen or do not continue to improve or if the patient becomes actively suicidal or homicidal then it is recommended that the patient return to the closest hospital emergency department, the [facility], or call 911 for further evaluation and treatment. National Suicide Prevention Lifeline 1-800-SUICIDE or 607-017-4609.  About 988 988 offers 24/7 access to trained crisis counselors who can help people experiencing mental health-related distress. People can call or text 988 or chat 988lifeline.org for themselves or if they are worried about a loved one who may need crisis support.  Crisis Mobile: [facility]:  (780)806-5313 (for crisis response 24 hours a day) [facility] Hotline:                                            727-032-7615   Patient discharged in stable condition.  Sandralee Crow, NP 10/21/2023, 9:42 PM

## 2023-11-05 ENCOUNTER — Encounter (HOSPITAL_COMMUNITY): Payer: Self-pay

## 2023-11-05 ENCOUNTER — Emergency Department (HOSPITAL_COMMUNITY)
Admission: EM | Admit: 2023-11-05 | Discharge: 2023-11-06 | Disposition: A | Discharge status: Other Acute Inpatient Hospital | Payer: Self-pay | Attending: Emergency Medicine | Admitting: Emergency Medicine

## 2023-11-05 ENCOUNTER — Other Ambulatory Visit: Payer: Self-pay

## 2023-11-05 DIAGNOSIS — R451 Restlessness and agitation: Secondary | ICD-10-CM | POA: Diagnosis not present

## 2023-11-05 DIAGNOSIS — E876 Hypokalemia: Secondary | ICD-10-CM | POA: Diagnosis not present

## 2023-11-05 DIAGNOSIS — F17210 Nicotine dependence, cigarettes, uncomplicated: Secondary | ICD-10-CM | POA: Diagnosis not present

## 2023-11-05 DIAGNOSIS — F1910 Other psychoactive substance abuse, uncomplicated: Secondary | ICD-10-CM | POA: Insufficient documentation

## 2023-11-05 DIAGNOSIS — F22 Delusional disorders: Secondary | ICD-10-CM | POA: Diagnosis present

## 2023-11-05 DIAGNOSIS — Z5900 Homelessness unspecified: Secondary | ICD-10-CM | POA: Diagnosis not present

## 2023-11-05 LAB — COMPREHENSIVE METABOLIC PANEL WITH GFR
ALT: 39 U/L (ref 0–44)
AST: 26 U/L (ref 15–41)
Albumin: 4.2 g/dL (ref 3.5–5.0)
Alkaline Phosphatase: 61 U/L (ref 38–126)
Anion gap: 9 (ref 5–15)
BUN: 8 mg/dL (ref 6–20)
CO2: 25 mmol/L (ref 22–32)
Calcium: 9.2 mg/dL (ref 8.9–10.3)
Chloride: 103 mmol/L (ref 98–111)
Creatinine, Ser: 0.88 mg/dL (ref 0.61–1.24)
GFR, Estimated: >60 mL/min (ref >60)
Glucose, Bld: 90 mg/dL (ref 70–99)
Potassium: 2.9 mmol/L — ABNORMAL LOW (ref 3.5–5.1)
Sodium: 137 mmol/L (ref 135–145)
Total Bilirubin: 0.8 mg/dL (ref 0.0–1.2)
Total Protein: 7.5 g/dL (ref 6.5–8.1)

## 2023-11-05 LAB — CBC WITH DIFFERENTIAL/PLATELET
Abs Immature Granulocytes: 0.02 10*3/uL (ref 0.00–0.07)
Basophils Absolute: 0 10*3/uL (ref 0.0–0.1)
Basophils Relative: 0 %
Eosinophils Absolute: 0.1 10*3/uL (ref 0.0–0.5)
Eosinophils Relative: 1 %
HCT: 44.1 % (ref 39.0–52.0)
Hemoglobin: 14 g/dL (ref 13.0–17.0)
Immature Granulocytes: 0 %
Lymphocytes Relative: 12 %
Lymphs Abs: 0.8 10*3/uL (ref 0.7–4.0)
MCH: 26.4 pg (ref 26.0–34.0)
MCHC: 31.7 g/dL (ref 30.0–36.0)
MCV: 83.2 fL (ref 80.0–100.0)
Monocytes Absolute: 0.5 10*3/uL (ref 0.1–1.0)
Monocytes Relative: 8 %
Neutro Abs: 5.5 10*3/uL (ref 1.7–7.7)
Neutrophils Relative %: 79 %
Platelets: 255 10*3/uL (ref 150–400)
RBC: 5.3 MIL/uL (ref 4.22–5.81)
RDW: 13.3 % (ref 11.5–15.5)
WBC: 6.9 10*3/uL (ref 4.0–10.5)
nRBC: 0 % (ref 0.0–0.2)

## 2023-11-05 LAB — RAPID URINE DRUG SCREEN, HOSP PERFORMED
Amphetamines: POSITIVE — AB
Barbiturates: NOT DETECTED
Benzodiazepines: NOT DETECTED
Cocaine: NOT DETECTED
Opiates: NOT DETECTED
Tetrahydrocannabinol: NOT DETECTED

## 2023-11-05 LAB — ETHANOL: Alcohol, Ethyl (B): <15 mg/dL (ref <15)

## 2023-11-05 MED ORDER — POTASSIUM CHLORIDE 20 MEQ PO PACK
60.0000 meq | PACK | Freq: Once | ORAL | Status: AC
  Administered 2023-11-05: 60 meq via ORAL
  Filled 2023-11-05: qty 3

## 2023-11-05 MED ORDER — STERILE WATER FOR INJECTION IJ SOLN
INTRAMUSCULAR | Status: AC
  Administered 2023-11-05: 1.2 mL
  Filled 2023-11-05: qty 10

## 2023-11-05 MED ORDER — ZIPRASIDONE MESYLATE 20 MG IM SOLR
20.0000 mg | Freq: Once | INTRAMUSCULAR | Status: AC
  Administered 2023-11-05: 20 mg via INTRAMUSCULAR
  Filled 2023-11-05: qty 20
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
## 2023-11-05 NOTE — ED Provider Notes (Signed)
 [place] EMERGENCY DEPARTMENT AT [facility] Provider Note  CSN: [id number] Arrival date & time: 11/05/23 1729  Chief Complaint(s) Altered Mental Status and Psychiatric Evaluation  HPI [name] [name] is a 43 y.o. male history of homelessness, schizophrenia, polysubstance abuse presenting to the emergency department with IVC.  Patient was placed on IVC by family.  Police picked up patient and brought him to the ER.  He was apparently spitting on the police.  He has been taking meth.  Apparently he was threatening his neighbors and very paranoid.  Patient agitated on arrival not willing to participate in interview  After receiving antipsychotic medication the patient reevaluated.  He denies any suicidal homicidal ideation.  He denies any medical complaints or pain.   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 Authorizing Provider  benztropine  (COGENTIN ) 1 MG tablet Take 1 tablet (1 mg total) by mouth 2 (two) times  daily. Patient not taking: Reported on 08/05/2023 04/09/23   Alver Jobs, MD  divalproex  (DEPAKOTE ) 500 MG DR tablet Take 2 tablets (1,000 mg total) by mouth 2 (two) times daily. Patient not taking: Reported on 08/05/2023 04/09/23 04/08/24  Alver Jobs, MD  gabapentin  (NEURONTIN ) 300 MG capsule Take 1 capsule (300 mg total) by mouth 3 (three) times daily. Patient not taking: Reported on 08/05/2023 04/09/23   Alver Jobs, MD  haloperidol  (HALDOL ) 10 MG tablet Take 1 tablet (10 mg total) by mouth 3 (three) times daily. Patient not taking: Reported on 08/05/2023 04/09/23 04/08/24  Alver Jobs, MD  haloperidol  decanoate (HALDOL  DECANOATE) 100 MG/ML injection Inject 3 mLs (300 mg total) into the muscle every 30 (thirty) days. Patient taking differently: Inject 300 mg into the muscle every 28 (twenty-eight) days. 05/10/23   Alver Jobs, MD  hydrOXYzine  (ATARAX ) 50 MG tablet Take 1 tablet (50 mg total) by mouth every 4 (four) hours as needed for anxiety. Patient not taking: Reported on 08/05/2023 04/09/23   Alver Jobs, MD  nicotine  (NICODERM CQ  - DOSED IN MG/24 HOURS) 14 mg/24hr patch Place 1 patch (14 mg total) onto the skin daily. Patient not taking: Reported on 08/05/2023 04/09/23   Alver Jobs, MD  nicotine  polacrilex (NICORETTE ) 2 MG gum Take 2 mg by mouth as needed for smoking cessation (CHEW).    [provider]  OLANZapine  (ZYPREXA ) 20 MG tablet Take 1 tablet (20 mg total) by mouth at bedtime. For mood control Patient not taking: Reported on 08/05/2023 04/09/23   Alver Jobs, MD  temazepam  (RESTORIL ) 15 MG capsule Take 1 capsule (15 mg total) by mouth at bedtime.  Patient not taking: Reported on 08/05/2023 04/09/23   Alver Jobs, MD  traZODone  (DESYREL ) 300 MG tablet Take 1 tablet (300 mg total) by mouth at bedtime. Patient not taking: Reported on 08/05/2023 04/09/23   Alver Jobs, MD  TYLENOL  500 MG tablet Take 500-1,000 mg by mouth every 6 (six) hours as needed for mild pain (pain  score 1-3) or headache.    [provider]                                                                                                                                    Past Surgical History History reviewed. No pertinent surgical history. Family History Family History  Problem Relation Age of Onset   Mental illness Neg Hx     Social History Social History   Tobacco Use   Smoking status: Every Day    Current packs/day: 1.00    Types: Cigarettes   Smokeless tobacco: Former  Building services engineer status: Never Used  Substance Use Topics   Alcohol use: Yes   Drug use: Yes    Types: Marijuana, Methamphetamines   Allergies Patient has no known allergies.  Review of Systems Review of Systems  All other systems reviewed and are negative.   Physical Exam Vital Signs  I have reviewed the triage vital signs BP 125/88   Pulse 76   Temp 98 F (36.7 C) (Oral)   Resp 20   Ht 5\' 3"  (1.6 m)   Wt 72.6 kg   SpO2 99%   BMI 28.35 kg/m  Physical Exam Vitals and nursing note reviewed.  Constitutional:      General: He is not in acute distress.    Appearance: Normal appearance.  HENT:     Mouth/Throat:     Mouth: Mucous membranes are moist.  Eyes:     Conjunctiva/sclera: Conjunctivae normal.  Cardiovascular:     Rate and Rhythm: Normal rate and regular rhythm.  Pulmonary:     Effort: Pulmonary effort is normal. No respiratory distress.     Breath sounds: Normal breath sounds.  Abdominal:     General: Abdomen is flat.     Palpations: Abdomen is soft.     Tenderness: There is no abdominal tenderness.  Musculoskeletal:     Right lower leg: No edema.     Left lower leg: No edema.  Skin:    General: Skin is warm and dry.     Capillary Refill: Capillary refill takes less than 2 seconds.  Neurological:     Mental Status: He is alert and oriented to person, place, and time. Mental status is at baseline.  Psychiatric:        Mood and Affect: Affect is labile and  angry.        Speech: Speech is rapid and pressured.        Behavior: Behavior is uncooperative, agitated and aggressive.  Thought Content: Thought content does not include homicidal or suicidal ideation.     ED Results and Treatments Labs (all labs ordered are listed, but only abnormal results are displayed) Labs Reviewed  COMPREHENSIVE METABOLIC PANEL WITH GFR - Abnormal; Notable for the following components:      Result Value   Potassium 2.9 (*)    All other components within normal limits  RAPID URINE DRUG SCREEN, HOSP PERFORMED - Abnormal; Notable for the following components:   Amphetamines POSITIVE (*)    All other components within normal limits  ETHANOL  CBC WITH DIFFERENTIAL/PLATELET                                                                                                                          Radiology No results found.  Pertinent labs & imaging results that were available during my care of the patient were reviewed by me and considered in my medical decision making (see MDM for details).  Medications Ordered in ED Medications  potassium chloride  (KLOR-CON ) packet 60 mEq (has no administration in time range)  ziprasidone  (GEODON ) injection 20 mg (20 mg Intramuscular Given 11/05/23 1830)  sterile water  (preservative free) injection (1.2 mLs  Given 11/05/23 1832)                                                                                                                                     Procedures Procedures  (including critical care time)  Medical Decision Making / ED Course   MDM:  43 year old presenting to the emergency department with IVC.  After patient able to be more calm, he denies any medical complaints.  He was placed on IVC by family.  I upheld IVC given his agitated state and concerning collateral including threatening neighbors and paranoia.  He will need psychiatric consult.  Labs notable for mild hypokalemia.  Will replete.   Otherwise patient is medically cleared for psychiatric evaluation.      Additional history obtained: -Additional history obtained from police  -External records from outside source obtained and reviewed including: Chart review including previous notes, labs, imaging, consultation notes including prior notes    Lab Tests: -I ordered, reviewed, and interpreted labs.   The pertinent results include:   Labs Reviewed  COMPREHENSIVE METABOLIC PANEL WITH GFR - Abnormal; Notable for the following components:      Result Value   Potassium 2.9 (*)  All other components within normal limits  RAPID URINE DRUG SCREEN, HOSP PERFORMED - Abnormal; Notable for the following components:   Amphetamines POSITIVE (*)    All other components within normal limits  ETHANOL  CBC WITH DIFFERENTIAL/PLATELET    Notable for mild hypokalemia   EKG   EKG Interpretation Date/Time:  Friday Nov 05 2023 19:25:16 EDT Ventricular Rate:  77 PR Interval:  127 QRS Duration:  99 QT Interval:  438 QTC Calculation: 496 R Axis:   88  Text Interpretation: Sinus rhythm No significant change since last tracing Confirmed by Hiawatha Lout ([id number]) on 11/05/2023 7:42:43 PM         Medicines ordered and prescription drug management: Meds ordered this encounter  Medications   ziprasidone  (GEODON ) injection 20 mg   sterile water  (preservative free) injection    Raeanne Bull M: cabinet override   potassium chloride  (KLOR-CON ) packet 60 mEq    -I have reviewed the patients home medicines and have made adjustments as needed   Social Determinants of Health:  Diagnosis or treatment significantly limited by social determinants of health: polysubstance abuse and homelessness   Reevaluation: After the interventions noted above, I reevaluated the patient and found that their symptoms have improved  Co morbidities that complicate the patient evaluation  Past Medical History:  Diagnosis Date   Anxiety     Depression    Homeless    Mental disorder    Schizophrenia (HCC)    TB (tuberculosis)    1998      Dispostion: Disposition decision including need for hospitalization was considered, and patient boarding for psychiatric evaluation    Final Clinical Impression(s) / ED Diagnoses Final diagnoses:  Paranoid behavior (HCC)  Agitation     This chart was dictated using voice recognition software.  Despite best efforts to proofread,  errors can occur which can change the documentation meaning.    Mordecai Applebaum, MD 11/05/23 2119

## 2023-11-05 NOTE — BH Assessment (Signed)
 Clinician messaged Vevelyn Gowers, RN: "Hey. It's Trey with TTS. Is the pt able to engage in the assessment, if the pt medically cleared?"  Clinician awaiting response.   Rosi Converse, MS, [facility], [facility] Triage Specialist [phone]

## 2023-11-06 MED ORDER — GABAPENTIN 300 MG PO CAPS
300.0000 mg | ORAL_CAPSULE | Freq: Three times a day (TID) | ORAL | Status: DC
  Administered 2023-11-06: 300 mg via ORAL
  Filled 2023-11-06: qty 1

## 2023-11-06 MED ORDER — BENZTROPINE MESYLATE 1 MG PO TABS
1.0000 mg | ORAL_TABLET | Freq: Two times a day (BID) | ORAL | Status: DC
  Administered 2023-11-06: 1 mg via ORAL
  Filled 2023-11-06: qty 1

## 2023-11-06 MED ORDER — NICOTINE POLACRILEX 2 MG MT GUM: 2.0000 mg | CHEWING_GUM | OROMUCOSAL | Status: DC | PRN

## 2023-11-06 MED ORDER — HALOPERIDOL 5 MG PO TABS
5.0000 mg | ORAL_TABLET | Freq: Two times a day (BID) | ORAL | Status: DC
  Administered 2023-11-06: 5 mg via ORAL
  Filled 2023-11-06: qty 1

## 2023-11-06 MED ORDER — ACETAMINOPHEN 500 MG PO TABS: 500.0000 mg | ORAL_TABLET | Freq: Four times a day (QID) | ORAL | Status: DC | PRN

## 2023-11-06 NOTE — Progress Notes (Signed)
 Patient has been denied by [facility] due to no appropriate beds available. Patient meets [facility] inpatient criteria per Orvil Bland, NP. Patient has been faxed out to the following facilities:    [facility] HealthPending - Request [facility], [place] [phone]--ZHYQM-VHQIO [facility] HealthPending - Request [address], [place] [phone]--[facility] - Request [address], [place] [phone]--IHKVQ-QVZDGLOV [facility] Ridge[facility]ending - Request Sent--[address] [place], [place] [phone]--NATFT-DDUKGU Health-Behavioral Health Patient PlacementPending - Request [facility], [place]704-306 [phone]--[facility] - Request [address] [place] [phone]--EVOJJ-KKXFGH HealthPending - Request [address] [place] [phone]--[facility] - Request Sent--[place] [phone]--[facility]ending - Request [facility] [place]ina Nicks [place] [phone]--NIDPO-EUM [facility] HealthPending - Request Sent--[address] [place] [phone]--ZTIWP-YKD Ellan Gunner - Request Sent--[address], [phone]--[facility]Adult[facility]ending - Request Sent--[address] [place] [phone]--[facility]BDZH[facility] - Request Sent--[address] [place] [phone]--CXKGY-JEHUDJS [facility] - Request [address] [place], [place] [phone]--HMCNO-BSJGGEZM [place] - Request [address]  [place] [phone]--YFVCB-SWHQPRF [facility] HealthPending - Request [address], [place] [phone]--[facility] - Request Sent--[address], [place] [phone]--XBWIO[facility]C [facility] - Request Sent--[address] [place] Cogan [facility] [phone]--[facility] HealthcarePending - Request [address] [place]  [phone]--   Phares Brasher, MSW, LCSW-A  11:04 AM 11/06/2023

## 2023-11-06 NOTE — ED Notes (Signed)
 Called [facility] to give report and all nurses are busy so they will call back for report.
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
## 2023-11-06 NOTE — Consult Note (Signed)
 Attempted to see patient for psych assessment via tts cart.  Per Phyliss Breen, paramedic working with patient, he's asleep. Psych will plan to see him when he is awake and able to participate.

## 2023-11-06 NOTE — ED Provider Notes (Signed)
 Emergency Medicine Observation Re-evaluation Note  [name] [name] is a 43 y.o. male, seen on rounds today.  Pt initially presented to the ED for complaints of Altered Mental Status and Psychiatric Evaluation Currently, the patient is no acute distress.  Physical Exam  BP 120/80 (BP Location: Left Arm)   Pulse 77   Temp 98 F (36.7 C) (Oral)   Resp 18   Ht 1.6 m (5\' 3" )   Wt 72.6 kg   SpO2 100%   BMI 28.35 kg/m  Physical Exam   ED Course / MDM  EKG:EKG Interpretation Date/Time:  Friday Nov 05 2023 19:25:16 EDT Ventricular Rate:  77 PR Interval:  127 QRS Duration:  99 QT Interval:  438 QTC Calculation: 496 R Axis:   88  Text Interpretation: Sinus rhythm No significant change since last tracing Confirmed by Hiawatha Lout ([id number]) on 11/05/2023 7:42:43 PM  I have reviewed the labs performed to date as well as medications administered while in observation.  Recent changes in the last 24 hours include none.  Plan  Current plan is for psych placement.    Lind Repine, MD 11/06/23 [phone]

## 2023-11-06 NOTE — ED Notes (Signed)
 Pt wanded by security and belongings placed in secure locker #[id number]

## 2023-11-06 NOTE — Progress Notes (Signed)
 [facility] LCSW Progress Note   11/06/2023    11:24 AM  Alexia Angelucci   [id number]   Type of Contact and Topic:  Psychiatric Bed Placement   Pt accepted to [facility]      Patient meets inpatient criteria per Orvil Bland, NP  The attending provider will be Dr. Emilie Harden  Call report to [phone]  Phyliss Breen, Paramedic @ [facility] ED notified.     Pt scheduled  to arrive at [facility] TODAY.    Ashwin Tibbs, MSW, LCSW-A  11:25 AM 11/06/2023

## 2023-11-06 NOTE — ED Notes (Signed)
 Two bags and a hardboard poster placed in [employer] RN cabinet.

## 2023-12-16 ENCOUNTER — Ambulatory Visit (HOSPITAL_COMMUNITY)
Admission: EM | Admit: 2023-12-16 | Discharge: 2023-12-21 | Disposition: A | Discharge status: Other Acute Inpatient Hospital | Attending: Behavioral Health | Admitting: Behavioral Health

## 2023-12-16 DIAGNOSIS — F250 Schizoaffective disorder, bipolar type: Secondary | ICD-10-CM | POA: Diagnosis not present

## 2023-12-16 DIAGNOSIS — F209 Schizophrenia, unspecified: Secondary | ICD-10-CM | POA: Diagnosis present

## 2023-12-16 DIAGNOSIS — F259 Schizoaffective disorder, unspecified: Secondary | ICD-10-CM | POA: Insufficient documentation

## 2023-12-16 DIAGNOSIS — Z5900 Homelessness unspecified: Secondary | ICD-10-CM | POA: Diagnosis not present

## 2023-12-16 DIAGNOSIS — R451 Restlessness and agitation: Secondary | ICD-10-CM | POA: Insufficient documentation

## 2023-12-16 DIAGNOSIS — F1520 Other stimulant dependence, uncomplicated: Secondary | ICD-10-CM

## 2023-12-16 MED ORDER — DIPHENHYDRAMINE HCL 50 MG/ML IJ SOLN
50.0000 mg | Freq: Three times a day (TID) | INTRAMUSCULAR | Status: DC | PRN
  Administered 2023-12-16: 50 mg via INTRAMUSCULAR

## 2023-12-16 MED ORDER — HALOPERIDOL LACTATE 5 MG/ML IJ SOLN
10.0000 mg | Freq: Three times a day (TID) | INTRAMUSCULAR | Status: DC | PRN
  Administered 2023-12-16: 10 mg via INTRAMUSCULAR
  Filled 2023-12-16: qty 2

## 2023-12-16 MED ORDER — MAGNESIUM HYDROXIDE 400 MG/5ML PO SUSP: 30.0000 mL | Freq: Every day | ORAL | Status: DC | PRN

## 2023-12-16 MED ORDER — ACETAMINOPHEN 325 MG PO TABS: 650.0000 mg | ORAL_TABLET | Freq: Four times a day (QID) | ORAL | Status: DC | PRN

## 2023-12-16 MED ORDER — DIPHENHYDRAMINE HCL 50 MG PO CAPS
50.0000 mg | ORAL_CAPSULE | Freq: Three times a day (TID) | ORAL | Status: DC | PRN
  Administered 2023-12-17 – 2023-12-21 (×6): 50 mg via ORAL
  Filled 2023-12-16 (×6): qty 1

## 2023-12-16 MED ORDER — HYDROXYZINE HCL 25 MG PO TABS
25.0000 mg | ORAL_TABLET | Freq: Three times a day (TID) | ORAL | Status: DC | PRN
  Administered 2023-12-17: 25 mg via ORAL
  Filled 2023-12-16: qty 1

## 2023-12-16 MED ORDER — DIPHENHYDRAMINE HCL 50 MG/ML IJ SOLN
50.0000 mg | Freq: Three times a day (TID) | INTRAMUSCULAR | Status: DC | PRN
  Filled 2023-12-16: qty 1

## 2023-12-16 MED ORDER — TRAZODONE HCL 50 MG PO TABS
50.0000 mg | ORAL_TABLET | Freq: Every evening | ORAL | Status: DC | PRN
  Administered 2023-12-17 – 2023-12-20 (×3): 50 mg via ORAL
  Filled 2023-12-16 (×3): qty 1

## 2023-12-16 MED ORDER — LORAZEPAM 2 MG/ML IJ SOLN
2.0000 mg | Freq: Three times a day (TID) | INTRAMUSCULAR | Status: DC | PRN
  Administered 2023-12-16: 2 mg via INTRAMUSCULAR

## 2023-12-16 MED ORDER — LORAZEPAM 2 MG/ML IJ SOLN
2.0000 mg | Freq: Three times a day (TID) | INTRAMUSCULAR | Status: DC | PRN
  Filled 2023-12-16: qty 1

## 2023-12-16 MED ORDER — HALOPERIDOL LACTATE 5 MG/ML IJ SOLN: 5.0000 mg | Freq: Three times a day (TID) | INTRAMUSCULAR | Status: DC | PRN

## 2023-12-16 MED ORDER — ALUM & MAG HYDROXIDE-SIMETH 200-200-20 MG/5ML PO SUSP: 30.0000 mL | ORAL | Status: DC | PRN

## 2023-12-16 MED ORDER — OLANZAPINE 10 MG PO TABS
10.0000 mg | ORAL_TABLET | Freq: Every day | ORAL | Status: DC
  Administered 2023-12-17 – 2023-12-20 (×4): 10 mg via ORAL
  Filled 2023-12-16 (×4): qty 1

## 2023-12-16 MED ORDER — HALOPERIDOL 5 MG PO TABS
5.0000 mg | ORAL_TABLET | Freq: Three times a day (TID) | ORAL | Status: DC | PRN
  Administered 2023-12-17 – 2023-12-21 (×6): 5 mg via ORAL
  Filled 2023-12-16 (×6): qty 1
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
## 2023-12-16 NOTE — ED Notes (Signed)
 Patient resting quietly in bed with eyes closed, Respirations equal and unlabored, skin warm and dry, NAD. Routine safety checks conducted according to facility protocol. Will continue to monitor for safety.

## 2023-12-16 NOTE — ED Notes (Signed)
 Patient aggressive in assessment room, verbally agitated and threatening with staff. Patient heard punching wall, screaming that he's claustrophobic and can't be inside. Security at bedside, IM agitation meds given with manual hold at [phone]. Patient visibly agitated, environment secured, safety maintained.

## 2023-12-16 NOTE — ED Provider Notes (Signed)
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 by mouth 2 (two) times daily. (Patient not taking: Reported on 08/05/2023)   gabapentin   (NEURONTIN ) 300 MG capsule Take 1 capsule (300 mg total) by mouth 3 (three) times daily. (Patient not taking: Reported on 08/05/2023)   divalproex  (DEPAKOTE ) 500 MG DR tablet Take 2 tablets (1,000 mg total) by mouth 2 (two) times daily. (Patient not taking: Reported on 08/05/2023)   nicotine  polacrilex (NICORETTE ) 2 MG gum Take 2 mg by mouth as needed for smoking cessation (CHEW). (Patient not taking: Reported on 11/06/2023)   TYLENOL  500 MG tablet Take 500-1,000 mg by mouth every 6 (six) hours as needed for mild pain (pain score 1-3) or headache. (Patient not taking: Reported on 11/06/2023)     Medical Decision Making  Patient is recommended for inpatient psychiatric treatment for mood stabilization and medication management due to acute psychosis as evidenced by disorganized thoughts, paranoia and delusional thought content and agitation. Patient placed under IVC by this provider. [name] was brought to the [facility] by law enforcement after his brother reported that he was delusional and paranoid, feeling like people are out to get him and fears for his life. Per officer, respondent was making statements on arrival, they murdered me. On evaluation, respondent states that his brother is trying to kill him and that his brother eats people and will eat him. [name] is noted to have 666 engraved/burned backwards on his forehead and states that they are ring worms so they won't eat him because they hate ring worms. He states that he tattooed his head a couple days ago. [name] states that he stopped taking his medications. [name] has a significant history of threatening and aggressive behaviors when he's not stabilized and is a danger to self and others.   Labs Lab Orders         SARS Coronavirus 2 by RT PCR (hospital order, performed in [facility] hospital lab) *cepheid  single result test* Anterior Nasal Swab         CBC with Differential/Platelet          Comprehensive metabolic panel         Hemoglobin A1c         Lipid panel         Ethanol         TSH         POCT Urine Drug Screen - (I-Screen)    EKG   Medications Will start Zyprexa  10 mg at bedtime for psychosis   PRN medications: acetaminophen , alum & mag hydroxide-simeth, haloperidol  **AND** diphenhydrAMINE , haloperidol  lactate **AND** diphenhydrAMINE  **AND** LORazepam , haloperidol  lactate **AND** diphenhydrAMINE  **AND** LORazepam , hydrOXYzine , magnesium  hydroxide, traZODone    Recommendations  Based on my evaluation the patient does not appear to have an emergency medical condition.  Shakeyla Giebler L, NP 12/16/23  6:00 PM
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
## 2023-12-16 NOTE — Progress Notes (Signed)
 Unable to complete admission due to pt being hostile, verbally aggressive and threatening staff. Unable to obtain labs or complete skin assessment due to the same. Pt currently has string in his pants. Oncoming shift updated.

## 2023-12-17 ENCOUNTER — Other Ambulatory Visit: Payer: Self-pay

## 2023-12-17 DIAGNOSIS — F209 Schizophrenia, unspecified: Secondary | ICD-10-CM | POA: Diagnosis not present

## 2023-12-17 DIAGNOSIS — F259 Schizoaffective disorder, unspecified: Secondary | ICD-10-CM | POA: Diagnosis not present

## 2023-12-17 LAB — LIPID PANEL
Cholesterol: 114 mg/dL (ref 0–200)
HDL: 53 mg/dL (ref >40)
LDL Cholesterol: 55 mg/dL (ref 0–99)
Total CHOL/HDL Ratio: 2.2 ratio
Triglycerides: 30 mg/dL (ref <150)
VLDL: 6 mg/dL (ref 0–40)

## 2023-12-17 LAB — CBC WITH DIFFERENTIAL/PLATELET
Abs Immature Granulocytes: 0 10*3/uL (ref 0.00–0.07)
Basophils Absolute: 0 10*3/uL (ref 0.0–0.1)
Basophils Relative: 1 %
Eosinophils Absolute: 0.2 10*3/uL (ref 0.0–0.5)
Eosinophils Relative: 4 %
HCT: 43.9 % (ref 39.0–52.0)
Hemoglobin: 14.2 g/dL (ref 13.0–17.0)
Immature Granulocytes: 0 %
Lymphocytes Relative: 15 %
Lymphs Abs: 0.7 10*3/uL (ref 0.7–4.0)
MCH: 26.9 pg (ref 26.0–34.0)
MCHC: 32.3 g/dL (ref 30.0–36.0)
MCV: 83.3 fL (ref 80.0–100.0)
Monocytes Absolute: 0.4 10*3/uL (ref 0.1–1.0)
Monocytes Relative: 9 %
Neutro Abs: 3.5 10*3/uL (ref 1.7–7.7)
Neutrophils Relative %: 71 %
Platelets: 247 10*3/uL (ref 150–400)
RBC: 5.27 MIL/uL (ref 4.22–5.81)
RDW: 13.2 % (ref 11.5–15.5)
WBC: 4.8 10*3/uL (ref 4.0–10.5)
nRBC: 0 % (ref 0.0–0.2)

## 2023-12-17 LAB — HEMOGLOBIN A1C
Hgb A1c MFr Bld: 5.2 % (ref 4.8–5.6)
Mean Plasma Glucose: 102.54 mg/dL

## 2023-12-17 LAB — COMPREHENSIVE METABOLIC PANEL WITH GFR
ALT: 31 U/L (ref 0–44)
AST: 50 U/L — ABNORMAL HIGH (ref 15–41)
Albumin: 4 g/dL (ref 3.5–5.0)
Alkaline Phosphatase: 72 U/L (ref 38–126)
Anion gap: 10 (ref 5–15)
BUN: 9 mg/dL (ref 6–20)
CO2: 24 mmol/L (ref 22–32)
Calcium: 9.3 mg/dL (ref 8.9–10.3)
Chloride: 107 mmol/L (ref 98–111)
Creatinine, Ser: 0.85 mg/dL (ref 0.61–1.24)
GFR, Estimated: >60 mL/min (ref >60)
Glucose, Bld: 113 mg/dL — ABNORMAL HIGH (ref 70–99)
Potassium: 3.7 mmol/L (ref 3.5–5.1)
Sodium: 141 mmol/L (ref 135–145)
Total Bilirubin: 1 mg/dL (ref 0.0–1.2)
Total Protein: 7.3 g/dL (ref 6.5–8.1)

## 2023-12-17 LAB — POCT URINE DRUG SCREEN - MANUAL ENTRY (I-SCREEN)
POC Amphetamine UR: POSITIVE — AB
POC Buprenorphine (BUP): NOT DETECTED
POC Cocaine UR: POSITIVE — AB
POC Marijuana UR: NOT DETECTED
POC Methadone UR: NOT DETECTED
POC Methamphetamine UR: POSITIVE — AB
POC Morphine: NOT DETECTED
POC Oxazepam (BZO): POSITIVE — AB
POC Oxycodone UR: NOT DETECTED
POC Secobarbital (BAR): NOT DETECTED

## 2023-12-17 LAB — SARS CORONAVIRUS 2 BY RT PCR: SARS Coronavirus 2 by RT PCR: NEGATIVE

## 2023-12-17 LAB — ETHANOL: Alcohol, Ethyl (B): <15 mg/dL (ref <15)

## 2023-12-17 LAB — TSH: TSH: 0.393 u[IU]/mL (ref 0.350–4.500)

## 2023-12-17 NOTE — ED Notes (Signed)
 Patient resting with eyes closed. Respirations even and unlabored. No distress noted. Environment secured. Plan of care ongoing, no further concerns as of present.

## 2023-12-17 NOTE — Progress Notes (Signed)
 Pt has been accepted to [facility] on 12/17/2023  Bed assignment: Main campus  Pt meets inpatient criteria per Nickola Baron, NP   Attending Physician will be Lavona Pounds, MD  Report can be called to: [phone]   Pt can arrive ASAP  Care Team Notified:  Nickola Baron, NP, Bo Burly, RN

## 2023-12-17 NOTE — Progress Notes (Signed)
 Inpatient Psychiatric Referral  Patient was recommended inpatient per Nickola Baron, NP . There are no available beds at [facility], per [facility] . Patient was referred to the following out of network facilities:  Destination  Service Provider Request Status Address Phone Fax  [facility]  Pending - Request Sent [address] [place], [place] [address] [phone]  [facility]  Pending - Request Sent [address] [place] [address] [phone]  [facility]  Pending - Request Sent [address] [place] [address] [phone]  [facility]  Pending - Request Sent [address] [place] [address] [phone]  [facility]  Pending - Request Sent [address], [place] [address] [phone]  [facility]  Pending - Request Sent [address] [place] Decree [place] [phone]  [facility]  Pending - Request Sent [address] [place] [address] [phone]    Situation ongoing, CSW to continue following and update chart as more information becomes available.   Albertus Alt MSW, LCSWA 12/17/2023  3:12PM

## 2023-12-17 NOTE — ED Provider Notes (Cosign Needed Addendum)
 FBC/OBS ASAP Discharge Summary  Date and Time: 12/17/2023 10:37 AM Name: [name] [name] MRN:  [id number]     Subjective:On evaluation today, patient is alert and oriented x 3. Thought process is linear. Thought content is negative for SI/HI/AVH. Objectively, he does not appear to be responding to internal or external stimuli. His speech is slow and he forwards little information. His mood is dysphoric. He denies medication side effects, no involuntary movements, muscle spasms or N/V. He complains of generalized pain all over his body. He declined pain medications. UDS positive for amphetamine, benzo (given at facility), cocaine and methamphetamine.  Stay Summary: [name] [name] is a 43 year old male patient with a past psychiatric history significant for schizophrenia, schizoaffective disorder, cocaine abuse, methamphetamine use, substance-induced psychosis, alcohol use disorder, anxiety, and insomnia who presented to the [facility] on 12/16/23. Patient was placed under IVC by this provider due to concerns for acute psychosis AEB delusional and paranoia thought content, disorganized thoughts and threatening behaviors. Patient was started on Zyprexa  10 mg po at bedtime for psychosis. Labs: CBC, CMP, A1C, Lipid, TSH, UDS BAL and EKG obtained. UDS positive for amphetamine, benzo (given at facility), cocaine and methamphetamine. BAL negative and AST slightly elevated 50. Patient accepted to [facility] on 12/17/23.   Diagnosis:  Final diagnoses:  Schizophrenia, unspecified type (HCC)    Total Time spent with patient: 20 minutes  Past Psychiatric History: History of schizophrenia, schizoaffective disorder, cocaine abuse, methamphetamine use, substance-induced psychosis, alcohol use disorder, anxiety, and insomnia. He has an extensive inpatient psychiatric hospitalizations and has been hospitalized at [facility] numerous times: September 2024, April 2024, May 2022, January 2020, June 2019, September 2016,  September 2001, August 2001 and July 2001. Patient is established with [facility].     Past Medical History: No reported history   Family History: No known history reported   Social History: Homeless. Unemployed. History of methamphetamine and cocaine use.  Tobacco Cessation:  A prescription for an FDA-approved tobacco cessation medication was offered at discharge and the patient refused  Additional Social History:    Pain Medications: See MAR Prescriptions: See MAR Over the Counter: See MAR History of alcohol / drug use?: Yes Longest period of sobriety (when/how long): UTA Negative Consequences of Use: Personal relationships, Legal, Financial Withdrawal Symptoms: None Name of Substance 1: methamphetamine 1 - Age of First Use: unknown 1 - Amount (size/oz): unable to assess 1 - Frequency: unable to assess 1 - Duration: unable to assess 1 - Last Use / Amount: possibly today 1 - Method of Aquiring: unknown 1- Route of Use: smoke Name of Substance 2: Marijuana 2 - Age of First Use: unable to assess 2 - Amount (size/oz): unable to assess 2 - Frequency: occasionally 2 - Duration: unable to assess 2 - Last Use / Amount: unable to assess 2 - Method of Aquiring: unknown 2 - Route of Substance Use: smoking   Current Medications:  Current Facility-Administered Medications  Medication Dose Route Frequency Provider Last Rate Last Admin   acetaminophen  (TYLENOL ) tablet 650 mg  650 mg Oral Q6H PRN Oaklan Persons L, NP       alum & mag hydroxide-simeth (MAALOX/MYLANTA) 200-200-20 MG/5ML suspension 30 mL  30 mL Oral Q4H PRN Zahraa Bhargava L, NP       haloperidol  (HALDOL ) tablet 5 mg  5 mg Oral TID PRN Donell Sliwinski L, NP       And   diphenhydrAMINE  (BENADRYL ) capsule 50 mg  50 mg Oral  TID PRN Rhylei Mcquaig L, NP       haloperidol  lactate (HALDOL ) injection 5 mg  5 mg Intramuscular TID PRN Desha Bitner L, NP       And   diphenhydrAMINE  (BENADRYL ) injection 50 mg  50  mg Intramuscular TID PRN Minerva Bluett L, NP       And   LORazepam  (ATIVAN ) injection 2 mg  2 mg Intramuscular TID PRN Ceili Boshers L, NP       haloperidol  lactate (HALDOL ) injection 10 mg  10 mg Intramuscular TID PRN Candice Tobey L, NP   10 mg at 12/16/23 1744   And   diphenhydrAMINE  (BENADRYL ) injection 50 mg  50 mg Intramuscular TID PRN Nickola Baron L, NP   50 mg at 12/16/23 1744   And   LORazepam  (ATIVAN ) injection 2 mg  2 mg Intramuscular TID PRN Nickola Baron L, NP   2 mg at 12/16/23 1745   hydrOXYzine  (ATARAX ) tablet 25 mg  25 mg Oral TID PRN Vianney Kopecky L, NP       magnesium  hydroxide (MILK OF MAGNESIA) suspension 30 mL  30 mL Oral Daily PRN Fianna Snowball L, NP       OLANZapine  (ZYPREXA ) tablet 10 mg  10 mg Oral QHS Shaleigh Laubscher L, NP       traZODone  (DESYREL ) tablet 50 mg  50 mg Oral QHS PRN Bobette Leyh L, NP       Current Outpatient Medications  Medication Sig Dispense Refill   benztropine  (COGENTIN ) 1 MG tablet Take 1 tablet (1 mg total) by mouth 2 (two) times daily. 60 tablet 0   divalproex  (DEPAKOTE ) 500 MG DR tablet Take 2 tablets (1,000 mg total) by mouth 2 (two) times daily. 120 tablet 0   gabapentin  (NEURONTIN ) 300 MG capsule Take 1 capsule (300 mg total) by mouth 3 (three) times daily. 90 capsule 0   haloperidol  (HALDOL ) 10 MG tablet Take 1 tablet (10 mg total) by mouth 3 (three) times daily. 90 tablet 0   haloperidol  decanoate (HALDOL  DECANOATE) 100 MG/ML injection Inject 3 mLs (300 mg total) into the muscle every 30 (thirty) days. 3 mL 0   traZODone  (DESYREL ) 300 MG tablet Take 1 tablet (300 mg total) by mouth at bedtime. 30 tablet 0    Labs  Lab Results:  Admission on 12/16/2023  Component Date Value Ref Range Status   WBC 12/17/2023 4.8  4.0 - 10.5 K/uL Final   RBC 12/17/2023 5.27  4.22 - 5.81 MIL/uL Final   Hemoglobin 12/17/2023 14.2  13.0 - 17.0 g/dL Final   HCT 84/69/6295 43.9  39.0 - 52.0 % Final   MCV 12/17/2023 83.3  80.0 - 100.0 fL Final    MCH 12/17/2023 26.9  26.0 - 34.0 pg Final   MCHC 12/17/2023 32.3  30.0 - 36.0 g/dL Final   RDW 28/41/3244 13.2  11.5 - 15.5 % Final   Platelets 12/17/2023 247  150 - 400 K/uL Final   nRBC 12/17/2023 0.0  0.0 - 0.2 % Final   Neutrophils Relative % 12/17/2023 71  % Final   Neutro Abs 12/17/2023 3.5  1.7 - 7.7 K/uL Final   Lymphocytes Relative 12/17/2023 15  % Final   Lymphs Abs 12/17/2023 0.7  0.7 - 4.0 K/uL Final   Monocytes Relative 12/17/2023 9  % Final   Monocytes Absolute 12/17/2023 0.4  0.1 - 1.0 K/uL Final   Eosinophils Relative 12/17/2023 4  % Final   Eosinophils Absolute 12/17/2023 0.2  0.0 -  0.5 K/uL Final   Basophils Relative 12/17/2023 1  % Final   Basophils Absolute 12/17/2023 0.0  0.0 - 0.1 K/uL Final   Immature Granulocytes 12/17/2023 0  % Final   Abs Immature Granulocytes 12/17/2023 0.00  0.00 - 0.07 K/uL Final   Performed at [facility], [address] [place], [place] [address]   Sodium 12/17/2023 141  135 - 145 mmol/L Final   Potassium 12/17/2023 3.7  3.5 - 5.1 mmol/L Final   Chloride 12/17/2023 107  98 - 111 mmol/L Final   CO2 12/17/2023 24  22 - 32 mmol/L Final   Glucose, Bld 12/17/2023 113 (H)  70 - 99 mg/dL Final   Glucose reference range applies only to samples taken after fasting for at least 8 hours.   BUN 12/17/2023 9  6 - 20 mg/dL Final   Creatinine, Ser 12/17/2023 0.85  0.61 - 1.24 mg/dL Final   Calcium 60/45/4098 9.3  8.9 - 10.3 mg/dL Final   Total Protein 11/91/4782 7.3  6.5 - 8.1 g/dL Final   Albumin 95/62/1308 4.0  3.5 - 5.0 g/dL Final   AST 65/78/4696 50 (H)  15 - 41 U/L Final   ALT 12/17/2023 31  0 - 44 U/L Final   Alkaline Phosphatase 12/17/2023 72  38 - 126 U/L Final   Total Bilirubin 12/17/2023 1.0  0.0 - 1.2 mg/dL Final   GFR, Estimated 12/17/2023 >60  >60 mL/min Final   Comment: (NOTE) Calculated using the CKD-EPI Creatinine Equation (2021)    Anion gap 12/17/2023 10  5 - 15 Final   Performed at [facility], [address], [place], [place] [address]   Hgb A1c MFr Bld 12/17/2023 5.2  4.8 - 5.6 % Final   Comment: (NOTE) Diagnosis of Diabetes The following HbA1c ranges recommended by the American Diabetes Association (ADA) may be used as an aid in the diagnosis of diabetes mellitus.  Hemoglobin             Suggested A1C NGSP%              Diagnosis  <5.7                   Non Diabetic  5.7-6.4                Pre-Diabetic  >6.4                   Diabetic  <7.0                   Glycemic control for                       adults with diabetes.     Mean Plasma Glucose 12/17/2023 102.54  mg/dL Final   Performed at [facility], [address] [place], [place] [address]   Alcohol, Ethyl (B) 12/17/2023 <15  <15 mg/dL Final   Comment: (NOTE) For medical purposes only. Performed at [facility], [address], [place], [place] [address]    POC Amphetamine UR 12/17/2023 Positive (A)  NONE DETECTED (Cut Off Level 1000 ng/mL) Final   POC Secobarbital (BAR) 12/17/2023 None Detected  NONE DETECTED (Cut Off Level 300 ng/mL) Final   POC Buprenorphine (BUP) 12/17/2023 None Detected  NONE DETECTED (Cut Off Level 10 ng/mL) Final   POC Oxazepam (BZO) 12/17/2023 Positive (A)  NONE DETECTED (Cut Off Level 300 ng/mL) Final  POC Cocaine UR 12/17/2023 Positive (A)  NONE DETECTED (Cut Off Level 300 ng/mL) Final   POC Methamphetamine UR 12/17/2023 Positive (A)  NONE DETECTED (Cut Off Level 1000 ng/mL) Final   POC Morphine 12/17/2023 None Detected  NONE DETECTED (Cut Off Level 300 ng/mL) Final   POC Methadone UR 12/17/2023 None Detected  NONE DETECTED (Cut Off Level 300 ng/mL) Final   POC Oxycodone UR 12/17/2023 None Detected  NONE DETECTED (Cut Off Level 100 ng/mL) Final   POC Marijuana UR 12/17/2023 None Detected  NONE DETECTED (Cut Off Level 50 ng/mL) Final  Admission on 11/05/2023, Discharged on 11/06/2023  Component Date Value Ref Range Status   Sodium 11/05/2023 137  135 - 145 mmol/L Final   Potassium  11/05/2023 2.9 (L)  3.5 - 5.1 mmol/L Final   Chloride 11/05/2023 103  98 - 111 mmol/L Final   CO2 11/05/2023 25  22 - 32 mmol/L Final   Glucose, Bld 11/05/2023 90  70 - 99 mg/dL Final   Glucose reference range applies only to samples taken after fasting for at least 8 hours.   BUN 11/05/2023 8  6 - 20 mg/dL Final   Creatinine, Ser 11/05/2023 0.88  0.61 - 1.24 mg/dL Final   Calcium 16/04/9603 9.2  8.9 - 10.3 mg/dL Final   Total Protein 54/03/8118 7.5  6.5 - 8.1 g/dL Final   Albumin 14/78/2956 4.2  3.5 - 5.0 g/dL Final   AST 21/30/8657 26  15 - 41 U/L Final   ALT 11/05/2023 39  0 - 44 U/L Final   Alkaline Phosphatase 11/05/2023 61  38 - 126 U/L Final   Total Bilirubin 11/05/2023 0.8  0.0 - 1.2 mg/dL Final   GFR, Estimated 11/05/2023 >60  >60 mL/min Final   Comment: (NOTE) Calculated using the CKD-EPI Creatinine Equation (2021)    Anion gap 11/05/2023 9  5 - 15 Final   Performed at [facility], [address], [place], [place] [address]   Alcohol, Ethyl (B) 11/05/2023 <15  <15 mg/dL Final   Comment: Please note change in reference range. (NOTE) For medical purposes only. Performed at [facility], [address], [place], [place] [address]    Opiates 11/05/2023 NONE DETECTED  NONE DETECTED Final   Cocaine 11/05/2023 NONE DETECTED  NONE DETECTED Final   Benzodiazepines 11/05/2023 NONE DETECTED  NONE DETECTED Final   Amphetamines 11/05/2023 POSITIVE (A)  NONE DETECTED Final   Comment: (NOTE) Trazodone  is metabolized in vivo to several metabolites, including pharmacologically active m-CPP, which is excreted in the urine. Immunoassay screens for amphetamines and MDMA have potential cross-reactivity with these compounds and may provide false positive  results.     Tetrahydrocannabinol 11/05/2023 NONE DETECTED  NONE DETECTED Final   Barbiturates 11/05/2023 NONE DETECTED  NONE DETECTED Final   Comment: (NOTE) DRUG SCREEN FOR MEDICAL  PURPOSES ONLY.  IF CONFIRMATION IS NEEDED FOR ANY PURPOSE, NOTIFY LAB WITHIN 5 DAYS.  LOWEST DETECTABLE LIMITS FOR URINE DRUG SCREEN Drug Class                     Cutoff (ng/mL) Amphetamine and metabolites    1000 Barbiturate and metabolites    200 Benzodiazepine                 200 Opiates and metabolites        300 Cocaine and metabolites        300 THC  50 Performed at [facility], [address], [place], [place] [address]    WBC 11/05/2023 6.9  4.0 - 10.5 K/uL Final   RBC 11/05/2023 5.30  4.22 - 5.81 MIL/uL Final   Hemoglobin 11/05/2023 14.0  13.0 - 17.0 g/dL Final   HCT 91/47/8295 44.1  39.0 - 52.0 % Final   MCV 11/05/2023 83.2  80.0 - 100.0 fL Final   MCH 11/05/2023 26.4  26.0 - 34.0 pg Final   MCHC 11/05/2023 31.7  30.0 - 36.0 g/dL Final   RDW 62/13/0865 13.3  11.5 - 15.5 % Final   Platelets 11/05/2023 255  150 - 400 K/uL Final   nRBC 11/05/2023 0.0  0.0 - 0.2 % Final   Neutrophils Relative % 11/05/2023 79  % Final   Neutro Abs 11/05/2023 5.5  1.7 - 7.7 K/uL Final   Lymphocytes Relative 11/05/2023 12  % Final   Lymphs Abs 11/05/2023 0.8  0.7 - 4.0 K/uL Final   Monocytes Relative 11/05/2023 8  % Final   Monocytes Absolute 11/05/2023 0.5  0.1 - 1.0 K/uL Final   Eosinophils Relative 11/05/2023 1  % Final   Eosinophils Absolute 11/05/2023 0.1  0.0 - 0.5 K/uL Final   Basophils Relative 11/05/2023 0  % Final   Basophils Absolute 11/05/2023 0.0  0.0 - 0.1 K/uL Final   Immature Granulocytes 11/05/2023 0  % Final   Abs Immature Granulocytes 11/05/2023 0.02  0.00 - 0.07 K/uL Final   Performed at [facility], [address], [place], [place] [address]  Admission on 09/01/2023, Discharged on 09/01/2023  Component Date Value Ref Range Status   SARS Coronavirus 2 by RT PCR 09/01/2023 NEGATIVE  NEGATIVE Final   Comment: (NOTE) SARS-CoV-2 target nucleic acids are NOT DETECTED.  The SARS-CoV-2 RNA is generally  detectable in upper respiratory specimens during the acute phase of infection. The lowest concentration of SARS-CoV-2 viral copies this assay can detect is 138 copies/mL. A negative result does not preclude SARS-Cov-2 infection and should not be used as the sole basis for treatment or other patient management decisions. A negative result may occur with  improper specimen collection/handling, submission of specimen other than nasopharyngeal swab, presence of viral mutation(s) within the areas targeted by this assay, and inadequate number of viral copies(<138 copies/mL). A negative result must be combined with clinical observations, patient history, and epidemiological information. The expected result is Negative.  Fact Sheet for Patients:  BloggerCourse.com  Fact Sheet for Healthcare Providers:  SeriousBroker.it  This test is no                          t yet approved or cleared by the [place]  FDA and  has been authorized for detection and/or diagnosis of SARS-CoV-2 by FDA under an Emergency Use Authorization (EUA). This EUA will remain  in effect (meaning this test can be used) for the duration of the COVID-19 declaration under Section 564(b)(1) of the Act, 21 U.S.C.section 360bbb-3(b)(1), unless the authorization is terminated  or revoked sooner.       Influenza A by PCR 09/01/2023 NEGATIVE  NEGATIVE Final   Influenza B by PCR 09/01/2023 NEGATIVE  NEGATIVE Final   Comment: (NOTE) The Xpert Xpress SARS-CoV-2/FLU/RSV plus assay is intended as an aid in the diagnosis of influenza from Nasopharyngeal swab specimens and should not be used as a sole basis for treatment. Nasal washings and aspirates are unacceptable for Xpert Xpress SARS-CoV-2/FLU/RSV testing.  Fact Sheet for Patients: BloggerCourse.com  Fact Sheet for Healthcare Providers: SeriousBroker.it  This test is not  yet approved or cleared by the [place]  FDA and has been authorized for detection and/or diagnosis of SARS-CoV-2 by FDA under an Emergency Use Authorization (EUA). This EUA will remain in effect (meaning this test can be used) for the duration of the COVID-19 declaration under Section 564(b)(1) of the Act, 21 U.S.C. section 360bbb-3(b)(1), unless the authorization is terminated or revoked.     Resp Syncytial Virus by PCR 09/01/2023 NEGATIVE  NEGATIVE Final   Comment: (NOTE) Fact Sheet for Patients: BloggerCourse.com  Fact Sheet for Healthcare Providers: SeriousBroker.it  This test is not yet approved or cleared by the [place]  FDA and has been authorized for detection and/or diagnosis of SARS-CoV-2 by FDA under an Emergency Use Authorization (EUA). This EUA will remain in effect (meaning this test can be used) for the duration of the COVID-19 declaration under Section 564(b)(1) of the Act, 21 U.S.C. section 360bbb-3(b)(1), unless the authorization is terminated or revoked.  Performed at [facility], [address], [place], [place] [address]   Admission on 08/22/2023, Discharged on 08/22/2023  Component Date Value Ref Range Status   SARS Coronavirus 2 by RT PCR 08/22/2023 NEGATIVE  NEGATIVE Final   Comment: (NOTE) SARS-CoV-2 target nucleic acids are NOT DETECTED.  The SARS-CoV-2 RNA is generally detectable in upper respiratory specimens during the acute phase of infection. The lowest concentration of SARS-CoV-2 viral copies this assay can detect is 138 copies/mL. A negative result does not preclude SARS-Cov-2 infection and should not be used as the sole basis for treatment or other patient management decisions. A negative result may occur with  improper specimen collection/handling, submission of specimen other than nasopharyngeal swab, presence of viral mutation(s) within the areas targeted by  this assay, and inadequate number of viral copies(<138 copies/mL). A negative result must be combined with clinical observations, patient history, and epidemiological information. The expected result is Negative.  Fact Sheet for Patients:  BloggerCourse.com  Fact Sheet for Healthcare Providers:  SeriousBroker.it  This test is no                          t yet approved or cleared by the [place]  FDA and  has been authorized for detection and/or diagnosis of SARS-CoV-2 by FDA under an Emergency Use Authorization (EUA). This EUA will remain  in effect (meaning this test can be used) for the duration of the COVID-19 declaration under Section 564(b)(1) of the Act, 21 U.S.C.section 360bbb-3(b)(1), unless the authorization is terminated  or revoked sooner.       Influenza A by PCR 08/22/2023 NEGATIVE  NEGATIVE Final   Influenza B by PCR 08/22/2023 NEGATIVE  NEGATIVE Final   Comment: (NOTE) The Xpert Xpress SARS-CoV-2/FLU/RSV plus assay is intended as an aid in the diagnosis of influenza from Nasopharyngeal swab specimens and should not be used as a sole basis for treatment. Nasal washings and aspirates are unacceptable for Xpert Xpress SARS-CoV-2/FLU/RSV testing.  Fact Sheet for Patients: BloggerCourse.com  Fact Sheet for Healthcare Providers: SeriousBroker.it  This test is not yet approved or cleared by the [place]  FDA and has been authorized for detection and/or diagnosis of SARS-CoV-2 by FDA under an Emergency Use Authorization (EUA). This EUA will remain in effect (meaning this test can be used) for the duration of the COVID-19 declaration under Section 564(b)(1) of the Act, 21 U.S.C. section 360bbb-3(b)(1),  unless the authorization is terminated or revoked.     Resp Syncytial Virus by PCR 08/22/2023 NEGATIVE  NEGATIVE Final   Comment: (NOTE) Fact Sheet for  Patients: BloggerCourse.com  Fact Sheet for Healthcare Providers: SeriousBroker.it  This test is not yet approved or cleared by the [place]  FDA and has been authorized for detection and/or diagnosis of SARS-CoV-2 by FDA under an Emergency Use Authorization (EUA). This EUA will remain in effect (meaning this test can be used) for the duration of the COVID-19 declaration under Section 564(b)(1) of the Act, 21 U.S.C. section 360bbb-3(b)(1), unless the authorization is terminated or revoked.  Performed at [facility], [address] [place], [place] [address]    Color, Urine 08/22/2023 YELLOW  YELLOW Final   APPearance 08/22/2023 CLEAR  CLEAR Final   Specific Gravity, Urine 08/22/2023 1.016  1.005 - 1.030 Final   pH 08/22/2023 6.0  5.0 - 8.0 Final   Glucose, UA 08/22/2023 NEGATIVE  NEGATIVE mg/dL Final   Hgb urine dipstick 08/22/2023 NEGATIVE  NEGATIVE Final   Bilirubin Urine 08/22/2023 NEGATIVE  NEGATIVE Final   Ketones, ur 08/22/2023 NEGATIVE  NEGATIVE mg/dL Final   Protein, ur 69/62/9528 NEGATIVE  NEGATIVE mg/dL Final   Nitrite 41/32/4401 NEGATIVE  NEGATIVE Final   Leukocytes,Ua 08/22/2023 NEGATIVE  NEGATIVE Final   Performed at [facility], [address] [place], [place] [address]  Admission on 08/04/2023, Discharged on 08/06/2023  Component Date Value Ref Range Status   Sodium 08/04/2023 137  135 - 145 mmol/L Final   Potassium 08/04/2023 3.6  3.5 - 5.1 mmol/L Final   Chloride 08/04/2023 105  98 - 111 mmol/L Final   CO2 08/04/2023 22  22 - 32 mmol/L Final   Glucose, Bld 08/04/2023 79  70 - 99 mg/dL Final   Glucose reference range applies only to samples taken after fasting for at least 8 hours.   BUN 08/04/2023 12  6 - 20 mg/dL Final   Creatinine, Ser 08/04/2023 0.76  0.61 - 1.24 mg/dL Final   Calcium 36/64/4034 9.4  8.9 - 10.3 mg/dL Final   Total Protein 74/25/9563 7.3  6.5 - 8.1  g/dL Final   Albumin 87/56/4332 4.1  3.5 - 5.0 g/dL Final   AST 95/18/8416 31  15 - 41 U/L Final   ALT 08/04/2023 42  0 - 44 U/L Final   Alkaline Phosphatase 08/04/2023 60  38 - 126 U/L Final   Total Bilirubin 08/04/2023 0.5  0.0 - 1.2 mg/dL Final   GFR, Estimated 08/04/2023 >60  >60 mL/min Final   Comment: (NOTE) Calculated using the CKD-EPI Creatinine Equation (2021)    Anion gap 08/04/2023 10  5 - 15 Final   Performed at [facility], [address] [place], [place] [address]   Alcohol, Ethyl (B) 08/04/2023 <10  <10 mg/dL Final   Comment: (NOTE) Lowest detectable limit for serum alcohol is 10 mg/dL.  For medical purposes only. Performed at [facility], [address] [place], [place] [address]    Opiates 08/04/2023 NONE DETECTED  NONE DETECTED Final   Cocaine 08/04/2023 NONE DETECTED  NONE DETECTED Final   Benzodiazepines 08/04/2023 NONE DETECTED  NONE DETECTED Final   Amphetamines 08/04/2023 POSITIVE (A)  NONE DETECTED Final   Tetrahydrocannabinol 08/04/2023 NONE DETECTED  NONE DETECTED Final   Barbiturates 08/04/2023 NONE DETECTED  NONE DETECTED Final   Comment: (NOTE) DRUG SCREEN FOR MEDICAL PURPOSES ONLY.  IF CONFIRMATION IS NEEDED FOR ANY PURPOSE,  NOTIFY LAB WITHIN 5 DAYS.  LOWEST DETECTABLE LIMITS FOR URINE DRUG SCREEN Drug Class                     Cutoff (ng/mL) Amphetamine and metabolites    1000 Barbiturate and metabolites    200 Benzodiazepine                 200 Opiates and metabolites        300 Cocaine and metabolites        300 THC                            50 Performed at [facility], [address], [place], [place] [address]    WBC 08/04/2023 6.8  4.0 - 10.5 K/uL Final   RBC 08/04/2023 4.90  4.22 - 5.81 MIL/uL Final   Hemoglobin 08/04/2023 13.0  13.0 - 17.0 g/dL Final   HCT 95/63/8756 41.1  39.0 - 52.0 % Final   MCV 08/04/2023 83.9  80.0 - 100.0 fL Final   MCH 08/04/2023 26.5  26.0 -  34.0 pg Final   MCHC 08/04/2023 31.6  30.0 - 36.0 g/dL Final   RDW 43/32/9518 13.3  11.5 - 15.5 % Final   Platelets 08/04/2023 273  150 - 400 K/uL Final   nRBC 08/04/2023 0.0  0.0 - 0.2 % Final   Neutrophils Relative % 08/04/2023 76  % Final   Neutro Abs 08/04/2023 5.2  1.7 - 7.7 K/uL Final   Lymphocytes Relative 08/04/2023 16  % Final   Lymphs Abs 08/04/2023 1.1  0.7 - 4.0 K/uL Final   Monocytes Relative 08/04/2023 8  % Final   Monocytes Absolute 08/04/2023 0.5  0.1 - 1.0 K/uL Final   Eosinophils Relative 08/04/2023 0  % Final   Eosinophils Absolute 08/04/2023 0.0  0.0 - 0.5 K/uL Final   Basophils Relative 08/04/2023 0  % Final   Basophils Absolute 08/04/2023 0.0  0.0 - 0.1 K/uL Final   Immature Granulocytes 08/04/2023 0  % Final   Abs Immature Granulocytes 08/04/2023 0.01  0.00 - 0.07 K/uL Final   Performed at [facility], [address] [place], [place] [address]   SARS Coronavirus 2 by RT PCR 08/05/2023 NEGATIVE  NEGATIVE Final   Comment: (NOTE) SARS-CoV-2 target nucleic acids are NOT DETECTED.  The SARS-CoV-2 RNA is generally detectable in upper and lower respiratory specimens during the acute phase of infection. The lowest concentration of SARS-CoV-2 viral copies this assay can detect is 250 copies / mL. A negative result does not preclude SARS-CoV-2 infection and should not be used as the sole basis for treatment or other patient management decisions.  A negative result may occur with improper specimen collection / handling, submission of specimen other than nasopharyngeal swab, presence of viral mutation(s) within the areas targeted by this assay, and inadequate number of viral copies (<250 copies / mL). A negative result must be combined with clinical observations, patient history, and epidemiological information.  Fact Sheet for Patients:   RoadLapTop.co.za  Fact Sheet for Healthcare  Providers: http://kim-miller.com/  This test is not yet approved or                           cleared by the [place]  FDA and has been authorized for detection and/or diagnosis of SARS-CoV-2 by FDA under an Emergency Use Authorization (EUA).  This EUA will remain in effect (meaning this test can be used) for the duration of the COVID-19 declaration under Section 564(b)(1) of the Act, 21 U.S.C. section 360bbb-3(b)(1), unless the authorization is terminated or revoked sooner.  Performed at [facility], [address] [place], [place] [address]     Blood Alcohol level:  Lab Results  Component Value Date   [facility] <15 12/17/2023   ETH <15 11/05/2023    Metabolic Disorder Labs: Lab Results  Component Value Date   HGBA1C 5.2 12/17/2023   MPG 102.54 12/17/2023   MPG 117 10/07/2022   Lab Results  Component Value Date   PROLACTIN 17.4 10/05/2022   PROLACTIN 45.8 (H) 12/19/2017   Lab Results  Component Value Date   CHOL 156 10/05/2022   TRIG 116 10/05/2022   HDL 40 (L) 10/05/2022   CHOLHDL 3.9 10/05/2022   VLDL 23 10/05/2022   LDLCALC 93 10/05/2022   LDLCALC 69 11/21/2020    Therapeutic Lab Levels: Lab Results  Component Value Date   LITHIUM  0.22 (L) 03/13/2023   LITHIUM  <0.06 (L) 03/09/2023   Lab Results  Component Value Date   VALPROATE 73 04/02/2023   VALPROATE 47 (L) 03/31/2023   Lab Results  Component Value Date   CBMZ 8.0 11/25/2020    Physical Findings   AIMS    Flowsheet Row [facility]) [facility] Ad[facility] (Discharged) [facility] Admission (Discharged) from OP Visit [facility] Admission (Discharged) from OP Visit from 01/17/2019 [facility] Ad[facility] (Discharged) [facility]   AIMS Total Score 0 0 0 0 0   AUDIT    Flowsheet Row Ad[facility] ([facility]) [facility] Admission (Discharged) [facility] Admission (Discharged) from OP Visit from 01/17/2019 [facility] Admission (Discharged) [facility] Ad[facility] (Discharged) [facility]  Alcohol Use Disorder Identification Test Final Score (AUDIT) 0 0 0 0 0   PHQ2-9    Flowsheet Row ED from 12/16/2023 in [facility]  PHQ-2 Total Score 0  PHQ-9 Total Score 10   Flowsheet Row ED from 12/16/2023 in [facility] ED from 11/05/2023 in [facility] ED from 10/21/2023 in [facility]  C-SSRS RISK CATEGORY No Risk No Risk No Risk     Musculoskeletal  Strength & Muscle Tone: within normal limits Gait & Station: normal Patient leans: N/A  Psychiatric Specialty Exam  Presentation  General Appearance:  Disheveled  Eye Contact: Minimal  Speech: Clear and Coherent  Speech Volume: Decreased  Handedness: Right   Mood and Affect  Mood: Dysphoric  Affect: Congruent   Thought Process  Thought Processes: Linear  Descriptions of Associations:Intact  Orientation:Full (Time, Place and Person)  Thought Content:Logical  Diagnosis of Schizophrenia or Schizoaffective disorder in past: Yes  Duration of Psychotic Symptoms: Greater than six months   Hallucinations:Hallucinations: None  Ideas of Reference:None  Suicidal Thoughts:Suicidal Thoughts: No  Homicidal Thoughts:Homicidal Thoughts: No   Sensorium  Memory: Immediate Fair; Recent Fair; Remote Fair  Judgment: Intact  Insight: Present   Executive Functions  Concentration: Poor  Attention  Span: Poor  Recall: Poor  [employer]  of Knowledge: Poor  Language: Poor   Psychomotor Activity  Psychomotor Activity: Psychomotor Activity: Restlessness   Assets  Assets: Communication Skills; Physical Health; Leisure Time   Sleep  Sleep: Sleep: Fair  No Safety Checks orders active in given range  Nutritional Assessment (For OBS and [facility]BC admissions only) Has the patient had a weight loss or gain of 10 pounds or more in the last 3 months?: No Has the patient had a decrease in food intake/or appetite?: No Does the patient have dental problems?: No Has the patient recently lost weight without trying?: 0 Has the patient been eating poorly because of a decreased appetite?: 0 Malnutrition Screening Tool Score: 0    Physical Exam  Physical Exam  Cardiovascular:     Rate and Rhythm: Normal rate.  Pulmonary:     Effort: Pulmonary effort is normal.   Musculoskeletal:        General: Normal range of motion.     Cervical back: Normal range of motion.   Neurological:     Mental Status: He is alert and oriented to person, place, and time.    Review of Systems  Constitutional: Negative.   HENT: Negative.    Eyes: Negative.   Respiratory: Negative.    Cardiovascular: Negative.   Gastrointestinal: Negative.   Genitourinary: Negative.   Musculoskeletal: Negative.   Neurological: Negative.   Endo/Heme/Allergies: Negative.    Blood pressure 123/86, pulse 89, temperature 98 F (36.7 C), temperature source Oral, resp. rate 18, SpO2 95%. There is no height or weight on file to calculate BMI.  Plan Of Care/Follow-up recommendations:  Patient is recommended for inpatient psychiatric treatment for mood stabilization and medication management. Patient remains under IVC. CSW to fax out to seek appropriate placement.   Medication regimen Continue Zyprexa  10 mg po at bedtime  Labs reviewed.  UDS positive for amphetamine, benzo (given at facility), cocaine and methamphetamine.   BAL negative AST slightly elevated 50  Disposition: Pt has been accepted to [facility] on 12/17/2023  Bed assignment: Main campus. Attending Physician will be Lavona Pounds, MD  Gwenlyn Lento, NP 12/17/2023 10:37 AM

## 2023-12-17 NOTE — ED Notes (Signed)
 Pt sleeping at present, no distress noted.  Monitoring for safety.

## 2023-12-17 NOTE — ED Notes (Signed)
 Patient becoming irritable, wanting to leave. PRN medication given.  Plan of care ongoing, no further concerns as of present. Patient expresses no other needs at this time.

## 2023-12-17 NOTE — Discharge Instructions (Addendum)
 Pt has been accepted to [facility] on 12/21/2023  Bed assignment: Main campus

## 2023-12-17 NOTE — ED Notes (Signed)

## 2023-12-17 NOTE — ED Notes (Signed)
 Pt awake, alert &responsive, no distress noted, resting at present, calm at present.  Monitoring for safety.

## 2023-12-18 DIAGNOSIS — F259 Schizoaffective disorder, unspecified: Secondary | ICD-10-CM | POA: Diagnosis not present

## 2023-12-18 MED ORDER — OLANZAPINE 5 MG PO TABS
5.0000 mg | ORAL_TABLET | Freq: Every day | ORAL | Status: AC
Start: 2023-12-19 — End: ?
  Administered 2023-12-19 – 2023-12-21 (×3): 5 mg via ORAL
  Filled 2023-12-18 (×3): qty 1

## 2023-12-18 MED ORDER — OLANZAPINE 5 MG PO TBDP
5.0000 mg | ORAL_TABLET | Freq: Once | ORAL | Status: AC
  Administered 2023-12-18: 5 mg via ORAL
  Filled 2023-12-18: qty 1

## 2023-12-18 MED ORDER — LORAZEPAM 1 MG PO TABS
2.0000 mg | ORAL_TABLET | Freq: Three times a day (TID) | ORAL | Status: DC | PRN
  Administered 2023-12-18 – 2023-12-20 (×3): 2 mg via ORAL
  Filled 2023-12-18 (×3): qty 2

## 2023-12-18 NOTE — ED Notes (Signed)
 Patient yelling at the wall, hitting wall stating they're trying to kill me. Able to reorient and not aggressive towards staff; agreeable to taking PO PRN medication. Plan of care ongoing, no further concerns as of present. Patient expresses no other needs at this time.

## 2023-12-18 NOTE — ED Notes (Signed)
 Patient resting with eyes closed. Respirations even and unlabored. No distress noted. Environment secured. Plan of care ongoing, no further concerns as of present.

## 2023-12-18 NOTE — ED Notes (Signed)
 Patient pacing the unit and becoming boisterous. PRN medication administered. Plan of care ongoing, no further concerns as of present. Patient expresses no other needs at this time.

## 2023-12-18 NOTE — ED Provider Notes (Signed)
 [name] [name] is a 43 year old male patient with a past psychiatric history significant for schizophrenia, schizoaffective disorder, methamphetamine use disorder, cocaine abuse, insomnia, cannabis use disorder who is currently under involuntary commitment for acute psychosis as evidenced by disorganized thoughts, paranoia and delusional thought content. Patient was accepted to [facility] on 12/17/23 and is currently awaiting transportation from sheriff department. On approach today, patient is lying down in bed in no acute distress. He tells me that his name is not Anthony and that his name is [name]. He states that he is trying to go to   to be around his real family. He states that I am trying to kill him. He states that he is not telling me shit because I am trying to plot against him.   Patient administered Zyprexa  5 mg po once this morning for psychosis. He is currently prescribed Zyprexa  10 mg at bedtime. Will increase Zyprexa  to 5 mg po in morning and continue 10 mg at bedtime.

## 2023-12-18 NOTE — Progress Notes (Signed)
 Update: [facility] unable to hold bed.    Inpatient Psychiatric Referral  Patient was recommended inpatient per Nickola Baron, NP. There are no available beds at [facility], per [facility] Bevin Bucks, RN. Patient was referred to the following out of network facilities:  Destination  Service Provider Request Status Address Phone Fax  [facility]  Pending - Request Sent [address] [place], [place] [address] [phone]  [facility]  Pending - Request Sent [address] [place] [address] [phone]  [facility]  Pending - Request Sent [address] [place] [address] [phone]  [facility]  Pending - Request Sent [address] Shelva Dice [place] [address] [phone]  [facility]  Pending - Request Sent [address], [place] [address] [phone]  [facility]  Pending - Request Sent [address] Sharren Decree [place] [phone]  [facility]  Pending - Request Sent [address] [place] [address] [phone]   Situation ongoing, CSW to continue following and update chart as more information becomes available.   Albertus Alt MSW, LCSWA 12/18/2023  5:34PM

## 2023-12-18 NOTE — ED Notes (Signed)
 Patient assessed lying in bed. Patient observed speaking to himself incoherently. Patient verbalizes no complaints at this time. Appears in no immediate distress. Continuing to monitor.

## 2023-12-18 NOTE — ED Notes (Signed)
 Sheriffs Transport to [facility] requested this am after 8am.  Pt is IVC.

## 2023-12-18 NOTE — ED Notes (Signed)
 Patient observed/assessed in bed/chair resting quietly appearing in no distress and verbalizing no complaints at this time. Will continue to monitor.

## 2023-12-18 NOTE — ED Notes (Signed)
 Pt sleeping at present, no distress noted.  Monitoring for safety.  Pending transfer to [facility] this am.

## 2023-12-19 DIAGNOSIS — F259 Schizoaffective disorder, unspecified: Secondary | ICD-10-CM | POA: Diagnosis not present

## 2023-12-19 NOTE — ED Notes (Signed)
 Pt asking when he would be able to leave.  Informed patient that he is not ready for discharge at present.   Asked pt what lead him to coming to [facility] he said nothing, I just need my bike and my tools. Assured patient his belongings are being taken care of.  Pt provided with snacks.   Pt then stated he was tired.  Encouraged patient to rest. Pt compliant with schedule zyprexa 

## 2023-12-19 NOTE — ED Notes (Signed)
 Pt states he is ok. His mind is ok, his body is ok, and he is not taking drugs.  He is ready to go home

## 2023-12-19 NOTE — ED Notes (Signed)
 Patient continues to respond stating that Harry is not his name. Patient requested to listen to music which was played for guest. Patient then believed and notified staff that he had been the one that wrote the song and inquired if we liked it. Patient observed calmly walking about the unit. Verbalizes no complaints at this time and displays no immediate distress. Continuing to monitor.

## 2023-12-19 NOTE — ED Provider Notes (Signed)
 [name] [name] is a 43 year old male patient with a past psychiatric history significant for schizophrenia, schizoaffective disorder, methamphetamine use disorder, cocaine abuse, insomnia, cannabis use disorder who is currently under involuntary commitment for acute psychosis as evidenced by disorganized thoughts, paranoia and delusional thought content. Patient was accepted to [facility] on 12/17/23 and is currently awaiting transportation from sheriff department. However, patient lost bed at [facility] due to lack of transportation. Sheriff agreeable to transporting patient today before 1 pm if placement is secured. Will defer to the disposition social worker for appropriate placement.  On evaluation today, patient lying down in bed in no acute distress. He is alert and oriented to person, place ([place]), time (June ) and president. His thought process appears less disorganized today on exam. However, I am unsure if he's still experiencing paranoia and delusional thoughts content as he only answered using yes or no responses. Patient denies SI/HI/AVH. He denies difficulty sleeping or eating. He denies pain. He denies medication side effects. Patient has been compliant with taking scheduled medications. He appears less agitated and was cooperative on exam.   Current medication regimen Zyprexa  5 mg po in am for psychosis  Zyprexa  10 mg po at bedtime for psychosis

## 2023-12-19 NOTE — ED Notes (Signed)
 Pt woke and requesting breakfast.  Calm and subdued at present.  No needs identified at present.  Hourly observations continue for safety

## 2023-12-19 NOTE — ED Notes (Signed)
 Patient observed/assessed in bed/chair resting quietly appearing in no distress and verbalizing no complaints at this time. Will continue to monitor.

## 2023-12-19 NOTE — ED Notes (Signed)
 Labile mood.  Yelling and cursing.  Verbal insults.   Banging on area in front of nurses station.   Demanding to leave.  Argumentative.  Difficult to redirect.  Disruption of milieu, At 1058, pt received haldol  5 mg po. Ativan  2 mg po and benadryl  50 mg po. Plan of care ongoing

## 2023-12-19 NOTE — ED Notes (Signed)
 Pt thoughts more connected.  Decrease in amount of paranoid statements made as shift progressed

## 2023-12-19 NOTE — ED Notes (Signed)
 Pt continues to be focused on discharge.  Yelling out many times that he wants to go home. No bed placement information at this time.   Hourly rounds continue for safety.

## 2023-12-19 NOTE — Progress Notes (Signed)
 CSW attempted to contact [facility] via phone but there was no answer. CSW was unable to LVM. CSW will continue to make contact to follow up on previous acceptance.    Phares Brasher, MSW, LCSW-A  9:47 AM 12/19/2023

## 2023-12-20 DIAGNOSIS — F209 Schizophrenia, unspecified: Secondary | ICD-10-CM | POA: Diagnosis not present

## 2023-12-20 DIAGNOSIS — F259 Schizoaffective disorder, unspecified: Secondary | ICD-10-CM | POA: Diagnosis not present

## 2023-12-20 MED ORDER — OLANZAPINE 10 MG PO TABS: 10.0000 mg | ORAL_TABLET | Freq: Every day | ORAL | Status: AC

## 2023-12-20 MED ORDER — OLANZAPINE 5 MG PO TABS: 5.0000 mg | ORAL_TABLET | Freq: Every day | ORAL | Status: AC

## 2023-12-20 NOTE — ED Notes (Signed)
 Patient continues to randomly loudly and aggressively yell out at staff to let me the hell out of here! Attempts have been made to verballyl de escalate, redirect, and distract without success. Patient's behaviors continue to disturb the rest of the [place] with his outbursts. Patient given Haldol  and bendadryl PO PRN for agitation. Continuing to monitor.

## 2023-12-20 NOTE — ED Notes (Signed)
 Pt is focused on discharge and wanting to speak with provider. Provider spoke with patient this AM.  Pt is easily redirectable.  Hourly observations continue for safety

## 2023-12-20 NOTE — ED Notes (Signed)
 Pt has been placed on transport list for transfer to [place] in the AM
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
## 2023-12-20 NOTE — ED Notes (Signed)
 Pt has had no outbursts the 2nd half of shift.  Calm and cooperative.  Pt noted to be singing to music that he asked to hear.   Hourly observations continue for safety

## 2023-12-20 NOTE — ED Notes (Signed)
 Pt presents sitting on recliner.  Calm and cooperative.  Behavior/mood noted to be improving since yesterday day shift.  Pt thoughts are much more connected/clear.   Pt requesting to see provider.  He stated he doesn't think he needs inpatient at this point.  No outbursts thus far this shift. Hourly observations continue for safety

## 2023-12-20 NOTE — Progress Notes (Signed)
 CSW received a call from [facility] sharing that they can accept pt for tomorrow 12/21/2023.    Pt has been accepted to [facility] on 12/21/2023 Bed assignment: Main campus  Pt meets inpatient criteria per Nickola Baron, NP  Attending Physician will be Lavona Pounds, MD  Report can be called to: [phone]   Pt can arrive after 8 AM  Care Team Notified:  Walterine Gunther, RN, Sarahann Cumins

## 2023-12-20 NOTE — Progress Notes (Signed)
 Inpatient Psychiatric Referral  Patient was recommended inpatient per Nickola Baron, NP . There are no available beds at [facility], per [facility] Bevin Bucks, RN . Patient was referred to the following out of network facilities:  Destination  Service Provider Request Status Address Phone Fax  [facility]  Pending - Request Sent [address] [place], [place] [address] [phone]  [facility]  Pending - Request Sent [address] [place] [address] [phone]  [facility]  Pending - Request Sent [address], [place] [address] [phone]  [facility]  Pending - Request Sent [address] Shelva Dice [place] [address] [phone]  [facility]  Pending - Request Sent [address] [place] [address] [phone]  [facility]  Pending - Request Sent [address] Sharren Decree [place] [phone]  [facility]  Pending - Request Sent [address] [place] [address] [phone]    Situation ongoing, CSW to continue following and update chart as more information becomes available.   Albertus Alt MSW, LCSWA 12/20/2023  9:32AM

## 2023-12-20 NOTE — ED Notes (Signed)
 Pt observed lying in bed. Eyes closed respirations even and non labored. NAD Hourly observations continue for safety.

## 2023-12-21 DIAGNOSIS — F259 Schizoaffective disorder, unspecified: Secondary | ICD-10-CM | POA: Diagnosis not present

## 2023-12-21 NOTE — ED Notes (Signed)
 Pt A&Ox4, irritable & cooperative and in NAD at this time. Denies SI/HI/AVH. Contracts for safety. Encouragement and support given. Will continue to monitor.

## 2023-12-21 NOTE — ED Notes (Signed)
 Pt sleeping at this time. Rise and fall of chest noted. Pt in NAD at this time. Will continue to monitor.

## 2023-12-21 NOTE — ED Notes (Signed)
 Patient continues to walk along the unit invading the space of staff and patients on the unit. Patient has an overtly loud speech with both staff and patients even while staff is interacting with other patients overwhelming conversations. Paces the unit and ignores directives to stay away from the nursing station while displaying random outbursts of speech. PRN Benadryl  and Haldol  adminstered PO for agitation. Patient did not respond to directives/encouragement of staff, distraction techniques, and supportive listening. Continuing to monitor.

## 2023-12-21 NOTE — ED Notes (Signed)
 Pt transported to [facility] via [facility]. All belongings and paperwork given to sheriff.

## 2023-12-21 NOTE — ED Notes (Signed)
 Patient observed/assessed in bed/chair resting quietly appearing in no distress and verbalizing no complaints at this time. Will continue to monitor.

## 2024-02-23 NOTE — Progress Notes (Signed)
 BEST Call. BEST call. Patient being verbally aggressive in room and hallway, demanding to leave, pacing in the hallway. IM PRN given. BEST RN ensured that primary RN was okay and offered additional assistance if needed. BEST ended.

## 2024-02-24 NOTE — Nursing Note (Addendum)
 Report called to Nathanel Heads, RN at [facility]. All questions and concerns answered at this time.   1614--patient d/c with sheriff transport to [facility]

## 2024-02-24 NOTE — Discharge Summary (Addendum)
 Hospital Medicine Discharge Summary   Demographics: [name] [name]  43 y.o. 1981-05-13 MRN: [id number]    Extended Emergency Contact Information Primary Emergency Contact: Manning,Reggie Work Phone: [phone] Relation: Caregiver  Full Code  Admit Date: 02/20/2024                            Attending Physician: Marcianne DELENA Queen, MD Discharge Date: 02/24/2024  Primary Care Provider: PCP Needed PPI   None  Consults during this admission: Psychiatry  Active & Resolved Diagnosis: Hallucinations Acute psychosis    (CMD)  Disposition: Patient discharged to Inpatient Psychiatry in stable condition.    Hospital Course: 43 year old male with a history of schizophrenia, homelessness admitted 02/20/2024 presenting with acute psychosis and hallucinations.  Assessment & Plan Acute psychosis    (CMD) History of schizophrenia and substance abuse.   Outside documentation shows that patient has been on scheduled Depakote , Haldol , Zyprexa , trazodone , as well as Haldol  decanoate injections in the past.  It seems that most of his prior hospitalizations are in the [place] area.  Pt to be discharged to Inpatient Pyschiatry.  Medically clear for discharge.   Wound / Incision Assessment: Refer to Chart Review and Media Tab for images if available.     Vital Sign Range:  Temp:  [97.2 F (36.2 C)-98 F (36.7 C)] 98 F (36.7 C) Heart Rate:  [67-90] 90 Resp:  [16-18] 18 BP: (113-139)/(67-86) 113/67      Discharge Medications     Modified Medications      Sig Disp Refill Start End  haloperidoL  5 mg tablet Commonly known as: HALDOL  What changed:  medication strength how much to take  Take 1 tablet (5 mg total) by mouth 2 (two) times a day.  60 tablet  0     haloperidol  decanoate 100 mg/mL injection Commonly known as: HALDOL  DECANOATE What changed: additional instructions  Inject 3 mL (300 mg total) into the muscle every 28 days.  9 mL  0  March 02, 2024    traZODone  100  mg tablet Commonly known as: DESYREL  What changed:  medication strength how much to take  Take 1 tablet (100 mg total) by mouth at bedtime.  90 tablet  0         Medications To Continue      Sig Disp Refill Start End  benztropine  1 mg tablet Commonly known as: COGENTIN   Take 1 mg by mouth 2 (two) times a day.   0     divalproex  500 mg 12 hr tablet Commonly known as: DEPAKOTE  DR  Take 1,000 mg by mouth 2 (two) times a day.   0     gabapentin  300 mg capsule Commonly known as: NEURONTIN   Take 300 mg by mouth 3 (three) times a day.   0             Lab Results  Component Value Date/Time   HGB 13.6 02/20/2024 09:45 PM   HCT 40 02/20/2024 09:45 PM   WBC 5.58 02/20/2024 09:45 PM   PLT 275 02/20/2024 09:45 PM   Lab Results  Component Value Date/Time   NA 141 02/20/2024 09:45 PM   K 3.7 02/20/2024 09:45 PM   CREATININE 0.89 02/20/2024 09:45 PM   BUN 10 02/20/2024 09:45 PM   GLUCOSE 103 02/20/2024 09:45 PM    Pertinent Imaging: No orders to display    Electronically signed by: Marcianne DELENA Queen, MD 02/24/2024 1:44 PM  Time spent on discharge: approximately 35 minutes

## 2024-02-24 NOTE — Progress Notes (Signed)
 Nutrition Note   PATIENT NAME:  [name] [name] DATE:  02/24/2024  TIME:  11:48 AM   Note Type: Brief note Referral Reason:  LOS Assessment:  Currently on a Regular diet and averaging 75-100% of documented meals. Labs, GI, Skin reviewed and WNL. No significant weight loss or history of malnutrition noted within the last year per EMR chart review. No acute nutrition intervention needed at this time.  Nutrition Diagnosis:  Nutrition Diagnosis: No nutritional diagnosis at this time       Follow-up Information  Follow-Up Date: 03/02/24 Follow-Up Reason: Re-screen               Vernell Escort, RD 02/24/2024 11:48 AM

## 2024-03-17 ENCOUNTER — Ambulatory Visit (HOSPITAL_COMMUNITY): Admission: EM | Admit: 2024-03-17 | Discharge: 2024-03-18 | Disposition: A | Discharge status: Home / Self Care

## 2024-03-17 DIAGNOSIS — Z5900 Homelessness unspecified: Secondary | ICD-10-CM

## 2024-03-17 NOTE — Progress Notes (Signed)
   03/17/24 2235  [facility] Triage Screening (Walk-ins at [facility] only)  How Did You Hear About Us ? Self  What Is the Reason for Your Visit/Call Today? [facility]ac[facility]ied requesting rest, food and shelter. Pt is currenlty experiencing homelessness. Pt reports diagnosis of Schizophrenia and is not taking prescribed medications at this time. Pt currently denies SI,HI,AVH.  How Long Has This Been Causing You Problems? <Week  Have You Recently Had Any Thoughts About Hurting Yourself? No  Are You Planning to Commit Suicide/Harm Yourself At This time? No  Have you Recently Had Thoughts About Hurting Someone Sherral? No  Are You Planning To Harm Someone At This Time? No  Physical Abuse Denies  Verbal Abuse Denies  Sexual Abuse Denies  Exploitation of patient/patient's resources Denies  Self-Neglect Denies  Are you currently experiencing any auditory, visual or other hallucinations? No  Have You Used Any Alcohol or Drugs in the Past 24 Hours? Yes  What Did You Use and How Much? crack yesterday (unknown amount)  Do you have any current medical co-morbidities that require immediate attention? No  Clinician description of patient physical appearance/behavior: unkempt, delusional thoughts, cooperative  What Do You Feel Would Help You the Most Today? Food Assistance;Housing Assistance;Social Support;Medication(s)  If access to [facility] was not available, would you have sought care in the Emergency Department? No  Determination of Need Routine (7 days)  Options For Referral Other: Comment;Outpatient Therapy;Medication Management

## 2024-03-18 NOTE — ED Provider Notes (Signed)
 [facility] Medical Screening Exam  Patient Name: [name] [name] MRN: [id number] Date of Evaluation: 03/18/24 Chief Complaint:   Diagnosis:  Final diagnoses:  Homelessness    History of Present illness: [name] [name] is a 43 y.o. male with a documented history of schizophrenia and homelessness who presented to [facility] seeking shelter.    Patient seen face to face and his chart was reviewed by this NP. On exam, he reports feeling sleepy and hungry and states that he is primarily looking for a place to rest. He denies any acute psychiatric or medical concerns at this time. Patient is unable to identify an outpatient provider or therapist. He denies suicidal ideation, homicidal ideation, auditory or visual hallucinations, and paranoia.   Partient is alert and oriented x4, he is  disheveled. He is cooperative though minimally engaged. Speech is normal in tone and volume. Mood is described as "tired,"  affect is  flat. Thought processes are logical and linear. Thought content is without evidence of suicidal or homicidal ideation, hallucinations, or delusional thinking. Insight is limited, and judgment appears fair.     No evidence of imminent danger to self or others at this time. Patient does not meet criteria for psychiatric admission or IVC. Supportive therapy provided about ongoing stressors. Discussed crisis plan, callling [id number] or going to Emergency Dept  Flowsheet Row ED from 03/17/2024 in [facility] ED from 12/16/2023 in [facility] ED from 11/05/2023 in [facility] Emergency Department at [facility]  C-SSRS RISK CATEGORY No Risk No Risk No Risk    Psychiatric Specialty Exam  Presentation  General Appearance:Disheveled  Eye Contact:Minimal  Speech:Slow  Speech Volume:Decreased  Handedness:Right   Mood and Affect  Mood: Dysphoric  Affect: Flat   Thought Process  Thought  Processes: Coherent  Descriptions of Associations:Intact  Orientation:Full (Time, Place and Person)  Thought Content:WDL  Diagnosis of Schizophrenia or Schizoaffective disorder in past: Yes  Duration of Psychotic Symptoms: Greater than six months  Hallucinations:None hears voices telling him good things  Ideas of Reference:None  Suicidal Thoughts:No  Homicidal Thoughts:No   Sensorium  Memory: Immediate Fair; Recent Fair; Remote Fair  Judgment: Fair  Insight: Fair   Executive Functions  Concentration: Poor  Attention Span: Poor  Recall: Poor  Fund of Knowledge: Fair  Language: Fair   Psychomotor Activity  Psychomotor Activity: Normal   Assets  Assets: Physical Health   Sleep  Sleep: Good  Number of hours:  8   Physical Exam: Physical Exam Vitals and nursing note reviewed.  Constitutional:      General: He is not in acute distress.    Appearance: He is well-developed.  HENT:     Head: Normocephalic and atraumatic.  Eyes:     Conjunctiva/sclera: Conjunctivae normal.  Cardiovascular:     Rate and Rhythm: Normal rate.  Pulmonary:     Effort: Pulmonary effort is normal.  Abdominal:     Palpations: Abdomen is soft.     Tenderness: There is no abdominal tenderness.  Musculoskeletal:        General: Normal range of motion.     Cervical back: Normal range of motion and neck supple.  Skin:    General: Skin is warm.     Capillary Refill: Capillary refill takes less than 2 seconds.  Neurological:     Mental Status: He is alert and oriented to person, place, and time.  Psychiatric:        Attention and  Perception: Attention and perception normal.        Mood and Affect: Mood normal. Affect is flat.        Speech: Speech normal.        Behavior: Behavior normal. Behavior is cooperative.        Thought Content: Thought content normal.    Review of Systems  Constitutional: Negative.   HENT: Negative.    Eyes: Negative.    Respiratory: Negative.    Cardiovascular: Negative.   Gastrointestinal: Negative.   Genitourinary: Negative.   Musculoskeletal: Negative.   Skin: Negative.   Neurological: Negative.   Endo/Heme/Allergies: Negative.   Psychiatric/Behavioral: Negative.     Blood pressure (!) 135/100, pulse 85, temperature 98.9 F (37.2 C), temperature source Oral, resp. rate 20, SpO2 96%. There is no height or weight on file to calculate BMI.  Musculoskeletal: Strength & Muscle Tone: within normal limits Gait & Station: normal Patient leans: Right   [facility] MSE Discharge Disposition for Follow up and Recommendations: Based on my evaluation the patient does not appear to have an emergency medical condition and can be discharged with resources and follow up care in outpatient services for Medication Management and Individual Therapy   Nursing staff provided the patient with food and drink. He was discharged with information regarding area shelters and community resources for ongoing support.  Kathryne DELENA Show, NP 03/18/2024, 8:46 AM
